# Patient Record
Sex: Male | Born: 1940
Health system: Southern US, Community
[De-identification: ages and names within clinical notes are randomized; demographics above are authoritative.]

## PROBLEM LIST (undated history)

## (undated) DIAGNOSIS — M1990 Unspecified osteoarthritis, unspecified site: Secondary | ICD-10-CM

## (undated) DIAGNOSIS — I951 Orthostatic hypotension: Secondary | ICD-10-CM

## (undated) DIAGNOSIS — E119 Type 2 diabetes mellitus without complications: Secondary | ICD-10-CM

## (undated) DIAGNOSIS — J449 Chronic obstructive pulmonary disease, unspecified: Secondary | ICD-10-CM

## (undated) DIAGNOSIS — N189 Chronic kidney disease, unspecified: Secondary | ICD-10-CM

## (undated) DIAGNOSIS — G4730 Sleep apnea, unspecified: Secondary | ICD-10-CM

## (undated) DIAGNOSIS — R1310 Dysphagia, unspecified: Secondary | ICD-10-CM

## (undated) DIAGNOSIS — K219 Gastro-esophageal reflux disease without esophagitis: Secondary | ICD-10-CM

## (undated) DIAGNOSIS — T7840XA Allergy, unspecified, initial encounter: Secondary | ICD-10-CM

## (undated) DIAGNOSIS — M549 Dorsalgia, unspecified: Secondary | ICD-10-CM

## (undated) DIAGNOSIS — K2270 Barrett's esophagus without dysplasia: Secondary | ICD-10-CM

## (undated) DIAGNOSIS — I639 Cerebral infarction, unspecified: Secondary | ICD-10-CM

## (undated) DIAGNOSIS — N200 Calculus of kidney: Secondary | ICD-10-CM

## (undated) DIAGNOSIS — E785 Hyperlipidemia, unspecified: Secondary | ICD-10-CM

## (undated) DIAGNOSIS — N400 Enlarged prostate without lower urinary tract symptoms: Secondary | ICD-10-CM

## (undated) DIAGNOSIS — R7989 Other specified abnormal findings of blood chemistry: Secondary | ICD-10-CM

## (undated) DIAGNOSIS — E538 Deficiency of other specified B group vitamins: Secondary | ICD-10-CM

## (undated) DIAGNOSIS — I10 Essential (primary) hypertension: Secondary | ICD-10-CM

## (undated) DIAGNOSIS — F32A Depression, unspecified: Secondary | ICD-10-CM

## (undated) DIAGNOSIS — Z972 Presence of dental prosthetic device (complete) (partial): Secondary | ICD-10-CM

## (undated) DIAGNOSIS — D649 Anemia, unspecified: Secondary | ICD-10-CM

## (undated) DIAGNOSIS — I00 Rheumatic fever without heart involvement: Secondary | ICD-10-CM

## (undated) DIAGNOSIS — E669 Obesity, unspecified: Secondary | ICD-10-CM

## (undated) DIAGNOSIS — I509 Heart failure, unspecified: Secondary | ICD-10-CM

## (undated) DIAGNOSIS — R011 Cardiac murmur, unspecified: Secondary | ICD-10-CM

## (undated) DIAGNOSIS — R42 Dizziness and giddiness: Secondary | ICD-10-CM

## (undated) DIAGNOSIS — N3281 Overactive bladder: Secondary | ICD-10-CM

## (undated) DIAGNOSIS — K5792 Diverticulitis of intestine, part unspecified, without perforation or abscess without bleeding: Secondary | ICD-10-CM

## (undated) DIAGNOSIS — F329 Major depressive disorder, single episode, unspecified: Secondary | ICD-10-CM

## (undated) DIAGNOSIS — G4710 Hypersomnia, unspecified: Secondary | ICD-10-CM

## (undated) DIAGNOSIS — I2510 Atherosclerotic heart disease of native coronary artery without angina pectoris: Secondary | ICD-10-CM

## (undated) HISTORY — DX: Diverticulitis of intestine, part unspecified, without perforation or abscess without bleeding: K57.92

## (undated) HISTORY — DX: Benign prostatic hyperplasia without lower urinary tract symptoms: N40.0

## (undated) HISTORY — DX: Dorsalgia, unspecified: M54.9

## (undated) HISTORY — PX: ESOPHAGOGASTRODUODENOSCOPY ENDOSCOPY: SHX5814

## (undated) HISTORY — PX: TONSILLECTOMY: SUR1361

## (undated) HISTORY — PX: BACK SURGERY: SHX140

## (undated) HISTORY — PX: CERVICAL SPINE SURGERY: SHX589

## (undated) HISTORY — PX: LUMBAR SPINE SURGERY: SHX701

## (undated) HISTORY — PX: EYE SURGERY: SHX253

## (undated) HISTORY — PX: CAROTID STENT: SHX1301

## (undated) HISTORY — DX: Overactive bladder: N32.81

## (undated) HISTORY — PX: CORONARY ANGIOPLASTY: SHX604

## (undated) HISTORY — PX: COLONOSCOPY: SHX174

## (undated) HISTORY — PX: THORACIC SPINE SURGERY: SHX802

## (undated) HISTORY — DX: Allergy, unspecified, initial encounter: T78.40XA

## (undated) HISTORY — DX: Type 2 diabetes mellitus without complications: E11.9

## (undated) SURGERY — Surgical Case
Anesthesia: *Unknown

---

## 2001-01-08 DIAGNOSIS — Z8719 Personal history of other diseases of the digestive system: Secondary | ICD-10-CM | POA: Insufficient documentation

## 2008-11-12 ENCOUNTER — Ambulatory Visit: Payer: Self-pay | Admitting: Family Medicine

## 2008-11-17 ENCOUNTER — Ambulatory Visit: Payer: Self-pay | Admitting: Family Medicine

## 2008-12-21 ENCOUNTER — Ambulatory Visit: Payer: Self-pay | Admitting: Family Medicine

## 2009-01-08 ENCOUNTER — Ambulatory Visit: Payer: Self-pay | Admitting: Family Medicine

## 2009-02-08 ENCOUNTER — Ambulatory Visit: Payer: Self-pay | Admitting: Family Medicine

## 2009-03-08 ENCOUNTER — Ambulatory Visit: Payer: Self-pay | Admitting: Family Medicine

## 2009-08-08 ENCOUNTER — Ambulatory Visit: Payer: Self-pay | Admitting: Unknown Physician Specialty

## 2009-08-10 LAB — PATHOLOGY REPORT

## 2010-02-06 ENCOUNTER — Encounter: Payer: Self-pay | Admitting: Vascular Surgery

## 2010-02-08 ENCOUNTER — Encounter: Payer: Self-pay | Admitting: Vascular Surgery

## 2010-03-09 ENCOUNTER — Encounter: Payer: Self-pay | Admitting: Vascular Surgery

## 2010-04-14 ENCOUNTER — Emergency Department: Payer: Self-pay | Admitting: Emergency Medicine

## 2010-06-13 ENCOUNTER — Ambulatory Visit: Payer: Self-pay | Admitting: Unknown Physician Specialty

## 2010-06-15 LAB — PATHOLOGY REPORT

## 2010-09-14 ENCOUNTER — Ambulatory Visit: Payer: Self-pay | Admitting: Specialist

## 2010-09-25 ENCOUNTER — Ambulatory Visit: Payer: Self-pay | Admitting: Specialist

## 2010-12-15 ENCOUNTER — Ambulatory Visit: Payer: Self-pay | Admitting: Urology

## 2011-09-24 ENCOUNTER — Ambulatory Visit: Payer: Self-pay | Admitting: Neurology

## 2012-11-28 ENCOUNTER — Ambulatory Visit: Payer: Self-pay | Admitting: Unknown Physician Specialty

## 2013-04-28 ENCOUNTER — Ambulatory Visit: Payer: Self-pay | Admitting: Urology

## 2013-06-18 DIAGNOSIS — G4730 Sleep apnea, unspecified: Secondary | ICD-10-CM | POA: Insufficient documentation

## 2013-07-03 DIAGNOSIS — E785 Hyperlipidemia, unspecified: Secondary | ICD-10-CM | POA: Insufficient documentation

## 2013-07-03 DIAGNOSIS — I10 Essential (primary) hypertension: Secondary | ICD-10-CM | POA: Insufficient documentation

## 2013-07-03 DIAGNOSIS — R0602 Shortness of breath: Secondary | ICD-10-CM | POA: Insufficient documentation

## 2013-07-03 DIAGNOSIS — M1710 Unilateral primary osteoarthritis, unspecified knee: Secondary | ICD-10-CM | POA: Insufficient documentation

## 2013-07-03 DIAGNOSIS — I2510 Atherosclerotic heart disease of native coronary artery without angina pectoris: Secondary | ICD-10-CM | POA: Insufficient documentation

## 2013-07-03 DIAGNOSIS — E1140 Type 2 diabetes mellitus with diabetic neuropathy, unspecified: Secondary | ICD-10-CM | POA: Insufficient documentation

## 2013-07-03 DIAGNOSIS — E119 Type 2 diabetes mellitus without complications: Secondary | ICD-10-CM | POA: Insufficient documentation

## 2013-09-08 ENCOUNTER — Encounter: Payer: Self-pay | Admitting: Neurology

## 2013-11-19 ENCOUNTER — Ambulatory Visit: Payer: Self-pay | Admitting: Neurology

## 2014-01-04 ENCOUNTER — Emergency Department: Payer: Self-pay | Admitting: Emergency Medicine

## 2014-01-04 LAB — COMPREHENSIVE METABOLIC PANEL
ALK PHOS: 52 U/L
ALT: 28 U/L
Albumin: 3.9 g/dL (ref 3.4–5.0)
Anion Gap: 7 (ref 7–16)
BILIRUBIN TOTAL: 0.6 mg/dL (ref 0.2–1.0)
BUN: 17 mg/dL (ref 7–18)
CHLORIDE: 101 mmol/L (ref 98–107)
CO2: 30 mmol/L (ref 21–32)
Calcium, Total: 9 mg/dL (ref 8.5–10.1)
Creatinine: 0.97 mg/dL (ref 0.60–1.30)
EGFR (African American): >60
Glucose: 160 mg/dL — ABNORMAL HIGH (ref 65–99)
OSMOLALITY: 281 (ref 275–301)
Potassium: 4.2 mmol/L (ref 3.5–5.1)
SGOT(AST): 25 U/L (ref 15–37)
Sodium: 138 mmol/L (ref 136–145)
Total Protein: 7.3 g/dL (ref 6.4–8.2)

## 2014-01-04 LAB — URINALYSIS, COMPLETE
BACTERIA: NONE SEEN
Bilirubin,UR: NEGATIVE
Blood: NEGATIVE
GLUCOSE, UR: NEGATIVE mg/dL (ref 0–75)
Ketone: NEGATIVE
LEUKOCYTE ESTERASE: NEGATIVE
Nitrite: NEGATIVE
PROTEIN: NEGATIVE
Ph: 6 (ref 4.5–8.0)
RBC,UR: 1 /HPF (ref 0–5)
Specific Gravity: 1.054 (ref 1.003–1.030)
Squamous Epithelial: NONE SEEN
WBC UR: <1 /HPF (ref 0–5)

## 2014-01-04 LAB — CBC
HCT: 43.9 % (ref 40.0–52.0)
HGB: 14.4 g/dL (ref 13.0–18.0)
MCH: 31.9 pg (ref 26.0–34.0)
MCHC: 32.8 g/dL (ref 32.0–36.0)
MCV: 97 fL (ref 80–100)
Platelet: 161 10*3/uL (ref 150–440)
RBC: 4.51 10*6/uL (ref 4.40–5.90)
RDW: 14.1 % (ref 11.5–14.5)
WBC: 8.9 10*3/uL (ref 3.8–10.6)

## 2014-01-04 LAB — TROPONIN I: Troponin-I: 0.03 ng/mL

## 2014-04-16 ENCOUNTER — Emergency Department
Admit: 2014-04-16 | Disposition: A | Discharge status: Home / Self Care | Payer: Self-pay | Admitting: Emergency Medicine

## 2014-04-21 ENCOUNTER — Emergency Department
Admit: 2014-04-21 | Disposition: A | Discharge status: Home / Self Care | Payer: Self-pay | Admitting: Emergency Medicine

## 2014-05-05 DIAGNOSIS — R2681 Unsteadiness on feet: Secondary | ICD-10-CM | POA: Insufficient documentation

## 2014-07-14 ENCOUNTER — Encounter: Payer: Self-pay | Admitting: Urology

## 2014-07-14 ENCOUNTER — Ambulatory Visit (INDEPENDENT_AMBULATORY_CARE_PROVIDER_SITE_OTHER): Payer: Medicare Other | Admitting: Urology

## 2014-07-14 VITALS — BP 123/76 | HR 72 | Resp 18 | Ht 66.5 in | Wt 286.8 lb

## 2014-07-14 DIAGNOSIS — F329 Major depressive disorder, single episode, unspecified: Secondary | ICD-10-CM | POA: Insufficient documentation

## 2014-07-14 DIAGNOSIS — F32A Depression, unspecified: Secondary | ICD-10-CM | POA: Insufficient documentation

## 2014-07-14 DIAGNOSIS — N189 Chronic kidney disease, unspecified: Secondary | ICD-10-CM | POA: Insufficient documentation

## 2014-07-14 DIAGNOSIS — N3281 Overactive bladder: Secondary | ICD-10-CM

## 2014-07-14 LAB — URINALYSIS, COMPLETE
BILIRUBIN UA: NEGATIVE
GLUCOSE, UA: NEGATIVE
Ketones, UA: NEGATIVE
Leukocytes, UA: NEGATIVE
NITRITE UA: NEGATIVE
PH UA: 7.5 (ref 5.0–7.5)
Protein, UA: NEGATIVE
RBC, UA: NEGATIVE
Specific Gravity, UA: 1.015 (ref 1.005–1.030)
UUROB: 1 mg/dL (ref 0.2–1.0)

## 2014-07-14 LAB — MICROSCOPIC EXAMINATION: BACTERIA UA: NONE SEEN

## 2014-07-14 NOTE — Progress Notes (Signed)
07/14/2014 10:24 AM   Taylor Austin Taylor Austin Aug 17, 1940 [id number]  Referring provider: No referring provider defined for this encounter.  Chief Complaint  Patient presents with  . Over Active Bladder  . Follow-up    1 month    HPI [name] is a patient who has variable episodes of incontinence and who does not really understand the fact that urgency of urination is because he has a very small capacity bladder and that he will have small amounts of urine when he goes to the bathroom every 30-40 minutes. However some days he does well. I had 2 thoughts about this situation. One is that he is drinking a lot of Ice-T and perhaps should cut down on his Ice-T and coffee intake. Secondly he may have diabetes insipidus. He has some tendencies toward type 2 diabetes as he is grossly overweight and some of his variable incontinence may be due to his polyuria diabetes or he has diabetes insipidus that has not been diagnosed yet patient had poor results with [facility] for control of his incontinence but had great results with Vesicare. His insurance wouldn't pay for the Vesicare so he switched to the drug they would pay for which was [place]. He didn't really do well on that drug in regards to his incontinence. So I placed him while samples of Toviaz 4 mg daily and see him in a month to 6 weeks to see how he did on to this. If he doesn't do well on drugs then I think it's time for him to consider alternative therapy such as posterior tibial nerve stimulation Botox or InterStim therapy. Secondary oh.   PMH: Past Medical History  Diagnosis Date  . OAB (overactive bladder)   . Back pain   . Allergy   . Diabetes mellitus without complication   . Diverticulitis     Surgical History: Past Surgical History  Procedure Laterality Date  . Lumbar spine surgery    . Cervical spine surgery    . Thoracic spine surgery    . Colonoscopy    . Esophagogastroduodenoscopy endoscopy      Home Medications:      Medication List       This list is accurate as of: 07/14/14 10:24 AM.  Always use your most recent med list.               diazepam 5 MG tablet  Commonly known as:  VALIUM     finasteride 5 MG tablet  Commonly known as:  PROSCAR     fluticasone 50 MCG/ACT nasal spray  Commonly known as:  FLONASE  Place into the nose.     furosemide 40 MG tablet  Commonly known as:  LASIX     glyBURIDE-metformin 5-500 MG per tablet  Commonly known as:  GLUCOVANCE  TAKE ONE TABLET BY MOUTH TWICE DAILY WITH MEALS     ketorolac 10 MG tablet  Commonly known as:  TORADOL     losartan-hydrochlorothiazide 100-25 MG per tablet  Commonly known as:  HYZAAR     methocarbamol 500 MG tablet  Commonly known as:  ROBAXIN     metoprolol succinate 25 MG 24 hr tablet  Commonly known as:  TOPROL-XL  TAKE ONE TABLET BY MOUTH ONCE DAILY     MULTI-VITAMINS Tabs  Take by mouth.     omeprazole 10 MG capsule  Commonly known as:  PRILOSEC  Take by mouth.     pioglitazone 45 MG tablet  Commonly known as:  ACTOS     simvastatin 40 MG tablet  Commonly known as:  ZOCOR        Allergies:  Allergies  Allergen Reactions  . Hydrocodone Shortness Of Breath  . Oxycodone-Acetaminophen Shortness Of Breath    Family History: Family History  Problem Relation Age of Onset  . Stroke Father   . Anesthesia problems Father   . Anesthesia problems Mother   . Prostate cancer Brother     Social History:  reports that he quit smoking about 41 years ago. His smoking use included Cigarettes. He does not have any smokeless tobacco history on file. He reports that he drinks about 1.2 oz of alcohol per week. His drug history is not on file.  ROS: UROLOGY Frequent Urination?: Yes Hard to postpone urination?: Yes Burning/pain with urination?: No Get up at night to urinate?: Yes Leakage of urine?: Yes Urine stream starts and stops?: Yes Trouble starting stream?: Yes Do you have to strain to urinate?:  No Blood in urine?: No Urinary tract infection?: No Sexually transmitted disease?: No Injury to kidneys or bladder?: No Painful intercourse?: No Weak stream?: Yes Erection problems?: Yes Penile pain?: No Gastrointestinal Nausea?: No Vomiting?: No Indigestion/heartburn?: No Diarrhea?: No Constipation?: No Constitutional Fever: No Night sweats?: No Weight loss?: No Fatigue?: Yes Skin Skin rash/lesions?: No Itching?: No Eyes Blurred vision?: No Double vision?: No Ears/Nose/Throat Sore throat?: No Sinus problems?: No Hematologic/Lymphatic Swollen glands?: No Easy bruising?: No Cardiovascular Leg swelling?: No Chest pain?: No Respiratory Cough?: No Shortness of breath?: Yes Endocrine Excessive thirst?: No Musculoskeletal Back pain?: No Joint pain?: No Neurological Headaches?: No Dizziness?: No Psychologic Depression?: No Anxiety?: No   Physical Exam: BP 123/76 mmHg  Pulse 72  Resp 18  Ht 5' 6.5" (1.689 m)  Wt 286 lb 12.8 oz (130.092 kg)  BMI 45.60 kg/m2  Constitutional:  Alert and oriented, No acute distress. HEENT: [place] AT, moist mucus membranes.  Trachea midline, no masses. Cardiovascular: No clubbing, cyanosis, or edema. Respiratory: Normal respiratory effort, no increased work of breathing. GI: Abdomen is soft, nontender, nondistended, no abdominal masses GU: No CVA tenderness. Penis normal testes normal prostate small firm nonnodular Skin: No rashes, bruises or suspicious lesions. Lymph: No cervical or inguinal adenopathy. Neurologic: Grossly intact, no focal deficits, moving all 4 extremities. Psychiatric: Normal mood and affect.  Laboratory Data: Lab Results  Component Value Date   WBC 8.9 01/04/2014   HGB 14.4 01/04/2014   HCT 43.9 01/04/2014   MCV 97 01/04/2014   PLT 161 01/04/2014    Lab Results  Component Value Date   CREATININE 0.97 01/04/2014    No results found for: PSA  No results found for: TESTOSTERONE  No results found  for: HGBA1C  Urinalysis No results found for: COLORURINE, APPEARANCEUR, LABSPEC, PHURINE, GLUCOSEU, HGBUR, BILIRUBINUR, KETONESUR, PROTEINUR, UROBILINOGEN, NITRITE, LEUKOCYTESUR  Pertinent Imaging: None  Assessment & Plan: Overactive bladder and need for change in medication and dietary discretion including decreased caffeinated beverages. Patient given samples of Toviaz   1. OAB (overactive bladder)  - Urinalysis, Complete   No Follow-up on file.  Collier Flowers, [facility] [address] [place], [place] [address] [phone]   Ernst Bowler

## 2014-08-11 ENCOUNTER — Encounter
Admission: RE | Disposition: A | Discharge status: Home / Self Care | Payer: Self-pay | Source: Ambulatory Visit | Attending: Cardiology

## 2014-08-11 ENCOUNTER — Encounter: Payer: Self-pay | Admitting: *Deleted

## 2014-08-11 ENCOUNTER — Ambulatory Visit
Admission: RE | Admit: 2014-08-11 | Discharge: 2014-08-11 | Disposition: A | Discharge status: Home / Self Care | Payer: Medicare Other | Source: Ambulatory Visit | Attending: Cardiology | Admitting: Cardiology

## 2014-08-11 DIAGNOSIS — Z87891 Personal history of nicotine dependence: Secondary | ICD-10-CM | POA: Insufficient documentation

## 2014-08-11 DIAGNOSIS — Z87442 Personal history of urinary calculi: Secondary | ICD-10-CM | POA: Insufficient documentation

## 2014-08-11 DIAGNOSIS — I2510 Atherosclerotic heart disease of native coronary artery without angina pectoris: Secondary | ICD-10-CM | POA: Diagnosis present

## 2014-08-11 DIAGNOSIS — R9439 Abnormal result of other cardiovascular function study: Secondary | ICD-10-CM | POA: Diagnosis not present

## 2014-08-11 DIAGNOSIS — Z79899 Other long term (current) drug therapy: Secondary | ICD-10-CM | POA: Insufficient documentation

## 2014-08-11 DIAGNOSIS — Z7951 Long term (current) use of inhaled steroids: Secondary | ICD-10-CM | POA: Diagnosis not present

## 2014-08-11 DIAGNOSIS — Z8249 Family history of ischemic heart disease and other diseases of the circulatory system: Secondary | ICD-10-CM | POA: Insufficient documentation

## 2014-08-11 DIAGNOSIS — Z833 Family history of diabetes mellitus: Secondary | ICD-10-CM | POA: Diagnosis not present

## 2014-08-11 DIAGNOSIS — R0602 Shortness of breath: Secondary | ICD-10-CM | POA: Insufficient documentation

## 2014-08-11 DIAGNOSIS — K219 Gastro-esophageal reflux disease without esophagitis: Secondary | ICD-10-CM | POA: Insufficient documentation

## 2014-08-11 DIAGNOSIS — N189 Chronic kidney disease, unspecified: Secondary | ICD-10-CM | POA: Insufficient documentation

## 2014-08-11 DIAGNOSIS — I129 Hypertensive chronic kidney disease with stage 1 through stage 4 chronic kidney disease, or unspecified chronic kidney disease: Secondary | ICD-10-CM | POA: Diagnosis not present

## 2014-08-11 DIAGNOSIS — Z8042 Family history of malignant neoplasm of prostate: Secondary | ICD-10-CM | POA: Insufficient documentation

## 2014-08-11 DIAGNOSIS — Z955 Presence of coronary angioplasty implant and graft: Secondary | ICD-10-CM | POA: Diagnosis not present

## 2014-08-11 DIAGNOSIS — E785 Hyperlipidemia, unspecified: Secondary | ICD-10-CM | POA: Diagnosis not present

## 2014-08-11 DIAGNOSIS — R0789 Other chest pain: Secondary | ICD-10-CM | POA: Insufficient documentation

## 2014-08-11 DIAGNOSIS — E1122 Type 2 diabetes mellitus with diabetic chronic kidney disease: Secondary | ICD-10-CM | POA: Insufficient documentation

## 2014-08-11 DIAGNOSIS — Z885 Allergy status to narcotic agent status: Secondary | ICD-10-CM | POA: Diagnosis not present

## 2014-08-11 DIAGNOSIS — G4730 Sleep apnea, unspecified: Secondary | ICD-10-CM | POA: Insufficient documentation

## 2014-08-11 DIAGNOSIS — F329 Major depressive disorder, single episode, unspecified: Secondary | ICD-10-CM | POA: Insufficient documentation

## 2014-08-11 DIAGNOSIS — Z823 Family history of stroke: Secondary | ICD-10-CM | POA: Diagnosis not present

## 2014-08-11 HISTORY — DX: Essential (primary) hypertension: I10

## 2014-08-11 HISTORY — DX: Unspecified osteoarthritis, unspecified site: M19.90

## 2014-08-11 HISTORY — DX: Hyperlipidemia, unspecified: E78.5

## 2014-08-11 HISTORY — DX: Rheumatic fever without heart involvement: I00

## 2014-08-11 HISTORY — DX: Gastro-esophageal reflux disease without esophagitis: K21.9

## 2014-08-11 HISTORY — DX: Hypersomnia, unspecified: G47.10

## 2014-08-11 HISTORY — DX: Other specified abnormal findings of blood chemistry: R79.89

## 2014-08-11 HISTORY — PX: CARDIAC CATHETERIZATION: SHX172

## 2014-08-11 HISTORY — DX: Atherosclerotic heart disease of native coronary artery without angina pectoris: I25.10

## 2014-08-11 HISTORY — DX: Depression, unspecified: F32.A

## 2014-08-11 HISTORY — DX: Deficiency of other specified B group vitamins: E53.8

## 2014-08-11 HISTORY — DX: Calculus of kidney: N20.0

## 2014-08-11 HISTORY — DX: Major depressive disorder, single episode, unspecified: F32.9

## 2014-08-11 HISTORY — DX: Chronic kidney disease, unspecified: N18.9

## 2014-08-11 HISTORY — DX: Barrett's esophagus without dysplasia: K22.70

## 2014-08-11 HISTORY — DX: Sleep apnea, unspecified: G47.30

## 2014-08-11 SURGERY — LEFT HEART CATH AND CORONARY ANGIOGRAPHY

## 2014-08-11 MED ORDER — SODIUM CHLORIDE 0.9 % IJ SOLN: 3.0000 mL | Freq: Two times a day (BID) | INTRAMUSCULAR | Status: DC

## 2014-08-11 MED ORDER — SODIUM CHLORIDE 0.9 % IJ SOLN: 3.0000 mL | INTRAMUSCULAR | Status: DC | PRN

## 2014-08-11 MED ORDER — MIDAZOLAM HCL 2 MG/2ML IJ SOLN
INTRAMUSCULAR | Status: AC
Start: 2014-08-11 — End: 2014-08-11
  Filled 2014-08-11: qty 2

## 2014-08-11 MED ORDER — SODIUM CHLORIDE 0.9 % IV SOLN: 250.0000 mL | INTRAVENOUS | Status: DC | PRN

## 2014-08-11 MED ORDER — HEPARIN (PORCINE) IN NACL 2-0.9 UNIT/ML-% IJ SOLN
INTRAMUSCULAR | Status: AC
  Filled 2014-08-11: qty 1000

## 2014-08-11 MED ORDER — METHYLPREDNISOLONE SODIUM SUCC 125 MG IJ SOLR
INTRAMUSCULAR | Status: AC
  Filled 2014-08-11: qty 2

## 2014-08-11 MED ORDER — FENTANYL CITRATE (PF) 100 MCG/2ML IJ SOLN
INTRAMUSCULAR | Status: AC
  Filled 2014-08-11: qty 2

## 2014-08-11 MED ORDER — MIDAZOLAM HCL 2 MG/2ML IJ SOLN
INTRAMUSCULAR | Status: DC | PRN
  Administered 2014-08-11 (×2): 1 mg via INTRAVENOUS

## 2014-08-11 MED ORDER — IOHEXOL 300 MG/ML  SOLN
INTRAMUSCULAR | Status: DC | PRN
  Administered 2014-08-11: 120 mL via INTRA_ARTERIAL

## 2014-08-11 MED ORDER — FENTANYL CITRATE (PF) 100 MCG/2ML IJ SOLN
INTRAMUSCULAR | Status: DC | PRN
  Administered 2014-08-11 (×3): 25 ug via INTRAVENOUS

## 2014-08-11 MED ORDER — SODIUM CHLORIDE 0.9 % WEIGHT BASED INFUSION: 3.0000 mL/kg/h | INTRAVENOUS | Status: DC

## 2014-08-11 MED ORDER — SODIUM CHLORIDE 0.9 % IV SOLN
INTRAVENOUS | Status: DC
  Administered 2014-08-11: 08:00:00 via INTRAVENOUS

## 2014-08-11 MED ORDER — FAMOTIDINE 20 MG PO TABS
ORAL_TABLET | ORAL | Status: AC
  Filled 2014-08-11: qty 1

## 2014-08-11 SURGICAL SUPPLY — 15 items
CATH INFINITI 5 FR 3DRC (CATHETERS) ×3 IMPLANT
CATH INFINITI 5FR ANG PIGTAIL (CATHETERS) ×3 IMPLANT
CATH INFINITI 5FR JL4 (CATHETERS) ×3 IMPLANT
CATH INFINITI 5FR JL5 (CATHETERS) ×3 IMPLANT
CATH INFINITI JR4 5F (CATHETERS) ×3 IMPLANT
CATH SWANZ 7F THERMO (CATHETERS) IMPLANT
DEVICE CLOSURE MYNXGRIP 5F (Vascular Products) ×3 IMPLANT
KIT MANI 3VAL PERCEP (MISCELLANEOUS) ×3 IMPLANT
KIT RIGHT HEART (MISCELLANEOUS) IMPLANT
NEEDLE PERC 18GX7CM (NEEDLE) ×3 IMPLANT
PACK CARDIAC CATH (CUSTOM PROCEDURE TRAY) ×3 IMPLANT
SHEATH AVANTI 5FR X 11CM (SHEATH) ×3 IMPLANT
SHEATH PINNACLE 7F 10CM (SHEATH) IMPLANT
WIRE EMERALD 3MM-J .035X150CM (WIRE) ×3 IMPLANT
WIRE HITORQ VERSACORE ST 145CM (WIRE) ×3 IMPLANT

## 2014-08-11 NOTE — H&P (Signed)
Chief Complaint  Patient presents with  . Follow-up  3 months  . Shortness of Breath  I get that alot  . Mobility/balance Issues  it is getting worse I see Dr Manuella Ghazi  Date of Service: 08/03/2014 Date of Birth: [birth year] PCP: Taylor Brandy Hale, Taylor Austin  History of Present Illness: Taylor Austin is a 74 y.o.male patient who returns for follow-up visit. Has a history of sleep apnea on BiPAP, history of diabetes mellitus, hyperlipidemia and coronary artery disease status post PCI. He has been having progressive shortness of breath weakness fatigue and chest discomfort. He had a functional study in January, 2016 which revealed probable anteroseptal ischemia. He was treated medically initially but his symptoms have progressed to the point where he has to limites activity. Risk and benefits of left cardiac catheterization were explained. Patient has a high risk functional study evidence of angina with [place] class 3 angina.  Past Medical and Surgical History  Past Medical History Past Medical History  Diagnosis Date  . Sleep apnea  on BiPAP  . Diabetes mellitus type 2, uncomplicated  . Hyperlipidemia  . Coronary artery disease  status post stenting in 2004  . History of Barrett's esophagus 2003  History of Barrett's esophagus. Last EGD August 2011  . History of rheumatic fever 1961  . Arthritis of knee  . Vitamin B12 deficiency  . Low testosterone  . Esophageal reflux  . Dysphagia, unspecified(787.20)  . Essential hypertension, benign  . Hypersomnia with sleep apnea, unspecified  . Chronic kidney disease  kidney stones  . Depression   Past Surgical History He has past surgical history that includes Back surgery (2006); Carotid stent; Colonoscopy (06/13/2010); egd (06/03/2003); Tonsillectomy; Coronary angioplasty; egd (08/08/2009); and egd (11/28/2012).   Medications and Allergies  Current Medications  Current Outpatient Prescriptions  Medication Sig Dispense Refill  . fesoterodine  (TOVIAZ) 4 mg ER tablet Take by mouth.  . finasteride (PROSCAR) 5 mg tablet Take 5 mg by mouth once daily.  . fluticasone (FLONASE) 50 mcg/actuation nasal spray Place 2 sprays into both nostrils once daily.  Marland Kitchen glyBURIDE-metFORMIN (GLUCOVANCE) 5-500 mg tablet TAKE ONE TABLET BY MOUTH TWICE DAILY WITH MEALS 180 tablet 1  . losartan-hydrochlorothiazide (HYZAAR) 100-25 mg tablet Take 1 tablet by mouth once daily. 30 tablet 11  . metoprolol succinate (TOPROL-XL) 25 MG XL tablet TAKE ONE TABLET BY MOUTH ONCE DAILY 90 tablet 1  . multivitamin tablet Take 1 tablet by mouth once daily.  Marland Kitchen omeprazole (PRILOSEC) 10 MG DR capsule Take 20 mg by mouth once daily.  Marland Kitchen oxymetazoline (AFRIN, OXYMETAZOLINE,) 0.05 % nasal spray Place into both nostrils 2 (two) times daily.  . pioglitazone (ACTOS) 45 MG tablet TAKE ONE-HALF TABLET BY MOUTH TWICE DAILY 90 tablet 1  . simvastatin (ZOCOR) 40 MG tablet TAKE ONE TABLET BY MOUTH ONCE DAILY FOR CHOLESTEROL 30 tablet 5  . blood glucose diagnostic (CONTOUR TEST STRIPS) test strip Use 1 strip via meter once a day [250.00]  . blood glucose diagnostic (CONTOUR TEST STRIPS) test strip Use once daily. 100 each 3  . FUROsemide (LASIX) 40 MG tablet Take 1 tablet (40 mg total) by mouth once daily. 30 tablet 11   No current facility-administered medications for this visit.   Allergies: Hydrocodone hcl and Percocet  Social and Family History  Social History reports that he quit smoking about 41 years ago. He has never used smokeless tobacco. He reports that he drinks alcohol. He reports that he does not use illicit drugs.  Family  History Family History  Problem Relation Age of Onset  . Hypertension Mother  . Diabetes type II Mother  . Diabetes mellitus Mother  . Mental illness Mother  . Stroke Mother  . Stroke Father  . Prostate cancer Brother   Review of Systems  Review of Systems  Constitutional: Negative for fever, chills, weight loss, malaise/fatigue and  diaphoresis.  HENT: Negative for congestion, ear discharge, hearing loss and tinnitus.  Eyes: Negative for blurred vision.  Respiratory: Positive for shortness of breath. Negative for cough, hemoptysis, sputum production and wheezing.  Cardiovascular: Positive for chest pain and leg swelling. Negative for palpitations, orthopnea, claudication and PND.  Gastrointestinal: Negative for heartburn, nausea, vomiting, abdominal pain, diarrhea, constipation, blood in stool and melena.  Genitourinary: Negative for dysuria, urgency, frequency and hematuria.  Musculoskeletal: Positive for back pain. Negative for myalgias, joint pain and falls.  Skin: Negative for itching and rash.  Neurological: Negative for dizziness, tingling, focal weakness, loss of consciousness, weakness and headaches.  Endo/Heme/Allergies: Negative for polydipsia. Does not bruise/bleed easily.  Psychiatric/Behavioral: Negative for depression, memory loss and substance abuse. The patient is not nervous/anxious.    Physical Examination   Vitals:BP 122/70 mmHg  Pulse 68  Resp 10  Ht 168.9 cm (5' 6.5")  Wt 131.543 kg (290 lb)  BMI 46.11 kg/m2 Ht:168.9 cm (5' 6.5") Wt:131.543 kg (290 lb) INO:MVEH surface area is 2.48 meters squared. Body mass index is 46.11 kg/(m^2).  Wt Readings from Last 3 Encounters:  08/03/14 131.543 kg (290 lb)  06/17/14 128.822 kg (284 lb)  05/05/14 130.182 kg (287 lb)   BP Readings from Last 3 Encounters:  08/03/14 122/70  06/17/14 130/71  05/05/14 130/78   General appearance appears in no acute distress  Head Mouth and Eye exam Normocephalic, without obvious abnormality, atraumatic Dentition is good Eyes appear anicteric   Neck exam Thyroid: normal  Nodes: no obvious adenopathy  LUNGS Breath Sounds: Normal Percussion: Normal  CARDIOVASCULAR JVP CV wave: no HJR: no Elevation at 90 degrees: None Carotid Pulse: normal pulsation bilaterally Bruit: None Apex: apical impulse  normal  Auscultation Rhythm: normal sinus rhythm S1: normal S2: normal Clicks: no Rub: no Murmurs: 1/6 medium pitched mid systolic blowing at lower left sternal border  Gallop: None ABDOMEN Liver enlargement: no Pulsatile aorta: no Ascites: no Bruits: no  EXTREMITIES Clubbing: no Edema: 3+ bilateral pedal edema Pulses: peripheral pulses symmetrical Femoral Bruits: no Amputation: no SKIN Rash: no Cyanosis: no Embolic phemonenon: no Bruising: no NEURO Alert and Oriented to person, place and time: yes Non focal: yes  PSYCH: Pt appears to have normal affect  LABS REVIEWED Last 3 CBC results: Lab Results  Component Value Date  WBC 5.2 08/03/2014  WBC 5.6 04/20/2014  WBC 6.1 01/15/2014   Lab Results  Component Value Date  HGB 13.1* 08/03/2014  HGB 13.0* 04/20/2014  HGB 14.2 01/15/2014   Lab Results  Component Value Date  HCT 38.8* 08/03/2014  HCT 37.4* 04/20/2014  HCT 41.8 01/15/2014   Lab Results  Component Value Date  PLT 163 08/03/2014  PLT 153 04/20/2014  PLT 173 01/15/2014   Lab Results  Component Value Date  CREATININE 0.9 08/03/2014  BUN 19 08/03/2014  NA 133* 08/03/2014  K 4.3 08/03/2014  CL 95* 08/03/2014  CO2 31.2 08/03/2014   Lab Results  Component Value Date  HGBA1C 6.7* 04/20/2014   Lab Results  Component Value Date  HDL 43.7 05/15/2013   Lab Results  Component Value Date  LDLCALC 66 05/15/2013  Lab Results  Component Value Date  TRIG 140 05/15/2013   Lab Results  Component Value Date  ALT 17 04/20/2014  AST 17 04/20/2014  ALKPHOS 44 04/20/2014   Assessment and Plan   74 y.o. male with  ICD-10-CM ICD-9-CM  1. Coronary artery disease involving native coronary artery of native heart without angina pectoris-t patient has progressive chest pain and fatigue with activity. Functional study showed probable antral septal ischemia and in January. Attempt at medical therapy resulted in progressive symptoms. Risk and benefits  a left heart catheterization her explain to the patient agrees to proceed. I25.10 414.01  2. Essential hypertension, benign blood pressure is being treated with losartan-hydrochlorothiazide and metoprolol. Will continue with this regimen recommend a dash diet I10 401.1  3. Hyperlipidemia, unspecified hyperlipidemia type-Will continue with simvastatin at 40 mg daily with an LDL goal of less than 100 E78.5 272.4  4. Diabetes mellitus type 2, uncomplicated H29.9 242.68  5. Shortness of breath - etiologies unclear. Likely multifactorial however given weight gain will treat with furosemide 40 mg daily . Will continue with this as is weight is improving. is recommended R06.02 786.05  6. Sleep apnea, unspecified type-compliance with BiPAP a weight loss is recommended G47.30 780.57  7. Acute edema the-as per above R60.9 782.3   Return in about 2 weeks (around 08/17/2014).  These notes generated with voice recognition software. I apologize for typographical errors.  Sydnee Levans, Taylor Austin

## 2014-08-11 NOTE — Discharge Instructions (Addendum)

## 2014-08-11 NOTE — Discharge Summary (Signed)
  Pt underwent left heart cath with no complications. Normal lv function and normal coronary arteries

## 2014-08-12 ENCOUNTER — Encounter: Payer: Self-pay | Admitting: Cardiology

## 2014-08-31 ENCOUNTER — Ambulatory Visit (INDEPENDENT_AMBULATORY_CARE_PROVIDER_SITE_OTHER): Payer: Medicare Other | Admitting: Urology

## 2014-08-31 ENCOUNTER — Encounter: Payer: Self-pay | Admitting: Urology

## 2014-08-31 VITALS — BP 128/55 | HR 72 | Ht 66.0 in | Wt 287.3 lb

## 2014-08-31 DIAGNOSIS — N3281 Overactive bladder: Secondary | ICD-10-CM

## 2014-08-31 DIAGNOSIS — N400 Enlarged prostate without lower urinary tract symptoms: Secondary | ICD-10-CM | POA: Diagnosis not present

## 2014-08-31 LAB — URINALYSIS, COMPLETE
Bilirubin, UA: NEGATIVE
Glucose, UA: NEGATIVE
Ketones, UA: NEGATIVE
LEUKOCYTES UA: NEGATIVE
Nitrite, UA: NEGATIVE
PH UA: 7.5 (ref 5.0–7.5)
PROTEIN UA: NEGATIVE
RBC, UA: NEGATIVE
Specific Gravity, UA: 1.015 (ref 1.005–1.030)
Urobilinogen, Ur: 1 mg/dL (ref 0.2–1.0)

## 2014-08-31 LAB — MICROSCOPIC EXAMINATION
BACTERIA UA: NONE SEEN
EPITHELIAL CELLS (NON RENAL): NONE SEEN /HPF (ref 0–10)
RBC, UA: NONE SEEN /hpf (ref 0–?)
WBC, UA: NONE SEEN /hpf (ref 0–?)

## 2014-08-31 NOTE — Progress Notes (Signed)
08/31/2014 10:12 AM   Taylor Austin [birth year] [id number]  Referring provider: No referring provider defined for this encounter.  Chief Complaint  Patient presents with  . Over Active Bladder    follow up    HPI: Patient presents with confusing symptoms of improvement in his urination in the frequency. But he has hesitancy and only goes small amounts. However this is probably related to his excessive intake of iced tea he has no bleeding dysuria and his frequency is less. He just is going in smaller amounts irritative in nature. He has the irritation from the Ice-T and then since he is not drinking as much water dehydrated and he doesn't have a urine. We have discussed this with him but I don't think he really understands what I talked about. HPI   PMH: Past Medical History  Diagnosis Date  . OAB (overactive bladder)   . Back pain   . Allergy   . Diabetes mellitus without complication   . Diverticulitis   . Sleep apnea   . Hyperlipidemia   . Coronary artery disease   . Barrett esophagus   . Rheumatic fever   . Arthritis   . Vitamin B12 deficiency   . Low testosterone   . GERD (gastroesophageal reflux disease)   . Hypertension   . Hypersomnia   . Chronic kidney disease   . Kidney stones   . Depression   . BPH (benign prostatic hyperplasia)     Surgical History: Past Surgical History  Procedure Laterality Date  . Lumbar spine surgery    . Cervical spine surgery    . Thoracic spine surgery    . Colonoscopy    . Esophagogastroduodenoscopy endoscopy    . Carotid stent    . Back surgery    . Tonsillectomy    . Coronary angioplasty    . Cardiac catheterization  08/11/2014    Procedure: Left Heart Cath and Coronary Angiography;  Surgeon: Teodoro Spray, MD;  Location: [facility];  Service: Cardiovascular;;    Home Medications:    Medication List       This list is accurate as of: 08/31/14 10:12 AM.  Always use your most recent med list.               diazepam 5 MG tablet  Commonly known as:  VALIUM     finasteride 5 MG tablet  Commonly known as:  PROSCAR  Take 5 mg by mouth daily.     fluticasone 50 MCG/ACT nasal spray  Commonly known as:  FLONASE  Place 2 sprays into both nostrils daily.     furosemide 40 MG tablet  Commonly known as:  LASIX  Take 40 mg by mouth daily.     glyBURIDE-metformin 5-500 MG per tablet  Commonly known as:  GLUCOVANCE  TAKE ONE TABLET BY MOUTH TWICE DAILY WITH MEALS     ketorolac 10 MG tablet  Commonly known as:  TORADOL     losartan-hydrochlorothiazide 100-25 MG per tablet  Commonly known as:  HYZAAR  Take 1 tablet by mouth daily.     methocarbamol 500 MG tablet  Commonly known as:  ROBAXIN     metoprolol succinate 25 MG 24 hr tablet  Commonly known as:  TOPROL-XL  TAKE ONE TABLET BY MOUTH ONCE DAILY     MULTI-VITAMINS Tabs  Take 1 tablet by mouth daily.     omeprazole 10 MG capsule  Commonly known as:  PRILOSEC  Take 20  mg by mouth daily.     oxymetazoline 0.05 % nasal spray  Commonly known as:  AFRIN  Place 1 spray into both nostrils 2 (two) times daily.     pioglitazone 45 MG tablet  Commonly known as:  ACTOS  Take 22.5 mg by mouth 2 (two) times daily.     simvastatin 40 MG tablet  Commonly known as:  ZOCOR  Take 40 mg by mouth daily at 6 PM.     TOVIAZ 4 MG Tb24 tablet  Generic drug:  fesoterodine  Take 4 mg by mouth daily.        Allergies:  Allergies  Allergen Reactions  . Hydrocodone Shortness Of Breath  . Oxycodone-Acetaminophen Shortness Of Breath    Family History: Family History  Problem Relation Age of Onset  . Stroke Father   . Anesthesia problems Father   . Anesthesia problems Mother   . Prostate cancer Brother   . Kidney disease Neg Hx     Social History:  reports that he quit smoking about 41 years ago. His smoking use included Cigarettes. He does not have any smokeless tobacco history on file. He reports that he drinks about 1.2 oz of  alcohol per week. He reports that he does not use illicit drugs.  ROS: UROLOGY Frequent Urination?: Yes Hard to postpone urination?: Yes Burning/pain with urination?: No Get up at night to urinate?: Yes Leakage of urine?: Yes Urine stream starts and stops?: Yes Trouble starting stream?: Yes Do you have to strain to urinate?: No Blood in urine?: No Urinary tract infection?: No Sexually transmitted disease?: No Injury to kidneys or bladder?: No Painful intercourse?: No Weak stream?: Yes Erection problems?: Yes Penile pain?: No  Gastrointestinal Nausea?: No Vomiting?: No Indigestion/heartburn?: No Diarrhea?: No Constipation?: No  Constitutional Fever: No Night sweats?: No Weight loss?: No Fatigue?: Yes  Skin Skin rash/lesions?: No Itching?: No  Eyes Blurred vision?: Yes Double vision?: No  Ears/Nose/Throat Sore throat?: No Sinus problems?: No  Hematologic/Lymphatic Swollen glands?: No Easy bruising?: Yes  Cardiovascular Leg swelling?: Yes Chest pain?: No  Respiratory Cough?: No Shortness of breath?: Yes  Endocrine Excessive thirst?: No  Musculoskeletal Back pain?: Yes Joint pain?: Yes  Neurological Headaches?: No Dizziness?: Yes  Psychologic Depression?: No Anxiety?: No  Physical Exam: BP 128/55 mmHg  Pulse 72  Ht 5\' 6"  (1.676 m)  Wt 287 lb 4.8 oz (130.318 kg)  BMI 46.39 kg/m2  Constitutional:  Alert and oriented, No acute distress. HEENT: [place] AT, moist mucus membranes.  Trachea midline, no masses. Cardiovascular: No clubbing, cyanosis, or edema. Respiratory: Normal respiratory effort, no increased work of breathing. GI: Abdomen is soft, nontender, nondistended, no abdominal masses GU: No CVA tenderness.  Skin: No rashes, bruises or suspicious lesions. Lymph: No cervical or inguinal adenopathy. Neurologic: Grossly intact, no focal deficits, moving all 4 extremities. Psychiatric: Normal mood and affect.  Laboratory Data: Lab  Results  Component Value Date   WBC 8.9 01/04/2014   HGB 14.4 01/04/2014   HCT 43.9 01/04/2014   MCV 97 01/04/2014   PLT 161 01/04/2014    Lab Results  Component Value Date   CREATININE 0.97 01/04/2014    No results found for: PSA  No results found for: TESTOSTERONE  No results found for: HGBA1C  Urinalysis    Component Value Date/Time   COLORURINE Yellow 01/04/2014 1645   APPEARANCEUR Clear 01/04/2014 1645   LABSPEC 1.054 01/04/2014 1645   PHURINE 6.0 01/04/2014 1645   GLUCOSEU Negative 07/14/2014 0932  GLUCOSEU Negative 01/04/2014 1645   HGBUR Negative 01/04/2014 1645   BILIRUBINUR Negative 07/14/2014 0932   BILIRUBINUR Negative 01/04/2014 1645   KETONESUR Negative 01/04/2014 1645   PROTEINUR Negative 01/04/2014 1645   NITRITE Negative 07/14/2014 0932   NITRITE Negative 01/04/2014 1645   LEUKOCYTESUR Negative 07/14/2014 0932   LEUKOCYTESUR Negative 01/04/2014 1645    Pertinent Imaging: None  Assessment and Plan: Patient now states that his overactive bladder is gone and he now has urgency with small amounts of urine. He continues to drink over  large amount of iced tea. Lisbeth Ply and Vesicare has not really helped him and I think the reason is that he has dietary indiscretion but refuses to stop he continues on finasteride for his BPH and slightly elevated PSA. His PSA has remained stable or gone down over the last several years. We've repeated his PSA today. He does have some memory loss problems. He doesn't remember very well his past medical history so it's very hard to get a cogent history from him that is contiguous his treatments and his medications so I think we'll just continue to watch him for PSA changes. Follow-up is in 6 mon      Problem List Items Addressed This Visit    None    Visit Diagnoses    OAB (overactive bladder)    -  Primary    Relevant Orders    Urinalysis, Complete    BPH (benign prostatic hyperplasia)        Relevant Orders    PSA        No Follow-up on file.  Collier Flowers, [facility] [address], [place], [place] [address] [phone]

## 2014-09-01 LAB — PSA: Prostate Specific Ag, Serum: 1.3 ng/mL (ref 0.0–4.0)

## 2014-11-17 ENCOUNTER — Ambulatory Visit
Admission: RE | Admit: 2014-11-17 | Discharge: 2014-11-17 | Disposition: A | Discharge status: Home / Self Care | Payer: Medicare Other | Source: Ambulatory Visit | Attending: Cardiology | Admitting: Cardiology

## 2014-11-17 ENCOUNTER — Encounter: Payer: Self-pay | Admitting: *Deleted

## 2014-11-17 ENCOUNTER — Ambulatory Visit: Admit: 2014-11-17 | Payer: Self-pay | Admitting: Cardiology

## 2014-11-17 ENCOUNTER — Encounter
Admission: RE | Disposition: A | Discharge status: Home / Self Care | Payer: Self-pay | Source: Ambulatory Visit | Attending: Cardiology

## 2014-11-17 DIAGNOSIS — I272 Other secondary pulmonary hypertension: Secondary | ICD-10-CM | POA: Diagnosis not present

## 2014-11-17 DIAGNOSIS — G4710 Hypersomnia, unspecified: Secondary | ICD-10-CM | POA: Insufficient documentation

## 2014-11-17 DIAGNOSIS — F329 Major depressive disorder, single episode, unspecified: Secondary | ICD-10-CM | POA: Diagnosis not present

## 2014-11-17 DIAGNOSIS — Z8249 Family history of ischemic heart disease and other diseases of the circulatory system: Secondary | ICD-10-CM | POA: Diagnosis not present

## 2014-11-17 DIAGNOSIS — I1310 Hypertensive heart and chronic kidney disease without heart failure, with stage 1 through stage 4 chronic kidney disease, or unspecified chronic kidney disease: Secondary | ICD-10-CM | POA: Diagnosis not present

## 2014-11-17 DIAGNOSIS — N189 Chronic kidney disease, unspecified: Secondary | ICD-10-CM | POA: Diagnosis not present

## 2014-11-17 DIAGNOSIS — M13869 Other specified arthritis, unspecified knee: Secondary | ICD-10-CM | POA: Insufficient documentation

## 2014-11-17 DIAGNOSIS — E1122 Type 2 diabetes mellitus with diabetic chronic kidney disease: Secondary | ICD-10-CM | POA: Diagnosis not present

## 2014-11-17 DIAGNOSIS — Z7951 Long term (current) use of inhaled steroids: Secondary | ICD-10-CM | POA: Insufficient documentation

## 2014-11-17 DIAGNOSIS — Z87891 Personal history of nicotine dependence: Secondary | ICD-10-CM | POA: Diagnosis not present

## 2014-11-17 DIAGNOSIS — Z833 Family history of diabetes mellitus: Secondary | ICD-10-CM | POA: Diagnosis not present

## 2014-11-17 DIAGNOSIS — G4730 Sleep apnea, unspecified: Secondary | ICD-10-CM | POA: Diagnosis not present

## 2014-11-17 DIAGNOSIS — Z885 Allergy status to narcotic agent status: Secondary | ICD-10-CM | POA: Diagnosis not present

## 2014-11-17 DIAGNOSIS — Z79899 Other long term (current) drug therapy: Secondary | ICD-10-CM | POA: Insufficient documentation

## 2014-11-17 DIAGNOSIS — I2510 Atherosclerotic heart disease of native coronary artery without angina pectoris: Secondary | ICD-10-CM | POA: Diagnosis not present

## 2014-11-17 DIAGNOSIS — I119 Hypertensive heart disease without heart failure: Secondary | ICD-10-CM | POA: Insufficient documentation

## 2014-11-17 DIAGNOSIS — Z87442 Personal history of urinary calculi: Secondary | ICD-10-CM | POA: Insufficient documentation

## 2014-11-17 DIAGNOSIS — Z818 Family history of other mental and behavioral disorders: Secondary | ICD-10-CM | POA: Insufficient documentation

## 2014-11-17 DIAGNOSIS — Z823 Family history of stroke: Secondary | ICD-10-CM | POA: Diagnosis not present

## 2014-11-17 DIAGNOSIS — K219 Gastro-esophageal reflux disease without esophagitis: Secondary | ICD-10-CM | POA: Insufficient documentation

## 2014-11-17 DIAGNOSIS — E785 Hyperlipidemia, unspecified: Secondary | ICD-10-CM | POA: Insufficient documentation

## 2014-11-17 DIAGNOSIS — Z8042 Family history of malignant neoplasm of prostate: Secondary | ICD-10-CM | POA: Insufficient documentation

## 2014-11-17 HISTORY — PX: CARDIAC CATHETERIZATION: SHX172

## 2014-11-17 LAB — GLUCOSE, CAPILLARY: Glucose-Capillary: 157 mg/dL — ABNORMAL HIGH (ref 65–99)

## 2014-11-17 SURGERY — RIGHT HEART CATH
Anesthesia: Moderate Sedation

## 2014-11-17 MED ORDER — SODIUM CHLORIDE 0.9 % IJ SOLN: 3.0000 mL | INTRAMUSCULAR | Status: DC | PRN

## 2014-11-17 MED ORDER — SODIUM CHLORIDE 0.9 % IJ SOLN: 3.0000 mL | Freq: Two times a day (BID) | INTRAMUSCULAR | Status: DC

## 2014-11-17 MED ORDER — FENTANYL CITRATE (PF) 100 MCG/2ML IJ SOLN
INTRAMUSCULAR | Status: DC | PRN
  Administered 2014-11-17 (×2): 25 ug via INTRAVENOUS

## 2014-11-17 MED ORDER — MIDAZOLAM HCL 2 MG/2ML IJ SOLN
INTRAMUSCULAR | Status: AC
  Filled 2014-11-17: qty 2

## 2014-11-17 MED ORDER — SODIUM CHLORIDE 0.9 % IV SOLN
INTRAVENOUS | Status: DC
Start: 2014-11-17 — End: 2014-11-17
  Administered 2014-11-17: 08:00:00 via INTRAVENOUS

## 2014-11-17 MED ORDER — NITROGLYCERIN 1 MG/10 ML FOR IR/CATH LAB
INTRA_ARTERIAL | Status: DC | PRN
  Administered 2014-11-17: 08:00:00

## 2014-11-17 MED ORDER — FENTANYL CITRATE (PF) 100 MCG/2ML IJ SOLN
INTRAMUSCULAR | Status: AC
  Filled 2014-11-17: qty 2

## 2014-11-17 MED ORDER — SODIUM CHLORIDE 0.9 % WEIGHT BASED INFUSION: 3.0000 mL/kg/h | INTRAVENOUS | Status: DC

## 2014-11-17 MED ORDER — SODIUM CHLORIDE 0.9 % IV SOLN: 250.0000 mL | INTRAVENOUS | Status: DC | PRN

## 2014-11-17 MED ORDER — MIDAZOLAM HCL 2 MG/2ML IJ SOLN
INTRAMUSCULAR | Status: DC | PRN
  Administered 2014-11-17: 1 mg via INTRAVENOUS

## 2014-11-17 SURGICAL SUPPLY — 6 items
CATH SWANZ 7F THERMO (CATHETERS) ×2 IMPLANT
GUIDEWIRE EMER 3M J .025X150CM (WIRE) ×2 IMPLANT
KIT MANI 3VAL PERCEP (MISCELLANEOUS) ×2 IMPLANT
NEEDLE PERC 18GX7CM (NEEDLE) ×2 IMPLANT
PACK CARDIAC CATH (CUSTOM PROCEDURE TRAY) ×2 IMPLANT
SHEATH PINNACLE 7F 10CM (SHEATH) ×2 IMPLANT

## 2014-11-17 NOTE — H&P (Signed)
Chief Complaint: Chief Complaint  Patient presents with  . Follow-up  saw fleming wants him to have right heart cath  . Shortness of Breath  I still have some  Date of Service: 11/12/2014 Date of Birth: [birth year] PCP: DAVID Brandy Hale, MD  History of Present Illness: Taylor Austin is a 74 y.o.male patient who returns for follow-up visit. Has a history of sleep apnea on BiPAP, history of diabetes mellitus, hyperlipidemia and coronary artery disease status post PCI with a stent in his LAD. He was evaluated with a functional study showing possible septal and anteroseptal ischemia. Underwent left cardiac catheterization revealing patent stent in his LAD with no significant disease elsewhere. EF was normal. Patient's symptoms are likely secondary to nonischemic etiology. Patient continues to have shortness of breath and peripheral edema. Echocardiogram showed evidence of mild to moderate pulmonary hypertension with estimated right ventricular systolic pressure of 40 mm Hg. Patient now is referred by pulmonology for consideration for right heart catheterization to better evaluate pulmonary hypertension to guide further intervention.  Past Medical and Surgical History  Past Medical History Past Medical History  Diagnosis Date  . Arthritis of knee  . Chronic kidney disease  kidney stones  . Coronary artery disease  status post stenting in 2004  . Depression  . Diabetes mellitus type 2, uncomplicated  . Dysphagia, unspecified(787.20)  . Esophageal reflux  . Essential hypertension, benign  . History of Barrett's esophagus 2003  History of Barrett's esophagus. Last EGD August 2011  . History of rheumatic fever 1961  . Hyperlipidemia  . Hypersomnia with sleep apnea, unspecified  . Low testosterone  . Sleep apnea  on BiPAP  . Vitamin B12 deficiency   Past Surgical History He has a past surgical history that includes Back surgery (2006); Carotid stent; Colonoscopy (06/13/2010); egd  (06/03/2003); Tonsillectomy; Coronary angioplasty; egd (08/08/2009); egd (11/28/2012); and cardiac cath (2016).   Medications and Allergies  Current Medications  Current Outpatient Prescriptions  Medication Sig Dispense Refill  . finasteride (PROSCAR) 5 mg tablet Take 5 mg by mouth once daily.  . fluticasone-salmeterol (ADVAIR HFA) 115-21 mcg/actuation inhaler Inhale 2 inhalations into the lungs every 12 (twelve) hours. 1 Inhaler 12  . glyBURIDE-metFORMIN (GLUCOVANCE) 5-500 mg tablet TAKE ONE TABLET BY MOUTH TWICE DAILY WITH MEALS 180 tablet 0  . losartan-hydrochlorothiazide (HYZAAR) 100-25 mg tablet TAKE ONE TABLET BY MOUTH ONCE DAILY 30 tablet 5  . magnesium oxide (MAG-OX) 400 mg tablet Take 1 tablet (400 mg total) by mouth 2 (two) times daily. 60 tablet 5  . metoprolol succinate (TOPROL-XL) 25 MG XL tablet TAKE ONE TABLET BY MOUTH ONCE DAILY 90 tablet 1  . multivitamin tablet Take 1 tablet by mouth once daily.  Marland Kitchen omeprazole (PRILOSEC) 10 MG DR capsule Take 20 mg by mouth once daily.  . pioglitazone (ACTOS) 45 MG tablet TAKE ONE-HALF TABLET BY MOUTH TWICE DAILY 90 tablet 1  . simvastatin (ZOCOR) 40 MG tablet TAKE ONE TABLET BY MOUTH ONCE DAILY FOR CHOLESTEROL 30 tablet 0  . blood glucose diagnostic (CONTOUR TEST STRIPS) test strip Use 1 strip via meter once a day [250.00]  . blood glucose diagnostic (CONTOUR TEST STRIPS) test strip Use once daily. 200 each 3  . oxymetazoline (AFRIN, OXYMETAZOLINE,) 0.05 % nasal spray Place into both nostrils 2 (two) times daily.   No current facility-administered medications for this visit.   Allergies: Hydrocodone hcl and Percocet [oxycodone-acetaminophen]  Social and Family History  Social History reports that he quit smoking about 41 years  ago. He has a 30.00 pack-year smoking history. He has never used smokeless tobacco. He reports that he drinks alcohol. He reports that he does not use illicit drugs.  Family History Family History  Problem  Relation Age of Onset  . Hypertension Mother  . Diabetes type II Mother  . Diabetes mellitus Mother  . Mental illness Mother  . Stroke Mother  . Stroke Father  . Prostate cancer Brother   Review of Systems  Review of Systems  Constitutional: Negative for chills, diaphoresis, fever, malaise/fatigue and weight loss.  HENT: Negative for congestion, ear discharge, hearing loss and tinnitus.  Eyes: Negative for blurred vision.  Respiratory: Positive for shortness of breath. Negative for cough, hemoptysis, sputum production and wheezing.  Cardiovascular: Positive for leg swelling. Negative for palpitations, orthopnea, claudication and PND.  Gastrointestinal: Negative for abdominal pain, blood in stool, constipation, diarrhea, heartburn, melena, nausea and vomiting.  Genitourinary: Negative for dysuria, frequency, hematuria and urgency.  Musculoskeletal: Positive for back pain. Negative for falls, joint pain and myalgias.  Skin: Negative for itching and rash.  Neurological: Negative for dizziness, tingling, focal weakness, loss of consciousness, weakness and headaches.  Endo/Heme/Allergies: Negative for polydipsia. Does not bruise/bleed easily.  Psychiatric/Behavioral: Negative for depression, memory loss and substance abuse. The patient is not nervous/anxious.    Physical Examination   Vitals: Visit Vitals  . BP 140/84 (BP Location: Left upper arm, Patient Position: Sitting, BP Cuff Size: Adult)  . Pulse 62  . Resp 12  . Ht 166.9 cm (5' 5.7")  . Wt (!) 130 kg (286 lb 9.6 oz)  . BMI 46.68 kg/m2   Ht:166.9 cm (5' 5.7") Wt:(!) 130 kg (286 lb 9.6 oz) URK:YHCW surface area is 2.45 meters squared. Body mass index is 46.68 kg/(m^2).  Wt Readings from Last 3 Encounters:  11/12/14 (!) 130 kg (286 lb 9.6 oz)  11/03/14 (!) 132 kg (291 lb)  10/11/14 (!) 130.6 kg (288 lb)   BP Readings from Last 3 Encounters:  11/12/14 140/84  11/03/14 152/82  10/11/14 142/70   General  appearance appears in no acute distress  Head Mouth and Eye exam Normocephalic, without obvious abnormality, atraumatic Dentition is good Eyes appear anicteric   Neck exam Thyroid: normal  Nodes: no obvious adenopathy  LUNGS Breath Sounds: Normal Percussion: Normal  CARDIOVASCULAR JVP CV wave: no HJR: no Elevation at 90 degrees: None Carotid Pulse: normal pulsation bilaterally Bruit: None Apex: apical impulse normal  Auscultation Rhythm: normal sinus rhythm S1: normal S2: normal Clicks: no Rub: no Murmurs: 1/6 medium pitched mid systolic blowing at lower left sternal border  Gallop: None ABDOMEN Liver enlargement: no Pulsatile aorta: no Ascites: no Bruits: no  EXTREMITIES Clubbing: no Edema: 3+ bilateral pedal edema Pulses: peripheral pulses symmetrical Femoral Bruits: no Amputation: no SKIN Rash: no Cyanosis: no Embolic phemonenon: no Bruising: no NEURO Alert and Oriented to person, place and time: yes Non focal: yes  PSYCH: Pt appears to have normal affect  LABS REVIEWED Last 3 CBC results: Lab Results  Component Value Date  WBC 4.2 09/07/2014  WBC 5.2 08/03/2014  WBC 5.6 04/20/2014   Lab Results  Component Value Date  HGB 14.0 (L) 09/07/2014  HGB 13.1 (L) 08/03/2014  HGB 13.0 (L) 04/20/2014   Lab Results  Component Value Date  HCT 41.2 09/07/2014  HCT 38.8 (L) 08/03/2014  HCT 37.4 (L) 04/20/2014   Lab Results  Component Value Date  PLT 151 09/07/2014  PLT 163 08/03/2014  PLT 153 04/20/2014  Lab Results  Component Value Date  CREATININE 1.0 09/07/2014  BUN 17 09/07/2014  NA 137 09/07/2014  K 5.0 09/07/2014  CL 98 09/07/2014  CO2 28.4 09/07/2014   Lab Results  Component Value Date  HGBA1C 6.5 (H) 09/07/2014   Lab Results  Component Value Date  HDL 41.8 09/07/2014  HDL 43.7 05/15/2013   Lab Results  Component Value Date  LDLCALC 85 09/07/2014  LDLCALC 66 05/15/2013   Lab Results  Component Value Date  TRIG  199 09/07/2014  TRIG 140 05/15/2013   Lab Results  Component Value Date  ALT 17 09/07/2014  AST 20 09/07/2014  ALKPHOS 43 09/07/2014   Assessment and Plan   74 y.o. male with  ICD-10-CM ICD-9-CM  1. Coronary artery disease involving native coronary artery of native heart without angina pectoris-t cardiac catheterization reveals no evidence of progression of disease. Symptoms did not appear to be secondary to ischemic etiology. I25.10 414.01  2. Essential hypertension, benign blood pressure is being treated with losartan-hydrochlorothiazide and metoprolol. Will continue with this regimen recommend a dash diet as well as weight loss I10 401.1  3. Hyperlipidemia, unspecified hyperlipidemia type-Will continue with simvastatin at 40 mg daily with an LDL goal of less than 100 E78.5 272.4  4. Diabetes mellitus type 2, uncomplicated D42.8 768.11  5. Shortness of breath - etiologies unclear. Does not appear to be ischemic. Discuss consideration for weight loss or the lap band or bariatric surgery. R06.02 786.05  6. Sleep apnea, unspecified type-compliance with BiPAP a weight loss is recommended. Will proceed with right heart catheterization to evaluate pulmonary pressures based on pulmonology recommendations to guide further medical therapy of pulmonary hypertension. G47.30 780.57  7. Acute edema the-continue with Lasix R60.9 782.3   Return in about 4 weeks (around 12/10/2014).  These notes generated with voice recognition software. I apologize for typographical errors.  Sydnee Levans, MD

## 2014-11-17 NOTE — Procedures (Signed)
Right heart cath.  Indication: pulmonary hypertension Sedation: versed/fentanyl After informed consent, time out protocol and adequate sedaiton, right inguinal region was anesthetized with lidocaine. Right femoral artery abg drawn for Fick. 7 french sheath inserted into right femoral vein with guide wire. Sheath flushed and sg catheter inserted in right atrium, right ventrical and pulmonary artery. Fick abg drawn. Hemodynamic measurements obtained. SG and sheath removed. No immediate complications.

## 2014-11-17 NOTE — Discharge Instructions (Signed)
Groin Insertion Instructions-If you lose feeling or develop tingling or pain in your leg or foot after the procedure, please walk around first.  If the discomfort does not improve , contact your physician and proceed to the nearest emergency room.  Loss of feeling in your leg might mean that a blockage has formed in the artery and this can be appropriately treated.  Limit your activity for the next two days after your procedure.  Avoid stooping, bending, heavy lifting or exertion as this may put pressure on the insertion site.  Resume normal activities in 48 hours.  You may shower after 24 hours but avoid excessive warm water and do not scrub the site.  Remove clear dressing in 48 hours.  If you have had a closure device inserted, do not soak in a tub bath or a hot tub for at least one week. ° °No driving for 48 hours after discharge.  After the procedure, check the insertion site occasionally.  If any oozing occurs or there is apparent swelling, firm pressure over the site will prevent a bruise from forming.  You can not hurt anything by pressing directly on the site.  The pressure stops the bleeding by allowing a small clot to form.  If the bleeding continues after the pressure has been applied for more than 15 minutes, call 911 or go to the nearest emergency room.   ° °The x-ray dye causes you to pass a considerate amount of urine.  For this reason, you will be asked to drink plenty of liquids after the procedure to prevent dehydration.  You may resume you regular diet.  Avoid caffeine products.   ° °For pain at the site of your procedure, take non-aspirin medicines such as Tylenol. ° °Medications: A. Hold Metformin for 48 hours if applicable.  B. Continue taking all your present medications at home unless your doctor prescribes any changes.Groin Insertion Instructions-If you lose feeling or develop tingling or pain in your leg or foot after the procedure, please walk around first.  If the discomfort does not  improve , contact your physician and proceed to the nearest emergency room.  Loss of feeling in your leg might mean that a blockage has formed in the artery and this can be appropriately treated.  Limit your activity for the next two days after your procedure.  Avoid stooping, bending, heavy lifting or exertion as this may put pressure on the insertion site.  Resume normal activities in 48 hours.  You may shower after 24 hours but avoid excessive warm water and do not scrub the site.  Remove clear dressing in 48 hours.  If you have had a closure device inserted, do not soak in a tub bath or a hot tub for at least one week. ° °No driving for 48 hours after discharge.  After the procedure, check the insertion site occasionally.  If any oozing occurs or there is apparent swelling, firm pressure over the site will prevent a bruise from forming.  You can not hurt anything by pressing directly on the site.  The pressure stops the bleeding by allowing a small clot to form.  If the bleeding continues after the pressure has been applied for more than 15 minutes, call 911 or go to the nearest emergency room.   ° °The x-ray dye causes you to pass a considerate amount of urine.  For this reason, you will be asked to drink plenty of liquids after the procedure to prevent dehydration.  You   may resume you regular diet.  Avoid caffeine products.   ° °For pain at the site of your procedure, take non-aspirin medicines such as Tylenol. ° °Medications: A. Hold Metformin for 48 hours if applicable.  B. Continue taking all your present medications at home unless your doctor prescribes any changes. °

## 2014-11-17 NOTE — OR Nursing (Signed)
etCO2 monitoring d/c upon arrive to [facility]...etco235 upon arrival. Pt awake alert

## 2014-11-17 NOTE — OR Nursing (Signed)
CBG this AM : 157  Did not take AM oral agent. Last dose 11-8@ 7PM

## 2014-11-30 ENCOUNTER — Other Ambulatory Visit: Payer: Self-pay | Admitting: Specialist

## 2014-12-26 ENCOUNTER — Other Ambulatory Visit: Payer: Self-pay | Admitting: Urology

## 2014-12-26 DIAGNOSIS — N400 Enlarged prostate without lower urinary tract symptoms: Secondary | ICD-10-CM

## 2015-01-24 ENCOUNTER — Other Ambulatory Visit: Payer: Self-pay | Admitting: Specialist

## 2015-01-25 ENCOUNTER — Ambulatory Visit
Admission: RE | Admit: 2015-01-25 | Discharge: 2015-01-25 | Disposition: A | Discharge status: Home / Self Care | Payer: Medicare Other | Source: Ambulatory Visit | Attending: Specialist | Admitting: Specialist

## 2015-01-25 DIAGNOSIS — K760 Fatty (change of) liver, not elsewhere classified: Secondary | ICD-10-CM | POA: Insufficient documentation

## 2015-01-27 ENCOUNTER — Other Ambulatory Visit: Payer: Self-pay | Admitting: Specialist

## 2015-02-18 ENCOUNTER — Encounter: Payer: Self-pay | Admitting: Physical Therapy

## 2015-02-18 ENCOUNTER — Ambulatory Visit: Payer: Medicare Other | Attending: Neurology | Admitting: Physical Therapy

## 2015-02-18 DIAGNOSIS — R29818 Other symptoms and signs involving the nervous system: Secondary | ICD-10-CM | POA: Insufficient documentation

## 2015-02-18 DIAGNOSIS — R2689 Other abnormalities of gait and mobility: Secondary | ICD-10-CM | POA: Insufficient documentation

## 2015-02-18 DIAGNOSIS — R42 Dizziness and giddiness: Secondary | ICD-10-CM | POA: Diagnosis present
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
## 2015-02-22 ENCOUNTER — Encounter: Payer: Self-pay | Admitting: Physical Therapy

## 2015-02-22 ENCOUNTER — Ambulatory Visit: Payer: Medicare Other

## 2015-02-22 VITALS — BP 118/54 | HR 39

## 2015-02-22 DIAGNOSIS — R2689 Other abnormalities of gait and mobility: Secondary | ICD-10-CM

## 2015-02-22 DIAGNOSIS — R42 Dizziness and giddiness: Secondary | ICD-10-CM

## 2015-02-22 DIAGNOSIS — R29818 Other symptoms and signs involving the nervous system: Secondary | ICD-10-CM | POA: Diagnosis not present
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
## 2015-02-22 NOTE — Patient Instructions (Signed)
Marching In-Place    Standing straight, alternate bringing knees toward trunk. Perform for 30 seconds rest and repeat 2 more times. Do 2 times per day. The slower you move the more difficult the exercise. Progress until you can balance on each leg for >10 seconds without losing balance  Tandem Stance    Right foot in front of left, heel touching toe both feet "straight ahead". Start with feet in wider position to make easier. Balance in this position _30__ seconds. Do with left foot in front of right. Perform 3 times with each foot forward. Perform 2 times/day.

## 2015-03-01 ENCOUNTER — Ambulatory Visit (INDEPENDENT_AMBULATORY_CARE_PROVIDER_SITE_OTHER): Payer: Medicare Other | Admitting: Urology

## 2015-03-01 ENCOUNTER — Ambulatory Visit: Payer: Medicare Other | Admitting: Urology

## 2015-03-01 ENCOUNTER — Encounter: Payer: Self-pay | Admitting: Urology

## 2015-03-01 VITALS — BP 130/81 | HR 43 | Resp 16 | Ht 66.5 in | Wt 265.3 lb

## 2015-03-01 DIAGNOSIS — N400 Enlarged prostate without lower urinary tract symptoms: Secondary | ICD-10-CM | POA: Diagnosis not present

## 2015-03-01 DIAGNOSIS — N3281 Overactive bladder: Secondary | ICD-10-CM | POA: Diagnosis not present

## 2015-03-01 DIAGNOSIS — Z87898 Personal history of other specified conditions: Secondary | ICD-10-CM

## 2015-03-01 LAB — BLADDER SCAN AMB NON-IMAGING: Scan Result: 34

## 2015-03-01 MED ORDER — MIRABEGRON ER 50 MG PO TB24: 50.0000 mg | ORAL_TABLET | Freq: Every day | ORAL | Status: DC

## 2015-03-01 NOTE — Progress Notes (Signed)
03/01/2015 5:54 PM   Taylor Austin Chauncey Cruel 09/23/1940 [id number]  Referring provider: Juluis Pitch, MD [address] [place], [place] [address]  Chief Complaint  Patient presents with  . Over Active Bladder  . Benign Prostatic Hypertrophy  . Follow-up    HPI: 75 yo M previously followed by Dr. Elnoria Howard for BPH, OAB.    BPH He had obstructive voiding symptoms for a long period of time including weak stream, intermittency, and postvoid dribbling.  He tried Flomax which did not help and he did not like retrograde ejaculation so he stopped this medication.  He is currently on finasteride has been for numerous years.     OAB He has tried Retail buyer (can't affortd and not effective), Toviaz, and possibly Sancturia without any benefit.   PVR today minimal.  He does have OSA, wears Bipap at night.  He continues to drink decaf ice tea and water throughout the day.  His nocturia is improving to 3 times nightly.  He's been very active lately and lost a good amount of weight avoiding carbohydrates. His diabetes is under good control.  History of elevated/ rising PSA  History of rising PSA.  Most recent PSAs have stabilized 1.08.   Most recent rectal exam 07/2014, small firm without nodules.    Today, he complains of episodes of dizziness.  No gross hematuria.  No UTIs.        IPSS      03/01/15 1400       International Prostate Symptom Score   How often have you had the sensation of not emptying your bladder? Less than half the time     How often have you had to urinate less than every two hours? More than half the time     How often have you found you stopped and started again several times when you urinated? More than half the time     How often have you found it difficult to postpone urination? More than half the time     How often have you had a weak urinary stream? More than half the time     How often have you had to strain to start urination? Less than 1 in 5 times     How many times  did you typically get up at night to urinate? 3 Times     Total IPSS Score 22     Quality of Life due to urinary symptoms   If you were to spend the rest of your life with your urinary condition just the way it is now how would you feel about that? Mostly Disatisfied          PMH: Past Medical History  Diagnosis Date  . OAB (overactive bladder)   . Back pain   . Allergy   . Diabetes mellitus without complication ([place])   . Diverticulitis   . Sleep apnea   . Hyperlipidemia   . Coronary artery disease   . Barrett esophagus   . Rheumatic fever   . Arthritis   . Vitamin B12 deficiency   . Low testosterone   . GERD (gastroesophageal reflux disease)   . Hypertension   . Hypersomnia   . Chronic kidney disease   . Kidney stones   . Depression   . BPH (benign prostatic hyperplasia)     Surgical History: Past Surgical History  Procedure Laterality Date  . Lumbar spine surgery    . Cervical spine surgery    . Thoracic spine  surgery    . Colonoscopy    . Esophagogastroduodenoscopy endoscopy    . Carotid stent    . Back surgery    . Tonsillectomy    . Coronary angioplasty    . Cardiac catheterization  08/11/2014    Procedure: Left Heart Cath and Coronary Angiography;  Surgeon: Teodoro Spray, MD;  Location: [place] [facility];  Service: Cardiovascular;;  . Cardiac catheterization N/A 11/17/2014    Procedure: Right Heart Cath;  Surgeon: Teodoro Spray, MD;  Location: [place] [facility];  Service: Cardiovascular;  Laterality: N/A;    Home Medications:    Medication List       This list is accurate as of: 03/01/15 11:59 PM.  Always use your most recent med list.               BAYER CONTOUR TEST test strip  Generic drug:  glucose blood     finasteride 5 MG tablet  Commonly known as:  PROSCAR  TAKE ONE TABLET BY MOUTH ONCE DAILY     fluticasone-salmeterol 115-21 MCG/ACT inhaler  Commonly known as:  ADVAIR HFA  Inhale 2 puffs into the lungs 2 (two) times daily.  Reported on 03/01/2015     glyBURIDE-metformin 5-500 MG tablet  Commonly known as:  GLUCOVANCE  TAKE ONE TABLET BY MOUTH TWICE DAILY WITH MEALS     losartan-hydrochlorothiazide 100-25 MG tablet  Commonly known as:  HYZAAR  Take 1 tablet by mouth daily.     magnesium oxide 400 MG tablet  Commonly known as:  MAG-OX  Take 400 mg by mouth daily.     metoprolol succinate 25 MG 24 hr tablet  Commonly known as:  TOPROL-XL  TAKE ONE TABLET BY MOUTH ONCE DAILY     mirabegron ER 50 MG Tb24 tablet  Commonly known as:  MYRBETRIQ  Take 1 tablet (50 mg total) by mouth daily.     MULTI-VITAMINS Tabs  Take 1 tablet by mouth daily.     oxymetazoline 0.05 % nasal spray  Commonly known as:  AFRIN  Place 1 spray into both nostrils 2 (two) times daily.     pioglitazone 45 MG tablet  Commonly known as:  ACTOS  Take 22.5 mg by mouth 2 (two) times daily.     simvastatin 40 MG tablet  Commonly known as:  ZOCOR  Take 40 mg by mouth daily at 6 PM.        Allergies:  Allergies  Allergen Reactions  . Hydrocodone Shortness Of Breath  . Oxycodone-Acetaminophen Shortness Of Breath    Family History: Family History  Problem Relation Age of Onset  . Stroke Father   . Anesthesia problems Father   . Anesthesia problems Mother   . Prostate cancer Brother   . Kidney disease Neg Hx     Social History:  reports that he quit smoking about 41 years ago. His smoking use included Cigarettes. He does not have any smokeless tobacco history on file. He reports that he drinks about 0.6 oz of alcohol per week. He reports that he does not use illicit drugs.  ROS: UROLOGY Frequent Urination?: Yes Hard to postpone urination?: Yes Burning/pain with urination?: No Get up at night to urinate?: Yes Leakage of urine?: Yes Urine stream starts and stops?: Yes Trouble starting stream?: No Do you have to strain to urinate?: No Blood in urine?: No Urinary tract infection?: No Sexually transmitted disease?:  No Injury to kidneys or bladder?: No Painful intercourse?: No Weak  stream?: Yes Erection problems?: Yes Penile pain?: No  Gastrointestinal Nausea?: No Vomiting?: No Indigestion/heartburn?: No Diarrhea?: Yes Constipation?: Yes  Constitutional Fever: No Night sweats?: No Weight loss?: No Fatigue?: Yes  Skin Skin rash/lesions?: No Itching?: No  Eyes Blurred vision?: Yes Double vision?: No  Ears/Nose/Throat Sore throat?: No Sinus problems?: Yes  Hematologic/Lymphatic Swollen glands?: No Easy bruising?: No  Cardiovascular Leg swelling?: Yes Chest pain?: No  Respiratory Cough?: No Shortness of breath?: Yes  Endocrine Excessive thirst?: No  Musculoskeletal Back pain?: Yes Joint pain?: Yes  Neurological Headaches?: No Dizziness?: Yes  Psychologic Depression?: No Anxiety?: No  Physical Exam: BP 130/81 mmHg  Pulse 43  Resp 16  Ht 5' 6.5" (1.689 m)  Wt 265 lb 4.8 oz (120.339 kg)  BMI 42.18 kg/m2  Constitutional:  Alert and oriented, No acute distress. HEENT: [place] AT, moist mucus membranes.  Trachea midline, no masses. Cardiovascular: No clubbing, cyanosis, or edema. Respiratory: Normal respiratory effort, no increased work of breathing. GI: Abdomen is soft, nontender, nondistended, no abdominal masses. Obese.   Skin: No rashes, bruises or suspicious lesions. Neurologic: Grossly intact, no focal deficits, moving all 4 extremities. Psychiatric: Normal mood and affect.  Laboratory Data: Lab Results  Component Value Date   WBC 8.9 01/04/2014   HGB 14.4 01/04/2014   HCT 43.9 01/04/2014   MCV 97 01/04/2014   PLT 161 01/04/2014    Lab Results  Component Value Date   CREATININE 0.97 01/04/2014   Most recent PSA 08/2014 1.06     Urinalysis Results for orders placed or performed in visit on 03/01/15  Microscopic Examination  Result Value Ref Range   WBC, UA 0-5 0 -  5 /hpf   RBC, UA 0-2 0 -  2 /hpf   Epithelial Cells (non renal) None seen 0 -  10 /hpf   Casts Present (A) None seen /lpf   Cast Type Hyaline casts N/A   Mucus, UA Present (A) Not Estab.   Bacteria, UA None seen None seen/Few  Urinalysis, Complete  Result Value Ref Range   Specific Gravity, UA 1.015 1.005 - 1.030   pH, UA 7.5 5.0 - 7.5   Color, UA Yellow Yellow   Appearance Ur Clear Clear   Leukocytes, UA Negative Negative   Protein, UA Trace (A) Negative/Trace   Glucose, UA Negative Negative   Ketones, UA Negative Negative   RBC, UA Negative Negative   Bilirubin, UA Negative Negative   Urobilinogen, Ur 0.2 0.2 - 1.0 mg/dL   Nitrite, UA Negative Negative   Microscopic Examination See below:   PSA  Result Value Ref Range   Prostate Specific Ag, Serum 1.4 0.0 - 4.0 ng/mL  BLADDER SCAN AMB NON-IMAGING  Result Value Ref Range   Scan Result 34 mL      Assessment & Plan:    1. OAB (overactive bladder)  post void residual today minimal. No evidence of infection on UA. Continued to stress the importance of behavior modification. Mybetriq samples given today, 50 mg x 2 weeks.  He will call and let us know if he finds these effective and would like to fill this prescription. - Urinalysis, Complete - BLADDER SCAN AMB NON-IMAGING  2. BPH (benign prostatic hyperplasia) [place] finasteride for objective voiding symptoms. - PSA  3. History of elevated PSA Rectal exam deferred today, repeat PSA today 1.4 which is relatively stable. Recommend continued annual PSA while on finasteride.   Return in about 6 months (around 08/29/2015) for recheck OAB/ BPH.  Hollice Espy, MD  [facility], [facility] [address] [place], [place] [address] [phone]

## 2015-03-02 ENCOUNTER — Ambulatory Visit: Payer: Medicare Other | Admitting: Physical Therapy

## 2015-03-02 ENCOUNTER — Encounter: Payer: Self-pay | Admitting: Physical Therapy

## 2015-03-02 DIAGNOSIS — R29818 Other symptoms and signs involving the nervous system: Secondary | ICD-10-CM | POA: Diagnosis not present

## 2015-03-02 DIAGNOSIS — R2689 Other abnormalities of gait and mobility: Secondary | ICD-10-CM

## 2015-03-02 DIAGNOSIS — R42 Dizziness and giddiness: Secondary | ICD-10-CM

## 2015-03-02 LAB — URINALYSIS, COMPLETE
Bilirubin, UA: NEGATIVE
GLUCOSE, UA: NEGATIVE
KETONES UA: NEGATIVE
Leukocytes, UA: NEGATIVE
NITRITE UA: NEGATIVE
RBC, UA: NEGATIVE
Specific Gravity, UA: 1.015 (ref 1.005–1.030)
UUROB: 0.2 mg/dL (ref 0.2–1.0)
pH, UA: 7.5 (ref 5.0–7.5)

## 2015-03-02 LAB — MICROSCOPIC EXAMINATION
Bacteria, UA: NONE SEEN
Epithelial Cells (non renal): NONE SEEN /hpf (ref 0–10)

## 2015-03-02 LAB — PSA: PROSTATE SPECIFIC AG, SERUM: 1.4 ng/mL (ref 0.0–4.0)
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
## 2015-03-03 ENCOUNTER — Other Ambulatory Visit: Payer: Self-pay | Admitting: Nurse Practitioner

## 2015-03-03 ENCOUNTER — Ambulatory Visit
Admission: RE | Admit: 2015-03-03 | Discharge: 2015-03-03 | Disposition: A | Discharge status: Home / Self Care | Payer: Medicare Other | Source: Ambulatory Visit | Attending: Nurse Practitioner | Admitting: Nurse Practitioner

## 2015-03-03 ENCOUNTER — Encounter: Payer: Self-pay | Admitting: Urology

## 2015-03-03 DIAGNOSIS — I700 Atherosclerosis of aorta: Secondary | ICD-10-CM | POA: Insufficient documentation

## 2015-03-03 DIAGNOSIS — R1032 Left lower quadrant pain: Secondary | ICD-10-CM

## 2015-03-03 DIAGNOSIS — K5732 Diverticulitis of large intestine without perforation or abscess without bleeding: Secondary | ICD-10-CM | POA: Diagnosis not present

## 2015-03-03 DIAGNOSIS — N200 Calculus of kidney: Secondary | ICD-10-CM | POA: Insufficient documentation

## 2015-03-03 DIAGNOSIS — K5730 Diverticulosis of large intestine without perforation or abscess without bleeding: Secondary | ICD-10-CM | POA: Diagnosis not present

## 2015-03-03 MED ORDER — IOHEXOL 300 MG/ML  SOLN
100.0000 mL | Freq: Once | INTRAMUSCULAR | Status: AC | PRN
  Administered 2015-03-03: 100 mL via INTRAVENOUS

## 2015-03-04 ENCOUNTER — Ambulatory Visit: Payer: Medicare Other

## 2015-03-07 ENCOUNTER — Ambulatory Visit: Payer: Medicare Other | Admitting: Physical Therapy

## 2015-03-08 ENCOUNTER — Ambulatory Visit: Payer: Medicare Other

## 2015-03-09 ENCOUNTER — Ambulatory Visit: Payer: Medicare Other | Admitting: Physical Therapy

## 2015-03-11 ENCOUNTER — Telehealth: Payer: Self-pay

## 2015-03-11 NOTE — Telephone Encounter (Signed)
Myrbetriq PA has been DENIED.

## 2015-03-14 ENCOUNTER — Ambulatory Visit: Payer: Medicare Other | Admitting: Physical Therapy

## 2015-03-16 ENCOUNTER — Ambulatory Visit: Payer: Medicare Other | Admitting: Physical Therapy

## 2015-03-21 ENCOUNTER — Ambulatory Visit: Payer: Medicare Other | Admitting: Physical Therapy

## 2015-03-23 ENCOUNTER — Ambulatory Visit: Payer: Medicare Other | Admitting: Physical Therapy

## 2015-03-28 ENCOUNTER — Ambulatory Visit: Payer: Medicare Other | Admitting: Physical Therapy

## 2015-03-30 ENCOUNTER — Ambulatory Visit: Payer: Medicare Other | Admitting: Physical Therapy

## 2015-04-04 ENCOUNTER — Ambulatory Visit: Payer: Medicare Other | Admitting: Physical Therapy

## 2015-04-06 ENCOUNTER — Ambulatory Visit: Payer: Medicare Other | Admitting: Physical Therapy

## 2015-06-09 ENCOUNTER — Encounter: Payer: Self-pay | Admitting: *Deleted

## 2015-06-10 ENCOUNTER — Ambulatory Visit
Admission: RE | Admit: 2015-06-10 | Discharge: 2015-06-10 | Disposition: A | Discharge status: Home / Self Care | Payer: Medicare Other | Source: Ambulatory Visit | Attending: Unknown Physician Specialty | Admitting: Unknown Physician Specialty

## 2015-06-10 ENCOUNTER — Encounter: Payer: Self-pay | Admitting: *Deleted

## 2015-06-10 ENCOUNTER — Ambulatory Visit: Payer: Medicare Other | Admitting: Certified Registered"

## 2015-06-10 ENCOUNTER — Encounter
Admission: RE | Disposition: A | Discharge status: Home / Self Care | Payer: Self-pay | Source: Ambulatory Visit | Attending: Unknown Physician Specialty

## 2015-06-10 DIAGNOSIS — Z7951 Long term (current) use of inhaled steroids: Secondary | ICD-10-CM | POA: Insufficient documentation

## 2015-06-10 DIAGNOSIS — Z885 Allergy status to narcotic agent status: Secondary | ICD-10-CM | POA: Insufficient documentation

## 2015-06-10 DIAGNOSIS — Z9109 Other allergy status, other than to drugs and biological substances: Secondary | ICD-10-CM | POA: Diagnosis not present

## 2015-06-10 DIAGNOSIS — F329 Major depressive disorder, single episode, unspecified: Secondary | ICD-10-CM | POA: Insufficient documentation

## 2015-06-10 DIAGNOSIS — Z955 Presence of coronary angioplasty implant and graft: Secondary | ICD-10-CM | POA: Diagnosis not present

## 2015-06-10 DIAGNOSIS — Z87442 Personal history of urinary calculi: Secondary | ICD-10-CM | POA: Insufficient documentation

## 2015-06-10 DIAGNOSIS — K5730 Diverticulosis of large intestine without perforation or abscess without bleeding: Secondary | ICD-10-CM | POA: Diagnosis not present

## 2015-06-10 DIAGNOSIS — Z9889 Other specified postprocedural states: Secondary | ICD-10-CM | POA: Diagnosis not present

## 2015-06-10 DIAGNOSIS — Z8042 Family history of malignant neoplasm of prostate: Secondary | ICD-10-CM | POA: Insufficient documentation

## 2015-06-10 DIAGNOSIS — K635 Polyp of colon: Secondary | ICD-10-CM | POA: Diagnosis not present

## 2015-06-10 DIAGNOSIS — M1990 Unspecified osteoarthritis, unspecified site: Secondary | ICD-10-CM | POA: Insufficient documentation

## 2015-06-10 DIAGNOSIS — Z87891 Personal history of nicotine dependence: Secondary | ICD-10-CM | POA: Diagnosis not present

## 2015-06-10 DIAGNOSIS — I10 Essential (primary) hypertension: Secondary | ICD-10-CM | POA: Insufficient documentation

## 2015-06-10 DIAGNOSIS — N400 Enlarged prostate without lower urinary tract symptoms: Secondary | ICD-10-CM | POA: Diagnosis not present

## 2015-06-10 DIAGNOSIS — Z79891 Long term (current) use of opiate analgesic: Secondary | ICD-10-CM | POA: Insufficient documentation

## 2015-06-10 DIAGNOSIS — K640 First degree hemorrhoids: Secondary | ICD-10-CM | POA: Diagnosis not present

## 2015-06-10 DIAGNOSIS — Z79899 Other long term (current) drug therapy: Secondary | ICD-10-CM | POA: Diagnosis not present

## 2015-06-10 DIAGNOSIS — N3281 Overactive bladder: Secondary | ICD-10-CM | POA: Diagnosis not present

## 2015-06-10 DIAGNOSIS — Z823 Family history of stroke: Secondary | ICD-10-CM | POA: Insufficient documentation

## 2015-06-10 DIAGNOSIS — R1032 Left lower quadrant pain: Secondary | ICD-10-CM | POA: Diagnosis present

## 2015-06-10 DIAGNOSIS — G4730 Sleep apnea, unspecified: Secondary | ICD-10-CM | POA: Insufficient documentation

## 2015-06-10 DIAGNOSIS — R008 Other abnormalities of heart beat: Secondary | ICD-10-CM | POA: Diagnosis not present

## 2015-06-10 DIAGNOSIS — I2510 Atherosclerotic heart disease of native coronary artery without angina pectoris: Secondary | ICD-10-CM | POA: Diagnosis not present

## 2015-06-10 DIAGNOSIS — E119 Type 2 diabetes mellitus without complications: Secondary | ICD-10-CM | POA: Insufficient documentation

## 2015-06-10 HISTORY — DX: Dysphagia, unspecified: R13.10

## 2015-06-10 HISTORY — PX: COLONOSCOPY WITH PROPOFOL: SHX5780

## 2015-06-10 HISTORY — DX: Gastro-esophageal reflux disease without esophagitis: K21.9

## 2015-06-10 LAB — GLUCOSE, CAPILLARY: GLUCOSE-CAPILLARY: 132 mg/dL — AB (ref 65–99)

## 2015-06-10 SURGERY — COLONOSCOPY WITH PROPOFOL
Anesthesia: General

## 2015-06-10 MED ORDER — GLYCOPYRROLATE 0.2 MG/ML IJ SOLN
INTRAMUSCULAR | Status: DC | PRN
  Administered 2015-06-10: 0.2 mg via INTRAVENOUS

## 2015-06-10 MED ORDER — PROPOFOL 500 MG/50ML IV EMUL
INTRAVENOUS | Status: DC | PRN
  Administered 2015-06-10: 100 ug/kg/min via INTRAVENOUS

## 2015-06-10 MED ORDER — LIDOCAINE 2% (20 MG/ML) 5 ML SYRINGE
INTRAMUSCULAR | Status: DC | PRN
  Administered 2015-06-10: 50 mg via INTRAVENOUS

## 2015-06-10 MED ORDER — SODIUM CHLORIDE 0.9 % IV SOLN
INTRAVENOUS | Status: DC
  Administered 2015-06-10: 08:00:00 via INTRAVENOUS

## 2015-06-10 MED ORDER — PROPOFOL 10 MG/ML IV BOLUS
INTRAVENOUS | Status: DC | PRN
  Administered 2015-06-10: 20 mg via INTRAVENOUS
  Administered 2015-06-10: 50 mg via INTRAVENOUS

## 2015-06-10 MED ORDER — MIDAZOLAM HCL 5 MG/5ML IJ SOLN
INTRAMUSCULAR | Status: DC | PRN
  Administered 2015-06-10: 1 mg via INTRAVENOUS

## 2015-06-10 MED ORDER — SODIUM CHLORIDE 0.9 % IV SOLN: INTRAVENOUS | Status: DC

## 2015-06-10 MED ORDER — EPHEDRINE SULFATE 50 MG/ML IJ SOLN
INTRAMUSCULAR | Status: DC | PRN
  Administered 2015-06-10: 10 mg via INTRAVENOUS

## 2015-06-10 NOTE — Op Note (Signed)
[facility] Gastroenterology Patient Name: Taylor Austin Procedure Date: 06/10/2015 8:25 AM MRN: [id number] Account #: [id number] Date of Birth: 1940/04/12 Admit Type: Outpatient Age: 75 Room: [facility] ENDO ROOM 4 Gender: Male Note Status: Finalized Procedure:            Colonoscopy Indications:          Abdominal pain in the left lower quadrant Providers:            Manya Silvas, MD Referring MD:         Taylor Roys. Lovie Macadamia, MD (Referring MD) Medicines:            Propofol per Anesthesia Procedure:            Pre-Anesthesia Assessment:                       - After reviewing the risks and benefits, the patient                        was deemed in satisfactory condition to undergo the                        procedure.                       After obtaining informed consent, the colonoscope was                        passed under direct vision. Throughout the procedure,                        the patient's blood pressure, pulse, and oxygen                        saturations were monitored continuously. The                        Colonoscope was introduced through the anus and                        advanced to the the cecum, identified by appendiceal                        orifice and ileocecal valve. The colonoscopy was                        performed without difficulty. The patient tolerated the                        procedure well. The quality of the bowel preparation                        was adequate to identify polyps. Findings:      Multiple small-mouthed diverticula were found in the sigmoid colon and       descending colon.      Internal hemorrhoids were found during endoscopy. The hemorrhoids were       small and Grade I (internal hemorrhoids that do not prolapse).      A few diverticuli had small amount of exudate in them and likely       represent divericulitis.      Multiple small-medium mouthed diverticula  were found in the transverse       colon  and ascending colon.      A diminutive polyp was found in the sigmoid colon. The polyp was       sessile. The polyp was removed with a jumbo cold forceps. Resection and       retrieval were complete. Impression:           - Diverticulosis in the sigmoid colon and in the                        descending colon.                       - Internal hemorrhoids.                       - No specimens collected. Recommendation:       - Await pathology results. Antibiotic for 7-10 days. Manya Silvas, MD 06/10/2015 8:53:33 AM This report has been signed electronically. Number of Addenda: 0 Note Initiated On: 06/10/2015 8:25 AM Scope Withdrawal Time: 0 hours 10 minutes 48 seconds  Total Procedure Duration: 0 hours 18 minutes 37 seconds       [facility]

## 2015-06-10 NOTE — Transfer of Care (Signed)
Immediate Anesthesia Transfer of Care Note  Patient: Taylor Austin  Procedure(s) Performed: Procedure(s): COLONOSCOPY WITH PROPOFOL (N/A)  Patient Location: Endoscopy Unit  Anesthesia Type:General  Level of Consciousness: awake  Airway & Oxygen Therapy: Patient Spontanous Breathing and Patient connected to nasal cannula oxygen  Post-op Assessment: Report given to RN and Post -op Vital signs reviewed and stable  Post vital signs: Reviewed  Last Vitals:  Filed Vitals:   06/10/15 0706 06/10/15 0851  BP: 141/54 115/99  Pulse: 36 77  Temp:  35.7 C  Resp:  18    Last Pain: There were no vitals filed for this visit.       Complications: No apparent anesthesia complications

## 2015-06-10 NOTE — Anesthesia Preprocedure Evaluation (Addendum)
Anesthesia Evaluation  Patient identified by MRN, date of birth, ID band Patient awake    Reviewed: Allergy & Precautions, NPO status , Patient's Chart, lab work & pertinent test results, reviewed documented beta blocker date and time   Airway Mallampati: III  TM Distance: >3 FB     Dental  (+) Chipped   Pulmonary shortness of breath, sleep apnea and Continuous Positive Airway Pressure Ventilation , former smoker,           Cardiovascular hypertension, Pt. on medications + CAD       Neuro/Psych PSYCHIATRIC DISORDERS Depression  Neuromuscular disease    GI/Hepatic GERD  ,  Endo/Other  diabetes  Renal/GU Renal disease     Musculoskeletal  (+) Arthritis ,   Abdominal   Peds  Hematology   Anesthesia Other Findings Obese. Neck movement OK. SOB. Cardiology consult obtained, Dr Nehemiah Massed. Pt sis obese. Uses CPAP. Hx of multiple PVCs. Cardiac stent 2004 patent. Cardiac cath done 6 months ago. EKG obtained with no change from previous. Being followed by [facility].  Reproductive/Obstetrics                            Anesthesia Physical Anesthesia Plan  ASA: III  Anesthesia Plan: General   Post-op Pain Management:    Induction: Intravenous  Airway Management Planned: Nasal Cannula  Additional Equipment:   Intra-op Plan:   Post-operative Plan:   Informed Consent: I have reviewed the patients History and Physical, chart, labs and discussed the procedure including the risks, benefits and alternatives for the proposed anesthesia with the patient or authorized representative who has indicated his/her understanding and acceptance.     Plan Discussed with: CRNA  Anesthesia Plan Comments:         Anesthesia Quick Evaluation

## 2015-06-10 NOTE — Anesthesia Postprocedure Evaluation (Signed)
Anesthesia Post Note  Patient: Taylor Austin  Procedure(s) Performed: Procedure(s) (LRB): COLONOSCOPY WITH PROPOFOL (N/A)  Patient location during evaluation: Endoscopy Anesthesia Type: General Level of consciousness: awake and alert Pain management: pain level controlled Vital Signs Assessment: post-procedure vital signs reviewed and stable Respiratory status: spontaneous breathing, nonlabored ventilation, respiratory function stable and patient connected to nasal cannula oxygen Cardiovascular status: blood pressure returned to baseline and stable Postop Assessment: no signs of nausea or vomiting Anesthetic complications: no    Last Vitals:  Filed Vitals:   06/10/15 0911 06/10/15 0921  BP: 120/57 103/59  Pulse: 72 68  Temp:    Resp: 28 17    Last Pain: There were no vitals filed for this visit.               Beatrice Sehgal S

## 2015-06-10 NOTE — H&P (Signed)
Primary Care Physician:  Juluis Pitch, MD Primary Gastroenterologist:  Dr. Vira Agar  Pre-Procedure History & Physical: HPI:  Taylor Austin is a 75 y.o. male is here for an colonoscopy.   Past Medical History  Diagnosis Date  . OAB (overactive bladder)   . Back pain   . Allergy   . Diabetes mellitus without complication ([place])   . Diverticulitis   . Sleep apnea   . Hyperlipidemia   . Coronary artery disease   . Barrett esophagus   . Rheumatic fever   . Arthritis   . Vitamin B12 deficiency   . Low testosterone   . GERD (gastroesophageal reflux disease)   . Hypertension   . Hypersomnia   . Chronic kidney disease   . Kidney stones   . Depression   . BPH (benign prostatic hyperplasia)   . Dysphagia   . Esophageal reflux     Past Surgical History  Procedure Laterality Date  . Lumbar spine surgery    . Cervical spine surgery    . Thoracic spine surgery    . Colonoscopy    . Esophagogastroduodenoscopy endoscopy    . Carotid stent    . Back surgery    . Tonsillectomy    . Coronary angioplasty    . Cardiac catheterization  08/11/2014    Procedure: Left Heart Cath and Coronary Angiography;  Surgeon: Teodoro Spray, MD;  Location: [place] CV LAB;  Service: Cardiovascular;;  . Cardiac catheterization N/A 11/17/2014    Procedure: Right Heart Cath;  Surgeon: Teodoro Spray, MD;  Location: [place] CV LAB;  Service: Cardiovascular;  Laterality: N/A;    Prior to Admission medications   Medication Sig Start Date End Date Taking? Authorizing Provider  finasteride (PROSCAR) 5 MG tablet TAKE ONE TABLET BY MOUTH ONCE DAILY 01/05/15  Yes Shannon A McGowan, PA-C  glyBURIDE-metformin (GLUCOVANCE) 5-500 MG per tablet TAKE ONE TABLET BY MOUTH TWICE DAILY WITH MEALS 04/01/14  Yes Historical Provider, MD  magnesium oxide (MAG-OX) 400 MG tablet Take 400 mg by mouth daily.   Yes Historical Provider, MD  metoprolol succinate (TOPROL-XL) 25 MG 24 hr tablet TAKE ONE TABLET BY MOUTH  ONCE DAILY 04/23/14  Yes Historical Provider, MD  Multiple Vitamin (MULTI-VITAMINS) TABS Take 1 tablet by mouth daily.    Yes Historical Provider, MD  omeprazole (PRILOSEC) 40 MG capsule Take 40 mg by mouth daily.   Yes Historical Provider, MD  pioglitazone (ACTOS) 45 MG tablet Take 22.5 mg by mouth 2 (two) times daily.  04/16/14  Yes Historical Provider, MD  simvastatin (ZOCOR) 40 MG tablet Take 40 mg by mouth daily at 6 PM.  07/02/14  Yes Historical Provider, MD  fluticasone-salmeterol (ADVAIR HFA) 115-21 MCG/ACT inhaler Inhale 2 puffs into the lungs 2 (two) times daily. Reported on 03/01/2015    Historical Provider, MD  glucose blood (BAYER CONTOUR TEST) test strip  11/03/14 11/03/15  Historical Provider, MD  losartan-hydrochlorothiazide (HYZAAR) 100-25 MG per tablet Take 1 tablet by mouth daily.  06/24/14   Historical Provider, MD  mirabegron ER (MYRBETRIQ) 50 MG TB24 tablet Take 1 tablet (50 mg total) by mouth daily. 03/01/15   Hollice Espy, MD  oxymetazoline (AFRIN) 0.05 % nasal spray Place 1 spray into both nostrils 2 (two) times daily.    Historical Provider, MD    Allergies as of 05/30/2015 - Review Complete 03/03/2015  Allergen Reaction Noted  . Hydrocodone Shortness Of Breath 07/14/2014  . Oxycodone-acetaminophen Shortness Of Breath 07/14/2014  Family History  Problem Relation Age of Onset  . Stroke Father   . Anesthesia problems Father   . Anesthesia problems Mother   . Prostate cancer Brother   . Kidney disease Neg Hx     Social History   Social History  . Marital Status: Married    Spouse Name: N/A  . Number of Children: N/A  . Years of Education: N/A   Occupational History  . Not on file.   Social History Main Topics  . Smoking status: Former Smoker    Types: Cigarettes    Quit date: 07/13/1973  . Smokeless tobacco: Not on file     Comment: reports he smoked 4 packs a day for 10 years and quit in 1975  . Alcohol Use: 0.6 oz/week    1 Standard drinks or  equivalent per week     Comment: rarely drinks, for holidays  . Drug Use: No  . Sexual Activity: Not on file   Other Topics Concern  . Not on file   Social History Narrative    Review of Systems: See HPI, otherwise negative ROS  Physical Exam: BP 141/54 mmHg  Pulse 36  Temp(Src) 98 F (36.7 C) (Tympanic)  Resp 16  Ht 5' 6.5" (1.689 m)  Wt 114.306 kg (252 lb)  BMI 40.07 kg/m2  SpO2 96% General:   Alert,  pleasant and cooperative in NAD Head:  Normocephalic and atraumatic. Neck:  Supple; no masses or thyromegaly. Lungs:  Clear throughout to auscultation.   Decreased breath sounds global Heart:  Regular rate and rhythm. Bradycardia at times Abdomen:  Soft, nontender and nondistended. Normal bowel sounds, without guarding, and without rebound.   Neurologic:  Alert and  oriented x4;  grossly normal neurologically.  Impression/Plan: Taylor Austin is here for an colonoscopy to be performed for LLQ abdominal pain   Risks, benefits, limitations, and alternatives regarding  colonoscopy have been reviewed with the patient.  Questions have been answered.  All parties agreeable.   Gaylyn Cheers, MD  06/10/2015, 8:20 AM

## 2015-06-10 NOTE — Consult Note (Signed)
[facility] Consultation Note  Patient ID: KAMRIN STANBERY, MRN: [id number], DOB/AGE: 1940/08/14 75 y.o. Admit date: 06/10/2015   Date of Consult: 06/10/2015 Primary Physician: Juluis Pitch, MD Primary Cardiologist: Fath  Chief Complaint: No chief complaint on file.  Reason for Consult: bradycardia with severe shortness of breath  HPI: 75 y.o. male with known coronary artery disease status post previous PCI and stent placement of left anterior descending artery with recent cardiac catheterization showing patent stent and no other significant coronary disease. Patient also had a right heart catheter showing mild elevation of pulmonary pressures and most consistent with COPD and sleep apnea. The patient has had appropriate medication management including beta blocker and other angiotensin receptor blocker for coronary artery disease and has worked out fairly well. The patient does have some variable heart rate today with bradycardia into the 40 beat per minute range and sometimes into the 60 beat per minute range. This is without any significant symptoms and likely secondary to beta blocker given yesterday. The patient has had severe shortness of breath with physical activity walking to the mailbox multifactorial in nature including sleep apnea COPD as well as significant weight for which the patient is trying to lose weight. He has not had any true angina at this time or hemodynamic instability. Previous cardiac catheterization suggested patient would still be at low risk for cardiovascular complication with colonoscopy and could use multiple medications for treatment. Currently EKG has shown normal sinus rhythm cannot rule out inferior and anterior infarct age undetermined which is unchanged from EKG 3 months prior.  Past Medical History  Diagnosis Date  . OAB (overactive bladder)   . Back pain   . Allergy   . Diabetes mellitus without complication ([place])   . Diverticulitis   .  Sleep apnea   . Hyperlipidemia   . Coronary artery disease   . Barrett esophagus   . Rheumatic fever   . Arthritis   . Vitamin B12 deficiency   . Low testosterone   . GERD (gastroesophageal reflux disease)   . Hypertension   . Hypersomnia   . Chronic kidney disease   . Kidney stones   . Depression   . BPH (benign prostatic hyperplasia)   . Dysphagia   . Esophageal reflux       Surgical History:  Past Surgical History  Procedure Laterality Date  . Lumbar spine surgery    . Cervical spine surgery    . Thoracic spine surgery    . Colonoscopy    . Esophagogastroduodenoscopy endoscopy    . Carotid stent    . Back surgery    . Tonsillectomy    . Coronary angioplasty    . Cardiac catheterization  08/11/2014    Procedure: Left Heart Cath and Coronary Angiography;  Surgeon: Teodoro Spray, MD;  Location: [place] [facility];  Service: Cardiovascular;;  . Cardiac catheterization N/A 11/17/2014    Procedure: Right Heart Cath;  Surgeon: Teodoro Spray, MD;  Location: [place] [facility];  Service: Cardiovascular;  Laterality: N/A;     Home Meds: Prior to Admission medications   Medication Sig Start Date End Date Taking? Authorizing Provider  finasteride (PROSCAR) 5 MG tablet TAKE ONE TABLET BY MOUTH ONCE DAILY 01/05/15  Yes Shannon A McGowan, PA-C  glyBURIDE-metformin (GLUCOVANCE) 5-500 MG per tablet TAKE ONE TABLET BY MOUTH TWICE DAILY WITH MEALS 04/01/14  Yes Historical Provider, MD  magnesium oxide (MAG-OX) 400 MG tablet Take 400 mg by mouth daily.  Yes Historical Provider, MD  metoprolol succinate (TOPROL-XL) 25 MG 24 hr tablet TAKE ONE TABLET BY MOUTH ONCE DAILY 04/23/14  Yes Historical Provider, MD  Multiple Vitamin (MULTI-VITAMINS) TABS Take 1 tablet by mouth daily.    Yes Historical Provider, MD  omeprazole (PRILOSEC) 40 MG capsule Take 40 mg by mouth daily.   Yes Historical Provider, MD  pioglitazone (ACTOS) 45 MG tablet Take 22.5 mg by mouth 2 (two) times daily.  04/16/14  Yes  Historical Provider, MD  simvastatin (ZOCOR) 40 MG tablet Take 40 mg by mouth daily at 6 PM.  07/02/14  Yes Historical Provider, MD  fluticasone-salmeterol (ADVAIR HFA) 115-21 MCG/ACT inhaler Inhale 2 puffs into the lungs 2 (two) times daily. Reported on 03/01/2015    Historical Provider, MD  glucose blood (BAYER CONTOUR TEST) test strip  11/03/14 11/03/15  Historical Provider, MD  losartan-hydrochlorothiazide (HYZAAR) 100-25 MG per tablet Take 1 tablet by mouth daily.  06/24/14   Historical Provider, MD  mirabegron ER (MYRBETRIQ) 50 MG TB24 tablet Take 1 tablet (50 mg total) by mouth daily. 03/01/15   Hollice Espy, MD  oxymetazoline (AFRIN) 0.05 % nasal spray Place 1 spray into both nostrils 2 (two) times daily.    Historical Provider, MD    Inpatient Medications:    . sodium chloride 20 mL/hr at 06/10/15 0810    Allergies:  Allergies  Allergen Reactions  . Hydrocodone Shortness Of Breath  . Oxycodone-Acetaminophen Shortness Of Breath    Social History   Social History  . Marital Status: Married    Spouse Name: N/A  . Number of Children: N/A  . Years of Education: N/A   Occupational History  . Not on file.   Social History Main Topics  . Smoking status: Former Smoker    Types: Cigarettes    Quit date: 07/13/1973  . Smokeless tobacco: Not on file     Comment: reports he smoked 4 packs a day for 10 years and quit in 1975  . Alcohol Use: 0.6 oz/week    1 Standard drinks or equivalent per week     Comment: rarely drinks, for holidays  . Drug Use: No  . Sexual Activity: Not on file   Other Topics Concern  . Not on file   Social History Narrative     Family History  Problem Relation Age of Onset  . Stroke Father   . Anesthesia problems Father   . Anesthesia problems Mother   . Prostate cancer Brother   . Kidney disease Neg Hx      Review of Systems Positive for Shortness of breath Negative for: General:  chills, fever, night sweats or weight changes.   Cardiovascular: PND orthopnea syncope dizziness  Dermatological skin lesions rashes Respiratory: Cough congestion Urologic: Frequent urination urination at night and hematuria Abdominal: negative for nausea, vomiting, diarrhea, bright red blood per rectum, melena, or hematemesis Neurologic: negative for visual changes, and/or hearing changes  All other systems reviewed and are otherwise negative except as noted above.  Labs: No results for input(s): CKTOTAL, CKMB, TROPONINI in the last 72 hours. Lab Results  Component Value Date   WBC 8.9 01/04/2014   HGB 14.4 01/04/2014   HCT 43.9 01/04/2014   MCV 97 01/04/2014   PLT 161 01/04/2014   No results for input(s): NA, K, CL, CO2, BUN, CREATININE, CALCIUM, PROT, BILITOT, ALKPHOS, ALT, AST, GLUCOSE in the last 168 hours.  Invalid input(s): LABALBU No results found for: CHOL, HDL, LDLCALC, TRIG No results found  for: DDIMER  Radiology/Studies:  No results found.  EKG: Normal sinus rhythm with poor R-wave progression cannot rule out anterior or inferior infarct age undetermined  Weights: Filed Weights   06/10/15 0703  Weight: 252 lb (114.306 kg)     Physical Exam: Blood pressure 141/54, pulse 36, temperature 98 F (36.7 C), temperature source Tympanic, resp. rate 16, height 5' 6.5" (1.689 m), weight 252 lb (114.306 kg), SpO2 96 %. Body mass index is 40.07 kg/(m^2). General: Well developed, well nourished, in no acute distress. Head eyes ears nose throat: Normocephalic, atraumatic, sclera non-icteric, no xanthomas, nares are without discharge. No apparent thyromegaly and/or mass  Lungs: Normal respiratory effort.  no wheezes, no rales, no rhonchi.  Heart: RRR with normal S1 S2. no murmur gallop, no rub, PMI is normal size and placement, carotid upstroke normal without bruit, jugular venous pressure is normal Abdomen: Soft, non-tender, non-distended with normoactive bowel sounds. No hepatomegaly. No rebound/guarding. No obvious  abdominal masses. Abdominal aorta is normal size without bruit Extremities: No edema. no cyanosis, no clubbing, no ulcers  Peripheral : 2+ bilateral upper extremity pulses, 2+ bilateral femoral pulses, 2+ bilateral dorsal pedal pulse Neuro: Alert and oriented. No facial asymmetry. No focal deficit. Moves all extremities spontaneously. Musculoskeletal: Normal muscle tone without kyphosis Psych:  Responds to questions appropriately with a normal affect.    Assessment: 75 year old male with known coronary artery disease with patent stent by cardiac catheterization and pulmonary hypertension most consistent with COPD and sleep apnea with shortness of breath stable at this time and no current evidence of true angina and/or congestive heart failure at lowest risk possible for cardiovascular complication with colonoscopy  Plan: 1. Abstain from beta blocker due to concerns of bradycardia and use other angiotensin receptor blocker for treatment of the hypertension 2. Proceed to colonoscopy without restriction due to low risk 3. Use of the medications for pressure and/or heart rate support as necessary 4. Further treatment and evaluation of shortness of breath most consistent with the COPD and sleep apnea as outpatient 5. No further cardiac diagnostics necessary at this time  Signed, Corey Skains M.D. [facility] 06/10/2015, 8:10 AM

## 2015-06-13 ENCOUNTER — Encounter: Payer: Self-pay | Admitting: Unknown Physician Specialty

## 2015-08-02 LAB — SURGICAL PATHOLOGY

## 2015-08-29 ENCOUNTER — Ambulatory Visit: Payer: Medicare Other | Admitting: Urology

## 2015-08-29 ENCOUNTER — Encounter: Payer: Self-pay | Admitting: Urology

## 2015-11-21 ENCOUNTER — Ambulatory Visit (INDEPENDENT_AMBULATORY_CARE_PROVIDER_SITE_OTHER): Payer: Medicare Other | Admitting: Urology

## 2015-11-21 ENCOUNTER — Encounter: Payer: Self-pay | Admitting: Urology

## 2015-11-21 VITALS — BP 121/74 | HR 98 | Ht 66.5 in | Wt 268.4 lb

## 2015-11-21 DIAGNOSIS — N3281 Overactive bladder: Secondary | ICD-10-CM

## 2015-11-21 DIAGNOSIS — N400 Enlarged prostate without lower urinary tract symptoms: Secondary | ICD-10-CM | POA: Diagnosis not present

## 2015-11-21 DIAGNOSIS — N138 Other obstructive and reflux uropathy: Secondary | ICD-10-CM

## 2015-11-21 DIAGNOSIS — Z87898 Personal history of other specified conditions: Secondary | ICD-10-CM

## 2015-11-21 DIAGNOSIS — N401 Enlarged prostate with lower urinary tract symptoms: Secondary | ICD-10-CM | POA: Diagnosis not present

## 2015-11-21 LAB — BLADDER SCAN AMB NON-IMAGING: SCAN RESULT: 0

## 2015-11-21 MED ORDER — FINASTERIDE 5 MG PO TABS: 5.0000 mg | ORAL_TABLET | Freq: Every day | ORAL | 4 refills | Status: DC

## 2015-11-21 NOTE — Progress Notes (Signed)
11/21/2015 2:24 PM   Taylor Austin 12-03-1940 [id number]  Referring provider: Juluis Pitch, MD [phone] [place], [place] [address]  Chief Complaint  Patient presents with  . Benign Prostatic Hypertrophy    6 month follow up  . Over Active Bladder    HPI: Patient is 75 year old Caucasian male with OAB, a history of elevated PSA and BPH with LUTS who presents today for 6 month follow-up.  OAB When patient was last seen 6 months ago, he was given a trial of Myrbetriq 50 mg daily. He took the samples, but he did not contact the office regarding whether or not he found the medications effective.  He shouldn't does not recollect whether the Myrbetriq was effective in controlling his overactive bladder symptoms.  Today, he complains of frequency, urgency, nocturia, incontinence, intermittency and a weak urinary stream. He also suffers with constipation. His PVR today is 0 mL.  He is sleeping with a CPAP machine.    History of elevated PSA History of rising PSA.  Most recent PSA's have stabilized 1.4.  Brother has a history of prostate cancer.  BPH WITH LUTS His IPSS score today is 28, which is severe lower urinary tract symptomatology. He is mostly dissatisfied with his quality life due to his urinary symptoms. His PVR is 0 mL.  His major complaints today are listed under OAB.  He has had these symptoms for the last several years.  He denies any dysuria, hematuria or suprapubic pain.  He currently taking finasteride 5 mg daily.  He also denies any recent fevers, chills, nausea or vomiting.       IPSS    Row Name 11/21/15 1400         International Prostate Symptom Score   How often have you had the sensation of not emptying your bladder? About half the time     How often have you had to urinate less than every two hours? Almost always     How often have you found you stopped and started again several times when you urinated? Almost always     How often have you found it  difficult to postpone urination? Almost always     How often have you had a weak urinary stream? Almost always     How often have you had to strain to start urination? Not at All     How many times did you typically get up at night to urinate? 5 Times     Total IPSS Score 28       Quality of Life due to urinary symptoms   If you were to spend the rest of your life with your urinary condition just the way it is now how would you feel about that? Mostly Disatisfied        Score:  1-7 Mild 8-19 Moderate 20-35 Severe   PMH: Past Medical History:  Diagnosis Date  . Allergy   . Arthritis   . Back pain   . Barrett esophagus   . BPH (benign prostatic hyperplasia)   . Chronic kidney disease   . Coronary artery disease   . Depression   . Diabetes mellitus without complication ([place])   . Diverticulitis   . Dysphagia   . Esophageal reflux   . GERD (gastroesophageal reflux disease)   . Hyperlipidemia   . Hypersomnia   . Hypertension   . Kidney stones   . Low testosterone   . OAB (overactive bladder)   .  Rheumatic fever   . Sleep apnea   . Vitamin B12 deficiency     Surgical History: Past Surgical History:  Procedure Laterality Date  . BACK SURGERY    . CARDIAC CATHETERIZATION  08/11/2014   Procedure: Left Heart Cath and Coronary Angiography;  Surgeon: Teodoro Spray, MD;  Location: [place] [facility];  Service: Cardiovascular;;  . CARDIAC CATHETERIZATION N/A 11/17/2014   Procedure: Right Heart Cath;  Surgeon: Teodoro Spray, MD;  Location: [place];  Service: Cardiovascular;  Laterality: N/A;  . CAROTID STENT    . CERVICAL SPINE SURGERY    . COLONOSCOPY    . COLONOSCOPY WITH PROPOFOL N/A 06/10/2015   Procedure: COLONOSCOPY WITH PROPOFOL;  Surgeon: Manya Silvas, MD;  Location: [facility];  Service: Endoscopy;  Laterality: N/A;  . CORONARY ANGIOPLASTY    . ESOPHAGOGASTRODUODENOSCOPY ENDOSCOPY    . LUMBAR SPINE SURGERY    . THORACIC SPINE SURGERY    .  TONSILLECTOMY      Home Medications:    Medication List       Accurate as of 11/21/15  2:24 PM. Always use your most recent med list.          ANORO ELLIPTA 62.5-25 MCG/INH Aepb Generic drug:  umeclidinium-vilanterol Inhale 1 puff into the lungs daily.   finasteride 5 MG tablet Commonly known as:  PROSCAR Take 1 tablet (5 mg total) by mouth daily.   fluticasone-salmeterol 115-21 MCG/ACT inhaler Commonly known as:  ADVAIR HFA Inhale 2 puffs into the lungs 2 (two) times daily. Reported on 03/01/2015   glyBURIDE-metformin 5-500 MG tablet Commonly known as:  GLUCOVANCE TAKE ONE TABLET BY MOUTH TWICE DAILY WITH MEALS   losartan-hydrochlorothiazide 100-25 MG tablet Commonly known as:  HYZAAR Take 1 tablet by mouth daily.   magnesium oxide 400 MG tablet Commonly known as:  MAG-OX Take 400 mg by mouth daily.   metoprolol succinate 25 MG 24 hr tablet Commonly known as:  TOPROL-XL TAKE ONE TABLET BY MOUTH ONCE DAILY   mirabegron ER 50 MG Tb24 tablet Commonly known as:  MYRBETRIQ Take 1 tablet (50 mg total) by mouth daily.   MULTI-VITAMINS Tabs Take 1 tablet by mouth daily.   omeprazole 40 MG capsule Commonly known as:  PRILOSEC Take 40 mg by mouth daily.   oxymetazoline 0.05 % nasal spray Commonly known as:  AFRIN Place 1 spray into both nostrils 2 (two) times daily.   pioglitazone 45 MG tablet Commonly known as:  ACTOS Take 22.5 mg by mouth 2 (two) times daily.   PROBIOTIC PO Take by mouth.   simvastatin 40 MG tablet Commonly known as:  ZOCOR Take 40 mg by mouth daily at 6 PM.   VITAMIN B-12 PO Take by mouth.       Allergies:  Allergies  Allergen Reactions  . Hydrocodone Shortness Of Breath  . Oxycodone-Acetaminophen Shortness Of Breath    Family History: Family History  Problem Relation Age of Onset  . Stroke Father   . Anesthesia problems Father   . Anesthesia problems Mother   . Prostate cancer Brother   . Kidney disease Neg Hx      Social History:  reports that he quit smoking about 42 years ago. His smoking use included Cigarettes. He has never used smokeless tobacco. He reports that he drinks about 0.6 oz of alcohol per week . He reports that he does not use drugs.  ROS: UROLOGY Frequent Urination?: Yes Hard to postpone urination?: Yes Burning/pain  with urination?: No Get up at night to urinate?: Yes Leakage of urine?: Yes Urine stream starts and stops?: Yes Trouble starting stream?: No Do you have to strain to urinate?: No Blood in urine?: No Urinary tract infection?: No Sexually transmitted disease?: No Injury to kidneys or bladder?: No Painful intercourse?: No Weak stream?: Yes Erection problems?: Yes Penile pain?: No  Gastrointestinal Nausea?: No Vomiting?: No Indigestion/heartburn?: No Diarrhea?: No Constipation?: Yes  Constitutional Fever: No Night sweats?: No Weight loss?: No Fatigue?: No  Skin Skin rash/lesions?: No Itching?: No  Eyes Blurred vision?: Yes Double vision?: No  Ears/Nose/Throat Sore throat?: No Sinus problems?: No  Hematologic/Lymphatic Swollen glands?: No Easy bruising?: No  Cardiovascular Leg swelling?: No Chest pain?: No  Respiratory Cough?: No Shortness of breath?: Yes  Endocrine Excessive thirst?: No  Musculoskeletal Back pain?: Yes Joint pain?: Yes  Neurological Headaches?: No Dizziness?: Yes  Psychologic Depression?: No Anxiety?: No  Physical Exam: BP 121/74   Pulse 98   Ht 5' 6.5" (1.689 m)   Wt 268 lb 6.4 oz (121.7 kg)   BMI 42.67 kg/m   Constitutional: Well nourished. Alert and oriented, No acute distress. HEENT: [place] AT, moist mucus membranes. Trachea midline, no masses. Cardiovascular: No clubbing, cyanosis, or edema. Respiratory: Normal respiratory effort, no increased work of breathing. GI: Abdomen is soft, non tender, non distended, no abdominal masses. Liver and spleen not palpable.  No hernias appreciated.  Stool  sample for occult testing is not indicated.   GU: No CVA tenderness.  No bladder fullness or masses.  Patient with circumcised phallus.  Urethral meatus is patent.  No penile discharge. No penile lesions or rashes. Scrotum without lesions, cysts, rashes and/or edema.  Testicles are located scrotally bilaterally. No masses are appreciated in the testicles. Left and right epididymis are normal. Rectal: Patient with  normal sphincter tone. Anus and perineum without scarring or rashes. No rectal masses are appreciated. Prostate is approximately 50 grams, no nodules are appreciated. Seminal vesicles are normal. Skin: No rashes, bruises or suspicious lesions. Lymph: No cervical or inguinal adenopathy. Neurologic: Grossly intact, no focal deficits, moving all 4 extremities. Psychiatric: Normal mood and affect.  Laboratory Data: PSA History  1.3 ng/mL on 02/2014  1.4 ng/mL on 03/01/2015   Lab Results  Component Value Date   WBC 8.9 01/04/2014   HGB 14.4 01/04/2014   HCT 43.9 01/04/2014   MCV 97 01/04/2014   PLT 161 01/04/2014    Lab Results  Component Value Date   CREATININE 0.97 01/04/2014    Lab Results  Component Value Date   AST 25 01/04/2014   Lab Results  Component Value Date   ALT 28 01/04/2014    Pertinent Imaging: Results for SHAWNE, BRESLER (MRN [id number]) as of 11/30/2015 14:41  Ref. Range 11/21/2015 13:58  Scan Result Unknown 0    Assessment & Plan:    1. OAB  - restart Myrbetriq 50 mg daily, reviewed side effects, # 28 samples given  - BLADDER SCAN AMB NON-IMAGING  - RTC in 3 weeks for IPSS and PVR  2. History of elevated PSA  - PSA drawn today   - continue yearly screenings due to finasteride medication  3. BPH with LUTS  - IPSS score is 28/4  - Continue conservative management, avoiding bladder irritants and timed voiding's  - Continue finasteride 5 mg daily; refills given   - RTC in 3 weeks for IPSS and PVR   - PSA    Return in about  3 weeks  (around 12/12/2015) for IPSS and PVR.  These notes generated with voice recognition software. I apologize for typographical errors.  Zara Council, [facility] [address] [place], [place] [address] [phone]

## 2015-11-22 ENCOUNTER — Telehealth: Payer: Self-pay

## 2015-11-22 LAB — PSA: PROSTATE SPECIFIC AG, SERUM: 1.7 ng/mL (ref 0.0–4.0)

## 2015-11-22 NOTE — Telephone Encounter (Signed)
Spoke with pt wife in reference to PSA results. Wife voiced understanding.  

## 2015-11-22 NOTE — Telephone Encounter (Signed)
-----   Message from Nori Riis, PA-C sent at 11/22/2015  8:24 AM EST ----- Please notify the patient that his PSA is stable.

## 2015-12-12 ENCOUNTER — Encounter: Payer: Self-pay | Admitting: Urology

## 2015-12-12 ENCOUNTER — Ambulatory Visit (INDEPENDENT_AMBULATORY_CARE_PROVIDER_SITE_OTHER): Payer: Medicare Other | Admitting: Urology

## 2015-12-12 VITALS — BP 183/96 | HR 70 | Ht 66.5 in | Wt 275.4 lb

## 2015-12-12 DIAGNOSIS — N400 Enlarged prostate without lower urinary tract symptoms: Secondary | ICD-10-CM

## 2015-12-12 DIAGNOSIS — Z87898 Personal history of other specified conditions: Secondary | ICD-10-CM | POA: Diagnosis not present

## 2015-12-12 DIAGNOSIS — N138 Other obstructive and reflux uropathy: Secondary | ICD-10-CM

## 2015-12-12 DIAGNOSIS — N3281 Overactive bladder: Secondary | ICD-10-CM

## 2015-12-12 DIAGNOSIS — N401 Enlarged prostate with lower urinary tract symptoms: Secondary | ICD-10-CM | POA: Diagnosis not present

## 2015-12-12 LAB — BLADDER SCAN AMB NON-IMAGING: SCAN RESULT: 91

## 2015-12-12 NOTE — Progress Notes (Signed)
12/12/2015 11:07 AM   Taylor Austin Chauncey Cruel [birth year] [id number]  Referring provider: Juluis Pitch, MD [id number] [place], [place] [address]  Chief Complaint  Patient presents with  . Over Active Bladder    3 week follow up  . Benign Prostatic Hypertrophy    HPI: Patient is 75 year old Caucasian male with OAB, a history of elevated PSA and BPH with LUTS who presents today for 3 weeks follow up after restarting his Myrbetriq.    OAB He states that the Myrbetriq helped, but his BP was 183/96.  Today, he complains of frequency, urgency, nocturia, incontinence and a weak urinary stream.  He also suffers with constipation.   He is sleeping with a CPAP machine.    History of elevated PSA History of rising PSA.  Most recent PSA's have stabilized 1.4.  Brother has a history of prostate cancer.  BPH WITH LUTS His IPSS score today is 22, which is severe lower urinary tract symptomatology. He is mixed with his quality life due to his urinary symptoms. His PVR is 91 mL.  His previous IPSS score was 28/4.  His previous PVR was 0 mL.  His major complaints today are listed under OAB.  He has had these symptoms for the last several years.  He denies any dysuria, hematuria or suprapubic pain.  He currently taking finasteride 5 mg daily.  He also denies any recent fevers, chills, nausea or vomiting.       IPSS    Row Name 11/21/15 1400 12/12/15 1000       International Prostate Symptom Score   How often have you had the sensation of not emptying your bladder? About half the time Less than half the time    How often have you had to urinate less than every two hours? Almost always Almost always    How often have you found you stopped and started again several times when you urinated? Almost always Less than half the time    How often have you found it difficult to postpone urination? Almost always Almost always    How often have you had a weak urinary stream? Almost always More than half the  time    How often have you had to strain to start urination? Not at All Not at All    How many times did you typically get up at night to urinate? 5 Times 4 Times    Total IPSS Score 28 22      Quality of Life due to urinary symptoms   If you were to spend the rest of your life with your urinary condition just the way it is now how would you feel about that? Mostly Disatisfied Mixed       Score:  1-7 Mild 8-19 Moderate 20-35 Severe   PMH: Past Medical History:  Diagnosis Date  . Allergy   . Arthritis   . Back pain   . Barrett esophagus   . BPH (benign prostatic hyperplasia)   . Chronic kidney disease   . Coronary artery disease   . Depression   . Diabetes mellitus without complication ([place])   . Diverticulitis   . Dysphagia   . Esophageal reflux   . GERD (gastroesophageal reflux disease)   . Hyperlipidemia   . Hypersomnia   . Hypertension   . Kidney stones   . Low testosterone   . OAB (overactive bladder)   . Rheumatic fever   . Sleep apnea   . Vitamin B12  deficiency     Surgical History: Past Surgical History:  Procedure Laterality Date  . BACK SURGERY    . CARDIAC CATHETERIZATION  08/11/2014   Procedure: Left Heart Cath and Coronary Angiography;  Surgeon: Teodoro Spray, MD;  Location: [place] [facility];  Service: Cardiovascular;;  . CARDIAC CATHETERIZATION N/A 11/17/2014   Procedure: Right Heart Cath;  Surgeon: Teodoro Spray, MD;  Location: [place] [facility];  Service: Cardiovascular;  Laterality: N/A;  . CAROTID STENT    . CERVICAL SPINE SURGERY    . COLONOSCOPY    . COLONOSCOPY WITH PROPOFOL N/A 06/10/2015   Procedure: COLONOSCOPY WITH PROPOFOL;  Surgeon: Manya Silvas, MD;  Location[facility];  Service: Endoscopy;  Laterality: N/A;  . CORONARY ANGIOPLASTY    . ESOPHAGOGASTRODUODENOSCOPY ENDOSCOPY    . LUMBAR SPINE SURGERY    . THORACIC SPINE SURGERY    . TONSILLECTOMY      Home Medications:    Medication List       Accurate as of  12/12/15 11:07 AM. Always use your most recent med list.          ANORO ELLIPTA 62.5-25 MCG/INH Aepb Generic drug:  umeclidinium-vilanterol Inhale 1 puff into the lungs daily.   finasteride 5 MG tablet Commonly known as:  PROSCAR Take 1 tablet (5 mg total) by mouth daily.   fluticasone-salmeterol 115-21 MCG/ACT inhaler Commonly known as:  ADVAIR HFA Inhale 2 puffs into the lungs 2 (two) times daily. Reported on 03/01/2015   glyBURIDE-metformin 5-500 MG tablet Commonly known as:  GLUCOVANCE TAKE ONE TABLET BY MOUTH TWICE DAILY WITH MEALS   losartan-hydrochlorothiazide 100-25 MG tablet Commonly known as:  HYZAAR Take 1 tablet by mouth daily.   magnesium oxide 400 MG tablet Commonly known as:  MAG-OX Take 400 mg by mouth daily.   metoprolol succinate 25 MG 24 hr tablet Commonly known as:  TOPROL-XL TAKE ONE TABLET BY MOUTH ONCE DAILY   mirabegron ER 50 MG Tb24 tablet Commonly known as:  MYRBETRIQ Take 1 tablet (50 mg total) by mouth daily.   MULTI-VITAMINS Tabs Take 1 tablet by mouth daily.   omeprazole 40 MG capsule Commonly known as:  PRILOSEC Take 40 mg by mouth daily.   oxymetazoline 0.05 % nasal spray Commonly known as:  AFRIN Place 1 spray into both nostrils 2 (two) times daily.   pioglitazone 45 MG tablet Commonly known as:  ACTOS Take 22.5 mg by mouth 2 (two) times daily.   PROBIOTIC PO Take by mouth.   simvastatin 40 MG tablet Commonly known as:  ZOCOR Take 40 mg by mouth daily at 6 PM.   VITAMIN B-12 PO Take by mouth.       Allergies:  Allergies  Allergen Reactions  . Hydrocodone Shortness Of Breath  . Oxycodone-Acetaminophen Shortness Of Breath    Family History: Family History  Problem Relation Age of Onset  . Stroke Father   . Anesthesia problems Father   . Anesthesia problems Mother   . Prostate cancer Brother   . Kidney disease Neg Hx   . Bladder Cancer Neg Hx     Social History:  reports that he quit smoking about 42 years  ago. His smoking use included Cigarettes. He has never used smokeless tobacco. He reports that he drinks about 0.6 oz of alcohol per week . He reports that he does not use drugs.  ROS: UROLOGY Frequent Urination?: Yes Hard to postpone urination?: Yes Burning/pain with urination?: No Get up at  night to urinate?: Yes Leakage of urine?: Yes Urine stream starts and stops?: No Trouble starting stream?: No Do you have to strain to urinate?: No Blood in urine?: No Urinary tract infection?: No Sexually transmitted disease?: No Injury to kidneys or bladder?: No Painful intercourse?: No Weak stream?: Yes Erection problems?: Yes Penile pain?: No  Gastrointestinal Nausea?: No Vomiting?: No Indigestion/heartburn?: No Diarrhea?: No Constipation?: No  Constitutional Fever: No Night sweats?: No Weight loss?: No Fatigue?: Yes  Skin Skin rash/lesions?: No Itching?: No  Eyes Blurred vision?: Yes Double vision?: No  Ears/Nose/Throat Sore throat?: No Sinus problems?: No  Hematologic/Lymphatic Swollen glands?: No Easy bruising?: Yes  Cardiovascular Leg swelling?: Yes Chest pain?: No  Respiratory Cough?: No Shortness of breath?: Yes  Endocrine Excessive thirst?: No  Musculoskeletal Back pain?: Yes Joint pain?: Yes  Neurological Headaches?: No Dizziness?: Yes  Psychologic Depression?: No Anxiety?: No  Physical Exam: BP (!) 183/96   Pulse 70   Ht 5' 6.5" (1.689 m)   Wt 275 lb 6.4 oz (124.9 kg)   BMI 43.78 kg/m   Constitutional: Well nourished. Alert and oriented, No acute distress. HEENT: [place] AT, moist mucus membranes. Trachea midline, no masses. Cardiovascular: No clubbing, cyanosis, or edema. Respiratory: Normal respiratory effort, no increased work of breathing. Skin: No rashes, bruises or suspicious lesions. Lymph: No cervical or inguinal adenopathy. Neurologic: Grossly intact, no focal deficits, moving all 4 extremities. Psychiatric: Normal mood and  affect.  Laboratory Data: PSA History  1.3 ng/mL on 02/2014  1.4 ng/mL on 03/01/2015  1.7 ng/mL on 11/21/2015 Lab Results  Component Value Date   WBC 8.9 01/04/2014   HGB 14.4 01/04/2014   HCT 43.9 01/04/2014   MCV 97 01/04/2014   PLT 161 01/04/2014    Lab Results  Component Value Date   CREATININE 0.97 01/04/2014    Lab Results  Component Value Date   AST 25 01/04/2014   Lab Results  Component Value Date   ALT 28 01/04/2014    Pertinent Imaging: Results for REUEL, PITTER (MRN [id number]) as of 12/12/2015 10:59  Ref. Range 12/12/2015 10:51  Scan Result Unknown 91     Assessment & Plan:    1. OAB  - restart Myrbetriq 50 mg daily- stated it helped, but BP was elevated at 183/96  - BLADDER SCAN AMB NON-IMAGING  - offered PT, but is not interested  - discussed PTNS, patient is interested and will schedule  2. History of elevated PSA  - current PSA was 1.7 ng/mL on 11/21/2015  - continue yearly screenings due to finasteride medication  3. BPH with LUTS  - IPSS score is 22/3  - Continue conservative management, avoiding bladder irritants and timed voiding's  - Continue finasteride 5 mg daily; refills given   - RTC in 6 for IPSS, PSA and PVR    Return for PTNS.  These notes generated with voice recognition software. I apologize for typographical errors.  Zara Council, [facility] [address] [place], [place] [address] [phone]

## 2016-01-09 DIAGNOSIS — I639 Cerebral infarction, unspecified: Secondary | ICD-10-CM

## 2016-01-09 HISTORY — DX: Cerebral infarction, unspecified: I63.9

## 2016-01-16 ENCOUNTER — Other Ambulatory Visit: Payer: Self-pay

## 2016-01-18 ENCOUNTER — Telehealth: Payer: Self-pay | Admitting: Urology

## 2016-01-18 DIAGNOSIS — R972 Elevated prostate specific antigen [PSA]: Secondary | ICD-10-CM

## 2016-01-18 NOTE — Telephone Encounter (Signed)
Patient had his PSA checked by his PCP in late December and it indicated a 23% probability of having prostate cancer.  He should have his PSA rechecked in 5 months.

## 2016-01-19 NOTE — Telephone Encounter (Signed)
LMOM

## 2016-01-20 NOTE — Telephone Encounter (Signed)
He cannot have Myrbetriq due to his elevated BP.  He can try Veiscare 5 mg daily or Toviaz 4 mg daily.

## 2016-01-20 NOTE — Telephone Encounter (Signed)
Spoke with pt in reference to PSA and needing it rechecked. Lab appt made and orders placed. Pt stated that at his last appt there was mention of PTNS and trying oral medications again for OAB. Pt stated that he would prefer to have oral medications again. Please advise.

## 2016-01-23 ENCOUNTER — Other Ambulatory Visit (INDEPENDENT_AMBULATORY_CARE_PROVIDER_SITE_OTHER): Payer: Self-pay | Admitting: Vascular Surgery

## 2016-01-23 DIAGNOSIS — I6529 Occlusion and stenosis of unspecified carotid artery: Secondary | ICD-10-CM

## 2016-01-24 ENCOUNTER — Ambulatory Visit (INDEPENDENT_AMBULATORY_CARE_PROVIDER_SITE_OTHER): Payer: Medicare Other

## 2016-01-24 DIAGNOSIS — I6529 Occlusion and stenosis of unspecified carotid artery: Secondary | ICD-10-CM | POA: Diagnosis not present

## 2016-01-30 ENCOUNTER — Emergency Department: Payer: Medicare Other

## 2016-01-30 ENCOUNTER — Inpatient Hospital Stay
Admission: EM | Admit: 2016-01-30 | Discharge: 2016-02-02 | DRG: 066 | Disposition: A | Discharge status: Home / Self Care | Payer: Medicare Other | Attending: Internal Medicine | Admitting: Internal Medicine

## 2016-01-30 DIAGNOSIS — E785 Hyperlipidemia, unspecified: Secondary | ICD-10-CM | POA: Diagnosis present

## 2016-01-30 DIAGNOSIS — R531 Weakness: Secondary | ICD-10-CM | POA: Diagnosis present

## 2016-01-30 DIAGNOSIS — Z7984 Long term (current) use of oral hypoglycemic drugs: Secondary | ICD-10-CM | POA: Diagnosis not present

## 2016-01-30 DIAGNOSIS — Z79899 Other long term (current) drug therapy: Secondary | ICD-10-CM | POA: Diagnosis not present

## 2016-01-30 DIAGNOSIS — N3281 Overactive bladder: Secondary | ICD-10-CM | POA: Diagnosis present

## 2016-01-30 DIAGNOSIS — R29810 Facial weakness: Secondary | ICD-10-CM | POA: Diagnosis present

## 2016-01-30 DIAGNOSIS — I10 Essential (primary) hypertension: Secondary | ICD-10-CM | POA: Diagnosis present

## 2016-01-30 DIAGNOSIS — Z823 Family history of stroke: Secondary | ICD-10-CM

## 2016-01-30 DIAGNOSIS — R6889 Other general symptoms and signs: Secondary | ICD-10-CM

## 2016-01-30 DIAGNOSIS — K219 Gastro-esophageal reflux disease without esophagitis: Secondary | ICD-10-CM | POA: Diagnosis present

## 2016-01-30 DIAGNOSIS — Z8042 Family history of malignant neoplasm of prostate: Secondary | ICD-10-CM

## 2016-01-30 DIAGNOSIS — Z885 Allergy status to narcotic agent status: Secondary | ICD-10-CM | POA: Diagnosis not present

## 2016-01-30 DIAGNOSIS — E119 Type 2 diabetes mellitus without complications: Secondary | ICD-10-CM | POA: Diagnosis present

## 2016-01-30 DIAGNOSIS — Z9861 Coronary angioplasty status: Secondary | ICD-10-CM

## 2016-01-30 DIAGNOSIS — R29701 NIHSS score 1: Secondary | ICD-10-CM | POA: Diagnosis present

## 2016-01-30 DIAGNOSIS — Z7982 Long term (current) use of aspirin: Secondary | ICD-10-CM

## 2016-01-30 DIAGNOSIS — I639 Cerebral infarction, unspecified: Secondary | ICD-10-CM | POA: Diagnosis present

## 2016-01-30 DIAGNOSIS — R4701 Aphasia: Secondary | ICD-10-CM | POA: Diagnosis present

## 2016-01-30 DIAGNOSIS — I129 Hypertensive chronic kidney disease with stage 1 through stage 4 chronic kidney disease, or unspecified chronic kidney disease: Secondary | ICD-10-CM | POA: Diagnosis present

## 2016-01-30 DIAGNOSIS — Z87891 Personal history of nicotine dependence: Secondary | ICD-10-CM

## 2016-01-30 DIAGNOSIS — R262 Difficulty in walking, not elsewhere classified: Secondary | ICD-10-CM

## 2016-01-30 DIAGNOSIS — N189 Chronic kidney disease, unspecified: Secondary | ICD-10-CM | POA: Diagnosis present

## 2016-01-30 LAB — DIFFERENTIAL
BASOS ABS: 0 10*3/uL (ref 0–0.1)
BASOS PCT: 1 %
EOS PCT: 2 %
Eosinophils Absolute: 0.1 10*3/uL (ref 0–0.7)
LYMPHS ABS: 1.3 10*3/uL (ref 1.0–3.6)
Lymphocytes Relative: 19 %
MONOS PCT: 13 %
Monocytes Absolute: 0.9 10*3/uL (ref 0.2–1.0)
Neutro Abs: 4.4 10*3/uL (ref 1.4–6.5)
Neutrophils Relative %: 65 %

## 2016-01-30 LAB — CBC
HEMATOCRIT: 42.6 % (ref 40.0–52.0)
HEMOGLOBIN: 14.6 g/dL (ref 13.0–18.0)
MCH: 31.7 pg (ref 26.0–34.0)
MCHC: 34.3 g/dL (ref 32.0–36.0)
MCV: 92.5 fL (ref 80.0–100.0)
Platelets: 167 10*3/uL (ref 150–440)
RBC: 4.6 MIL/uL (ref 4.40–5.90)
RDW: 14.1 % (ref 11.5–14.5)
WBC: 6.8 10*3/uL (ref 3.8–10.6)

## 2016-01-30 LAB — PROTIME-INR
INR: 0.9
Prothrombin Time: 12.1 seconds (ref 11.4–15.2)

## 2016-01-30 LAB — COMPREHENSIVE METABOLIC PANEL
ALT: 21 U/L (ref 17–63)
ANION GAP: 8 (ref 5–15)
AST: 25 U/L (ref 15–41)
Albumin: 4.2 g/dL (ref 3.5–5.0)
Alkaline Phosphatase: 50 U/L (ref 38–126)
BILIRUBIN TOTAL: 0.5 mg/dL (ref 0.3–1.2)
BUN: 20 mg/dL (ref 6–20)
CHLORIDE: 101 mmol/L (ref 101–111)
CO2: 30 mmol/L (ref 22–32)
Calcium: 9.7 mg/dL (ref 8.9–10.3)
Creatinine, Ser: 0.94 mg/dL (ref 0.61–1.24)
GFR calc non Af Amer: >60 mL/min (ref >60)
Glucose, Bld: 124 mg/dL — ABNORMAL HIGH (ref 65–99)
POTASSIUM: 4.5 mmol/L (ref 3.5–5.1)
Sodium: 139 mmol/L (ref 135–145)
TOTAL PROTEIN: 7.1 g/dL (ref 6.5–8.1)

## 2016-01-30 LAB — GLUCOSE, CAPILLARY
GLUCOSE-CAPILLARY: 112 mg/dL — AB (ref 65–99)
Glucose-Capillary: 102 mg/dL — ABNORMAL HIGH (ref 65–99)

## 2016-01-30 LAB — APTT: APTT: 30 s (ref 24–36)

## 2016-01-30 LAB — TROPONIN I

## 2016-01-30 MED ORDER — GLYBURIDE 5 MG PO TABS
5.0000 mg | ORAL_TABLET | Freq: Two times a day (BID) | ORAL | Status: DC
  Administered 2016-01-31 – 2016-02-02 (×4): 5 mg via ORAL
  Filled 2016-01-30 (×6): qty 1

## 2016-01-30 MED ORDER — ENOXAPARIN SODIUM 40 MG/0.4ML ~~LOC~~ SOLN
40.0000 mg | Freq: Two times a day (BID) | SUBCUTANEOUS | Status: DC
  Administered 2016-01-30 – 2016-02-02 (×6): 40 mg via SUBCUTANEOUS
  Filled 2016-01-30 (×6): qty 0.4

## 2016-01-30 MED ORDER — ASPIRIN EC 81 MG PO TBEC
81.0000 mg | DELAYED_RELEASE_TABLET | Freq: Every day | ORAL | Status: DC
  Administered 2016-01-31 – 2016-02-02 (×3): 81 mg via ORAL
  Filled 2016-01-30 (×3): qty 1

## 2016-01-30 MED ORDER — INSULIN ASPART 100 UNIT/ML ~~LOC~~ SOLN
0.0000 [IU] | Freq: Three times a day (TID) | SUBCUTANEOUS | Status: DC
  Administered 2016-01-31 – 2016-02-01 (×2): 2 [IU] via SUBCUTANEOUS
  Administered 2016-02-01: 17:00:00 3 [IU] via SUBCUTANEOUS
  Administered 2016-02-01 – 2016-02-02 (×2): 2 [IU] via SUBCUTANEOUS
  Filled 2016-01-30: qty 3
  Filled 2016-01-30 (×4): qty 2
  Filled 2016-01-30: qty 1

## 2016-01-30 MED ORDER — ASPIRIN 81 MG PO CHEW
324.0000 mg | CHEWABLE_TABLET | Freq: Once | ORAL | Status: AC
  Administered 2016-01-30: 324 mg via ORAL
  Filled 2016-01-30: qty 4

## 2016-01-30 MED ORDER — METFORMIN HCL 500 MG PO TABS
500.0000 mg | ORAL_TABLET | Freq: Two times a day (BID) | ORAL | Status: DC
  Administered 2016-01-31 – 2016-02-02 (×4): 500 mg via ORAL
  Filled 2016-01-30 (×4): qty 1

## 2016-01-30 MED ORDER — ACETAMINOPHEN 325 MG PO TABS
650.0000 mg | ORAL_TABLET | Freq: Four times a day (QID) | ORAL | Status: DC | PRN
  Administered 2016-01-30 – 2016-01-31 (×2): 650 mg via ORAL
  Filled 2016-01-30 (×2): qty 2

## 2016-01-30 MED ORDER — SIMVASTATIN 40 MG PO TABS
40.0000 mg | ORAL_TABLET | Freq: Every day | ORAL | Status: DC
  Administered 2016-01-31 – 2016-02-01 (×2): 40 mg via ORAL
  Filled 2016-01-30 (×3): qty 1

## 2016-01-30 MED ORDER — UMECLIDINIUM-VILANTEROL 62.5-25 MCG/INH IN AEPB
1.0000 | INHALATION_SPRAY | Freq: Every day | RESPIRATORY_TRACT | Status: DC
  Filled 2016-01-30: qty 14

## 2016-01-30 MED ORDER — FINASTERIDE 5 MG PO TABS
5.0000 mg | ORAL_TABLET | Freq: Every day | ORAL | Status: DC
  Administered 2016-01-31 – 2016-02-02 (×3): 5 mg via ORAL
  Filled 2016-01-30 (×4): qty 1

## 2016-01-30 MED ORDER — STROKE: EARLY STAGES OF RECOVERY BOOK
Freq: Once | Status: AC
  Administered 2016-01-30: 22:00:00

## 2016-01-30 MED ORDER — MIRABEGRON ER 50 MG PO TB24: 50.0000 mg | ORAL_TABLET | Freq: Every day | ORAL | Status: DC

## 2016-01-30 MED ORDER — UMECLIDINIUM-VILANTEROL 62.5-25 MCG/INH IN AEPB
1.0000 | INHALATION_SPRAY | Freq: Every day | RESPIRATORY_TRACT | Status: DC
  Administered 2016-01-30 – 2016-02-01 (×3): 1 via RESPIRATORY_TRACT
  Filled 2016-01-30: qty 14

## 2016-01-30 MED ORDER — PIOGLITAZONE HCL 45 MG PO TABS
45.0000 mg | ORAL_TABLET | Freq: Every day | ORAL | Status: DC
  Administered 2016-01-31 – 2016-02-02 (×3): 45 mg via ORAL
  Filled 2016-01-30 (×3): qty 1

## 2016-01-30 MED ORDER — GLYBURIDE-METFORMIN 5-500 MG PO TABS: 1.0000 | ORAL_TABLET | Freq: Two times a day (BID) | ORAL | Status: DC

## 2016-01-30 NOTE — ED Provider Notes (Signed)
[facility] Emergency Department Provider Note  Time seen: 4:21 PM  I have reviewed the triage vital signs and the nursing notes.   HISTORY  Chief Complaint Code Stroke    HPI Taylor Austin is a 75 y.o. male with a past medical history of CK D, gastric reflux, hypertension, hyperlipidemia, presents to the emergency department for difficulty speaking. According to the patient at 3 PM today they were sitting down talking when the patient acutely began having difficulty speaking. Patient cannot get his words out correctly. Wife states over the past 1.5 months he has had 2 or 3 similar episodes but has not sought medical attention until last week when he went to his PCP. Patient had a carotid ultrasound performed last week with unknown results. Patient started a baby aspirin last week as well. Currently the patient has a right facial droop and difficulty speaking during patient states at 3 PM when the symptoms started he felt weak in both of his arms but denies any focal deficit or unilateral deficit. Does state mild headache. Denies any chest pain or abdominal pain. Denies any vomiting or diarrhea.  Past Medical History:  Diagnosis Date  . Allergy   . Arthritis   . Back pain   . Barrett esophagus   . BPH (benign prostatic hyperplasia)   . Chronic kidney disease   . Coronary artery disease   . Depression   . Diabetes mellitus without complication ([place])   . Diverticulitis   . Dysphagia   . Esophageal reflux   . GERD (gastroesophageal reflux disease)   . Hyperlipidemia   . Hypersomnia   . Hypertension   . Kidney stones   . Low testosterone   . OAB (overactive bladder)   . Rheumatic fever   . Sleep apnea   . Vitamin B12 deficiency     Patient Active Problem List   Diagnosis Date Noted  . Chronic kidney disease 07/14/2014  . Clinical depression 07/14/2014  . Gait instability 05/05/2014  . Arthritis of knee 07/03/2013  . Arteriosclerosis of coronary  artery 07/03/2013  . Diabetes mellitus, type 2 ([place]) 07/03/2013  . Benign essential HTN 07/03/2013  . HLD (hyperlipidemia) 07/03/2013  . Breath shortness 07/03/2013  . Apnea, sleep 06/18/2013    Past Surgical History:  Procedure Laterality Date  . BACK SURGERY    . CARDIAC CATHETERIZATION  08/11/2014   Procedure: Left Heart Cath and Coronary Angiography;  Surgeon: Teodoro Spray, MD;  Location: [place] [facility];  Service: Cardiovascular;;  . CARDIAC CATHETERIZATION N/A 11/17/2014   Procedure: Right Heart Cath;  Surgeon: Teodoro Spray, MD;  Location: [place] [facility];  Service: Cardiovascular;  Laterality: N/A;  . CAROTID STENT    . CERVICAL SPINE SURGERY    . COLONOSCOPY    . COLONOSCOPY WITH PROPOFOL N/A 06/10/2015   Procedure: COLONOSCOPY WITH PROPOFOL;  Surgeon: Manya Silvas, MD;  Location: [facility] ENDOSCOPY;  Service: Endoscopy;  Laterality: N/A;  . CORONARY ANGIOPLASTY    . ESOPHAGOGASTRODUODENOSCOPY ENDOSCOPY    . LUMBAR SPINE SURGERY    . THORACIC SPINE SURGERY    . TONSILLECTOMY      Prior to Admission medications   Medication Sig Start Date End Date Taking? Authorizing Provider  Cyanocobalamin (VITAMIN B-12 PO) Take by mouth.    Historical Provider, MD  finasteride (PROSCAR) 5 MG tablet Take 1 tablet (5 mg total) by mouth daily. 11/21/15   Nori Riis, PA-C  fluticasone-salmeterol (ADVAIR HFA) [id number]  MCG/ACT inhaler Inhale 2 puffs into the lungs 2 (two) times daily. Reported on 03/01/2015    Historical Provider, MD  glyBURIDE-metformin (GLUCOVANCE) 5-500 MG per tablet TAKE ONE TABLET BY MOUTH TWICE DAILY WITH MEALS 04/01/14   Historical Provider, MD  losartan-hydrochlorothiazide (HYZAAR) 100-25 MG per tablet Take 1 tablet by mouth daily.  06/24/14   Historical Provider, MD  magnesium oxide (MAG-OX) 400 MG tablet Take 400 mg by mouth daily.    Historical Provider, MD  metoprolol succinate (TOPROL-XL) 25 MG 24 hr tablet TAKE ONE TABLET BY MOUTH ONCE DAILY 04/23/14    Historical Provider, MD  mirabegron ER (MYRBETRIQ) 50 MG TB24 tablet Take 1 tablet (50 mg total) by mouth daily. 03/01/15   Hollice Espy, MD  Multiple Vitamin (MULTI-VITAMINS) TABS Take 1 tablet by mouth daily.     Historical Provider, MD  omeprazole (PRILOSEC) 40 MG capsule Take 40 mg by mouth daily.    Historical Provider, MD  oxymetazoline (AFRIN) 0.05 % nasal spray Place 1 spray into both nostrils 2 (two) times daily.    Historical Provider, MD  pioglitazone (ACTOS) 45 MG tablet Take 22.5 mg by mouth 2 (two) times daily.  04/16/14   Historical Provider, MD  Probiotic Product (PROBIOTIC PO) Take by mouth.    Historical Provider, MD  simvastatin (ZOCOR) 40 MG tablet Take 40 mg by mouth daily at 6 PM.  07/02/14   Historical Provider, MD  umeclidinium-vilanterol (ANORO ELLIPTA) 62.5-25 MCG/INH AEPB Inhale 1 puff into the lungs daily.    Historical Provider, MD    Allergies  Allergen Reactions  . Hydrocodone Shortness Of Breath  . Oxycodone-Acetaminophen Shortness Of Breath    Family History  Problem Relation Age of Onset  . Stroke Father   . Anesthesia problems Father   . Anesthesia problems Mother   . Prostate cancer Brother   . Kidney disease Neg Hx   . Bladder Cancer Neg Hx     Social History Social History  Substance Use Topics  . Smoking status: Former Smoker    Types: Cigarettes    Quit date: 07/13/1973  . Smokeless tobacco: Never Used     Comment: reports he smoked 4 packs a day for 10 years and quit in 1975  . Alcohol use 0.6 oz/week    1 Standard drinks or equivalent per week     Comment: rarely drinks, for holidays    Review of Systems Constitutional: Negative for fever Cardiovascular: Negative for chest pain. Respiratory: Negative for shortness of breath. Gastrointestinal: Negative for abdominal pain, vomiting Neurological: Mild headache. Heaviness in bilateral arms. Denies any numbness. 10-point ROS otherwise  negative.  ____________________________________________   PHYSICAL EXAM:  Constitutional: Alert and oriented. Well appearing and in no distress. Some difficulty speaking, mild deficit at this time. Eyes: Normal exam ENT   Head: Normocephalic and atraumatic   Mouth/Throat: Mucous membranes are moist. Cardiovascular: Normal rate, regular rhythm. No murmur Respiratory: Normal respiratory effort without tachypnea nor retractions. Breath sounds are clear Gastrointestinal: Soft and nontender. No distention.  T Musculoskeletal: Nontender with normal range of motion in all extremities.  Neurologic:  Patient has a moderate right-sided facial droop. Mild aphasia. Equal grip strengths. No pronator drift. No lower extremity drift. No sensory deficits identified. Skin:  Skin is warm, dry and intact.  Psychiatric: Mood and affect are normal.   ____________________________________________    EKG  EKG reviewed and interpreted by myself shows sinus rhythm at 93 bpm. Narrow QRS, left axis deviation, largely normal intervals  besides a slightly prolonged PR segment consistent with first-degree AV block, nonspecific but no concerning ST changes.  ____________________________________________    RADIOLOGY  CT head shows no acute abnormality, lacunar infarct which appears to be chronic but new since 2015.  ____________________________________________   INITIAL IMPRESSION / ASSESSMENT AND PLAN / ED COURSE  Pertinent labs & imaging results that were available during my care of the patient were reviewed by me and considered in my medical decision making (see chart for details).  Patient presents to the emergency department with acute onset of difficulty speaking and right facial droop at 3 PM today. Wife states prior to this he was speaking with no difficulty. Patient has had 2 or 3 similar events over the past 6 weeks but has not sought medical attention until last week when he saw his primary  care physician. Code stroke protocols initiated. We will have stat neurological consultation to discuss tPA. Patient denies any history of blood thinners besides a baby aspirin. No history of intracranial or GI bleed in the past.  NIH Stroke Scale   Interval: Baseline Time: 5:07 PM Person Administering Scale: Jak Haggar  Administer stroke scale items in the order listed. Record performance in each category after each subscale exam. Do not go back and change scores. Follow directions provided for each exam technique. Scores should reflect what the patient does, not what the clinician thinks the patient can do. The clinician should record answers while administering the exam and work quickly. Except where indicated, the patient should not be coached (i.e., repeated requests to patient to make a special effort).   1a  Level of consciousness: 0=alert; keenly responsive  1b. LOC questions:  0=Performs both tasks correctly  1c. LOC commands: 0=Performs both tasks correctly  2.  Best Gaze: 0=normal  3.  Visual: 0=No visual loss  4. Facial Palsy: 2=Partial paralysis (total or near total paralysis of the lower face)  5a.  Motor left arm: 0=No drift, limb holds 90 (or 45) degrees for full 10 seconds  5b.  Motor right arm: 0=No drift, limb holds 90 (or 45) degrees for full 10 seconds  6a. motor left leg: 0=No drift, limb holds 90 (or 45) degrees for full 10 seconds  6b  Motor right leg:  0=No drift, limb holds 90 (or 45) degrees for full 10 seconds  7. Limb Ataxia: 0=Absent  8.  Sensory: 0=Normal; no sensory loss  9. Best Language:  1=Mild to moderate aphasia; some obvious loss of fluency or facility of comprehension without significant limitation on ideas expressed or form of expression.  10. Dysarthria: 0=Normal  11. Extinction and Inattention: 0=No abnormality  12. Distal motor function: 0=Normal   Total:   3   Neurology has seen the patient. They agree with possible CVA but due to  resolving symptoms, patient no longer has a aphasia, and as his symptoms have been recurrent over the past one month they do not recommend TPA at this time. We will give a full dose aspirin and admitted to the hospital for further workup and MRI. Patient agreeable to plan. ____________________________________________   FINAL CLINICAL IMPRESSION(S) / ED DIAGNOSES  CVA    Harvest Dark, MD 01/30/16 [phone]

## 2016-01-30 NOTE — ED Notes (Signed)
Report given to Jackie RN

## 2016-01-30 NOTE — Progress Notes (Signed)
Pt has been ordered lovenox 40mg  q 24hr. Pt w/ a BMI >40 and a crcl >30, therefore will change to lovenox 40 BID  Jaeline Whobrey D Ellena Kamen, Pharm.D, BCPS Clinical Pharmacist

## 2016-01-30 NOTE — ED Notes (Signed)
SOC in progress.  

## 2016-01-30 NOTE — ED Notes (Signed)
When [facility] neurologist asked when last normal, pt and pts wife estimated 1400.

## 2016-01-30 NOTE — Progress Notes (Signed)
[facility] was paged for a stroke Pt in ED Rm26. [place] met Pt with his wife bedside. Pt was alert and talked to [facility]. [facility] told Pt and wife that [facility] is available if needed.     01/30/16 1635  Clinical Encounter Type  Visited With Patient;Patient and family together  Visit Type Initial;Spiritual support;Code;ED  Referral From Nurse  Consult/Referral To Chaplain  Spiritual Encounters  Spiritual Needs Prayer;Other (Comment)  Advance Directives (For Healthcare)  Does Patient Have a Medical Advance Directive? No  Would patient like information on creating a medical advance directive? No - Patient declined  [place]  Does Patient Have a Mental Health Advance Directive? No  Would patient like information on creating a mental health advance directive? No - Patient declined

## 2016-01-30 NOTE — ED Notes (Signed)
SOC complete.  

## 2016-01-30 NOTE — ED Notes (Signed)
Pts wife requesting to give pts home PO diabetes medication.  MD Paduchowski made aware, stated ot was alright.  Pts CBG checked and pt recently ate.  Informed pts wife

## 2016-01-30 NOTE — H&P (Signed)
[place] [facility] [place] NAME: Taylor Austin    MR#:  [id number]  DATE OF BIRTH:  February 23, 1940  DATE OF ADMISSION:  01/30/2016  PRIMARY CARE PHYSICIAN: Taylor Pitch, MD   REQUESTING/REFERRING PHYSICIAN: Dr. Harvest Austin  CHIEF COMPLAINT:Difficulty speaking    Chief Complaint  Patient presents with  . Code Stroke    HISTORY OF PRESENT ILLNESS:  Taylor Austin  is a 76 y.o. male with a known history of hypertension, diabetes mellitus type 2, hyperlipidemia, overactive bladder comes in because of slurred speech, difficulty getting the words out since yesterday. Patient says that he had the at least 3 episodes of slurred speech the in this month. Went to primary doctor Dr. Lovie Austin, patient had ultrasound of carotids done last week but the results are not available. No weakness of hands or legs, no numbness or tingling in extremities or face. His main complaint is only slurred speech and expressive aphasia. Speech difficulties started around 3 PM today, by the time he came to emergency room he did have a speech difficulty but symptoms resolved with time. Lasted about 15-20 minutes only.  PAST MEDICAL HISTORY:   Past Medical History:  Diagnosis Date  . Allergy   . Arthritis   . Back pain   . Barrett esophagus   . BPH (benign prostatic hyperplasia)   . Chronic kidney disease   . Coronary artery disease   . Depression   . Diabetes mellitus without complication ([place])   . Diverticulitis   . Dysphagia   . Esophageal reflux   . GERD (gastroesophageal reflux disease)   . Hyperlipidemia   . Hypersomnia   . Hypertension   . Kidney stones   . Low testosterone   . OAB (overactive bladder)   . Rheumatic fever   . Sleep apnea   . Vitamin B12 deficiency     PAST SURGICAL HISTOIRY:   Past Surgical History:  Procedure Laterality Date  . BACK SURGERY    . CARDIAC CATHETERIZATION  08/11/2014   Procedure: Left Heart Cath and  Coronary Angiography;  Surgeon: Teodoro Spray, MD;  Location: [facility];  Service: Cardiovascular;;  . CARDIAC CATHETERIZATION N/A 11/17/2014   Procedure: Right Heart Cath;  Surgeon: Teodoro Spray, MD;  Location: [facility] [facility];  Service: Cardiovascular;  Laterality: N/A;  . CAROTID STENT    . CERVICAL SPINE SURGERY    . COLONOSCOPY    . COLONOSCOPY WITH PROPOFOL N/A 06/10/2015   Procedure: COLONOSCOPY WITH PROPOFOL;  Surgeon: Manya Silvas, MD;  Location: [facility] ENDOSCOPY;  Service: Endoscopy;  Laterality: N/A;  . CORONARY ANGIOPLASTY    . ESOPHAGOGASTRODUODENOSCOPY ENDOSCOPY    . LUMBAR SPINE SURGERY    . THORACIC SPINE SURGERY    . TONSILLECTOMY      SOCIAL HISTORY:   Social History  Substance Use Topics  . Smoking status: Former Smoker    Types: Cigarettes    Quit date: 07/13/1973  . Smokeless tobacco: Never Used     Comment: reports he smoked 4 packs a day for 10 years and quit in 1975  . Alcohol use 0.6 oz/week    1 Standard drinks or equivalent per week     Comment: rarely drinks, for holidays    FAMILY HISTORY:   Family History  Problem Relation Age of Onset  . Stroke Father   . Anesthesia problems Father   . Anesthesia problems Mother   . Prostate cancer  Brother   . Kidney disease Neg Hx   . Bladder Cancer Neg Hx     DRUG ALLERGIES:   Allergies  Allergen Reactions  . Hydrocodone Shortness Of Breath  . Oxycodone-Acetaminophen Shortness Of Breath    REVIEW OF SYSTEMS:  CONSTITUTIONAL: No fever, fatigue or weakness.  EYES: No blurred or double vision.  EARS, NOSE, AND THROAT: No tinnitus or ear pain.  RESPIRATORY: No cough, shortness of breath, wheezing or hemoptysis.  CARDIOVASCULAR: No chest pain, orthopnea, edema.  GASTROINTESTINAL: No nausea, vomiting, diarrhea or abdominal pain.  GENITOURINARY: No dysuria, hematuria.  ENDOCRINE: No polyuria, nocturia,  HEMATOLOGY: No anemia, easy bruising or bleeding SKIN: No rash or  lesion. MUSCULOSKELETAL: No joint pain or arthritis.   NEUROLOGIC: No tingling, numbness, weakness. Expressive aphasia. PSYCHIATRY: No anxiety or depression.   MEDICATIONS AT HOME:   Prior to Admission medications   Medication Sig Start Date End Date Taking? Authorizing Provider  aspirin EC 81 MG tablet Take 81 mg by mouth daily.   Yes Historical Provider, MD  Cyanocobalamin (VITAMIN B-12 PO) Take by mouth.   Yes Historical Provider, MD  finasteride (PROSCAR) 5 MG tablet Take 1 tablet (5 mg total) by mouth daily. 11/21/15  Yes Shannon A McGowan, PA-C  glyBURIDE-metformin (GLUCOVANCE) 5-500 MG per tablet TAKE ONE TABLET BY MOUTH TWICE DAILY WITH MEALS 04/01/14  Yes Historical Provider, MD  magnesium oxide (MAG-OX) 400 MG tablet Take 400 mg by mouth daily.   Yes Historical Provider, MD  Multiple Vitamin (MULTI-VITAMINS) TABS Take 1 tablet by mouth daily.    Yes Historical Provider, MD  oxymetazoline (AFRIN) 0.05 % nasal spray Place 1 spray into both nostrils 2 (two) times daily.   Yes Historical Provider, MD  pioglitazone (ACTOS) 45 MG tablet Take 45 mg by mouth daily.  04/16/14  Yes Historical Provider, MD  Probiotic Product (PROBIOTIC PO) Take by mouth.   Yes Historical Provider, MD  simvastatin (ZOCOR) 40 MG tablet Take 40 mg by mouth daily at 6 PM.  07/02/14  Yes Historical Provider, MD  umeclidinium-vilanterol (ANORO ELLIPTA) 62.5-25 MCG/INH AEPB Inhale 1 puff into the lungs daily.   Yes Historical Provider, MD  mirabegron ER (MYRBETRIQ) 50 MG TB24 tablet Take 1 tablet (50 mg total) by mouth daily. Patient not taking: Reported on 01/30/2016 03/01/15   Hollice Espy, MD      VITAL SIGNS:  Blood pressure (!) 135/109, pulse 86, resp. rate 18, height 5\' 6"  (1.676 m), weight 122.5 kg (270 lb), SpO2 98 %.  PHYSICAL EXAMINATION:  GENERAL:  76 y.o.-year-old patient lying in the bed with no acute distress.  EYES: Pupils equal, round, reactive to light and accommodation. No scleral icterus.  Extraocular muscles intact.  HEENT: Head atraumatic, normocephalic. Oropharynx and nasopharynx clear.  NECK:  Supple, no jugular venous distention. No thyroid enlargement, no tenderness.  LUNGS: Normal breath sounds bilaterally, no wheezing, rales,rhonchi or crepitation. No use of accessory muscles of respiration.  CARDIOVASCULAR: S1, S2 normal. No murmurs, rubs, or gallops.  ABDOMEN: Soft, nontender, nondistended. Bowel sounds present. No organomegaly or mass.  EXTREMITIES: No pedal edema, cyanosis, or clubbing.  NEUROLOGIC: Cranial nerves II through XII are intact. Muscle strength 5/5 in all extremities. Sensation intact. Gait not checked. Note  To have sllured speech and expressive aphasia.but no facial droop PSYCHIATRIC: The patient is alert and oriented x 3.  SKIN: No obvious rash, lesion, or ulcer.   LABORATORY PANEL:   CBC  Recent Labs Lab 01/30/16 1608  WBC 6.8  HGB 14.6  HCT 42.6  PLT 167   ------------------------------------------------------------------------------------------------------------------  Chemistries   Recent Labs Lab 01/30/16 1608  NA 139  K 4.5  CL 101  CO2 30  GLUCOSE 124*  BUN 20  CREATININE 0.94  CALCIUM 9.7  AST 25  ALT 21  ALKPHOS 50  BILITOT 0.5   ------------------------------------------------------------------------------------------------------------------  Cardiac Enzymes  Recent Labs Lab 01/30/16 1608  TROPONINI <0.03   ------------------------------------------------------------------------------------------------------------------  RADIOLOGY:  Ct Head Code Stroke W/o Cm  Result Date: 01/30/2016 CLINICAL DATA:  Code stroke. New onset slurred speech beginning 1.5 hours ago. Difficulty speaking. Headache. EXAM: CT HEAD WITHOUT CONTRAST TECHNIQUE: Contiguous axial images were obtained from the base of the skull through the vertex without intravenous contrast. COMPARISON:  MRI brain 11/19/2013.  CTA of the neck 01/04/2014.  FINDINGS: Brain: No acute cortical infarct is present. Scattered white matter changes are again seen. A lacunar infarct in the right internal capsule was not present on the prior study, but appears remote. The brainstem and cerebellum are within normal limits. The ventricles are proportionate to the degree of atrophy. No significant extra-axial fluid collection is present. Vascular: No hyperdense vessel or unexpected calcification. Skull: Normal. Negative for fracture or focal lesion. Sinuses/Orbits: The paranasal sinuses and the mastoid air cells are clear. ASPECTS [facility] Stroke Program Early CT Score) - Ganglionic level infarction (caudate, lentiform nuclei, internal capsule, insula, M1-M3 cortex): 7/7 - Supraganglionic infarction (M4-M6 cortex): 3/3 Total score (0-10 with 10 being normal): 10/10 IMPRESSION: 1. No acute intracranial abnormality. 2. Mild atrophy and white matter disease is again seen. This likely reflects the sequela of chronic microvascular ischemia. 3. Lacunar infarct within the right internal capsule was not present on the prior study, but appears remote. 4. ASPECTS is 10/10 These results were called by telephone at the time of interpretation on 01/30/2016 at 4:17 pm to Dr. Harvest Austin , who verbally acknowledged these results. Electronically Signed   By: San Morelle M.D.   On: 01/30/2016 16:18    EKG:   Orders placed or performed during the hospital encounter of 01/30/16  . EKG 12-Lead  . EKG 12-Lead   Sinus rhythm with 93 bpm no ST T changes.  IMPRESSION AND PLAN:   #51 .76 year old male patient with expressive aphagia lasting for 15-20 minutes with prior episode similarly in the last 3 weeks: Concerning for possible acute stroke CT head showed  Acute lacunar  the right side, admit to stroke unit, check MRI of the brain and MRA of the brain, echocardiogram, did have ultrasound ofcarotids  with primary doctor recently so please get the report. Get physical therapy,  speech therapy, neurology consult recurrent symptoms concerning for possible focal seizure, not a candidate for TPA because of symptoms markedly improved without residual deficits. Specialist  on-call was consulted in the emergency room. monitor on telemetry for any arrhythmias.    #2 diabetes mellitus type 2: Continue home medication check hemoglobin A1c. #3 hypertension, ;allow permissive ypertension next 24 hours, hold off on antihypertensives until cystoscopy but there is more than [id number] and diastolic more than [id number]. And that he can use IV labetalol at that time.   All the records are reviewed and case discussed with ED provider. Management plans discussed with the patient, family and they are in agreement.  CODE STATUS: full  TOTAL TIME TAKING CARE OF THIS PATIENT: 56minutes.    Epifanio Lesches M.D on 01/30/2016 at 6:23 PM  Between 7am to 6pm - Pager - [phone]  After 6pm go  to [email or web address] - password [email or web address] [facility]  Office  [phone]  CC: Primary care physician; Taylor Pitch, MD  Note: This dictation was prepared with Dragon dictation along with smaller phrase technology. Any transcriptional errors that result from this process are unintentional.

## 2016-01-30 NOTE — ED Triage Notes (Signed)
Pt wife reports new onset slurred speech one and one-half hours ago. Pt with noted difficulty speaking on arrival. Pt reports headache.

## 2016-01-30 NOTE — ED Notes (Signed)
The EKG was completed and signed by Dr. Paduchowski. The EKG was also exported into the system. 

## 2016-01-31 ENCOUNTER — Inpatient Hospital Stay: Payer: Medicare Other

## 2016-01-31 ENCOUNTER — Encounter: Payer: Self-pay | Admitting: Internal Medicine

## 2016-01-31 ENCOUNTER — Inpatient Hospital Stay
Admit: 2016-01-31 | Discharge: 2016-01-31 | Disposition: A | Discharge status: Home / Self Care | Payer: Medicare Other | Attending: Internal Medicine | Admitting: Internal Medicine

## 2016-01-31 DIAGNOSIS — I639 Cerebral infarction, unspecified: Principal | ICD-10-CM

## 2016-01-31 LAB — LIPID PANEL
CHOL/HDL RATIO: 4.6 ratio
Cholesterol: 170 mg/dL (ref 0–200)
HDL: 37 mg/dL — AB (ref >40)
LDL CALC: 59 mg/dL (ref 0–99)
TRIGLYCERIDES: 372 mg/dL — AB (ref <150)
VLDL: 74 mg/dL — AB (ref 0–40)

## 2016-01-31 LAB — GLUCOSE, CAPILLARY
GLUCOSE-CAPILLARY: 144 mg/dL — AB (ref 65–99)
GLUCOSE-CAPILLARY: 97 mg/dL (ref 65–99)
Glucose-Capillary: 150 mg/dL — ABNORMAL HIGH (ref 65–99)
Glucose-Capillary: 118 mg/dL — ABNORMAL HIGH (ref 65–99)

## 2016-01-31 LAB — ECHOCARDIOGRAM COMPLETE
Height: 66 in
Weight: 4320 oz

## 2016-01-31 MED ORDER — KETOROLAC TROMETHAMINE 30 MG/ML IJ SOLN
30.0000 mg | Freq: Once | INTRAMUSCULAR | Status: AC
  Administered 2016-01-31: 02:00:00 30 mg via INTRAVENOUS
  Filled 2016-01-31: qty 1

## 2016-01-31 MED ORDER — CLOPIDOGREL BISULFATE 75 MG PO TABS
75.0000 mg | ORAL_TABLET | Freq: Every day | ORAL | Status: DC
  Administered 2016-01-31 – 2016-02-02 (×3): 75 mg via ORAL
  Filled 2016-01-31 (×3): qty 1

## 2016-01-31 MED ORDER — CLOPIDOGREL BISULFATE 75 MG PO TABS: 75.0000 mg | ORAL_TABLET | Freq: Every day | ORAL | 0 refills | Status: DC

## 2016-01-31 MED ORDER — ASPIRIN-ACETAMINOPHEN-CAFFEINE 250-250-65 MG PO TABS
2.0000 | ORAL_TABLET | Freq: Three times a day (TID) | ORAL | Status: DC | PRN
  Administered 2016-01-31 – 2016-02-01 (×3): 2 via ORAL
  Filled 2016-01-31 (×6): qty 2

## 2016-01-31 NOTE — Progress Notes (Signed)
[facility] NAME: Taylor Austin    MR#:  [id number]  DATE OF BIRTH:  [birth year]  SUBJECTIVE:  CHIEF COMPLAINT:   Chief Complaint  Patient presents with  . Code Stroke   Complains of some word finding difficulty REVIEW OF SYSTEMS:    Review of Systems  Constitutional: Negative for chills and fever.  HENT: Negative for sore throat.   Eyes: Negative for blurred vision, double vision and pain.  Respiratory: Negative for cough, hemoptysis, shortness of breath and wheezing.   Cardiovascular: Negative for chest pain, palpitations, orthopnea and leg swelling.  Gastrointestinal: Negative for abdominal pain, constipation, diarrhea, heartburn, nausea and vomiting.  Genitourinary: Negative for dysuria and hematuria.  Musculoskeletal: Negative for back pain and joint pain.  Skin: Negative for rash.  Neurological: Positive for speech change. Negative for sensory change, focal weakness and headaches.  Endo/Heme/Allergies: Does not bruise/bleed easily.  Psychiatric/Behavioral: Negative for depression. The patient is not nervous/anxious.     DRUG ALLERGIES:   Allergies  Allergen Reactions  . Hydrocodone Shortness Of Breath  . Oxycodone-Acetaminophen Shortness Of Breath    VITALS:  Blood pressure (!) 155/81, pulse (!) 102, temperature 98.1 F (36.7 C), temperature source Oral, resp. rate 16, height 5\' 6"  (1.676 m), weight 122.5 kg (270 lb), SpO2 94 %.  PHYSICAL EXAMINATION:   Physical Exam  GENERAL:  76 y.o.-year-old patient lying in the bed with no acute distress.  EYES: Pupils equal, round, reactive to light and accommodation. No scleral icterus. Extraocular muscles intact.  HEENT: Head atraumatic, normocephalic. Oropharynx and nasopharynx clear.  NECK:  Supple, no jugular venous distention. No thyroid enlargement, no tenderness.  LUNGS: Normal breath sounds bilaterally, no wheezing, rales, rhonchi. No use of accessory muscles of  respiration.  CARDIOVASCULAR: S1, S2 normal. No murmurs, rubs, or gallops.  ABDOMEN: Soft, nontender, nondistended. Bowel sounds present. No organomegaly or mass.  EXTREMITIES: No cyanosis, clubbing or edema b/l.    NEUROLOGIC: Cranial nerves II through XII are intact. No focal Motor or sensory deficits b/l.   PSYCHIATRIC: The patient is alert and oriented x 3.  SKIN: No obvious rash, lesion, or ulcer.   LABORATORY PANEL:   CBC  Recent Labs Lab 01/30/16 1608  WBC 6.8  HGB 14.6  HCT 42.6  PLT 167   ------------------------------------------------------------------------------------------------------------------ Chemistries   Recent Labs Lab 01/30/16 1608  NA 139  K 4.5  CL 101  CO2 30  GLUCOSE 124*  BUN 20  CREATININE 0.94  CALCIUM 9.7  AST 25  ALT 21  ALKPHOS 50  BILITOT 0.5   ------------------------------------------------------------------------------------------------------------------  Cardiac Enzymes  Recent Labs Lab 01/30/16 1608  TROPONINI <0.03   ------------------------------------------------------------------------------------------------------------------  RADIOLOGY:  Mr Brain Wo Contrast  Result Date: 01/31/2016 CLINICAL DATA:  76 year old diabetic hypertensive male with 3 episodes of slurred speech this past month. Subsequent encounter. EXAM: MRI HEAD WITHOUT CONTRAST MRA HEAD WITHOUT CONTRAST TECHNIQUE: Multiplanar, multiecho pulse sequences of the brain and surrounding structures were obtained without intravenous contrast. Angiographic images of the head were obtained using MRA technique without contrast. COMPARISON:  01/30/2016 head CT.  11/19/2013 brain MR. FINDINGS: MRI HEAD FINDINGS Brain: Left parietal subcortical white matter hyperintensity appears larger than on the prior exam and may represent combination of result of chronic microvascular changes and small subacute infarct contributing to T2 shine through on diffusion sequence. No acute  infarct is seen separate from this region. Remote small infarct right globus pallidus/ med corona radiata. Remote small  infarct anterior and posterior aspect of the left corona radiata. Chronic microvascular changes. No intracranial hemorrhage. No intracranial mass lesion noted on this unenhanced exam. Mild global atrophy without hydrocephalus. Focal calcification right aspect of the anterior falx unchanged. Vascular: Major intracranial vascular structures are patent. Please see below. Skull and upper cervical spine: Mild degenerative changes upper cervical spine. Cervicomedullary junction within normal limits. Sinuses/Orbits: Exophthalmos.  Normal symmetric extraocular muscles. Polypoid opacification superior aspect left maxillary sinus. Other: Negative. MRA HEAD FINDINGS Anterior circulation without medium or large size vessel significant stenosis or occlusion. Fetal contribution to the right posterior cerebral artery. Mild branch vessel irregularity middle cerebral artery bilaterally and A2 segment left anterior cerebral artery. Mild irregularity without significant stenosis of the distal vertebral arteries or basilar artery. Small caliber right posterior inferior cerebellar artery. Nonvisualized anterior inferior cerebellar artery bilaterally. Only the proximal aspect of the right superior cerebellar artery is visualized. Mild narrowing and irregularity of the distal branches of the posterior cerebral artery bilaterally. No aneurysm or vascular malformation noted. IMPRESSION: MRI HEAD Left parietal subcortical white matter hyperintensity appears larger than on the prior exam and may represent combination of result of chronic microvascular changes and small subacute infarct contributing to T2 shine through on diffusion sequence. No acute infarct is seen separate from this region. Remote small infarct right globus pallidus/ med corona radiata. Remote small infarct anterior and posterior aspect of the left corona  radiata. Chronic microvascular changes. MRA HEAD No large vessel occlusion. Mild branch vessel atherosclerotic changes as noted above. Electronically Signed   By: Genia Del M.D.   On: 01/31/2016 09:45   US Carotid Bilateral  Result Date: 01/31/2016 CLINICAL DATA:  CVA. EXAM: BILATERAL CAROTID DUPLEX ULTRASOUND TECHNIQUE: Pearline Cables scale imaging, color Doppler and duplex ultrasound were performed of bilateral carotid and vertebral arteries in the neck. COMPARISON:  01/31/2016. FINDINGS: Criteria: Quantification of carotid stenosis is based on velocity parameters that correlate the residual internal carotid diameter with NASCET-based stenosis levels, using the diameter of the distal internal carotid lumen as the denominator for stenosis measurement. The following velocity measurements were obtained: RIGHT ICA:  72/23 cm/sec CCA:  0000000 cm/sec SYSTOLIC ICA/CCA RATIO:  0.9 DIASTOLIC ICA/CCA RATIO:  1.2 ECA:  123 cm/sec LEFT ICA:  66/14 cm/sec CCA:  99991111 cm/sec SYSTOLIC ICA/CCA RATIO:  0.9 DIASTOLIC ICA/CCA RATIO:  0.8 ECA:  90 cm/sec RIGHT CAROTID ARTERY: Mild atherosclerotic plaque right carotid bifurcation. No flow limiting stenosis. RIGHT VERTEBRAL ARTERY:  Patent with antegrade flow. LEFT CAROTID ARTERY: Mild atherosclerotic plaque left carotid bifurcation. LEFT VERTEBRAL ARTERY:  Patent with antegrade flow. IMPRESSION: 1. Mild bilateral carotid bifurcation atherosclerotic vascular plaque. No flow limiting stenosis. Degree of stenosis less than 50% bilaterally. 2.  Vertebrals are patent with antegrade flow. Electronically Signed   By: Marcello Moores  Register   On: 01/31/2016 10:46   Mr Jodene Nam Head/brain Wo Cm  Result Date: 01/31/2016 CLINICAL DATA:  76 year old diabetic hypertensive male with 3 episodes of slurred speech this past month. Subsequent encounter. EXAM: MRI HEAD WITHOUT CONTRAST MRA HEAD WITHOUT CONTRAST TECHNIQUE: Multiplanar, multiecho pulse sequences of the brain and surrounding structures were obtained  without intravenous contrast. Angiographic images of the head were obtained using MRA technique without contrast. COMPARISON:  01/30/2016 head CT.  11/19/2013 brain MR. FINDINGS: MRI HEAD FINDINGS Brain: Left parietal subcortical white matter hyperintensity appears larger than on the prior exam and may represent combination of result of chronic microvascular changes and small subacute infarct contributing to T2 shine through on  diffusion sequence. No acute infarct is seen separate from this region. Remote small infarct right globus pallidus/ med corona radiata. Remote small infarct anterior and posterior aspect of the left corona radiata. Chronic microvascular changes. No intracranial hemorrhage. No intracranial mass lesion noted on this unenhanced exam. Mild global atrophy without hydrocephalus. Focal calcification right aspect of the anterior falx unchanged. Vascular: Major intracranial vascular structures are patent. Please see below. Skull and upper cervical spine: Mild degenerative changes upper cervical spine. Cervicomedullary junction within normal limits. Sinuses/Orbits: Exophthalmos.  Normal symmetric extraocular muscles. Polypoid opacification superior aspect left maxillary sinus. Other: Negative. MRA HEAD FINDINGS Anterior circulation without medium or large size vessel significant stenosis or occlusion. Fetal contribution to the right posterior cerebral artery. Mild branch vessel irregularity middle cerebral artery bilaterally and A2 segment left anterior cerebral artery. Mild irregularity without significant stenosis of the distal vertebral arteries or basilar artery. Small caliber right posterior inferior cerebellar artery. Nonvisualized anterior inferior cerebellar artery bilaterally. Only the proximal aspect of the right superior cerebellar artery is visualized. Mild narrowing and irregularity of the distal branches of the posterior cerebral artery bilaterally. No aneurysm or vascular malformation  noted. IMPRESSION: MRI HEAD Left parietal subcortical white matter hyperintensity appears larger than on the prior exam and may represent combination of result of chronic microvascular changes and small subacute infarct contributing to T2 shine through on diffusion sequence. No acute infarct is seen separate from this region. Remote small infarct right globus pallidus/ med corona radiata. Remote small infarct anterior and posterior aspect of the left corona radiata. Chronic microvascular changes. MRA HEAD No large vessel occlusion. Mild branch vessel atherosclerotic changes as noted above. Electronically Signed   By: Genia Del M.D.   On: 01/31/2016 09:45   Ct Head Code Stroke W/o Cm  Result Date: 01/30/2016 CLINICAL DATA:  Code stroke. New onset slurred speech beginning 1.5 hours ago. Difficulty speaking. Headache. EXAM: CT HEAD WITHOUT CONTRAST TECHNIQUE: Contiguous axial images were obtained from the base of the skull through the vertex without intravenous contrast. COMPARISON:  MRI brain 11/19/2013.  CTA of the neck 01/04/2014. FINDINGS: Brain: No acute cortical infarct is present. Scattered white matter changes are again seen. A lacunar infarct in the right internal capsule was not present on the prior study, but appears remote. The brainstem and cerebellum are within normal limits. The ventricles are proportionate to the degree of atrophy. No significant extra-axial fluid collection is present. Vascular: No hyperdense vessel or unexpected calcification. Skull: Normal. Negative for fracture or focal lesion. Sinuses/Orbits: The paranasal sinuses and the mastoid air cells are clear. ASPECTS [facility] Stroke Program Early CT Score) - Ganglionic level infarction (caudate, lentiform nuclei, internal capsule, insula, M1-M3 cortex): 7/7 - Supraganglionic infarction (M4-M6 cortex): 3/3 Total score (0-10 with 10 being normal): 10/10 IMPRESSION: 1. No acute intracranial abnormality. 2. Mild atrophy and white matter  disease is again seen. This likely reflects the sequela of chronic microvascular ischemia. 3. Lacunar infarct within the right internal capsule was not present on the prior study, but appears remote. 4. ASPECTS is 10/10 These results were called by telephone at the time of interpretation on 01/30/2016 at 4:17 pm to Dr. Harvest Dark , who verbally acknowledged these results. Electronically Signed   By: San Morelle M.D.   On: 01/30/2016 16:18     ASSESSMENT AND PLAN:   * Subacute left parietal infarct -Check MRI of the brain, Carotid dopplers, Echo - Started aspirin and statin. Add plavix - Lovenox for DVT prophylaxis. -  PT/OT/Speech consult as needed per symptoms - Neuro checks every 4 hours for 24 hours. - Consult neurology. Discussed with Dr. Doy Mince  All the records are reviewed and case discussed with Care Management/Social Workerr. Management plans discussed with the patient, family and they are in agreement.  CODE STATUS: FULL CODE  DVT Prophylaxis: SCDs  TOTAL TIME TAKING CARE OF THIS PATIENT: 30 minutes.   POSSIBLE D/C IN 1-2 DAYS, DEPENDING ON CLINICAL CONDITION.  Hillary Bow R M.D on 01/31/2016 at 3:22 PM  Between 7am to 6pm - Pager - [phone]  After 6pm go to [email or web address] - password [email or web address] [facility]  Office  [phone]  CC: Primary care physician; Juluis Pitch, MD  Note: This dictation was prepared with Dragon dictation along with smaller phrase technology. Any transcriptional errors that result from this process are unintentional.

## 2016-01-31 NOTE — Progress Notes (Signed)
Taylor Austin made a follow-up visit with the Pt. Pt in the Rm with wife bedside. Pt talked with Ecru about his health struggles, [employer] he attended, and family concerns in general. Pt delightful and calm. Stout encouraged Pt and offered the ministry of compassion and presence.    01/31/16 1500  Clinical Encounter Type  Visited With Patient;Family  Visit Type Follow-up;Spiritual support;Other (Comment)  Referral From Chaplain  Consult/Referral To [place];Other (Comment)

## 2016-01-31 NOTE — Discharge Instructions (Signed)
Resume diet and activity as before ° ° °

## 2016-01-31 NOTE — Progress Notes (Signed)
*  PRELIMINARY RESULTS* Echocardiogram 2D Echocardiogram has been performed.  Sherrie Sport 01/31/2016, 8:41 AM

## 2016-01-31 NOTE — Evaluation (Signed)
Occupational Therapy Evaluation Patient Details Name: Taylor Austin MRN: [id number] DOB: June 09, 1940 Today's Date: 01/31/2016    History of Present Illness 76 y.o. male with a known history of hypertension, diabetes mellitus type 2, hyperlipidemia, overactive bladder comes in because of slurred speech, difficulty getting words out. Patient says that he had the at least 3 episodes of slurred speech the in this month.    Clinical Impression   Pt up in recliner for OT evaluation, wife present for duration. Pt tolerated session well despite 6/10 pain from headache. Pt continues to experience some minor word finding and slurring of speech which is frustrating for him but no physical/functional deficits noted. Pt did report some difficulty with LB dressing tasks at times and has wife assist him with socks occasionally. Pt educated in AE options for LB dressing tasks and pt agreeable and would benefit from skilled OT services focused on A/E training to improve functional independence and minimize need to bend forward as much thus restricting breathing and increasing risk for falls due to noted impairments in pain, activity tolerance, and decreased knowledge of AE/DME. Pt and spouse also educated in home modifications and DME to support safety in the home environment, especially the bathroom. No OT follow up after hospital stay recommended.     Follow Up Recommendations  No OT follow up    Equipment Recommendations  Other (comment) (possibly AE for LB dressing: reacher, sock aid, long handled shoe horn after trailing)    Recommendations for Other Services       Precautions / Restrictions Precautions Precautions: Fall Restrictions Weight Bearing Restrictions: No      Mobility Bed Mobility Overal bed mobility: Independent             General bed mobility comments: not tested, pt up in recliner durnig session  Transfers Overall transfer level: Independent Equipment used: None             General transfer comment: good safety and confidence during sit<>stand transfers    Balance Overall balance assessment: Independent                                          ADL Overall ADL's : Needs assistance/impaired                     Lower Body Dressing: Supervision/safety;Cueing for compensatory techniques;Sitting/lateral leans Lower Body Dressing Details (indicate cue type and reason): pt doffed/donned socks with some difficulty and additional time but no physical assist required; spouse reports assisting at times  Toilet Transfer: Modified Independent           Functional mobility during ADLs: Modified independent General ADL Comments: Pt would benefit from training/education in AE for LB dressing to improve independence prior to returning home; pt/spouse educated in home mods/falls prevention strategies in the home to minimize falls risk     Vision Vision Assessment?: No apparent visual deficits   Perception     Praxis      Pertinent Vitals/Pain Pain Assessment: 0-10 Pain Score: 6  Pain Location: neck/headache "front resting my head on this pillow", chronic back pain present as well Pain Intervention(s): Limited activity within patient's tolerance;Monitored during session;Patient requesting pain meds-RN notified     Hand Dominance     Extremity/Trunk Assessment Upper Extremity Assessment Upper Extremity Assessment: Overall WFL for tasks assessed   Lower Extremity Assessment  Lower Extremity Assessment: Overall WFL for tasks assessed   Cervical / Trunk Assessment Cervical / Trunk Assessment: Normal   Communication Communication Communication: Expressive difficulties (very limited word finding and slurring difficulties during session)   Cognition Arousal/Alertness: Awake/alert Behavior During Therapy: WFL for tasks assessed/performed Overall Cognitive Status: Within Functional Limits for tasks assessed                      General Comments       Exercises       Shoulder Instructions      Home Living Family/patient expects to be discharged to:: Private residence Living Arrangements: Spouse/significant other Available Help at Discharge: Family;Available 24 hours/day Type of Home: Other(Comment) (townhouse) Home Access: Stairs to enter [employer] of Steps: 2 Entrance Stairs-Rails: None Home Layout: Two level Alternate Level Stairs-Number of Steps: flight Alternate Level Stairs-Rails:  (yes) Bathroom Shower/Tub: Tub/shower unit Shower/tub characteristics: Curtain Biochemist, clinical: Standard Bathroom Accessibility: No   Home Equipment: Environmental consultant - 2 wheels;Cane - single point   Additional Comments: no grab bars      Prior Functioning/Environment Level of Independence: Independent        Comments: Pt is able to go shopping, drive, be out and regularly active        OT Problem List: Pain;Decreased knowledge of use of DME or AE;Decreased activity tolerance   OT Treatment/Interventions: Self-care/ADL training;DME and/or AE instruction;Patient/family education    OT Goals(Current goals can be found in the care plan section) Acute Rehab OT Goals Patient Stated Goal: go home OT Goal Formulation: With patient/family Time For Goal Achievement: 02/07/16 Potential to Achieve Goals: Good  OT Frequency: Min 1X/week   Barriers to D/C:            Co-evaluation              End of Session    Activity Tolerance: Patient tolerated treatment well Patient left: in chair;with family/visitor present (chaplain entered room at end of session)   Time: 1330-1410 OT Time Calculation (min): 40 min Charges:  OT General Charges $OT Visit: 1 Procedure OT Evaluation $OT Eval Low Complexity: 1 Procedure OT Treatments $Self Care/Home Management : 23-37 mins G-Codes:    Corky Sox, OTR/L 01/31/2016, 2:57 PM

## 2016-01-31 NOTE — Evaluation (Signed)
Physical Therapy Evaluation Patient Details Name: Taylor Austin MRN: [id number] DOB: Apr 21, 1940 Today's Date: 01/31/2016   History of Present Illness  76 y.o. male with a known history of hypertension, diabetes mellitus type 2, hyperlipidemia, overactive bladder comes in because of slurred speech, difficulty getting words out. Patient says that he had the at least 3 episodes of slurred speech the in this month.   Clinical Impression  Pt is able to ambulate well with no safety issues and generally reports being at his baseline physically.  He does have continued issues with word finding and slurring but regarding PT issues he is independent and at his baseline.  Pt with some fatigue with prolonged ambulation (~250 ft) but ultimately did well and negotiated steps w/o a problem.  Pt does not require further PT intervention at this time.     Follow Up Recommendations No PT follow up    Equipment Recommendations       Recommendations for Other Services       Precautions / Restrictions Precautions Precautions: Fall Restrictions Weight Bearing Restrictions: No      Mobility  Bed Mobility Overal bed mobility: Independent             General bed mobility comments: Pt able to rise to sitting EOB w/o issue  Transfers Overall transfer level: Independent Equipment used: None             General transfer comment: Pt is able to rise to standing with good safety and confidence  Ambulation/Gait Ambulation/Gait assistance: Modified independent (Device/Increase time) Ambulation Distance (Feet): 250 Feet Assistive device: None       General Gait Details: Pt walked with good confidence and consistent speed.  He did have some fatigue, but O2 remained in the 90s - pt with no overt safety issues.   Stairs Stairs: Yes Stairs assistance: Modified independent (Device/Increase time) Stair Management: One rail Right Number of Stairs: 15 General stair comments: Pt able to ascend  steps reciprocally, needed step-to for descent  Wheelchair Mobility    Modified Rankin (Stroke Patients Only)       Balance Overall balance assessment: Independent                                           Pertinent Vitals/Pain Pain Assessment:  (just chronic back pain and neck pain from pillow)    Home Living Family/patient expects to be discharged to:: Private residence Living Arrangements: Spouse/significant other Available Help at Discharge: Family   Home Access: Stairs to enter   Technical brewer of Steps: 2 Home Layout: Two level        Prior Function Level of Independence: Independent         Comments: Pt is able to go shopping, drive, be out and regularly acitve     Hand Dominance        Extremity/Trunk Assessment   Upper Extremity Assessment Upper Extremity Assessment: Overall WFL for tasks assessed    Lower Extremity Assessment Lower Extremity Assessment: Overall WFL for tasks assessed       Communication   Communication: Expressive difficulties (some limited word finding issues and slurring)  Cognition Arousal/Alertness: Awake/alert Behavior During Therapy: WFL for tasks assessed/performed Overall Cognitive Status: Within Functional Limits for tasks assessed                      General  Comments      Exercises     Assessment/Plan    PT Assessment Patent does not need any further PT services  PT Problem List            PT Treatment Interventions      PT Goals (Current goals can be found in the Care Plan section)  Acute Rehab PT Goals Patient Stated Goal: go home PT Goal Formulation: With patient    Frequency     Barriers to discharge        Co-evaluation               End of Session Equipment Utilized During Treatment: Gait belt Activity Tolerance: Patient tolerated treatment well Patient left: with call bell/phone within reach;in chair;with family/visitor present            Time: 1139-1202 PT Time Calculation (min) (ACUTE ONLY): 23 min   Charges:   PT Evaluation $PT Eval Low Complexity: 1 Procedure     PT G CodesKreg Shropshire, DPT 01/31/2016, 12:33 PM

## 2016-01-31 NOTE — Consult Note (Signed)
Referring Physician: Sudini    Chief Complaint: Difficulty with speech  HPI: Taylor Austin is an 76 y.o. male who reported with an episode of difficulty with speech.  The patient reports that his first event was on December 30.  Reports an episode when he couldn't get his words out and had a right facial droop/generalized weakness.  This episode resolved after about 15 minutes.  Patient had a recurrent episode that lasted all day about two weeks later.  On the day of presentation patient had another event.  Wife convinced him to present for evaluation at that time.  Patient reports this episode lasting about 4-5 hours before spontaneously resolving.  Patient still feels he has some minor dificulties with his speech.   Initial NIHSS of 1  Date last known well: Date: 01/30/2016 Time last known well: Time: 14:30 tPA Given: No: Resolution of symptoms  Past Medical History:  Diagnosis Date  . Allergy   . Arthritis   . Back pain   . Barrett esophagus   . BPH (benign prostatic hyperplasia)   . Chronic kidney disease   . Coronary artery disease   . Depression   . Diabetes mellitus without complication ([place])   . Diverticulitis   . Dysphagia   . Esophageal reflux   . GERD (gastroesophageal reflux disease)   . Hyperlipidemia   . Hypersomnia   . Hypertension   . Kidney stones   . Low testosterone   . OAB (overactive bladder)   . Rheumatic fever   . Sleep apnea   . Vitamin B12 deficiency     Past Surgical History:  Procedure Laterality Date  . BACK SURGERY    . CARDIAC CATHETERIZATION  08/11/2014   Procedure: Left Heart Cath and Coronary Angiography;  Surgeon: Teodoro Spray, MD;  Location: [place] [facility];  Service: Cardiovascular;;  . CARDIAC CATHETERIZATION N/A 11/17/2014   Procedure: Right Heart Cath;  Surgeon: Teodoro Spray, MD;  Location: [place] [facility];  Service: Cardiovascular;  Laterality: N/A;  . CAROTID STENT    . CERVICAL SPINE SURGERY    . COLONOSCOPY    .  COLONOSCOPY WITH PROPOFOL N/A 06/10/2015   Procedure: COLONOSCOPY WITH PROPOFOL;  Surgeon: Manya Silvas, MD;  Location: [facility];  Service: Endoscopy;  Laterality: N/A;  . CORONARY ANGIOPLASTY    . ESOPHAGOGASTRODUODENOSCOPY ENDOSCOPY    . LUMBAR SPINE SURGERY    . THORACIC SPINE SURGERY    . TONSILLECTOMY      Family History  Problem Relation Age of Onset  . Stroke Father   . Anesthesia problems Father   . Anesthesia problems Mother   . Prostate cancer Brother   . Kidney disease Neg Hx   . Bladder Cancer Neg Hx    Social History:  reports that he quit smoking about 42 years ago. His smoking use included Cigarettes. He has never used smokeless tobacco. He reports that he drinks about 0.6 oz of alcohol per week . He reports that he does not use drugs.  Allergies:  Allergies  Allergen Reactions  . Hydrocodone Shortness Of Breath  . Oxycodone-Acetaminophen Shortness Of Breath    Medications:  I have reviewed the patient's current medications. Prior to Admission:  Prescriptions Prior to Admission  Medication Sig Dispense Refill Last Dose  . aspirin EC 81 MG tablet Take 81 mg by mouth daily.   01/30/2016 at 1000  . Cyanocobalamin (VITAMIN B-12 PO) Take by mouth.   01/30/2016 at 1000  .  finasteride (PROSCAR) 5 MG tablet Take 1 tablet (5 mg total) by mouth daily. 90 tablet 4 01/30/2016 at 1000  . glyBURIDE-metformin (GLUCOVANCE) 5-500 MG per tablet TAKE ONE TABLET BY MOUTH TWICE DAILY WITH MEALS   01/30/2016 at 1000  . magnesium oxide (MAG-OX) 400 MG tablet Take 400 mg by mouth daily.   01/30/2016 at 1000  . Multiple Vitamin (MULTI-VITAMINS) TABS Take 1 tablet by mouth daily.    01/30/2016 at 1000  . oxymetazoline (AFRIN) 0.05 % nasal spray Place 1 spray into both nostrils 2 (two) times daily.   prn at prn  . pioglitazone (ACTOS) 45 MG tablet Take 45 mg by mouth daily.   1 01/30/2016 at 1000  . Probiotic Product (PROBIOTIC PO) Take by mouth.   01/30/2016 at 1000  . simvastatin  (ZOCOR) 40 MG tablet Take 40 mg by mouth daily at 6 PM.   5 01/29/2016 at pm  . umeclidinium-vilanterol (ANORO ELLIPTA) 62.5-25 MCG/INH AEPB Inhale 1 puff into the lungs daily.   01/30/2016 at Unknown time  . mirabegron ER (MYRBETRIQ) 50 MG TB24 tablet Take 1 tablet (50 mg total) by mouth daily. (Patient not taking: Reported on 01/30/2016) 30 tablet 11 Not Taking at Unknown time   Scheduled: . aspirin EC  81 mg Oral Daily  . enoxaparin (LOVENOX) injection  40 mg Subcutaneous Q12H  . finasteride  5 mg Oral Daily  . glyBURIDE  5 mg Oral BID WC   And  . metFORMIN  500 mg Oral BID WC  . insulin aspart  0-15 Units Subcutaneous TID WC  . pioglitazone  45 mg Oral Daily  . simvastatin  40 mg Oral q1800  . umeclidinium-vilanterol  1 puff Inhalation QHS    ROS: History obtained from the patient  General ROS: negative for - chills, fatigue, fever, night sweats, weight gain or weight loss Psychological ROS: memory difficulties for the past 6 months Ophthalmic ROS: negative for - blurry vision, double vision, eye pain or loss of vision ENT ROS: negative for - epistaxis, nasal discharge, oral lesions, sore throat, tinnitus or vertigo Allergy and Immunology ROS: negative for - hives or itchy/watery eyes Hematological and Lymphatic ROS: negative for - bleeding problems, bruising or swollen lymph nodes Endocrine ROS: negative for - galactorrhea, hair pattern changes, polydipsia/polyuria or temperature intolerance Respiratory ROS: negative for - cough, hemoptysis, shortness of breath or wheezing Cardiovascular ROS: negative for - chest pain, dyspnea on exertion, edema or irregular heartbeat Gastrointestinal ROS: negative for - abdominal pain, diarrhea, hematemesis, nausea/vomiting or stool incontinence Genito-Urinary ROS: negative for - dysuria, hematuria, incontinence or urinary frequency/urgency Musculoskeletal ROS: negative for - joint swelling or muscular weakness Neurological ROS: as noted in HPI,  gait off balance Dermatological ROS: negative for rash and skin lesion changes  Physical Examination: Blood pressure (!) 146/65, pulse 69, temperature 97.9 F (36.6 C), temperature source Oral, resp. rate 20, height 5\' 6"  (1.676 m), weight 122.5 kg (270 lb), SpO2 91 %.  HEENT-  Normocephalic, no lesions, without obvious abnormality.  Normal external eye and conjunctiva.  Normal TM's bilaterally.  Normal auditory canals and external ears. Normal external nose, mucus membranes and septum.  Normal pharynx. Cardiovascular- S1, S2 normal, pulses palpable throughout   Lungs- chest clear, no wheezing, rales, normal symmetric air entry Abdomen- soft, non-tender; bowel sounds normal; no masses,  no organomegaly Extremities- no edema Lymph-no adenopathy palpable Musculoskeletal-no joint tenderness, deformity or swelling Skin-scratches on BLE's  Neurological Examination Mental Status: Alert, oriented, thought content appropriate.  Speech fluent without evidence of aphasia.  Able to follow 3 step commands without difficulty. Cranial Nerves: II: Discs flat bilaterally; Visual fields grossly normal, pupils equal, round, reactive to light and accommodation III,IV, VI: ptosis not present, extra-ocular motions intact bilaterally V,VII: mild right facial droop, facial light touch sensation normal bilaterally VIII: hearing normal bilaterally IX,X: gag reflex present XI: bilateral shoulder shrug XII: midline tongue extension Motor: Right : Upper extremity   5/5    Left:     Upper extremity   5/5  Lower extremity   5/5     Lower extremity   5/5 Tone and bulk:normal tone throughout; no atrophy noted Sensory: Pinprick and light touch intact throughout, bilaterally Deep Tendon Reflexes: 1+ and symmetric with absent AJ's bilaterally Plantars: Right: upgoing   Left: downgoing Cerebellar: Normal finger-to-nose and normal heel-to-shin testing bilaterally Gait: not tested due to safety concerns     Laboratory Studies:  Basic Metabolic Panel:  Recent Labs Lab 01/30/16 1608  NA 139  K 4.5  CL 101  CO2 30  GLUCOSE 124*  BUN 20  CREATININE 0.94  CALCIUM 9.7    Liver Function Tests:  Recent Labs Lab 01/30/16 1608  AST 25  ALT 21  ALKPHOS 50  BILITOT 0.5  PROT 7.1  ALBUMIN 4.2   No results for input(s): LIPASE, AMYLASE in the last 168 hours. No results for input(s): AMMONIA in the last 168 hours.  CBC:  Recent Labs Lab 01/30/16 1608  WBC 6.8  NEUTROABS 4.4  HGB 14.6  HCT 42.6  MCV 92.5  PLT 167    Cardiac Enzymes:  Recent Labs Lab 01/30/16 1608  TROPONINI <0.03    BNP: Invalid input(s): POCBNP  CBG:  Recent Labs Lab 01/30/16 1609 01/30/16 1941 01/31/16 0722 01/31/16 1138  GLUCAP 112* 102* 150* 144*    Microbiology: Results for orders placed or performed in visit on 03/01/15  Microscopic Examination     Status: Abnormal   Collection Time: 03/01/15  2:20 PM  Result Value Ref Range Status   WBC, UA 0-5 0 - 5 /hpf Final   RBC, UA 0-2 0 - 2 /hpf Final   Epithelial Cells (non renal) None seen 0 - 10 /hpf Final   Casts Present (A) None seen /lpf Final   Cast Type Hyaline casts N/A Final   Mucus, UA Present (A) Not Estab. Final   Bacteria, UA None seen None seen/Few Final    Coagulation Studies:  Recent Labs  01/30/16 1608  LABPROT 12.1  INR 0.90    Urinalysis: No results for input(s): COLORURINE, LABSPEC, PHURINE, GLUCOSEU, HGBUR, BILIRUBINUR, KETONESUR, PROTEINUR, UROBILINOGEN, NITRITE, LEUKOCYTESUR in the last 168 hours.  Invalid input(s): APPERANCEUR  Lipid Panel:    Component Value Date/Time   CHOL 170 01/31/2016 0448   TRIG 372 (H) 01/31/2016 0448   HDL 37 (L) 01/31/2016 0448   CHOLHDL 4.6 01/31/2016 0448   VLDL 74 (H) 01/31/2016 0448   LDLCALC 59 01/31/2016 0448    HgbA1C: No results found for: HGBA1C  Urine Drug Screen:  No results found for: LABOPIA, COCAINSCRNUR, LABBENZ, AMPHETMU, THCU, LABBARB  Alcohol  Level: No results for input(s): ETH in the last 168 hours.  Other results: EKG: sinus rhythm at 93 bpm.  Imaging: Mr Brain Wo Contrast  Result Date: 01/31/2016 CLINICAL DATA:  76 year old diabetic hypertensive male with 3 episodes of slurred speech this past month. Subsequent encounter. EXAM: MRI HEAD WITHOUT CONTRAST MRA HEAD WITHOUT CONTRAST TECHNIQUE: Multiplanar, multiecho pulse sequences  of the brain and surrounding structures were obtained without intravenous contrast. Angiographic images of the head were obtained using MRA technique without contrast. COMPARISON:  01/30/2016 head CT.  11/19/2013 brain MR. FINDINGS: MRI HEAD FINDINGS Brain: Left parietal subcortical white matter hyperintensity appears larger than on the prior exam and may represent combination of result of chronic microvascular changes and small subacute infarct contributing to T2 shine through on diffusion sequence. No acute infarct is seen separate from this region. Remote small infarct right globus pallidus/ med corona radiata. Remote small infarct anterior and posterior aspect of the left corona radiata. Chronic microvascular changes. No intracranial hemorrhage. No intracranial mass lesion noted on this unenhanced exam. Mild global atrophy without hydrocephalus. Focal calcification right aspect of the anterior falx unchanged. Vascular: Major intracranial vascular structures are patent. Please see below. Skull and upper cervical spine: Mild degenerative changes upper cervical spine. Cervicomedullary junction within normal limits. Sinuses/Orbits: Exophthalmos.  Normal symmetric extraocular muscles. Polypoid opacification superior aspect left maxillary sinus. Other: Negative. MRA HEAD FINDINGS Anterior circulation without medium or large size vessel significant stenosis or occlusion. Fetal contribution to the right posterior cerebral artery. Mild branch vessel irregularity middle cerebral artery bilaterally and A2 segment left anterior  cerebral artery. Mild irregularity without significant stenosis of the distal vertebral arteries or basilar artery. Small caliber right posterior inferior cerebellar artery. Nonvisualized anterior inferior cerebellar artery bilaterally. Only the proximal aspect of the right superior cerebellar artery is visualized. Mild narrowing and irregularity of the distal branches of the posterior cerebral artery bilaterally. No aneurysm or vascular malformation noted. IMPRESSION: MRI HEAD Left parietal subcortical white matter hyperintensity appears larger than on the prior exam and may represent combination of result of chronic microvascular changes and small subacute infarct contributing to T2 shine through on diffusion sequence. No acute infarct is seen separate from this region. Remote small infarct right globus pallidus/ med corona radiata. Remote small infarct anterior and posterior aspect of the left corona radiata. Chronic microvascular changes. MRA HEAD No large vessel occlusion. Mild branch vessel atherosclerotic changes as noted above. Electronically Signed   By: Genia Del M.D.   On: 01/31/2016 09:45   US Carotid Bilateral  Result Date: 01/31/2016 CLINICAL DATA:  CVA. EXAM: BILATERAL CAROTID DUPLEX ULTRASOUND TECHNIQUE: Pearline Cables scale imaging, color Doppler and duplex ultrasound were performed of bilateral carotid and vertebral arteries in the neck. COMPARISON:  01/31/2016. FINDINGS: Criteria: Quantification of carotid stenosis is based on velocity parameters that correlate the residual internal carotid diameter with NASCET-based stenosis levels, using the diameter of the distal internal carotid lumen as the denominator for stenosis measurement. The following velocity measurements were obtained: RIGHT ICA:  72/23 cm/sec CCA:  0000000 cm/sec SYSTOLIC ICA/CCA RATIO:  0.9 DIASTOLIC ICA/CCA RATIO:  1.2 ECA:  123 cm/sec LEFT ICA:  66/14 cm/sec CCA:  99991111 cm/sec SYSTOLIC ICA/CCA RATIO:  0.9 DIASTOLIC ICA/CCA RATIO:   0.8 ECA:  90 cm/sec RIGHT CAROTID ARTERY: Mild atherosclerotic plaque right carotid bifurcation. No flow limiting stenosis. RIGHT VERTEBRAL ARTERY:  Patent with antegrade flow. LEFT CAROTID ARTERY: Mild atherosclerotic plaque left carotid bifurcation. LEFT VERTEBRAL ARTERY:  Patent with antegrade flow. IMPRESSION: 1. Mild bilateral carotid bifurcation atherosclerotic vascular plaque. No flow limiting stenosis. Degree of stenosis less than 50% bilaterally. 2.  Vertebrals are patent with antegrade flow. Electronically Signed   By: Marcello Moores  Register   On: 01/31/2016 10:46   Mr Jodene Nam Head/brain Wo Cm  Result Date: 01/31/2016 CLINICAL DATA:  76 year old diabetic hypertensive male with 3 episodes  of slurred speech this past month. Subsequent encounter. EXAM: MRI HEAD WITHOUT CONTRAST MRA HEAD WITHOUT CONTRAST TECHNIQUE: Multiplanar, multiecho pulse sequences of the brain and surrounding structures were obtained without intravenous contrast. Angiographic images of the head were obtained using MRA technique without contrast. COMPARISON:  01/30/2016 head CT.  11/19/2013 brain MR. FINDINGS: MRI HEAD FINDINGS Brain: Left parietal subcortical white matter hyperintensity appears larger than on the prior exam and may represent combination of result of chronic microvascular changes and small subacute infarct contributing to T2 shine through on diffusion sequence. No acute infarct is seen separate from this region. Remote small infarct right globus pallidus/ med corona radiata. Remote small infarct anterior and posterior aspect of the left corona radiata. Chronic microvascular changes. No intracranial hemorrhage. No intracranial mass lesion noted on this unenhanced exam. Mild global atrophy without hydrocephalus. Focal calcification right aspect of the anterior falx unchanged. Vascular: Major intracranial vascular structures are patent. Please see below. Skull and upper cervical spine: Mild degenerative changes upper cervical  spine. Cervicomedullary junction within normal limits. Sinuses/Orbits: Exophthalmos.  Normal symmetric extraocular muscles. Polypoid opacification superior aspect left maxillary sinus. Other: Negative. MRA HEAD FINDINGS Anterior circulation without medium or large size vessel significant stenosis or occlusion. Fetal contribution to the right posterior cerebral artery. Mild branch vessel irregularity middle cerebral artery bilaterally and A2 segment left anterior cerebral artery. Mild irregularity without significant stenosis of the distal vertebral arteries or basilar artery. Small caliber right posterior inferior cerebellar artery. Nonvisualized anterior inferior cerebellar artery bilaterally. Only the proximal aspect of the right superior cerebellar artery is visualized. Mild narrowing and irregularity of the distal branches of the posterior cerebral artery bilaterally. No aneurysm or vascular malformation noted. IMPRESSION: MRI HEAD Left parietal subcortical white matter hyperintensity appears larger than on the prior exam and may represent combination of result of chronic microvascular changes and small subacute infarct contributing to T2 shine through on diffusion sequence. No acute infarct is seen separate from this region. Remote small infarct right globus pallidus/ med corona radiata. Remote small infarct anterior and posterior aspect of the left corona radiata. Chronic microvascular changes. MRA HEAD No large vessel occlusion. Mild branch vessel atherosclerotic changes as noted above. Electronically Signed   By: Genia Del M.D.   On: 01/31/2016 09:45   Ct Head Code Stroke W/o Cm  Result Date: 01/30/2016 CLINICAL DATA:  Code stroke. New onset slurred speech beginning 1.5 hours ago. Difficulty speaking. Headache. EXAM: CT HEAD WITHOUT CONTRAST TECHNIQUE: Contiguous axial images were obtained from the base of the skull through the vertex without intravenous contrast. COMPARISON:  MRI brain 11/19/2013.   CTA of the neck 01/04/2014. FINDINGS: Brain: No acute cortical infarct is present. Scattered white matter changes are again seen. A lacunar infarct in the right internal capsule was not present on the prior study, but appears remote. The brainstem and cerebellum are within normal limits. The ventricles are proportionate to the degree of atrophy. No significant extra-axial fluid collection is present. Vascular: No hyperdense vessel or unexpected calcification. Skull: Normal. Negative for fracture or focal lesion. Sinuses/Orbits: The paranasal sinuses and the mastoid air cells are clear. ASPECTS [facility] Stroke Program Early CT Score) - Ganglionic level infarction (caudate, lentiform nuclei, internal capsule, insula, M1-M3 cortex): 7/7 - Supraganglionic infarction (M4-M6 cortex): 3/3 Total score (0-10 with 10 being normal): 10/10 IMPRESSION: 1. No acute intracranial abnormality. 2. Mild atrophy and white matter disease is again seen. This likely reflects the sequela of chronic microvascular ischemia. 3. Lacunar infarct within  the right internal capsule was not present on the prior study, but appears remote. 4. ASPECTS is 10/10 These results were called by telephone at the time of interpretation on 01/30/2016 at 4:17 pm to Dr. Harvest Dark , who verbally acknowledged these results. Electronically Signed   By: San Morelle M.D.   On: 01/30/2016 16:18    Assessment: 76 y.o. male presenting after multiple episodes of right facial droop and difficulty with speech for the past month.  Patient started on ASA about 2 weeks ago with his second episode.  MRI of the brain reviewed and shows a subacute left parietal infarct.  Carotid dopplers show no evidence of hemodynamically significant stenosis.  Echocardiogram pending.  A1c pending, LDL 59.  Stroke Risk Factors - diabetes mellitus, hyperlipidemia and hypertension  Plan: 1. EEG 2. PT consult, OT consult, Speech consult 3. Prophylactic therapy-Continue  ASA 81 mg and add Plavix 75mg  daily 4. Telemetry monitoring 5. Frequent neuro checks 6. Continue statin   Alexis Goodell, MD Neurology [phone] 01/31/2016, 12:06 PM

## 2016-02-01 LAB — GLUCOSE, CAPILLARY
GLUCOSE-CAPILLARY: 174 mg/dL — AB (ref 65–99)
Glucose-Capillary: 153 mg/dL — ABNORMAL HIGH (ref 65–99)
Glucose-Capillary: 141 mg/dL — ABNORMAL HIGH (ref 65–99)
Glucose-Capillary: 169 mg/dL — ABNORMAL HIGH (ref 65–99)

## 2016-02-01 LAB — HEMOGLOBIN A1C
HEMOGLOBIN A1C: 6.6 % — AB (ref 4.8–5.6)
MEAN PLASMA GLUCOSE: 143 mg/dL

## 2016-02-01 LAB — TSH: TSH: 1.949 u[IU]/mL (ref 0.350–4.500)

## 2016-02-01 LAB — SEDIMENTATION RATE: SED RATE: 6 mm/h (ref 0–20)

## 2016-02-01 LAB — VITAMIN B12: Vitamin B-12: 842 pg/mL (ref 180–914)

## 2016-02-01 LAB — FOLATE: Folate: 33 ng/mL (ref >5.9)

## 2016-02-01 MED ORDER — KETOROLAC TROMETHAMINE 30 MG/ML IJ SOLN
30.0000 mg | Freq: Once | INTRAMUSCULAR | Status: AC
  Administered 2016-02-01: 30 mg via INTRAVENOUS
  Filled 2016-02-01: qty 1

## 2016-02-01 NOTE — Progress Notes (Signed)
OT Cancellation Note  Patient Details Name: HANH SMELSER MRN: [id number] DOB: 1940-10-07   Cancelled Treatment:    Reason Eval/Treat Not Completed: Pain limiting ability to participate. Upon entry into room, lights off, blinds drawn, pt in bed with spouse at bedside both reporting that pt had another episode of increased difficulty with word finding and slurred speech as well as R sided weakness. BUE strength/ROM assessed to be [facility] at 2:08pm. Spouse reports having notified nsg who was going to bring medications momentarily. Pt declined to participated in OT tx session due to pain (unable to verbalize, but did say it wasn't as bad as it was earlier) and waiting for meds. Will re-attempt OT tx session at later date/time as appropriate.  Corky Sox, OTR/L 02/01/2016, 2:14 PM

## 2016-02-01 NOTE — Care Management Important Message (Signed)
Important Message  Patient Details  Name: Taylor Austin MRN: [id number] Date of Birth: October 22, 1940   Medicare Important Message Given:  Yes    Shelbie Ammons, RN 02/01/2016, 7:29 AM

## 2016-02-01 NOTE — Progress Notes (Signed)
[facility] NAME: Taylor Austin    MR#:  [id number]  DATE OF BIRTH:  November 22, 1940  SUBJECTIVE:  CHIEF COMPLAINT:   Chief Complaint  Patient presents with  . Code Stroke   Paged by nurse. Symptoms had improved but now has word finding difficulty again. Headache has resolved REVIEW OF SYSTEMS:    Review of Systems  Constitutional: Negative for chills and fever.  HENT: Negative for sore throat.   Eyes: Negative for blurred vision, double vision and pain.  Respiratory: Negative for cough, hemoptysis, shortness of breath and wheezing.   Cardiovascular: Negative for chest pain, palpitations, orthopnea and leg swelling.  Gastrointestinal: Negative for abdominal pain, constipation, diarrhea, heartburn, nausea and vomiting.  Genitourinary: Negative for dysuria and hematuria.  Musculoskeletal: Negative for back pain and joint pain.  Skin: Negative for rash.  Neurological: Positive for speech change. Negative for sensory change, focal weakness and headaches.  Endo/Heme/Allergies: Does not bruise/bleed easily.  Psychiatric/Behavioral: Negative for depression. The patient is not nervous/anxious.     DRUG ALLERGIES:   Allergies  Allergen Reactions  . Hydrocodone Shortness Of Breath  . Oxycodone-Acetaminophen Shortness Of Breath    VITALS:  Blood pressure (!) 156/86, pulse 78, temperature 98 F (36.7 C), temperature source Oral, resp. rate 18, height 5\' 6"  (1.676 m), weight 122.5 kg (270 lb), SpO2 97 %.  PHYSICAL EXAMINATION:   Physical Exam  GENERAL:  76 y.o.-year-old patient lying in the bed with no acute distress.  EYES: Pupils equal, round, reactive to light and accommodation. No scleral icterus. Extraocular muscles intact.  HEENT: Head atraumatic, normocephalic. Oropharynx and nasopharynx clear.  NECK:  Supple, no jugular venous distention. No thyroid enlargement, no tenderness.  LUNGS: Normal breath sounds bilaterally, no  wheezing, rales, rhonchi. No use of accessory muscles of respiration.  CARDIOVASCULAR: S1, S2 normal. No murmurs, rubs, or gallops.  ABDOMEN: Soft, nontender, nondistended. Bowel sounds present. No organomegaly or mass.  EXTREMITIES: No cyanosis, clubbing or edema b/l.    NEUROLOGIC: Cranial nerves II through XII are intact. No focal Motor or sensory deficits b/l.   Expressive aphasia PSYCHIATRIC: The patient is alert and oriented x 3.  SKIN: No obvious rash, lesion, or ulcer.   LABORATORY PANEL:   CBC  Recent Labs Lab 01/30/16 1608  WBC 6.8  HGB 14.6  HCT 42.6  PLT 167   ------------------------------------------------------------------------------------------------------------------ Chemistries   Recent Labs Lab 01/30/16 1608  NA 139  K 4.5  CL 101  CO2 30  GLUCOSE 124*  BUN 20  CREATININE 0.94  CALCIUM 9.7  AST 25  ALT 21  ALKPHOS 50  BILITOT 0.5   ------------------------------------------------------------------------------------------------------------------  Cardiac Enzymes  Recent Labs Lab 01/30/16 1608  TROPONINI <0.03   ------------------------------------------------------------------------------------------------------------------  RADIOLOGY:  Mr Brain Wo Contrast  Result Date: 01/31/2016 CLINICAL DATA:  76 year old diabetic hypertensive male with 3 episodes of slurred speech this past month. Subsequent encounter. EXAM: MRI HEAD WITHOUT CONTRAST MRA HEAD WITHOUT CONTRAST TECHNIQUE: Multiplanar, multiecho pulse sequences of the brain and surrounding structures were obtained without intravenous contrast. Angiographic images of the head were obtained using MRA technique without contrast. COMPARISON:  01/30/2016 head CT.  11/19/2013 brain MR. FINDINGS: MRI HEAD FINDINGS Brain: Left parietal subcortical white matter hyperintensity appears larger than on the prior exam and may represent combination of result of chronic microvascular changes and small  subacute infarct contributing to T2 shine through on diffusion sequence. No acute infarct is seen separate from this region.  Remote small infarct right globus pallidus/ med corona radiata. Remote small infarct anterior and posterior aspect of the left corona radiata. Chronic microvascular changes. No intracranial hemorrhage. No intracranial mass lesion noted on this unenhanced exam. Mild global atrophy without hydrocephalus. Focal calcification right aspect of the anterior falx unchanged. Vascular: Major intracranial vascular structures are patent. Please see below. Skull and upper cervical spine: Mild degenerative changes upper cervical spine. Cervicomedullary junction within normal limits. Sinuses/Orbits: Exophthalmos.  Normal symmetric extraocular muscles. Polypoid opacification superior aspect left maxillary sinus. Other: Negative. MRA HEAD FINDINGS Anterior circulation without medium or large size vessel significant stenosis or occlusion. Fetal contribution to the right posterior cerebral artery. Mild branch vessel irregularity middle cerebral artery bilaterally and A2 segment left anterior cerebral artery. Mild irregularity without significant stenosis of the distal vertebral arteries or basilar artery. Small caliber right posterior inferior cerebellar artery. Nonvisualized anterior inferior cerebellar artery bilaterally. Only the proximal aspect of the right superior cerebellar artery is visualized. Mild narrowing and irregularity of the distal branches of the posterior cerebral artery bilaterally. No aneurysm or vascular malformation noted. IMPRESSION: MRI HEAD Left parietal subcortical white matter hyperintensity appears larger than on the prior exam and may represent combination of result of chronic microvascular changes and small subacute infarct contributing to T2 shine through on diffusion sequence. No acute infarct is seen separate from this region. Remote small infarct right globus pallidus/ med  corona radiata. Remote small infarct anterior and posterior aspect of the left corona radiata. Chronic microvascular changes. MRA HEAD No large vessel occlusion. Mild branch vessel atherosclerotic changes as noted above. Electronically Signed   By: Genia Del M.D.   On: 01/31/2016 09:45   US Carotid Bilateral  Result Date: 01/31/2016 CLINICAL DATA:  CVA. EXAM: BILATERAL CAROTID DUPLEX ULTRASOUND TECHNIQUE: Pearline Cables scale imaging, color Doppler and duplex ultrasound were performed of bilateral carotid and vertebral arteries in the neck. COMPARISON:  01/31/2016. FINDINGS: Criteria: Quantification of carotid stenosis is based on velocity parameters that correlate the residual internal carotid diameter with NASCET-based stenosis levels, using the diameter of the distal internal carotid lumen as the denominator for stenosis measurement. The following velocity measurements were obtained: RIGHT ICA:  72/23 cm/sec CCA:  0000000 cm/sec SYSTOLIC ICA/CCA RATIO:  0.9 DIASTOLIC ICA/CCA RATIO:  1.2 ECA:  123 cm/sec LEFT ICA:  66/14 cm/sec CCA:  99991111 cm/sec SYSTOLIC ICA/CCA RATIO:  0.9 DIASTOLIC ICA/CCA RATIO:  0.8 ECA:  90 cm/sec RIGHT CAROTID ARTERY: Mild atherosclerotic plaque right carotid bifurcation. No flow limiting stenosis. RIGHT VERTEBRAL ARTERY:  Patent with antegrade flow. LEFT CAROTID ARTERY: Mild atherosclerotic plaque left carotid bifurcation. LEFT VERTEBRAL ARTERY:  Patent with antegrade flow. IMPRESSION: 1. Mild bilateral carotid bifurcation atherosclerotic vascular plaque. No flow limiting stenosis. Degree of stenosis less than 50% bilaterally. 2.  Vertebrals are patent with antegrade flow. Electronically Signed   By: Marcello Moores  Register   On: 01/31/2016 10:46   Mr Jodene Nam Head/brain Wo Cm  Result Date: 01/31/2016 CLINICAL DATA:  76 year old diabetic hypertensive male with 3 episodes of slurred speech this past month. Subsequent encounter. EXAM: MRI HEAD WITHOUT CONTRAST MRA HEAD WITHOUT CONTRAST TECHNIQUE:  Multiplanar, multiecho pulse sequences of the brain and surrounding structures were obtained without intravenous contrast. Angiographic images of the head were obtained using MRA technique without contrast. COMPARISON:  01/30/2016 head CT.  11/19/2013 brain MR. FINDINGS: MRI HEAD FINDINGS Brain: Left parietal subcortical white matter hyperintensity appears larger than on the prior exam and may represent combination of result of chronic microvascular  changes and small subacute infarct contributing to T2 shine through on diffusion sequence. No acute infarct is seen separate from this region. Remote small infarct right globus pallidus/ med corona radiata. Remote small infarct anterior and posterior aspect of the left corona radiata. Chronic microvascular changes. No intracranial hemorrhage. No intracranial mass lesion noted on this unenhanced exam. Mild global atrophy without hydrocephalus. Focal calcification right aspect of the anterior falx unchanged. Vascular: Major intracranial vascular structures are patent. Please see below. Skull and upper cervical spine: Mild degenerative changes upper cervical spine. Cervicomedullary junction within normal limits. Sinuses/Orbits: Exophthalmos.  Normal symmetric extraocular muscles. Polypoid opacification superior aspect left maxillary sinus. Other: Negative. MRA HEAD FINDINGS Anterior circulation without medium or large size vessel significant stenosis or occlusion. Fetal contribution to the right posterior cerebral artery. Mild branch vessel irregularity middle cerebral artery bilaterally and A2 segment left anterior cerebral artery. Mild irregularity without significant stenosis of the distal vertebral arteries or basilar artery. Small caliber right posterior inferior cerebellar artery. Nonvisualized anterior inferior cerebellar artery bilaterally. Only the proximal aspect of the right superior cerebellar artery is visualized. Mild narrowing and irregularity of the distal  branches of the posterior cerebral artery bilaterally. No aneurysm or vascular malformation noted. IMPRESSION: MRI HEAD Left parietal subcortical white matter hyperintensity appears larger than on the prior exam and may represent combination of result of chronic microvascular changes and small subacute infarct contributing to T2 shine through on diffusion sequence. No acute infarct is seen separate from this region. Remote small infarct right globus pallidus/ med corona radiata. Remote small infarct anterior and posterior aspect of the left corona radiata. Chronic microvascular changes. MRA HEAD No large vessel occlusion. Mild branch vessel atherosclerotic changes as noted above. Electronically Signed   By: Genia Del M.D.   On: 01/31/2016 09:45   Ct Head Code Stroke W/o Cm  Result Date: 01/30/2016 CLINICAL DATA:  Code stroke. New onset slurred speech beginning 1.5 hours ago. Difficulty speaking. Headache. EXAM: CT HEAD WITHOUT CONTRAST TECHNIQUE: Contiguous axial images were obtained from the base of the skull through the vertex without intravenous contrast. COMPARISON:  MRI brain 11/19/2013.  CTA of the neck 01/04/2014. FINDINGS: Brain: No acute cortical infarct is present. Scattered white matter changes are again seen. A lacunar infarct in the right internal capsule was not present on the prior study, but appears remote. The brainstem and cerebellum are within normal limits. The ventricles are proportionate to the degree of atrophy. No significant extra-axial fluid collection is present. Vascular: No hyperdense vessel or unexpected calcification. Skull: Normal. Negative for fracture or focal lesion. Sinuses/Orbits: The paranasal sinuses and the mastoid air cells are clear. ASPECTS [facility] Stroke Program Early CT Score) - Ganglionic level infarction (caudate, lentiform nuclei, internal capsule, insula, M1-M3 cortex): 7/7 - Supraganglionic infarction (M4-M6 cortex): 3/3 Total score (0-10 with 10 being  normal): 10/10 IMPRESSION: 1. No acute intracranial abnormality. 2. Mild atrophy and white matter disease is again seen. This likely reflects the sequela of chronic microvascular ischemia. 3. Lacunar infarct within the right internal capsule was not present on the prior study, but appears remote. 4. ASPECTS is 10/10 These results were called by telephone at the time of interpretation on 01/30/2016 at 4:17 pm to Dr. Harvest Dark , who verbally acknowledged these results. Electronically Signed   By: San Morelle M.D.   On: 01/30/2016 16:18     ASSESSMENT AND PLAN:   * Subacute left parietal infarct with expressive aphasia Recurrent symptoms again Discussed with Dr.  Reynolds. - MRI/Echo and carotids done - aspirin and statin. Added plavix - Lovenox for DVT prophylaxis. - Neuro checks every 4 hours for 24 hours.  All the records are reviewed and case discussed with Care Management/Social Workerr. Management plans discussed with the patient, family and they are in agreement.  CODE STATUS: FULL CODE  DVT Prophylaxis: SCDs  TOTAL TIME TAKING CARE OF THIS PATIENT: 30 minutes.   POSSIBLE D/C IN 1-2 DAYS, DEPENDING ON CLINICAL CONDITION.  Hillary Bow R M.D on 02/01/2016 at 3:29 PM  Between 7am to 6pm - Pager - [phone]  After 6pm go to [email or web address] - password [email or web address] [facility]  Office  [phone]  CC: Primary care physician; Juluis Pitch, MD  Note: This dictation was prepared with Dragon dictation along with smaller phrase technology. Any transcriptional errors that result from this process are unintentional.

## 2016-02-01 NOTE — Progress Notes (Signed)
SLP Cancellation Note  Patient Details Name: Taylor Austin MRN: [id number] DOB: Mar 17, 1940   Cancelled treatment:       Reason Eval/Treat Not Completed: SLP screened, no needs identified, will sign off (chart reviewed; consulted both NSG and pt/wife). Pt and wife indicated no further speech deficits; pt conversed at conversational level w/ no apparent cognitive-linguistic deficits. Pt did indicate infrequent coughing w/ meals. Briefly discussed possible reasons for dysphagia including behaviors (talking, eating quickly, etc), reflux, and declined pulmonary status/chronic issues including COPD. Wife and pt indicated he does "talk a lot" and that "that could probably be it". Pt is followed by a Pulmonologist at home as well. As pt stated he did not need further assessment of this "right now", encouraged pt to more carefully monitor any episodes of dysphagia and f/u w/ his primary MD for further f/u that could include an objective swallow study. Pt and wife agreed. Gave few handouts on aspiration and reflux precautions. NSG updated on above.    Orinda Kenner, MS, CCC-SLP Nassir Neidert 02/01/2016, 10:09 AM

## 2016-02-01 NOTE — Progress Notes (Addendum)
Subjective: Patient stable this morning and doing well.  This afternoon had an episode of difficulty with speech, facial droop that cleared after a few minutes.    Objective: Current vital signs: BP (!) 154/82 (BP Location: Right Arm)   Pulse 73   Temp 97.9 F (36.6 C) (Oral)   Resp 18   Ht _0  (1.676 m)   Wt 122.5 kg (270 lb)   SpO2 97%   BMI 43.58 kg/m  Vital signs in last 24 hours: Temp:  [97.9 F (36.6 C)-98.2 F (36.8 C)] 97.9 F (36.6 C) (01/23 2047) Pulse Rate:  [73-102] 73 (01/24 0434) Resp:  [18-22] 18 (01/24 0434) BP: (152-156)/(74-87) 154/82 (01/24 0434) SpO2:  [94 %-97 %] 97 % (01/24 0434)  Intake/Output from previous day: 01/23 0701 - 01/24 0700 In: 475 [P.O.:475] Out: 625 [Urine:625] Intake/Output this shift: Total I/O In: 240 [P.O.:240] Out: -  Nutritional status: Diet Carb Modified Fluid consistency: Thin; Room service appropriate? Yes  Neurologic Exam: Mental Status: Alert, oriented, thought content appropriate.  Speech fluent without evidence of aphasia.  Able to follow 3 step commands without difficulty. Cranial Nerves: II: Discs flat bilaterally; Visual fields grossly normal, pupils equal, round, reactive to light and accommodation III,IV, VI: ptosis not present, extra-ocular motions intact bilaterally V,VII: smile symmetric, facial light touch sensation normal bilaterally VIII: hearing normal bilaterally IX,X: gag reflex present XI: bilateral shoulder shrug XII: midline tongue extension Motor: Patient able to lift all extremities against gravity.     Lab Results: Basic Metabolic Panel:  Recent Labs Lab 01/30/16 1608  NA 139  K 4.5  CL 101  CO2 30  GLUCOSE 124*  BUN 20  CREATININE 0.94  CALCIUM 9.7    Liver Function Tests:  Recent Labs Lab 01/30/16 1608  AST 25  ALT 21  ALKPHOS 50  BILITOT 0.5  PROT 7.1  ALBUMIN 4.2   No results for input(s): LIPASE, AMYLASE in the last 168 hours. No results for input(s): AMMONIA in  the last 168 hours.  CBC:  Recent Labs Lab 01/30/16 1608  WBC 6.8  NEUTROABS 4.4  HGB 14.6  HCT 42.6  MCV 92.5  PLT 167    Cardiac Enzymes:  Recent Labs Lab 01/30/16 1608  TROPONINI <0.03    Lipid Panel:  Recent Labs Lab 01/31/16 0448  CHOL 170  TRIG 372*  HDL 37*  CHOLHDL 4.6  VLDL 74*  LDLCALC 59    CBG:  Recent Labs Lab 01/31/16 1138 01/31/16 1718 01/31/16 2114 02/01/16 0716 02/01/16 1134  GLUCAP 144* 97 118* 153* 141*    Microbiology: Results for orders placed or performed in visit on 03/01/15  Microscopic Examination     Status: Abnormal   Collection Time: 03/01/15  2:20 PM  Result Value Ref Range Status   WBC, UA 0-5 0 - 5 /hpf Final   RBC, UA 0-2 0 - 2 /hpf Final   Epithelial Cells (non renal) None seen 0 - 10 /hpf Final   Casts Present (A) None seen /lpf Final   Cast Type Hyaline casts N/A Final   Mucus, UA Present (A) Not Estab. Final   Bacteria, UA None seen None seen/Few Final    Coagulation Studies:  Recent Labs  01/30/16 1608  LABPROT 12.1  INR 0.90    Imaging: Mr Brain Wo Contrast  Result Date: 01/31/2016 CLINICAL DATA:  76 year old diabetic hypertensive male with 3 episodes of slurred speech this past month. Subsequent encounter. EXAM: MRI HEAD WITHOUT CONTRAST MRA HEAD  WITHOUT CONTRAST TECHNIQUE: Multiplanar, multiecho pulse sequences of the brain and surrounding structures were obtained without intravenous contrast. Angiographic images of the head were obtained using MRA technique without contrast. COMPARISON:  01/30/2016 head CT.  11/19/2013 brain MR. FINDINGS: MRI HEAD FINDINGS Brain: Left parietal subcortical white matter hyperintensity appears larger than on the prior exam and may represent combination of result of chronic microvascular changes and small subacute infarct contributing to T2 shine through on diffusion sequence. No acute infarct is seen separate from this region. Remote small infarct right globus pallidus/  med corona radiata. Remote small infarct anterior and posterior aspect of the left corona radiata. Chronic microvascular changes. No intracranial hemorrhage. No intracranial mass lesion noted on this unenhanced exam. Mild global atrophy without hydrocephalus. Focal calcification right aspect of the anterior falx unchanged. Vascular: Major intracranial vascular structures are patent. Please see below. Skull and upper cervical spine: Mild degenerative changes upper cervical spine. Cervicomedullary junction within normal limits. Sinuses/Orbits: Exophthalmos.  Normal symmetric extraocular muscles. Polypoid opacification superior aspect left maxillary sinus. Other: Negative. MRA HEAD FINDINGS Anterior circulation without medium or large size vessel significant stenosis or occlusion. Fetal contribution to the right posterior cerebral artery. Mild branch vessel irregularity middle cerebral artery bilaterally and A2 segment left anterior cerebral artery. Mild irregularity without significant stenosis of the distal vertebral arteries or basilar artery. Small caliber right posterior inferior cerebellar artery. Nonvisualized anterior inferior cerebellar artery bilaterally. Only the proximal aspect of the right superior cerebellar artery is visualized. Mild narrowing and irregularity of the distal branches of the posterior cerebral artery bilaterally. No aneurysm or vascular malformation noted. IMPRESSION: MRI HEAD Left parietal subcortical white matter hyperintensity appears larger than on the prior exam and may represent combination of result of chronic microvascular changes and small subacute infarct contributing to T2 shine through on diffusion sequence. No acute infarct is seen separate from this region. Remote small infarct right globus pallidus/ med corona radiata. Remote small infarct anterior and posterior aspect of the left corona radiata. Chronic microvascular changes. MRA HEAD No large vessel occlusion. Mild branch  vessel atherosclerotic changes as noted above. Electronically Signed   By: Genia Del M.D.   On: 01/31/2016 09:45   US Carotid Bilateral  Result Date: 01/31/2016 CLINICAL DATA:  CVA. EXAM: BILATERAL CAROTID DUPLEX ULTRASOUND TECHNIQUE: Pearline Cables scale imaging, color Doppler and duplex ultrasound were performed of bilateral carotid and vertebral arteries in the neck. COMPARISON:  01/31/2016. FINDINGS: Criteria: Quantification of carotid stenosis is based on velocity parameters that correlate the residual internal carotid diameter with NASCET-based stenosis levels, using the diameter of the distal internal carotid lumen as the denominator for stenosis measurement. The following velocity measurements were obtained: RIGHT ICA:  72/23 cm/sec CCA:  98/33 cm/sec SYSTOLIC ICA/CCA RATIO:  0.9 DIASTOLIC ICA/CCA RATIO:  1.2 ECA:  123 cm/sec LEFT ICA:  66/14 cm/sec CCA:  82/50 cm/sec SYSTOLIC ICA/CCA RATIO:  0.9 DIASTOLIC ICA/CCA RATIO:  0.8 ECA:  90 cm/sec RIGHT CAROTID ARTERY: Mild atherosclerotic plaque right carotid bifurcation. No flow limiting stenosis. RIGHT VERTEBRAL ARTERY:  Patent with antegrade flow. LEFT CAROTID ARTERY: Mild atherosclerotic plaque left carotid bifurcation. LEFT VERTEBRAL ARTERY:  Patent with antegrade flow. IMPRESSION: 1. Mild bilateral carotid bifurcation atherosclerotic vascular plaque. No flow limiting stenosis. Degree of stenosis less than 50% bilaterally. 2.  Vertebrals are patent with antegrade flow. Electronically Signed   By: Marcello Moores  Register   On: 01/31/2016 10:46   Mr Jodene Nam Head/brain Wo Cm  Result Date: 01/31/2016 CLINICAL DATA:  76 year old diabetic hypertensive male with 3 episodes of slurred speech this past month. Subsequent encounter. EXAM: MRI HEAD WITHOUT CONTRAST MRA HEAD WITHOUT CONTRAST TECHNIQUE: Multiplanar, multiecho pulse sequences of the brain and surrounding structures were obtained without intravenous contrast. Angiographic images of the head were obtained using MRA  technique without contrast. COMPARISON:  01/30/2016 head CT.  11/19/2013 brain MR. FINDINGS: MRI HEAD FINDINGS Brain: Left parietal subcortical white matter hyperintensity appears larger than on the prior exam and may represent combination of result of chronic microvascular changes and small subacute infarct contributing to T2 shine through on diffusion sequence. No acute infarct is seen separate from this region. Remote small infarct right globus pallidus/ med corona radiata. Remote small infarct anterior and posterior aspect of the left corona radiata. Chronic microvascular changes. No intracranial hemorrhage. No intracranial mass lesion noted on this unenhanced exam. Mild global atrophy without hydrocephalus. Focal calcification right aspect of the anterior falx unchanged. Vascular: Major intracranial vascular structures are patent. Please see below. Skull and upper cervical spine: Mild degenerative changes upper cervical spine. Cervicomedullary junction within normal limits. Sinuses/Orbits: Exophthalmos.  Normal symmetric extraocular muscles. Polypoid opacification superior aspect left maxillary sinus. Other: Negative. MRA HEAD FINDINGS Anterior circulation without medium or large size vessel significant stenosis or occlusion. Fetal contribution to the right posterior cerebral artery. Mild branch vessel irregularity middle cerebral artery bilaterally and A2 segment left anterior cerebral artery. Mild irregularity without significant stenosis of the distal vertebral arteries or basilar artery. Small caliber right posterior inferior cerebellar artery. Nonvisualized anterior inferior cerebellar artery bilaterally. Only the proximal aspect of the right superior cerebellar artery is visualized. Mild narrowing and irregularity of the distal branches of the posterior cerebral artery bilaterally. No aneurysm or vascular malformation noted. IMPRESSION: MRI HEAD Left parietal subcortical white matter hyperintensity  appears larger than on the prior exam and may represent combination of result of chronic microvascular changes and small subacute infarct contributing to T2 shine through on diffusion sequence. No acute infarct is seen separate from this region. Remote small infarct right globus pallidus/ med corona radiata. Remote small infarct anterior and posterior aspect of the left corona radiata. Chronic microvascular changes. MRA HEAD No large vessel occlusion. Mild branch vessel atherosclerotic changes as noted above. Electronically Signed   By: Genia Del M.D.   On: 01/31/2016 09:45   Ct Head Code Stroke W/o Cm  Result Date: 01/30/2016 CLINICAL DATA:  Code stroke. New onset slurred speech beginning 1.5 hours ago. Difficulty speaking. Headache. EXAM: CT HEAD WITHOUT CONTRAST TECHNIQUE: Contiguous axial images were obtained from the base of the skull through the vertex without intravenous contrast. COMPARISON:  MRI brain 11/19/2013.  CTA of the neck 01/04/2014. FINDINGS: Brain: No acute cortical infarct is present. Scattered white matter changes are again seen. A lacunar infarct in the right internal capsule was not present on the prior study, but appears remote. The brainstem and cerebellum are within normal limits. The ventricles are proportionate to the degree of atrophy. No significant extra-axial fluid collection is present. Vascular: No hyperdense vessel or unexpected calcification. Skull: Normal. Negative for fracture or focal lesion. Sinuses/Orbits: The paranasal sinuses and the mastoid air cells are clear. ASPECTS [facility] Stroke Program Early CT Score) - Ganglionic level infarction (caudate, lentiform nuclei, internal capsule, insula, M1-M3 cortex): 7/7 - Supraganglionic infarction (M4-M6 cortex): 3/3 Total score (0-10 with 10 being normal): 10/10 IMPRESSION: 1. No acute intracranial abnormality. 2. Mild atrophy and white matter disease is again seen. This likely reflects the sequela of  chronic microvascular  ischemia. 3. Lacunar infarct within the right internal capsule was not present on the prior study, but appears remote. 4. ASPECTS is 10/10 These results were called by telephone at the time of interpretation on 01/30/2016 at 4:17 pm to Dr. Harvest Dark , who verbally acknowledged these results. Electronically Signed   By: San Morelle M.D.   On: 01/30/2016 16:18    Medications:  I have reviewed the patient's current medications. Scheduled: . aspirin EC  81 mg Oral Daily  . clopidogrel  75 mg Oral Daily  . enoxaparin (LOVENOX) injection  40 mg Subcutaneous Q12H  . finasteride  5 mg Oral Daily  . glyBURIDE  5 mg Oral BID WC   And  . metFORMIN  500 mg Oral BID WC  . insulin aspart  0-15 Units Subcutaneous TID WC  . pioglitazone  45 mg Oral Daily  . simvastatin  40 mg Oral q1800  . umeclidinium-vilanterol  1 puff Inhalation QHS    Assessment/Plan: Patient with another episode this afternoon.  Now back to baseline.  EEG unremarkable.  Has only received two doses of Plavix.    Recommendations: 1.  Continue ASA and Plavix 2.  Frequent neuro checks 3.  Telemetry to be reviewed 4.  Liberal BP control  5.  ESR, B12, folate, RPR, TSH   LOS: 2 days   Alexis Goodell, MD Neurology [phone] 02/01/2016  2:10 PM

## 2016-02-01 NOTE — Plan of Care (Signed)
Problem: Education: Goal: Knowledge of disease or condition will improve Outcome: Progressing VSS, free of falls during shift.  Denies pain.  Neuro checks negative, NIH 0.  Call bell within reach, [place].

## 2016-02-01 NOTE — Plan of Care (Signed)
Wife came out and said patient was having slurred speech and difficulty finding words again, also had drooping on R side of mouth.  Did grip strengths and he had good strength in lower extremities.  Right hand was slightly weaker.  Main thing noted was he had difficulty following instructions.  Texted Dr. Darvin Neighbours these results and he said he'd call Dr. Doy Mince.  When I went in the room to let them know Dr. Doy Mince would follow up, pt said he felt like his speech was improved.  Dr. Doy Mince came in the room to evaluate when I was in there.

## 2016-02-02 LAB — RPR: RPR Ser Ql: NONREACTIVE

## 2016-02-02 LAB — CREATININE, SERUM
Creatinine, Ser: 1.05 mg/dL (ref 0.61–1.24)
GFR calc Af Amer: >60 mL/min (ref >60)
GFR calc non Af Amer: >60 mL/min (ref >60)

## 2016-02-02 LAB — CBC
HEMATOCRIT: 40.1 % (ref 40.0–52.0)
Hemoglobin: 14.1 g/dL (ref 13.0–18.0)
MCH: 32.2 pg (ref 26.0–34.0)
MCHC: 35.2 g/dL (ref 32.0–36.0)
MCV: 91.4 fL (ref 80.0–100.0)
Platelets: 158 10*3/uL (ref 150–440)
RBC: 4.38 MIL/uL — AB (ref 4.40–5.90)
RDW: 13.9 % (ref 11.5–14.5)
WBC: 6.4 10*3/uL (ref 3.8–10.6)

## 2016-02-02 LAB — GLUCOSE, CAPILLARY
GLUCOSE-CAPILLARY: 107 mg/dL — AB (ref 65–99)
Glucose-Capillary: 127 mg/dL — ABNORMAL HIGH (ref 65–99)

## 2016-02-02 NOTE — Progress Notes (Signed)
Subjective: Patient did well overnight with no further events.    Objective: Current vital signs: BP 127/62   Pulse 94   Temp 98 F (36.7 C) (Oral)   Resp 20   Ht 5\' 6"  (1.676 m)   Wt 122.5 kg (270 lb)   SpO2 96%   BMI 43.58 kg/m  Vital signs in last 24 hours: Temp:  [97.6 F (36.4 C)-98.3 F (36.8 C)] 98 F (36.7 C) (01/25 0855) Pulse Rate:  [67-94] 94 (01/25 0855) Resp:  [17-20] 20 (01/25 0857) BP: (127-157)/(62-86) 127/62 (01/25 0855) SpO2:  [94 %-99 %] 96 % (01/25 0855)  Intake/Output from previous day: 01/24 0701 - 01/25 0700 In: 720 [P.O.:720] Out: -  Intake/Output this shift: Total I/O In: 240 [P.O.:240] Out: -  Nutritional status: Diet Carb Modified Fluid consistency: Thin; Room service appropriate? Yes  Neurologic Exam: Mental Status: Alert, oriented, thought content appropriate.  Speech fluent without evidence of aphasia.  Able to follow 3 step commands without difficulty. Cranial Nerves: II: Discs flat bilaterally; Visual fields grossly normal, pupils equal, round, reactive to light and accommodation III,IV, VI: ptosis not present, extra-ocular motions intact bilaterally V,VII: smile symmetric, facial light touch sensation normal bilaterally VIII: hearing normal bilaterally IX,X: gag reflex present XI: bilateral shoulder shrug XII: midline tongue extension Motor: Patient able to lift all extremities against gravity.     Lab Results: Basic Metabolic Panel:  Recent Labs Lab 01/30/16 1608 02/02/16 0512  NA 139  --   K 4.5  --   CL 101  --   CO2 30  --   GLUCOSE 124*  --   BUN 20  --   CREATININE 0.94 1.05  CALCIUM 9.7  --     Liver Function Tests:  Recent Labs Lab 01/30/16 1608  AST 25  ALT 21  ALKPHOS 50  BILITOT 0.5  PROT 7.1  ALBUMIN 4.2   No results for input(s): LIPASE, AMYLASE in the last 168 hours. No results for input(s): AMMONIA in the last 168 hours.  CBC:  Recent Labs Lab 01/30/16 1608 02/02/16 0512  WBC 6.8  6.4  NEUTROABS 4.4  --   HGB 14.6 14.1  HCT 42.6 40.1  MCV 92.5 91.4  PLT 167 158    Cardiac Enzymes:  Recent Labs Lab 01/30/16 1608  TROPONINI <0.03    Lipid Panel:  Recent Labs Lab 01/31/16 0448  CHOL 170  TRIG 372*  HDL 37*  CHOLHDL 4.6  VLDL 74*  LDLCALC 59    CBG:  Recent Labs Lab 01/31/16 2114 02/01/16 0716 02/01/16 1134 02/01/16 1652 02/01/16 2032  GLUCAP 118* 153* 141* 174* 169*    Microbiology: Results for orders placed or performed in visit on 03/01/15  Microscopic Examination     Status: Abnormal   Collection Time: 03/01/15  2:20 PM  Result Value Ref Range Status   WBC, UA 0-5 0 - 5 /hpf Final   RBC, UA 0-2 0 - 2 /hpf Final   Epithelial Cells (non renal) None seen 0 - 10 /hpf Final   Casts Present (A) None seen /lpf Final   Cast Type Hyaline casts N/A Final   Mucus, UA Present (A) Not Estab. Final   Bacteria, UA None seen None seen/Few Final    Coagulation Studies:  Recent Labs  01/30/16 1608  LABPROT 12.1  INR 0.90    Imaging: No results found.  Medications:  I have reviewed the patient's current medications. Scheduled: . aspirin EC  81 mg Oral  Daily  . clopidogrel  75 mg Oral Daily  . enoxaparin (LOVENOX) injection  40 mg Subcutaneous Q12H  . finasteride  5 mg Oral Daily  . glyBURIDE  5 mg Oral BID WC   And  . metFORMIN  500 mg Oral BID WC  . insulin aspart  0-15 Units Subcutaneous TID WC  . pioglitazone  45 mg Oral Daily  . simvastatin  40 mg Oral q1800  . umeclidinium-vilanterol  1 puff Inhalation QHS    Assessment/Plan: Patient did well overnight.  On Plavix and ASA.    Recommendations: 1.  OOB today 2.  Continue Plavix and ASA.  If further events consider AED therapy     LOS: 3 days   Alexis Goodell, MD Neurology [phone] 02/02/2016  10:43 AM

## 2016-02-02 NOTE — Plan of Care (Signed)
Problem: Education: Goal: Knowledge of disease or condition will improve Outcome: Progressing VSS, free of falls during shift.  Reported continued HA pain 6-7/10.  Improved w/ one-time dose IV Toradol, PRN Excedrin Migraine x1.  No other complaints overnight.  Pt removed PIV, stated he hit hand against bed rail.  Dr. Marcille Blanco paged, no need for new PIV.  Tele d/c'ed, NSR.  NIH 0, no aphasia noted.  Neuro checks stable, benign.  Wife at bedside, call bell within reach.  WCTM.

## 2016-02-02 NOTE — Progress Notes (Signed)
Pt for discharge home. Alert. No resp distress. No deficits noted with speech. Discharge instructions discussed with pt and spouse.  meds discussed/ diet / activity  And  F/u discussed. Verbalized understanding of  Discharge plans. Home with wife via w/c at this time w/o c/o

## 2016-02-02 NOTE — Progress Notes (Addendum)
Occupational Therapy Treatment Patient Details Name: YOHANN SCHOENIG MRN: [id number] DOB: Nov 08, 1940 Today's Date: 02/02/2016    History of present illness Pt. is a 76 y.o. male who was admitted to [facility] with slurred speech. Pt. PMHx includes: HTN, DM Type 2, Hyperlipidemia, and overactive Bladder.   OT comments  Pt. and wife report pt. had a TIA yesterday, and was kept in the hospital overnight. Pt. was able to demonstrate proper A/E use for LE ADLs with verbal cues, visual cues, and visual demonstration. Pt. Continues to benefit from skilled OT services to review A/E use, and home set-up, DME needs. Pt. is planning to return home with wife. No follow up services are indicated upon discharge.    Follow Up Recommendations  No OT follow up    Equipment Recommendations   (Reacher, sockaid)    Recommendations for Other Services      Precautions / Restrictions Precautions Precautions: Fall Restrictions Weight Bearing Restrictions: No       Mobility Bed Mobility Overal bed mobility: Independent                                                                         ADL                       Lower Body Dressing: Supervision/safety                 General ADL Comments: Pt. and wife education about A?E use for LE ADLs. Home set-up was reviewed with pt., and wife.      Vision                     Perception     Praxis      Cognition   Behavior During Therapy: WFL for tasks assessed/performed Overall Cognitive Status: Within Functional Limits for tasks assessed                       Extremity/Trunk Assessment               Exercises     Shoulder Instructions       General Comments      Pertinent Vitals/ Pain       Pain Assessment: No/denies pain Pain Score: 0-No pain  Home Living                                          Prior Functioning/Environment               Frequency  Min 1X/week        Progress Toward Goals  OT Goals(current goals can now be found in the care plan section)  Progress towards OT goals: Progressing toward goals  Acute Rehab OT Goals Patient Stated Goal: To return home OT Goal Formulation: With patient/family Potential to Achieve Goals: Good  Plan      Co-evaluation                 End of Session     Activity Tolerance Patient tolerated treatment well   Patient  Left in bed;with call bell/phone within reach;with bed alarm set   Nurse Communication          Time: 1010-1033 OT Time Calculation (min): 23 min  Charges: OT General Charges $OT Visit: 1 Procedure OT Evaluation $OT Eval Low Complexity: 1 Procedure OT Treatments $Self Care/Home Management : 23-37 mins  Harrel Carina, MS, OTR/L 02/02/2016, 12:15 PM

## 2016-02-06 NOTE — Discharge Summary (Signed)
[facility] [place] NAME: Taylor Austin    MR#:  [id number]  DATE OF BIRTH:  12/28/1940  DATE OF ADMISSION:  01/30/2016 ADMITTING PHYSICIAN: Epifanio Lesches, MD  DATE OF DISCHARGE: 02/02/2016  4:27 PM  PRIMARY CARE PHYSICIAN: Juluis Pitch, MD   ADMISSION DIAGNOSIS:  Acute cerebrovascular accident (CVA) ([place]) [I63.9] Cerebrovascular accident (CVA), unspecified mechanism ([place]) [I63.9] Cerebral infarction, unspecified mechanism ([place]) [I63.9]  DISCHARGE DIAGNOSIS:  Active Problems:   Acute cerebrovascular accident (CVA) ([place])   SECONDARY DIAGNOSIS:   Past Medical History:  Diagnosis Date  . Allergy   . Arthritis   . Back pain   . Barrett esophagus   . BPH (benign prostatic hyperplasia)   . Chronic kidney disease   . Coronary artery disease   . Depression   . Diabetes mellitus without complication ([place])   . Diverticulitis   . Dysphagia   . Esophageal reflux   . GERD (gastroesophageal reflux disease)   . Hyperlipidemia   . Hypersomnia   . Hypertension   . Kidney stones   . Low testosterone   . OAB (overactive bladder)   . Rheumatic fever   . Sleep apnea   . Vitamin B12 deficiency      ADMITTING HISTORY  HISTORY OF PRESENT ILLNESS:  Taylor Austin  is a 76 y.o. male with a known history of hypertension, diabetes mellitus type 2, hyperlipidemia, overactive bladder comes in because of slurred speech, difficulty getting the words out since yesterday. Patient says that he had the at least 3 episodes of slurred speech the in this month. Went to primary doctor Dr. Lovie Macadamia, patient had ultrasound of carotids done last week but the results are not available. No weakness of hands or legs, no numbness or tingling in extremities or face. His main complaint is only slurred speech and expressive aphasia. Speech difficulties started around 3 PM today, by the time he came to emergency room he did have a speech difficulty but symptoms  resolved with time. Lasted about 15-20 minutes only.    HOSPITAL COURSE:   *  Subacute left parietal infarct with expressive aphasia MRI confirmed subacute stroke. MRA showed no acute vascular abnormalities.  Patient was taking aspirin at home and started on Plavix. Statin.  He did have recurrent symptoms in the hospital which was thought to be likely evolving stroke. Discussed Dr. Doy Mince on day of discharge. Patient is back to baseline. If he continues to have recurrent symptoms he will need anticonvulsant therapy after discussing with neurology in spite of having a normal EEG.  Patient worked with physical therapy and home health has been set up.  Follow up with PCP and neurology as outpatient.  Stable for discharge home.  CONSULTS OBTAINED:  Treatment Team:  Alexis Goodell, MD Catarina Hartshorn, MD  DRUG ALLERGIES:   Allergies  Allergen Reactions  . Hydrocodone Shortness Of Breath  . Oxycodone-Acetaminophen Shortness Of Breath    DISCHARGE MEDICATIONS:   Discharge Medication List as of 02/02/2016  4:00 PM    START taking these medications   Details  clopidogrel (PLAVIX) 75 MG tablet Take 1 tablet (75 mg total) by mouth daily., Starting Wed 02/01/2016, Normal      CONTINUE these medications which have NOT CHANGED   Details  aspirin EC 81 MG tablet Take 81 mg by mouth daily., Historical Med    Cyanocobalamin (VITAMIN B-12 PO) Take by mouth., Historical Med    finasteride (PROSCAR) 5 MG tablet Take 1  tablet (5 mg total) by mouth daily., Starting Mon 11/21/2015, Normal    glyBURIDE-metformin (GLUCOVANCE) 5-500 MG per tablet TAKE ONE TABLET BY MOUTH TWICE DAILY WITH MEALS, Historical Med    magnesium oxide (MAG-OX) 400 MG tablet Take 400 mg by mouth daily., Until Discontinued, Historical Med    Multiple Vitamin (MULTI-VITAMINS) TABS Take 1 tablet by mouth daily. , Historical Med    oxymetazoline (AFRIN) 0.05 % nasal spray Place 1 spray into both nostrils 2 (two)  times daily., Historical Med    pioglitazone (ACTOS) 45 MG tablet Take 45 mg by mouth daily. , Starting Fri 04/16/2014, Historical Med    Probiotic Product (PROBIOTIC PO) Take by mouth., Historical Med    simvastatin (ZOCOR) 40 MG tablet Take 40 mg by mouth daily at 6 PM. , Starting Fri 07/02/2014, Historical Med    umeclidinium-vilanterol (ANORO ELLIPTA) 62.5-25 MCG/INH AEPB Inhale 1 puff into the lungs daily., Historical Med    mirabegron ER (MYRBETRIQ) 50 MG TB24 tablet Take 1 tablet (50 mg total) by mouth daily., Starting Tue 03/01/2015, Normal        Today   VITAL SIGNS:  Blood pressure (!) 151/80, pulse 87, temperature 98.8 F (37.1 C), temperature source Oral, resp. rate 18, height 5\' 6"  (1.676 m), weight 122.5 kg (270 lb), SpO2 99 %.  I/O:  No intake or output data in the 24 hours ending 02/06/16 1644  PHYSICAL EXAMINATION:  Physical Exam  GENERAL:  76 y.o.-year-old patient lying in the bed with no acute distress.  LUNGS: Normal breath sounds bilaterally, no wheezing, rales,rhonchi or crepitation. No use of accessory muscles of respiration.  CARDIOVASCULAR: S1, S2 normal. No murmurs, rubs, or gallops.  ABDOMEN: Soft, non-tender, non-distended. Bowel sounds present. No organomegaly or mass.  NEUROLOGIC: Moves all 4 extremities. PSYCHIATRIC: The patient is alert and oriented x 3.  SKIN: No obvious rash, lesion, or ulcer.   DATA REVIEW:   CBC  Recent Labs Lab 02/02/16 0512  WBC 6.4  HGB 14.1  HCT 40.1  PLT 158    Chemistries   Recent Labs Lab 02/02/16 0512  CREATININE 1.05    Cardiac Enzymes No results for input(s): TROPONINI in the last 168 hours.  Microbiology Results  Results for orders placed or performed in visit on 03/01/15  Microscopic Examination     Status: Abnormal   Collection Time: 03/01/15  2:20 PM  Result Value Ref Range Status   WBC, UA 0-5 0 - 5 /hpf Final   RBC, UA 0-2 0 - 2 /hpf Final   Epithelial Cells (non renal) None seen 0 - 10  /hpf Final   Casts Present (A) None seen /lpf Final   Cast Type Hyaline casts N/A Final   Mucus, UA Present (A) Not Estab. Final   Bacteria, UA None seen None seen/Few Final    RADIOLOGY:  No results found.  Follow up with PCP in 1 week.  Management plans discussed with the patient, family and they are in agreement.  CODE STATUS:  Code Status History    Date Active Date Inactive Code Status Order ID Comments User Context   01/30/2016  6:23 PM 02/02/2016  7:32 PM Full Code [id number]  Epifanio Lesches, MD ED   11/17/2014  8:33 AM 11/17/2014 12:54 PM Full Code [id number]  Teodoro Spray, MD Inpatient   08/11/2014 10:00 AM 08/11/2014  4:04 PM Full Code [id number]  Teodoro Spray, MD Inpatient      TOTAL TIME TAKING CARE OF THIS  PATIENT ON DAY OF DISCHARGE: more than 30 minutes.   Hillary Bow R M.D on 02/06/2016 at 4:44 PM  Between 7am to 6pm - Pager - [phone]  After 6pm go to [email or web address] - password [email or web address] [facility]  Office  [phone]  CC: Primary care physician; Juluis Pitch, MD  Note: This dictation was prepared with Dragon dictation along with smaller phrase technology. Any transcriptional errors that result from this process are unintentional.

## 2016-03-28 ENCOUNTER — Encounter: Payer: Self-pay | Admitting: *Deleted

## 2016-03-29 NOTE — Discharge Instructions (Signed)
Cataract Surgery, Care After °Refer to this sheet in the next few weeks. These instructions provide you with information about caring for yourself after your procedure. Your health care provider may also give you more specific instructions. Your treatment has been planned according to current medical practices, but problems sometimes occur. Call your health care provider if you have any problems or questions after your procedure. °What can I expect after the procedure? °After the procedure, it is common to have: °· Itching. °· Discomfort. °· Fluid discharge. °· Sensitivity to light and to touch. °· Bruising. °Follow these instructions at home: °Eye Care  °· Check your eye every day for signs of infection. Watch for: °¨ Redness, swelling, or pain. °¨ Fluid, blood, or pus. °¨ Warmth. °¨ Bad smell. °Activity  °· Avoid strenuous activities, such as playing contact sports, for as long as told by your health care provider. °· Do not drive or operate heavy machinery until your health care provider approves. °· Do not bend or lift heavy objects . Bending increases pressure in the eye. You can walk, climb stairs, and do light household chores. °· Ask your health care provider when you can return to work. If you work in a dusty environment, you may be advised to wear protective eyewear for a period of time. °General instructions  °· Take or apply over-the-counter and prescription medicines only as told by your health care provider. This includes eye drops. °· Do not touch or rub your eyes. °· If you were given a protective shield, wear it as told by your health care provider. If you were not given a protective shield, wear sunglasses as told by your health care provider to protect your eyes. °· Keep the area around your eye clean and dry. Avoid swimming or allowing water to hit you directly in the face while showering until told by your health care provider. Keep soap and shampoo out of your eyes. °· Do not put a contact lens  into the affected eye or eyes until your health care provider approves. °· Keep all follow-up visits as told by your health care provider. This is important. °Contact a health care provider if: ° °· You have increased bruising around your eye. °· You have pain that is not helped with medicine. °· You have a fever. °· You have redness, swelling, or pain in your eye. °· You have fluid, blood, or pus coming from your incision. °· Your vision gets worse. °Get help right away if: °· You have sudden vision loss. °This information is not intended to replace advice given to you by your health care provider. Make sure you discuss any questions you have with your health care provider. °Document Released: 07/14/2004 Document Revised: 05/05/2015 Document Reviewed: 11/04/2014 °Elsevier Interactive Patient Education © 2017 [employer] ° ° ° ° °General Anesthesia, Adult, Care After °These instructions provide you with information about caring for yourself after your procedure. Your health care provider may also give you more specific instructions. Your treatment has been planned according to current medical practices, but problems sometimes occur. Call your health care provider if you have any problems or questions after your procedure. °What can I expect after the procedure? °After the procedure, it is common to have: °· Vomiting. °· A sore throat. °· Mental slowness. °It is common to feel: °· Nauseous. °· Cold or shivery. °· Sleepy. °· Tired. °· Sore or achy, even in parts of your body where you did not have surgery. °Follow these instructions at   home: °For at least 24 hours after the procedure:  °· Do not: °¨ Participate in activities where you could fall or become injured. °¨ Drive. °¨ Use heavy machinery. °¨ Drink alcohol. °¨ Take sleeping pills or medicines that cause drowsiness. °¨ Make important decisions or sign legal documents. °¨ Take care of children on your own. °· Rest. °Eating and drinking  °· If you vomit, drink  water, juice, or soup when you can drink without vomiting. °· Drink enough fluid to keep your urine clear or pale yellow. °· Make sure you have little or no nausea before eating solid foods. °· Follow the diet recommended by your health care provider. °General instructions  °· Have a responsible adult stay with you until you are awake and alert. °· Return to your normal activities as told by your health care provider. Ask your health care provider what activities are safe for you. °· Take over-the-counter and prescription medicines only as told by your health care provider. °· If you smoke, do not smoke without supervision. °· Keep all follow-up visits as told by your health care provider. This is important. °Contact a health care provider if: °· You continue to have nausea or vomiting at home, and medicines are not helpful. °· You cannot drink fluids or start eating again. °· You cannot urinate after 8-12 hours. °· You develop a skin rash. °· You have fever. °· You have increasing redness at the site of your procedure. °Get help right away if: °· You have difficulty breathing. °· You have chest pain. °· You have unexpected bleeding. °· You feel that you are having a life-threatening or urgent problem. °This information is not intended to replace advice given to you by your health care provider. Make sure you discuss any questions you have with your health care provider. °Document Released: 04/02/2000 Document Revised: 05/30/2015 Document Reviewed: 12/09/2014 °Elsevier Interactive Patient Education © 2017 [employer] ° °

## 2016-04-03 ENCOUNTER — Ambulatory Visit
Admission: RE | Admit: 2016-04-03 | Discharge: 2016-04-03 | Disposition: A | Discharge status: Home / Self Care | Payer: Medicare Other | Source: Ambulatory Visit | Attending: Ophthalmology | Admitting: Ophthalmology

## 2016-04-03 ENCOUNTER — Ambulatory Visit: Payer: Medicare Other | Admitting: Anesthesiology

## 2016-04-03 ENCOUNTER — Encounter
Admission: RE | Disposition: A | Discharge status: Home / Self Care | Payer: Self-pay | Source: Ambulatory Visit | Attending: Ophthalmology

## 2016-04-03 DIAGNOSIS — I10 Essential (primary) hypertension: Secondary | ICD-10-CM | POA: Insufficient documentation

## 2016-04-03 DIAGNOSIS — Z8673 Personal history of transient ischemic attack (TIA), and cerebral infarction without residual deficits: Secondary | ICD-10-CM | POA: Diagnosis not present

## 2016-04-03 DIAGNOSIS — Z7984 Long term (current) use of oral hypoglycemic drugs: Secondary | ICD-10-CM | POA: Diagnosis not present

## 2016-04-03 DIAGNOSIS — I2510 Atherosclerotic heart disease of native coronary artery without angina pectoris: Secondary | ICD-10-CM | POA: Diagnosis not present

## 2016-04-03 DIAGNOSIS — F419 Anxiety disorder, unspecified: Secondary | ICD-10-CM | POA: Insufficient documentation

## 2016-04-03 DIAGNOSIS — G709 Myoneural disorder, unspecified: Secondary | ICD-10-CM | POA: Diagnosis not present

## 2016-04-03 DIAGNOSIS — K219 Gastro-esophageal reflux disease without esophagitis: Secondary | ICD-10-CM | POA: Insufficient documentation

## 2016-04-03 DIAGNOSIS — Z87891 Personal history of nicotine dependence: Secondary | ICD-10-CM | POA: Insufficient documentation

## 2016-04-03 DIAGNOSIS — E1136 Type 2 diabetes mellitus with diabetic cataract: Secondary | ICD-10-CM | POA: Diagnosis present

## 2016-04-03 DIAGNOSIS — Z79899 Other long term (current) drug therapy: Secondary | ICD-10-CM | POA: Insufficient documentation

## 2016-04-03 DIAGNOSIS — G4730 Sleep apnea, unspecified: Secondary | ICD-10-CM | POA: Diagnosis not present

## 2016-04-03 HISTORY — DX: Cerebral infarction, unspecified: I63.9

## 2016-04-03 HISTORY — PX: CATARACT EXTRACTION W/PHACO: SHX586

## 2016-04-03 HISTORY — DX: Dizziness and giddiness: R42

## 2016-04-03 HISTORY — DX: Presence of dental prosthetic device (complete) (partial): Z97.2

## 2016-04-03 LAB — GLUCOSE, CAPILLARY
GLUCOSE-CAPILLARY: 142 mg/dL — AB (ref 65–99)
Glucose-Capillary: 139 mg/dL — ABNORMAL HIGH (ref 65–99)

## 2016-04-03 SURGERY — PHACOEMULSIFICATION, CATARACT, WITH IOL INSERTION
Anesthesia: Monitor Anesthesia Care | Site: Eye | Laterality: Left | Wound class: Clean

## 2016-04-03 MED ORDER — FENTANYL CITRATE (PF) 100 MCG/2ML IJ SOLN
INTRAMUSCULAR | Status: DC | PRN
  Administered 2016-04-03: 100 ug via INTRAVENOUS

## 2016-04-03 MED ORDER — LIDOCAINE HCL (PF) 2 % IJ SOLN
INTRAOCULAR | Status: DC | PRN
  Administered 2016-04-03: 1 mL via INTRAOCULAR

## 2016-04-03 MED ORDER — MOXIFLOXACIN HCL 0.5 % OP SOLN
OPHTHALMIC | Status: DC | PRN
  Administered 2016-04-03: .1 mL via OPHTHALMIC

## 2016-04-03 MED ORDER — EPINEPHRINE PF 1 MG/ML IJ SOLN
INTRAOCULAR | Status: DC | PRN
  Administered 2016-04-03: 146 mL via OPHTHALMIC

## 2016-04-03 MED ORDER — ARMC OPHTHALMIC DILATING DROPS
1.0000 "application " | OPHTHALMIC | Status: DC | PRN
  Administered 2016-04-03 (×3): 1 via OPHTHALMIC

## 2016-04-03 MED ORDER — SODIUM HYALURONATE 23 MG/ML IO SOLN
INTRAOCULAR | Status: DC | PRN
  Administered 2016-04-03: 0.6 mL via INTRAOCULAR

## 2016-04-03 MED ORDER — SODIUM HYALURONATE 10 MG/ML IO SOLN
INTRAOCULAR | Status: DC | PRN
  Administered 2016-04-03: 0.55 mL via INTRAOCULAR

## 2016-04-03 MED ORDER — MIDAZOLAM HCL 2 MG/2ML IJ SOLN
INTRAMUSCULAR | Status: DC | PRN
  Administered 2016-04-03: 2 mg via INTRAVENOUS

## 2016-04-03 SURGICAL SUPPLY — 17 items
CANNULA ANT/CHMB 27GA (MISCELLANEOUS) ×2 IMPLANT
CUP MEDICINE 2OZ PLAST GRAD ST (MISCELLANEOUS) ×2 IMPLANT
DISSECTOR HYDRO NUCLEUS 50X22 (MISCELLANEOUS) ×2 IMPLANT
GLOVE BIO SURGEON STRL SZ8 (GLOVE) ×2 IMPLANT
GLOVE SURG LX 7.5 STRW (GLOVE) ×1
GLOVE SURG LX STRL 7.5 STRW (GLOVE) ×1 IMPLANT
GOWN STRL REUS W/ TWL LRG LVL3 (GOWN DISPOSABLE) ×2 IMPLANT
GOWN STRL REUS W/TWL LRG LVL3 (GOWN DISPOSABLE) ×2
LENS IOL TECNIS ITEC 22.0 (Intraocular Lens) ×2 IMPLANT
MARKER SKIN DUAL TIP RULER LAB (MISCELLANEOUS) ×2 IMPLANT
PACK CATARACT (MISCELLANEOUS) ×2 IMPLANT
PACK CATARACT BRASINGTON (MISCELLANEOUS) ×2 IMPLANT
PACK EYE AFTER SURG (MISCELLANEOUS) ×2 IMPLANT
SYR 3ML LL SCALE MARK (SYRINGE) ×2 IMPLANT
SYR TB 1ML LUER SLIP (SYRINGE) ×2 IMPLANT
WATER STERILE IRR 250ML POUR (IV SOLUTION) ×2 IMPLANT
WIPE NON LINTING 3.25X3.25 (MISCELLANEOUS) ×2 IMPLANT

## 2016-04-03 NOTE — Transfer of Care (Signed)
Immediate Anesthesia Transfer of Care Note  Patient: Taylor Austin  Procedure(s) Performed: Procedure(s) with comments: CATARACT EXTRACTION PHACO AND INTRAOCULAR LENS PLACEMENT ([facility]) left diabetic (Left) - diabetic - oral meds sleep apnea  Patient Location: PACU  Anesthesia Type: MAC  Level of Consciousness: awake, alert  and patient cooperative  Airway and Oxygen Therapy: Patient Spontanous Breathing and Patient connected to supplemental oxygen  Post-op Assessment: Post-op Vital signs reviewed, Patient's Cardiovascular Status Stable, Respiratory Function Stable, Patent Airway and No signs of Nausea or vomiting  Post-op Vital Signs: Reviewed and stable  Complications: No apparent anesthesia complications

## 2016-04-03 NOTE — Anesthesia Postprocedure Evaluation (Signed)
Anesthesia Post Note  Patient: Taylor Austin  Procedure(s) Performed: Procedure(s) (LRB): CATARACT EXTRACTION PHACO AND INTRAOCULAR LENS PLACEMENT ([place]) left diabetic (Left)  Patient location during evaluation: PACU Anesthesia Type: MAC Level of consciousness: awake and alert and oriented Pain management: pain level controlled Vital Signs Assessment: post-procedure vital signs reviewed and stable Respiratory status: spontaneous breathing and nonlabored ventilation Cardiovascular status: stable Postop Assessment: no signs of nausea or vomiting and adequate PO intake Anesthetic complications: no    Estill Batten

## 2016-04-03 NOTE — Anesthesia Preprocedure Evaluation (Signed)
Anesthesia Evaluation  Patient identified by MRN, date of birth, ID band Patient awake    Reviewed: Allergy & Precautions, NPO status , Patient's Chart, lab work & pertinent test results  Airway Mallampati: II  TM Distance: >3 FB Neck ROM: Full    Dental no notable dental hx.    Pulmonary sleep apnea , former smoker,    Pulmonary exam normal        Cardiovascular hypertension, Pt. on medications + CAD  Normal cardiovascular exam     Neuro/Psych PSYCHIATRIC DISORDERS Depression  Neuromuscular disease CVA    GI/Hepatic GERD  Controlled,  Endo/Other  diabetes, Type 2, Oral Hypoglycemic Agents  Renal/GU CRFRenal disease     Musculoskeletal   Abdominal   Peds  Hematology   Anesthesia Other Findings   Reproductive/Obstetrics                             Anesthesia Physical Anesthesia Plan  ASA: III  Anesthesia Plan: MAC   Post-op Pain Management:    Induction: Intravenous  Airway Management Planned:   Additional Equipment:   Intra-op Plan:   Post-operative Plan:   Informed Consent: I have reviewed the patients History and Physical, chart, labs and discussed the procedure including the risks, benefits and alternatives for the proposed anesthesia with the patient or authorized representative who has indicated his/her understanding and acceptance.     Plan Discussed with: CRNA  Anesthesia Plan Comments:         Anesthesia Quick Evaluation

## 2016-04-03 NOTE — Anesthesia Procedure Notes (Signed)
Procedure Name: MAC Performed by: Mayme Genta Pre-anesthesia Checklist: Patient identified, Emergency Drugs available, Suction available, Timeout performed and Patient being monitored Patient Re-evaluated:Patient Re-evaluated prior to inductionOxygen Delivery Method: Nasal cannula Placement Confirmation: positive ETCO2

## 2016-04-03 NOTE — Op Note (Signed)
OPERATIVE NOTE  Taylor Austin [id number] 04/03/2016   PREOPERATIVE DIAGNOSIS:  Nuclear sclerotic cataract left eye.  H25.12   POSTOPERATIVE DIAGNOSIS:    Nuclear sclerotic cataract left eye.     PROCEDURE:  Phacoemusification with posterior chamber intraocular lens placement of the left eye   LENS:   Implant Name Type Inv. Item Serial No. Manufacturer Lot No. LRB No. Used  LENS IOL DIOP 22.0 - [id number] Intraocular Lens LENS IOL DIOP 22.0 [id number] AMO   Left 1       PCB00 +22.0   ULTRASOUND TIME: 1 minutes 18 seconds.  CDE 11.47   SURGEON:  Benay Pillow, MD, MPH   ANESTHESIA:  Topical with tetracaine drops augmented with 1% preservative-free intracameral lidocaine.  ESTIMATED BLOOD LOSS: <1 mL   COMPLICATIONS:  None.   DESCRIPTION OF PROCEDURE:  The patient was identified in the holding room and transported to the operating room and placed in the supine position under the operating microscope.  The left eye was identified as the operative eye and it was prepped and draped in the usual sterile ophthalmic fashion.   A 1.0 millimeter clear-corneal paracentesis was made at the 5:00 position. 0.5 ml of preservative-free 1% lidocaine with epinephrine was injected into the anterior chamber.  The anterior chamber was filled with Healon 5 viscoelastic.  A 2.4 millimeter keratome was used to make a near-clear corneal incision at the 2:00 position.  A curvilinear capsulorrhexis was made with a cystotome and capsulorrhexis forceps.  Balanced salt solution was used to hydrodissect and hydrodelineate the nucleus.   Phacoemulsification was then used in stop and chop fashion to remove the lens nucleus and epinucleus.  The remaining cortex was then removed using the irrigation and aspiration handpiece. Healon was then placed into the capsular bag to distend it for lens placement.  A lens was then injected into the capsular bag.  The remaining viscoelastic was aspirated.   Wounds were  hydrated with balanced salt solution.  The anterior chamber was inflated to a physiologic pressure with balanced salt solution.  Intracameral vigamox 0.1 mL undiltued was injected into the eye and a drop placed onto the ocular surface.  No wound leaks were noted.  The patient was taken to the recovery room in stable condition without complications of anesthesia or surgery  Benay Pillow 04/03/2016, 8:08 AM

## 2016-04-03 NOTE — H&P (Signed)
The History and Physical notes are on paper, have been signed, and are to be scanned.   I have examined the patient and there are no changes to the H&P.   Benay Pillow 04/03/2016 7:07 AM

## 2016-04-04 ENCOUNTER — Encounter: Payer: Self-pay | Admitting: Ophthalmology

## 2016-04-24 ENCOUNTER — Ambulatory Visit: Payer: Medicare Other | Attending: Neurology

## 2016-04-24 DIAGNOSIS — Z9181 History of falling: Secondary | ICD-10-CM | POA: Insufficient documentation

## 2016-04-24 DIAGNOSIS — R262 Difficulty in walking, not elsewhere classified: Secondary | ICD-10-CM | POA: Insufficient documentation
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
## 2016-04-26 ENCOUNTER — Encounter: Payer: Self-pay | Admitting: *Deleted

## 2016-04-26 ENCOUNTER — Ambulatory Visit: Payer: Medicare Other

## 2016-04-26 DIAGNOSIS — R262 Difficulty in walking, not elsewhere classified: Secondary | ICD-10-CM

## 2016-04-26 DIAGNOSIS — Z9181 History of falling: Secondary | ICD-10-CM
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
## 2016-04-27 NOTE — Discharge Instructions (Signed)
Cataract Surgery, Care After °Refer to this sheet in the next few weeks. These instructions provide you with information about caring for yourself after your procedure. Your health care provider may also give you more specific instructions. Your treatment has been planned according to current medical practices, but problems sometimes occur. Call your health care provider if you have any problems or questions after your procedure. °What can I expect after the procedure? °After the procedure, it is common to have: °· Itching. °· Discomfort. °· Fluid discharge. °· Sensitivity to light and to touch. °· Bruising. °Follow these instructions at home: °Eye Care  °· Check your eye every day for signs of infection. Watch for: °¨ Redness, swelling, or pain. °¨ Fluid, blood, or pus. °¨ Warmth. °¨ Bad smell. °Activity  °· Avoid strenuous activities, such as playing contact sports, for as long as told by your health care provider. °· Do not drive or operate heavy machinery until your health care provider approves. °· Do not bend or lift heavy objects . Bending increases pressure in the eye. You can walk, climb stairs, and do light household chores. °· Ask your health care provider when you can return to work. If you work in a dusty environment, you may be advised to wear protective eyewear for a period of time. °General instructions  °· Take or apply over-the-counter and prescription medicines only as told by your health care provider. This includes eye drops. °· Do not touch or rub your eyes. °· If you were given a protective shield, wear it as told by your health care provider. If you were not given a protective shield, wear sunglasses as told by your health care provider to protect your eyes. °· Keep the area around your eye clean and dry. Avoid swimming or allowing water to hit you directly in the face while showering until told by your health care provider. Keep soap and shampoo out of your eyes. °· Do not put a contact lens  into the affected eye or eyes until your health care provider approves. °· Keep all follow-up visits as told by your health care provider. This is important. °Contact a health care provider if: ° °· You have increased bruising around your eye. °· You have pain that is not helped with medicine. °· You have a fever. °· You have redness, swelling, or pain in your eye. °· You have fluid, blood, or pus coming from your incision. °· Your vision gets worse. °Get help right away if: °· You have sudden vision loss. °This information is not intended to replace advice given to you by your health care provider. Make sure you discuss any questions you have with your health care provider. °Document Released: 07/14/2004 Document Revised: 05/05/2015 Document Reviewed: 11/04/2014 °Elsevier Interactive Patient Education © 2017 [employer] ° ° ° ° °General Anesthesia, Adult, Care After °These instructions provide you with information about caring for yourself after your procedure. Your health care provider may also give you more specific instructions. Your treatment has been planned according to current medical practices, but problems sometimes occur. Call your health care provider if you have any problems or questions after your procedure. °What can I expect after the procedure? °After the procedure, it is common to have: °· Vomiting. °· A sore throat. °· Mental slowness. °It is common to feel: °· Nauseous. °· Cold or shivery. °· Sleepy. °· Tired. °· Sore or achy, even in parts of your body where you did not have surgery. °Follow these instructions at   home: °For at least 24 hours after the procedure:  °· Do not: °¨ Participate in activities where you could fall or become injured. °¨ Drive. °¨ Use heavy machinery. °¨ Drink alcohol. °¨ Take sleeping pills or medicines that cause drowsiness. °¨ Make important decisions or sign legal documents. °¨ Take care of children on your own. °· Rest. °Eating and drinking  °· If you vomit, drink  water, juice, or soup when you can drink without vomiting. °· Drink enough fluid to keep your urine clear or pale yellow. °· Make sure you have little or no nausea before eating solid foods. °· Follow the diet recommended by your health care provider. °General instructions  °· Have a responsible adult stay with you until you are awake and alert. °· Return to your normal activities as told by your health care provider. Ask your health care provider what activities are safe for you. °· Take over-the-counter and prescription medicines only as told by your health care provider. °· If you smoke, do not smoke without supervision. °· Keep all follow-up visits as told by your health care provider. This is important. °Contact a health care provider if: °· You continue to have nausea or vomiting at home, and medicines are not helpful. °· You cannot drink fluids or start eating again. °· You cannot urinate after 8-12 hours. °· You develop a skin rash. °· You have fever. °· You have increasing redness at the site of your procedure. °Get help right away if: °· You have difficulty breathing. °· You have chest pain. °· You have unexpected bleeding. °· You feel that you are having a life-threatening or urgent problem. °This information is not intended to replace advice given to you by your health care provider. Make sure you discuss any questions you have with your health care provider. °Document Released: 04/02/2000 Document Revised: 05/30/2015 Document Reviewed: 12/09/2014 °Elsevier Interactive Patient Education © 2017 [employer] ° °

## 2016-05-01 ENCOUNTER — Ambulatory Visit
Admission: RE | Admit: 2016-05-01 | Discharge: 2016-05-01 | Disposition: A | Discharge status: Home / Self Care | Payer: Medicare Other | Source: Ambulatory Visit | Attending: Ophthalmology | Admitting: Ophthalmology

## 2016-05-01 ENCOUNTER — Ambulatory Visit: Payer: Medicare Other | Admitting: Anesthesiology

## 2016-05-01 ENCOUNTER — Encounter
Admission: RE | Disposition: A | Discharge status: Home / Self Care | Payer: Self-pay | Source: Ambulatory Visit | Attending: Ophthalmology

## 2016-05-01 ENCOUNTER — Ambulatory Visit: Payer: Medicare Other

## 2016-05-01 DIAGNOSIS — F329 Major depressive disorder, single episode, unspecified: Secondary | ICD-10-CM | POA: Insufficient documentation

## 2016-05-01 DIAGNOSIS — Z8673 Personal history of transient ischemic attack (TIA), and cerebral infarction without residual deficits: Secondary | ICD-10-CM | POA: Diagnosis not present

## 2016-05-01 DIAGNOSIS — G4730 Sleep apnea, unspecified: Secondary | ICD-10-CM | POA: Diagnosis not present

## 2016-05-01 DIAGNOSIS — Z87891 Personal history of nicotine dependence: Secondary | ICD-10-CM | POA: Insufficient documentation

## 2016-05-01 DIAGNOSIS — I2510 Atherosclerotic heart disease of native coronary artery without angina pectoris: Secondary | ICD-10-CM | POA: Insufficient documentation

## 2016-05-01 DIAGNOSIS — I10 Essential (primary) hypertension: Secondary | ICD-10-CM | POA: Insufficient documentation

## 2016-05-01 DIAGNOSIS — G709 Myoneural disorder, unspecified: Secondary | ICD-10-CM | POA: Diagnosis not present

## 2016-05-01 DIAGNOSIS — Z79899 Other long term (current) drug therapy: Secondary | ICD-10-CM | POA: Insufficient documentation

## 2016-05-01 DIAGNOSIS — E1136 Type 2 diabetes mellitus with diabetic cataract: Secondary | ICD-10-CM | POA: Diagnosis present

## 2016-05-01 DIAGNOSIS — Z7984 Long term (current) use of oral hypoglycemic drugs: Secondary | ICD-10-CM | POA: Insufficient documentation

## 2016-05-01 DIAGNOSIS — K219 Gastro-esophageal reflux disease without esophagitis: Secondary | ICD-10-CM | POA: Insufficient documentation

## 2016-05-01 HISTORY — PX: CATARACT EXTRACTION W/PHACO: SHX586

## 2016-05-01 LAB — GLUCOSE, CAPILLARY
Glucose-Capillary: 116 mg/dL — ABNORMAL HIGH (ref 65–99)
Glucose-Capillary: 146 mg/dL — ABNORMAL HIGH (ref 65–99)

## 2016-05-01 SURGERY — PHACOEMULSIFICATION, CATARACT, WITH IOL INSERTION
Anesthesia: Monitor Anesthesia Care | Site: Eye | Laterality: Right | Wound class: Clean

## 2016-05-01 MED ORDER — EPINEPHRINE PF 1 MG/ML IJ SOLN
INTRAOCULAR | Status: DC | PRN
  Administered 2016-05-01: 91 mL via OPHTHALMIC

## 2016-05-01 MED ORDER — MOXIFLOXACIN HCL 0.5 % OP SOLN
OPHTHALMIC | Status: DC | PRN
  Administered 2016-05-01: 0.2 mL via OPHTHALMIC

## 2016-05-01 MED ORDER — FENTANYL CITRATE (PF) 100 MCG/2ML IJ SOLN
INTRAMUSCULAR | Status: DC | PRN
  Administered 2016-05-01 (×2): 50 ug via INTRAVENOUS

## 2016-05-01 MED ORDER — MIDAZOLAM HCL 2 MG/2ML IJ SOLN
INTRAMUSCULAR | Status: DC | PRN
  Administered 2016-05-01: 2 mg via INTRAVENOUS

## 2016-05-01 MED ORDER — ARMC OPHTHALMIC DILATING DROPS
1.0000 "application " | OPHTHALMIC | Status: DC | PRN
  Administered 2016-05-01 (×3): 1 via OPHTHALMIC

## 2016-05-01 MED ORDER — LACTATED RINGERS IV SOLN: INTRAVENOUS | Status: DC

## 2016-05-01 MED ORDER — SODIUM HYALURONATE 10 MG/ML IO SOLN
INTRAOCULAR | Status: DC | PRN
  Administered 2016-05-01: 0.55 mL via INTRAOCULAR

## 2016-05-01 MED ORDER — SODIUM HYALURONATE 23 MG/ML IO SOLN
INTRAOCULAR | Status: DC | PRN
  Administered 2016-05-01: 0.6 mL via INTRAOCULAR

## 2016-05-01 MED ORDER — LIDOCAINE HCL (PF) 2 % IJ SOLN
INTRAOCULAR | Status: DC | PRN
  Administered 2016-05-01: 2 mL via INTRAOCULAR

## 2016-05-01 SURGICAL SUPPLY — 16 items

## 2016-05-01 NOTE — Anesthesia Preprocedure Evaluation (Signed)
Anesthesia Evaluation  Patient identified by MRN, date of birth, ID band Patient awake    Reviewed: Allergy & Precautions, NPO status , Patient's Chart, lab work & pertinent test results  Airway Mallampati: II  TM Distance: >3 FB Neck ROM: Full    Dental no notable dental hx.    Pulmonary sleep apnea , former smoker,    Pulmonary exam normal        Cardiovascular hypertension, Pt. on medications + CAD  Normal cardiovascular exam     Neuro/Psych PSYCHIATRIC DISORDERS Depression  Neuromuscular disease CVA    GI/Hepatic GERD  Controlled,  Endo/Other  diabetes, Type 2, Oral Hypoglycemic Agents  Renal/GU CRFRenal disease     Musculoskeletal   Abdominal   Peds  Hematology   Anesthesia Other Findings   Reproductive/Obstetrics                             Anesthesia Physical  Anesthesia Plan  ASA: III  Anesthesia Plan: MAC   Post-op Pain Management:    Induction: Intravenous  Airway Management Planned:   Additional Equipment:   Intra-op Plan:   Post-operative Plan:   Informed Consent: I have reviewed the patients History and Physical, chart, labs and discussed the procedure including the risks, benefits and alternatives for the proposed anesthesia with the patient or authorized representative who has indicated his/her understanding and acceptance.     Plan Discussed with: CRNA  Anesthesia Plan Comments:         Anesthesia Quick Evaluation

## 2016-05-01 NOTE — H&P (Signed)
The History and Physical notes are on paper, have been signed, and are to be scanned.   I have examined the patient and there are no changes to the H&P.   Taylor Austin 05/01/2016 7:28 AM

## 2016-05-01 NOTE — Anesthesia Procedure Notes (Signed)
Performed by: Emonii Wienke Pre-anesthesia Checklist: Patient identified, Emergency Drugs available, Suction available, Timeout performed and Patient being monitored Patient Re-evaluated:Patient Re-evaluated prior to inductionOxygen Delivery Method: Circle system utilized Preoxygenation: Pre-oxygenation with 100% oxygen Intubation Type: Inhalational induction Ventilation: Mask ventilation without difficulty and Mask ventilation throughout procedure Dental Injury: Teeth and Oropharynx as per pre-operative assessment        

## 2016-05-01 NOTE — Anesthesia Postprocedure Evaluation (Signed)
Anesthesia Post Note  Patient: Taylor Austin  Procedure(s) Performed: Procedure(s) (LRB): CATARACT EXTRACTION PHACO AND INTRAOCULAR LENS PLACEMENT (IOC) Right diabetic (Right)  Patient location during evaluation: PACU Anesthesia Type: MAC Level of consciousness: awake and alert Pain management: pain level controlled Vital Signs Assessment: post-procedure vital signs reviewed and stable Respiratory status: spontaneous breathing, nonlabored ventilation, respiratory function stable and patient connected to nasal cannula oxygen Cardiovascular status: stable and blood pressure returned to baseline Anesthetic complications: no    Marshell Levan

## 2016-05-01 NOTE — Op Note (Signed)
OPERATIVE NOTE  CORON ROSSANO [id number] 05/01/2016   PREOPERATIVE DIAGNOSIS:  Nuclear sclerotic cataract right eye.  H25.11   POSTOPERATIVE DIAGNOSIS:    Nuclear sclerotic cataract right eye.     PROCEDURE:  Phacoemusification with posterior chamber intraocular lens placement of the right eye   LENS:   Implant Name Type Inv. Item Serial No. Manufacturer Lot No. LRB No. Used  LENS IOL DIOP 21.5 - [id number] Intraocular Lens LENS IOL DIOP 21.5 [id number] AMO   Right 1       PCB00 +21.5   ULTRASOUND TIME: 0 minutes 28 seconds.  CDE 4.19   SURGEON:  Benay Pillow, MD, MPH  ANESTHESIOLOGIST: Anesthesiologist: Idelia Salm, MD CRNA: Londell Moh, CRNA   ANESTHESIA:  Topical with tetracaine drops augmented with 1% preservative-free intracameral lidocaine.  ESTIMATED BLOOD LOSS: less than 1 mL.   COMPLICATIONS:  None.   DESCRIPTION OF PROCEDURE:  The patient was identified in the holding room and transported to the operating room and placed in the supine position under the operating microscope.  The right eye was identified as the operative eye and it was prepped and draped in the usual sterile ophthalmic fashion.   A 1.0 millimeter clear-corneal paracentesis was made at the 10:30 position. 0.5 ml of preservative-free 1% lidocaine with epinephrine was injected into the anterior chamber.  The anterior chamber was filled with Healon 5 viscoelastic.  A 2.4 millimeter keratome was used to make a near-clear corneal incision at the 8:00 position.  A curvilinear capsulorrhexis was made with a cystotome and capsulorrhexis forceps.  Balanced salt solution was used to hydrodissect and hydrodelineate the nucleus.   Phacoemulsification was then used in stop and chop fashion to remove the lens nucleus and epinucleus.  The remaining cortex was then removed using the irrigation and aspiration handpiece. Healon was then placed into the capsular bag to distend it for lens placement.  A lens was then  injected into the capsular bag.  The remaining viscoelastic was aspirated.  There was some minimal emulsate noted in the posterior segment, within normal limits.   Wounds were hydrated with balanced salt solution.  The anterior chamber was inflated to a physiologic pressure with balanced salt solution.   Intracameral vigamox 0.1 mL undiluted was injected into the eye and a drop placed onto the ocular surface.  No wound leaks were noted.  The patient was taken to the recovery room in stable condition without complications of anesthesia or surgery  Benay Pillow 05/01/2016, 8:04 AM

## 2016-05-01 NOTE — Transfer of Care (Signed)
Immediate Anesthesia Transfer of Care Note  Patient: Taylor Austin  Procedure(s) Performed: Procedure(s) with comments: CATARACT EXTRACTION PHACO AND INTRAOCULAR LENS PLACEMENT (IOC) Right diabetic (Right) - Diabetic oral meds sleep apnea  Patient Location: PACU  Anesthesia Type: MAC  Level of Consciousness: awake, alert  and patient cooperative  Airway and Oxygen Therapy: Patient Spontanous Breathing and Patient connected to supplemental oxygen  Post-op Assessment: Post-op Vital signs reviewed, Patient's Cardiovascular Status Stable, Respiratory Function Stable, Patent Airway and No signs of Nausea or vomiting  Post-op Vital Signs: Reviewed and stable  Complications: No apparent anesthesia complications

## 2016-05-02 ENCOUNTER — Encounter: Payer: Self-pay | Admitting: Ophthalmology

## 2016-05-03 ENCOUNTER — Ambulatory Visit: Payer: Medicare Other

## 2016-05-08 ENCOUNTER — Ambulatory Visit: Payer: Medicare Other

## 2016-05-10 ENCOUNTER — Ambulatory Visit: Payer: Medicare Other | Attending: Neurology

## 2016-05-10 VITALS — BP 131/86 | HR 100

## 2016-05-10 DIAGNOSIS — R42 Dizziness and giddiness: Secondary | ICD-10-CM | POA: Insufficient documentation

## 2016-05-10 DIAGNOSIS — Z9181 History of falling: Secondary | ICD-10-CM | POA: Diagnosis present

## 2016-05-10 DIAGNOSIS — R262 Difficulty in walking, not elsewhere classified: Secondary | ICD-10-CM | POA: Insufficient documentation

## 2016-05-10 DIAGNOSIS — R2689 Other abnormalities of gait and mobility: Secondary | ICD-10-CM | POA: Insufficient documentation
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
## 2016-05-14 ENCOUNTER — Other Ambulatory Visit: Payer: Medicare Other

## 2016-05-14 DIAGNOSIS — R972 Elevated prostate specific antigen [PSA]: Secondary | ICD-10-CM

## 2016-05-15 ENCOUNTER — Telehealth: Payer: Self-pay

## 2016-05-15 ENCOUNTER — Ambulatory Visit: Payer: Medicare Other

## 2016-05-15 VITALS — BP 128/85 | HR 62

## 2016-05-15 DIAGNOSIS — N401 Enlarged prostate with lower urinary tract symptoms: Secondary | ICD-10-CM

## 2016-05-15 DIAGNOSIS — Z9181 History of falling: Secondary | ICD-10-CM

## 2016-05-15 DIAGNOSIS — R262 Difficulty in walking, not elsewhere classified: Secondary | ICD-10-CM | POA: Diagnosis not present

## 2016-05-15 DIAGNOSIS — R2689 Other abnormalities of gait and mobility: Secondary | ICD-10-CM

## 2016-05-15 LAB — PSA: PROSTATE SPECIFIC AG, SERUM: 2.6 ng/mL (ref 0.0–4.0)

## 2016-05-15 NOTE — Telephone Encounter (Signed)
Spoke with pt who stated he is taking his finasteride.

## 2016-05-15 NOTE — Telephone Encounter (Signed)
Spoke with pt wife in reference to PSA results and needing labs in 69mo. Wife voiced understanding. Lab appt made and orders placed.

## 2016-05-15 NOTE — Telephone Encounter (Signed)
His PSA is creeping up.  I would like it repeated in 3 months.
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
## 2016-05-15 NOTE — Telephone Encounter (Signed)
-----   Message from Nori Riis, PA-C sent at 05/15/2016  7:50 AM EDT ----- Is patient taking his finasteride?

## 2016-05-17 ENCOUNTER — Ambulatory Visit: Payer: Medicare Other | Admitting: Physical Therapy

## 2016-05-17 ENCOUNTER — Encounter: Payer: Self-pay | Admitting: Physical Therapy

## 2016-05-17 VITALS — BP 143/82

## 2016-05-17 DIAGNOSIS — R2689 Other abnormalities of gait and mobility: Secondary | ICD-10-CM

## 2016-05-17 DIAGNOSIS — R262 Difficulty in walking, not elsewhere classified: Secondary | ICD-10-CM

## 2016-05-17 DIAGNOSIS — Z9181 History of falling: Secondary | ICD-10-CM
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
## 2016-05-22 ENCOUNTER — Ambulatory Visit: Payer: Medicare Other | Admitting: Physical Therapy

## 2016-05-22 VITALS — BP 159/70

## 2016-05-22 DIAGNOSIS — R2689 Other abnormalities of gait and mobility: Secondary | ICD-10-CM

## 2016-05-22 DIAGNOSIS — R262 Difficulty in walking, not elsewhere classified: Secondary | ICD-10-CM

## 2016-05-22 DIAGNOSIS — Z9181 History of falling: Secondary | ICD-10-CM
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
## 2016-05-24 ENCOUNTER — Ambulatory Visit: Payer: Medicare Other

## 2016-05-24 ENCOUNTER — Ambulatory Visit: Payer: Medicare Other | Admitting: Physical Therapy

## 2016-05-24 VITALS — BP 91/54

## 2016-05-24 DIAGNOSIS — R2689 Other abnormalities of gait and mobility: Secondary | ICD-10-CM

## 2016-05-24 DIAGNOSIS — R262 Difficulty in walking, not elsewhere classified: Secondary | ICD-10-CM | POA: Diagnosis not present

## 2016-05-24 DIAGNOSIS — Z9181 History of falling: Secondary | ICD-10-CM
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
## 2016-05-28 ENCOUNTER — Emergency Department
Admission: EM | Admit: 2016-05-28 | Discharge: 2016-05-28 | Disposition: A | Discharge status: Home / Self Care | Payer: Medicare Other | Attending: Emergency Medicine | Admitting: Emergency Medicine

## 2016-05-28 ENCOUNTER — Encounter: Payer: Self-pay | Admitting: Emergency Medicine

## 2016-05-28 DIAGNOSIS — Z79899 Other long term (current) drug therapy: Secondary | ICD-10-CM | POA: Insufficient documentation

## 2016-05-28 DIAGNOSIS — M5432 Sciatica, left side: Secondary | ICD-10-CM | POA: Insufficient documentation

## 2016-05-28 DIAGNOSIS — Z87891 Personal history of nicotine dependence: Secondary | ICD-10-CM | POA: Diagnosis not present

## 2016-05-28 DIAGNOSIS — M5431 Sciatica, right side: Secondary | ICD-10-CM | POA: Diagnosis not present

## 2016-05-28 DIAGNOSIS — I129 Hypertensive chronic kidney disease with stage 1 through stage 4 chronic kidney disease, or unspecified chronic kidney disease: Secondary | ICD-10-CM | POA: Insufficient documentation

## 2016-05-28 DIAGNOSIS — N189 Chronic kidney disease, unspecified: Secondary | ICD-10-CM | POA: Diagnosis not present

## 2016-05-28 DIAGNOSIS — E1122 Type 2 diabetes mellitus with diabetic chronic kidney disease: Secondary | ICD-10-CM | POA: Insufficient documentation

## 2016-05-28 DIAGNOSIS — I2510 Atherosclerotic heart disease of native coronary artery without angina pectoris: Secondary | ICD-10-CM | POA: Diagnosis not present

## 2016-05-28 DIAGNOSIS — Z7984 Long term (current) use of oral hypoglycemic drugs: Secondary | ICD-10-CM | POA: Insufficient documentation

## 2016-05-28 DIAGNOSIS — R52 Pain, unspecified: Secondary | ICD-10-CM | POA: Diagnosis present

## 2016-05-28 MED ORDER — CYCLOBENZAPRINE HCL 5 MG PO TABS: 5.0000 mg | ORAL_TABLET | Freq: Three times a day (TID) | ORAL | 0 refills | Status: DC | PRN

## 2016-05-28 MED ORDER — PREDNISONE 10 MG PO TABS: ORAL_TABLET | ORAL | 0 refills | Status: DC

## 2016-05-28 NOTE — ED Triage Notes (Addendum)
Pt presents to ED c/o spasms , "jolts" form buttocks radiating to legs x6-8 weeks. Hx of spine surgery in 2006, spoke with Dr. Brigitte Pulse, neurologist , and advised to come in for possible imaging

## 2016-05-28 NOTE — ED Provider Notes (Signed)
[facility] Emergency Department Provider Note  ____________________________________________   I have reviewed the triage vital signs and the nursing notes.   HISTORY  Chief Complaint Muscle Pain and Sciatica    HPI Taylor Austin is a 76 y.o. male  who presents today complaining of shooting pains down his buttocks into his legs for 2 months. Patient has no numbness or weakness. He states that happens when he walks but not when he lies flat. No injury. No incontinence of bowel or bladder or change in any of that. He has had no back pain no fever. No recent surgery. Does have a remote history of surgery to his back. He was sent over here by evaluation for Dr. Manuella Ghazi, his neurologist. His neurologist however has not yet seen him for this problem.   The pain is "sharp lightening bolt-like" worse with ambulation, better with lying still, has not had anything that really helps it except for walking differently.   Past Medical History:  Diagnosis Date  . Allergy   . Arthritis   . Back pain   . Barrett esophagus   . BPH (benign prostatic hyperplasia)   . Chronic kidney disease   . Coronary artery disease   . Depression   . Diabetes mellitus without complication ([place])   . Diverticulitis   . Dysphagia   . Esophageal reflux   . GERD (gastroesophageal reflux disease)   . Hyperlipidemia   . Hypersomnia   . Hypertension   . Kidney stones   . Low testosterone   . OAB (overactive bladder)   . Presence of dental bridge    2 - top  . Rheumatic fever   . Sleep apnea    BiPAP  . Stroke ([place]) 01/2016  . Vertigo   . Vitamin B12 deficiency     Patient Active Problem List   Diagnosis Date Noted  . Acute cerebrovascular accident (CVA) ([place]) 01/30/2016  . Chronic kidney disease 07/14/2014  . Clinical depression 07/14/2014  . Gait instability 05/05/2014  . Arthritis of knee 07/03/2013  . Arteriosclerosis of coronary artery 07/03/2013  . Diabetes mellitus, type  2 ([place]) 07/03/2013  . Benign essential HTN 07/03/2013  . HLD (hyperlipidemia) 07/03/2013  . Breath shortness 07/03/2013  . Apnea, sleep 06/18/2013    Past Surgical History:  Procedure Laterality Date  . BACK SURGERY    . CARDIAC CATHETERIZATION  08/11/2014   Procedure: Left Heart Cath and Coronary Angiography;  Surgeon: Teodoro Spray, MD;  Location: [place] [facility];  Service: Cardiovascular;;  . CARDIAC CATHETERIZATION N/A 11/17/2014   Procedure: Right Heart Cath;  Surgeon: Teodoro Spray, MD;  Location: [place] [facility];  Service: Cardiovascular;  Laterality: N/A;  . CAROTID STENT    . CATARACT EXTRACTION W/PHACO Left 04/03/2016   Procedure: CATARACT EXTRACTION PHACO AND INTRAOCULAR LENS PLACEMENT ([place]) left diabetic;  Surgeon: Eulogio Bear, MD;  Location: [place];  Service: Ophthalmology;  Laterality: Left;  diabetic - oral meds sleep apnea  . CATARACT EXTRACTION W/PHACO Right 05/01/2016   Procedure: CATARACT EXTRACTION PHACO AND INTRAOCULAR LENS PLACEMENT ([place]) Right diabetic;  Surgeon: Eulogio Bear, MD;  Location: [place];  Service: Ophthalmology;  Laterality: Right;  Diabetic oral meds sleep apnea  . CERVICAL SPINE SURGERY    . COLONOSCOPY    . COLONOSCOPY WITH PROPOFOL N/A 06/10/2015   Procedure: COLONOSCOPY WITH PROPOFOL;  Surgeon: Manya Silvas, MD;  Location: [facility] ENDOSCOPY;  Service: Endoscopy;  Laterality: N/A;  .  CORONARY ANGIOPLASTY    . ESOPHAGOGASTRODUODENOSCOPY ENDOSCOPY    . LUMBAR SPINE SURGERY    . THORACIC SPINE SURGERY    . TONSILLECTOMY      Prior to Admission medications   Medication Sig Start Date End Date Taking? Authorizing Provider  albuterol (PROVENTIL HFA;VENTOLIN HFA) 108 (90 Base) MCG/ACT inhaler Inhale into the lungs every 6 (six) hours as needed for wheezing or shortness of breath.    [provider]  aspirin EC 81 MG tablet Take 81 mg by mouth daily.    [provider]  clopidogrel (PLAVIX)  75 MG tablet Take 1 tablet (75 mg total) by mouth daily. 02/01/16   Hillary Bow, MD  Cyanocobalamin (VITAMIN B-12 PO) Take by mouth.    [provider]  donepezil (ARICEPT) 5 MG tablet Take 5 mg by mouth 2 (two) times daily.    [provider]  finasteride (PROSCAR) 5 MG tablet Take 1 tablet (5 mg total) by mouth daily. 11/21/15   McGowan, Larene Beach A, PA-C  glyBURIDE-metformin (GLUCOVANCE) 5-500 MG per tablet TAKE ONE TABLET BY MOUTH TWICE DAILY WITH MEALS 04/01/14   [provider]  losartan (COZAAR) 100 MG tablet Take 100 mg by mouth daily.    [provider]  magnesium oxide (MAG-OX) 400 MG tablet Take 400 mg by mouth 2 (two) times daily.     [provider]  mirabegron ER (MYRBETRIQ) 50 MG TB24 tablet Take 1 tablet (50 mg total) by mouth daily. Patient not taking: Reported on 01/30/2016 03/01/15   Hollice Espy, MD  Multiple Vitamin (MULTI-VITAMINS) TABS Take 1 tablet by mouth daily.     [provider]  oxymetazoline (AFRIN) 0.05 % nasal spray Place 1 spray into both nostrils 2 (two) times daily.    [provider]  pioglitazone (ACTOS) 45 MG tablet Take 45 mg by mouth daily.  04/16/14   [provider]  Probiotic Product (PROBIOTIC PO) Take by mouth.    [provider]  simvastatin (ZOCOR) 40 MG tablet Take 40 mg by mouth daily at 6 PM.  07/02/14   [provider]  umeclidinium-vilanterol (ANORO ELLIPTA) 62.5-25 MCG/INH AEPB Inhale 1 puff into the lungs daily.    [provider]    Allergies Hydrocodone and Oxycodone-acetaminophen  Family History  Problem Relation Age of Onset  . Stroke Father   . Anesthesia problems Father   . Anesthesia problems Mother   . Prostate cancer Brother   . Kidney disease Neg Hx   . Bladder Cancer Neg Hx     Social History Social History  Substance Use Topics  . Smoking status: Former Smoker    Types: Cigarettes    Quit date: 07/13/1973  . Smokeless  tobacco: Never Used     Comment: reports he smoked 4 packs a day for 10 years and quit in 1975  . Alcohol use 0.6 oz/week    1 Standard drinks or equivalent per week     Comment: rarely drinks, for holidays    Review of Systems Constitutional: No fever/chills Eyes: No visual changes. ENT: No sore throat. No stiff neck no neck pain Cardiovascular: Denies chest pain. Respiratory: Denies shortness of breath. Gastrointestinal:   no vomiting.  No diarrhea.  No constipation. Genitourinary: Negative for dysuria. Musculoskeletal: Negative lower extremity swelling Skin: Negative for rash. Neurological: Negative for severe headaches, focal weakness or numbness. 10-point ROS otherwise negative.  ____________________________________________   PHYSICAL EXAM:  VITAL SIGNS: ED Triage Vitals  Enc Vitals  Group     BP 05/28/16 1334 117/69     Pulse Rate 05/28/16 1334 88     Resp 05/28/16 1334 20     Temp 05/28/16 1334 98.2 F (36.8 C)     Temp Source 05/28/16 1334 Oral     SpO2 05/28/16 1334 97 %     Weight 05/28/16 1337 265 lb (120.2 kg)     Height 05/28/16 1337 5' 6.5" (1.689 m)     Head Circumference --      Peak Flow --      Pain Score --      Pain Loc --      Pain Edu? --      Excl. in [place]? --     Constitutional: Alert and oriented. Well appearing and in no acute distress. Eyes: Conjunctivae are normal. PERRL. EOMI. Head: Atraumatic. Nose: No congestion/rhinnorhea. Mouth/Throat: Mucous membranes are moist.  Oropharynx non-erythematous. Neck: No stridor.   Nontender with no meningismus Cardiovascular: Normal rate, regular rhythm. Grossly normal heart sounds.  Good peripheral circulation. Respiratory: Normal respiratory effort.  No retractions. Lungs CTAB. Abdominal: Soft and nontender. No distention. No guarding no rebound Back:  There is no focal tenderness or step off.  there is no midline tenderness there are no lesions noted. there is no CVA tenderness Musculoskeletal: No  lower extremity tenderness, no upper extremity tenderness. No joint effusions, no DVT signs strong distal pulses no edema Neurologic:  Normal speech and language. No gross focal neurologic deficits are appreciated. Patient has excellent strength bilateral lower showed, symmetric but diminished bilateral reflexes in the patellar region, no saddle anesthesia declines rectal exam negative straight leg raise Skin:  Skin is warm, dry and intact. No rash noted. Psychiatric: Mood and affect are normal. Speech and behavior are normal.  ____________________________________________   LABS (all labs ordered are listed, but only abnormal results are displayed)  Labs Reviewed - No data to display ____________________________________________  EKG  I personally interpreted any EKGs ordered by me or triage  ____________________________________________  RADIOLOGY  I reviewed any imaging ordered by me or triage that were performed during my shift and, if possible, patient and/or family made aware of any abnormal findings. ____________________________________________   PROCEDURES  Procedure(s) performed: None  Procedures  Critical Care performed: None  ____________________________________________   INITIAL IMPRESSION / ASSESSMENT AND PLAN / ED COURSE  Pertinent labs & imaging results that were available during my care of the patient were reviewed by me and considered in my medical decision making (see chart for details).  A 2 months of classic sciatic symptoms more on the right than on the left. I discussed with his neurologist. He does not feel acute imaging is required. The patient is neurologically intact with no evidence of cauda equina syndrome no evidence of significant neurologic decompensation. His neurologist, Dr. Manuella Ghazi,  would like to have the patient receive presented taper and muscle relaxers and he will see him in the office. I have given the patient since her return precautions for  any new or worrisome symptoms including numbness weakness or incontinence of bowel or bladder. He states he has not had any of that yet it is my hope that he will not. No evidence of epidural abscess no evidence of AAA or intra-abdominal cyst. Patient has classic sciatic symptoms with neurologically intact exam.    ____________________________________________   FINAL CLINICAL IMPRESSION(S) / ED DIAGNOSES  Final diagnoses:  None      This chart was dictated using voice  recognition software.  Despite best efforts to proofread,  errors can occur which can change meaning.      Schuyler Amor, MD 05/28/16 [id number]

## 2016-05-28 NOTE — ED Notes (Signed)
Pt reports feeling as though "I am being tased when I try to walk." Pt has significant  Hx of pine and back injury. Pt able to ambulate independently but is in visible pain when each step. Pt denies loss of bowels but reports intermittent numbness in arms and legs. Pt reports neurologist is aware.

## 2016-05-29 ENCOUNTER — Ambulatory Visit: Payer: Medicare Other

## 2016-05-31 ENCOUNTER — Ambulatory Visit: Payer: Medicare Other

## 2016-06-05 ENCOUNTER — Ambulatory Visit: Payer: Medicare Other

## 2016-06-05 VITALS — BP 121/94 | HR 110

## 2016-06-05 DIAGNOSIS — R262 Difficulty in walking, not elsewhere classified: Secondary | ICD-10-CM | POA: Diagnosis not present

## 2016-06-05 DIAGNOSIS — Z9181 History of falling: Secondary | ICD-10-CM

## 2016-06-05 DIAGNOSIS — R2689 Other abnormalities of gait and mobility: Secondary | ICD-10-CM

## 2016-06-05 DIAGNOSIS — R42 Dizziness and giddiness: Secondary | ICD-10-CM
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
## 2016-06-07 ENCOUNTER — Ambulatory Visit: Payer: Medicare Other

## 2016-06-07 DIAGNOSIS — R262 Difficulty in walking, not elsewhere classified: Secondary | ICD-10-CM | POA: Diagnosis not present

## 2016-06-07 DIAGNOSIS — R2689 Other abnormalities of gait and mobility: Secondary | ICD-10-CM

## 2016-06-07 DIAGNOSIS — Z9181 History of falling: Secondary | ICD-10-CM

## 2016-06-07 DIAGNOSIS — R42 Dizziness and giddiness: Secondary | ICD-10-CM
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
## 2016-06-12 ENCOUNTER — Ambulatory Visit: Payer: Medicare Other | Attending: Neurology

## 2016-06-12 DIAGNOSIS — R2689 Other abnormalities of gait and mobility: Secondary | ICD-10-CM | POA: Diagnosis present

## 2016-06-12 DIAGNOSIS — R262 Difficulty in walking, not elsewhere classified: Secondary | ICD-10-CM

## 2016-06-12 DIAGNOSIS — Z9181 History of falling: Secondary | ICD-10-CM | POA: Diagnosis present

## 2016-06-12 DIAGNOSIS — R42 Dizziness and giddiness: Secondary | ICD-10-CM | POA: Insufficient documentation
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
## 2016-06-14 ENCOUNTER — Ambulatory Visit: Payer: Medicare Other

## 2016-06-18 ENCOUNTER — Ambulatory Visit: Payer: Medicare Other

## 2016-06-18 DIAGNOSIS — R42 Dizziness and giddiness: Secondary | ICD-10-CM

## 2016-06-18 DIAGNOSIS — R262 Difficulty in walking, not elsewhere classified: Secondary | ICD-10-CM | POA: Diagnosis not present

## 2016-06-18 DIAGNOSIS — R2689 Other abnormalities of gait and mobility: Secondary | ICD-10-CM

## 2016-06-18 DIAGNOSIS — Z9181 History of falling: Secondary | ICD-10-CM
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
## 2016-06-21 ENCOUNTER — Ambulatory Visit: Payer: Medicare Other

## 2016-06-25 ENCOUNTER — Ambulatory Visit: Payer: Medicare Other

## 2016-06-25 DIAGNOSIS — R42 Dizziness and giddiness: Secondary | ICD-10-CM

## 2016-06-25 DIAGNOSIS — R262 Difficulty in walking, not elsewhere classified: Secondary | ICD-10-CM

## 2016-06-25 DIAGNOSIS — R2689 Other abnormalities of gait and mobility: Secondary | ICD-10-CM

## 2016-06-25 DIAGNOSIS — Z9181 History of falling: Secondary | ICD-10-CM
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
## 2016-06-27 ENCOUNTER — Ambulatory Visit: Payer: Medicare Other

## 2016-07-02 ENCOUNTER — Ambulatory Visit: Payer: Medicare Other

## 2016-07-04 ENCOUNTER — Ambulatory Visit: Payer: Medicare Other

## 2016-07-09 ENCOUNTER — Ambulatory Visit: Payer: Medicare Other

## 2016-07-12 ENCOUNTER — Ambulatory Visit: Payer: Medicare Other

## 2016-07-16 ENCOUNTER — Ambulatory Visit: Payer: Medicare Other

## 2016-07-18 ENCOUNTER — Ambulatory Visit: Payer: Medicare Other

## 2016-08-15 ENCOUNTER — Other Ambulatory Visit: Payer: Medicare Other

## 2016-08-15 ENCOUNTER — Other Ambulatory Visit: Payer: Self-pay

## 2016-08-15 DIAGNOSIS — N401 Enlarged prostate with lower urinary tract symptoms: Secondary | ICD-10-CM

## 2016-08-16 LAB — PSA: Prostate Specific Ag, Serum: 1.9 ng/mL (ref 0.0–4.0)

## 2016-08-20 ENCOUNTER — Telehealth: Payer: Self-pay

## 2016-08-20 NOTE — Telephone Encounter (Signed)
-----   Message from Nori Riis, PA-C sent at 08/20/2016  8:35 AM EDT ----- Please let Mr. Garraway know that his PSA has come down.  We will see him in December.

## 2016-08-20 NOTE — Telephone Encounter (Signed)
Left pt mess to call 

## 2016-08-20 NOTE — Telephone Encounter (Signed)
Patient returned the call.  PSA results were provided and follow up lab and office visits were scheduled for December.  Patient voiced understanding.

## 2016-12-10 ENCOUNTER — Other Ambulatory Visit: Payer: Medicare Other

## 2016-12-10 ENCOUNTER — Other Ambulatory Visit: Payer: Self-pay

## 2016-12-10 DIAGNOSIS — N401 Enlarged prostate with lower urinary tract symptoms: Secondary | ICD-10-CM

## 2016-12-11 LAB — PSA: PROSTATE SPECIFIC AG, SERUM: 2.3 ng/mL (ref 0.0–4.0)

## 2016-12-14 NOTE — Progress Notes (Deleted)
12/17/2016 8:51 AM   [name] [birth year] [id number]  Referring provider: Juluis Pitch, MD [phone] [place], [place] [address]  No chief complaint on file.   HPI: Patient is 76 year old Caucasian male with OAB, a history of elevated PSA and BPH with LUTS who presents today for 3 weeks follow up after restarting his Myrbetriq.    OAB He states that the Myrbetriq helped, but his BP was 183/96.  Today, he complains of frequency, urgency, nocturia, incontinence and a weak urinary stream.  He also suffers with constipation.   He is sleeping with a CPAP machine.    History of elevated PSA History of rising PSA.  Most recent PSA is 2.3 in 12/2016.   Brother has a history of prostate cancer.  BPH WITH LUTS His IPSS score today is ***, which is *** lower urinary tract symptomatology. He is *** with his quality life due to his urinary symptoms.  His PVR is *** mL.  His previous IPSS score was 22/3.   His previous PVR was 91 mL.  His major complaints today are listed under OAB.  He has had these symptoms for the last several years.  He denies any dysuria, hematuria or suprapubic pain.  He currently taking finasteride 5 mg daily.  He also denies any recent fevers, chills, nausea or vomiting.     Score:  1-7 Mild 8-19 Moderate 20-35 Severe   PMH: Past Medical History:  Diagnosis Date  . Allergy   . Arthritis   . Back pain   . Barrett esophagus   . BPH (benign prostatic hyperplasia)   . Chronic kidney disease   . Coronary artery disease   . Depression   . Diabetes mellitus without complication ([place])   . Diverticulitis   . Dysphagia   . Esophageal reflux   . GERD (gastroesophageal reflux disease)   . Hyperlipidemia   . Hypersomnia   . Hypertension   . Kidney stones   . Low testosterone   . OAB (overactive bladder)   . Presence of dental bridge    2 - top  . Rheumatic fever   . Sleep apnea    BiPAP  . Stroke ([place]) 01/2016  . Vertigo   . Vitamin B12  deficiency     Surgical History: Past Surgical History:  Procedure Laterality Date  . BACK SURGERY    . CARDIAC CATHETERIZATION  08/11/2014   Procedure: Left Heart Cath and Coronary Angiography;  Surgeon: Teodoro Spray, MD;  Location: [place] [facility];  Service: Cardiovascular;;  . CARDIAC CATHETERIZATION N/A 11/17/2014   Procedure: Right Heart Cath;  Surgeon: Teodoro Spray, MD;  Location: [place] [facility];  Service: Cardiovascular;  Laterality: N/A;  . CAROTID STENT    . CATARACT EXTRACTION W/PHACO Left 04/03/2016   Procedure: CATARACT EXTRACTION PHACO AND INTRAOCULAR LENS PLACEMENT ([place]) left diabetic;  Surgeon: Eulogio Bear, MD;  Location: [place];  Service: Ophthalmology;  Laterality: Left;  diabetic - oral meds sleep apnea  . CATARACT EXTRACTION W/PHACO Right 05/01/2016   Procedure: CATARACT EXTRACTION PHACO AND INTRAOCULAR LENS PLACEMENT ([place]) Right diabetic;  Surgeon: Eulogio Bear, MD;  Location: [place];  Service: Ophthalmology;  Laterality: Right;  Diabetic oral meds sleep apnea  . CERVICAL SPINE SURGERY    . COLONOSCOPY    . COLONOSCOPY WITH PROPOFOL N/A 06/10/2015   Procedure: COLONOSCOPY WITH PROPOFOL;  Surgeon: Manya Silvas, MD;  Location: [facility] ENDOSCOPY;  Service: Endoscopy;  Laterality: N/A;  . CORONARY ANGIOPLASTY    . ESOPHAGOGASTRODUODENOSCOPY ENDOSCOPY    . LUMBAR SPINE SURGERY    . THORACIC SPINE SURGERY    . TONSILLECTOMY      Home Medications:  Allergies as of 12/17/2016      Reactions   Hydrocodone Shortness Of Breath   Oxycodone-acetaminophen Shortness Of Breath      Medication List        Accurate as of 12/14/16  8:51 AM. Always use your most recent med list.          albuterol 108 (90 Base) MCG/ACT inhaler Commonly known as:  PROVENTIL HFA;VENTOLIN HFA Inhale into the lungs every 6 (six) hours as needed for wheezing or shortness of breath.   ANORO ELLIPTA 62.5-25 MCG/INH Aepb Generic drug:   umeclidinium-vilanterol Inhale 1 puff into the lungs daily.   aspirin EC 81 MG tablet Take 81 mg by mouth daily.   clopidogrel 75 MG tablet Commonly known as:  PLAVIX Take 1 tablet (75 mg total) by mouth daily.   cyclobenzaprine 5 MG tablet Commonly known as:  FLEXERIL Take 1 tablet (5 mg total) by mouth 3 (three) times daily as needed for muscle spasms.   donepezil 5 MG tablet Commonly known as:  ARICEPT Take 5 mg by mouth 2 (two) times daily.   finasteride 5 MG tablet Commonly known as:  PROSCAR Take 1 tablet (5 mg total) by mouth daily.   glyBURIDE-metformin 5-500 MG tablet Commonly known as:  GLUCOVANCE TAKE ONE TABLET BY MOUTH TWICE DAILY WITH MEALS   losartan 100 MG tablet Commonly known as:  COZAAR Take 100 mg by mouth daily.   magnesium oxide 400 MG tablet Commonly known as:  MAG-OX Take 400 mg by mouth 2 (two) times daily.   mirabegron ER 50 MG Tb24 tablet Commonly known as:  MYRBETRIQ Take 1 tablet (50 mg total) by mouth daily.   MULTI-VITAMINS Tabs Take 1 tablet by mouth daily.   oxymetazoline 0.05 % nasal spray Commonly known as:  AFRIN Place 1 spray into both nostrils 2 (two) times daily.   pioglitazone 45 MG tablet Commonly known as:  ACTOS Take 45 mg by mouth daily.   predniSONE 10 MG tablet Commonly known as:  DELTASONE 60 mg day 1, 50 mg day 2, 40 mg day 3, 30mg  day 4, 20 mg day 5, 10 mg day 6.   PROBIOTIC PO Take by mouth.   simvastatin 40 MG tablet Commonly known as:  ZOCOR Take 40 mg by mouth daily at 6 PM.   VITAMIN B-12 PO Take by mouth.       Allergies:  Allergies  Allergen Reactions  . Hydrocodone Shortness Of Breath  . Oxycodone-Acetaminophen Shortness Of Breath    Family History: Family History  Problem Relation Age of Onset  . Stroke Father   . Anesthesia problems Father   . Anesthesia problems Mother   . Prostate cancer Brother   . Kidney disease Neg Hx   . Bladder Cancer Neg Hx     Social History:   reports that he quit smoking about 43 years ago. His smoking use included cigarettes. he has never used smokeless tobacco. He reports that he drinks about 0.6 oz of alcohol per week. He reports that he does not use drugs.  ROS:  Physical Exam: There were no vitals taken for this visit.  Constitutional: Well nourished. Alert and oriented, No acute distress. HEENT: [place] AT, moist mucus membranes. Trachea midline, no masses. Cardiovascular: No clubbing, cyanosis, or edema. Respiratory: Normal respiratory effort, no increased work of breathing. Skin: No rashes, bruises or suspicious lesions. Lymph: No cervical or inguinal adenopathy. Neurologic: Grossly intact, no focal deficits, moving all 4 extremities. Psychiatric: Normal mood and affect.  Laboratory Data: PSA History  1.3 ng/mL on 02/2014  1.4 ng/mL on 03/01/2015  1.7 ng/mL on 11/21/2015  2.6 ng/mL on 05/2016  1.9 ng/mL on 08/2016  2.3 ng/mL on 12/2016 Lab Results  Component Value Date   WBC 6.4 02/02/2016   HGB 14.1 02/02/2016   HCT 40.1 02/02/2016   MCV 91.4 02/02/2016   PLT 158 02/02/2016    Lab Results  Component Value Date   CREATININE 1.05 02/02/2016    Lab Results  Component Value Date   AST 25 01/30/2016   Lab Results  Component Value Date   ALT 21 01/30/2016    Pertinent Imaging: ***  Assessment & Plan:    1. OAB  - restart Myrbetriq 50 mg daily- stated it helped, but BP was elevated at 183/96  - BLADDER SCAN AMB NON-IMAGING  - offered PT, but is not interested  - discussed PTNS, patient is interested and will schedule  2. History of elevated PSA  - current PSA was 2.3ng/mL   - continue yearly screenings due to finasteride medication  3. BPH with LUTS  - IPSS score is 22/3  - Continue conservative management, avoiding bladder irritants and timed voiding's  - Continue finasteride 5 mg daily; refills given   - RTC in 6 for IPSS, PSA  and PVR    No Follow-up on file.  These notes generated with voice recognition software. I apologize for typographical errors.  Zara Council, [facility] [address] [place], [place] [address] [phone]

## 2016-12-17 ENCOUNTER — Ambulatory Visit: Payer: Medicare Other | Admitting: Urology

## 2016-12-18 NOTE — Progress Notes (Signed)
12/19/2016 4:00 PM   Taylor 11-Apr-1940 [id number]  Referring provider: Juluis Pitch, MD [phone] [place], [place] [address]  Chief Complaint  Patient presents with  . Benign Prostatic Hypertrophy    HPI: Patient is 76 year old Caucasian male with OAB, a history of elevated PSA and BPH with LUTS who presents today for one year follow up.    OAB He states that the Myrbetriq helped, but his BP was 183/96.  Not a candidate for anticholinergics due to age and memory issues.  Today, he complains of frequency, urgency, nocturia, incontinence and a weak urinary stream.  He also suffers with constipation.   He is sleeping with a CPAP machine.  He was to start PTNS, but he states he never got a call from our office to reschedule.    History of elevated PSA History of rising PSA.  Most recent PSA is 2.3 in 12/2016.   Brother has a history of prostate cancer.  BPH WITH LUTS His IPSS score today is 20, which is severe lower urinary tract symptomatology.  He is unhappy with his quality life due to his urinary symptoms.  His PVR is 0 mL.  His previous IPSS score was 22/3.   His previous PVR was 91 mL.  His major complaints today are listed under OAB.  He has had these symptoms for the last several years.  He is concerned that he may have a stricture as he voids very small amounts, strains to urinate and urinates frequently.  He states his urologist in [place], [place] told him he messed up some stuff down there when he removed a stone 30 to 40 years ago.  He denies any dysuria, hematuria or suprapubic pain.  He currently taking finasteride 5 mg daily.  He also denies any recent fevers, chills, nausea or vomiting.   IPSS    Row Name 12/19/16 1100         International Prostate Symptom Score   How often have you had the sensation of not emptying your bladder?  Not at All     How often have you had to urinate less than every two hours?  Almost always     How often have you found you  stopped and started again several times when you urinated?  Not at All     How often have you found it difficult to postpone urination?  Almost always     How often have you had a weak urinary stream?  Almost always     How often have you had to strain to start urination?  Not at All     How many times did you typically get up at night to urinate?  5 Times     Total IPSS Score  20       Quality of Life due to urinary symptoms   If you were to spend the rest of your life with your urinary condition just the way it is now how would you feel about that?  Unhappy        Score:  1-7 Mild 8-19 Moderate 20-35 Severe   PMH: Past Medical History:  Diagnosis Date  . Allergy   . Arthritis   . Back pain   . Barrett esophagus   . BPH (benign prostatic hyperplasia)   . Chronic kidney disease   . Coronary artery disease   . Depression   . Diabetes mellitus without complication ([facility])   . Diverticulitis   .  Dysphagia   . Esophageal reflux   . GERD (gastroesophageal reflux disease)   . Hyperlipidemia   . Hypersomnia   . Hypertension   . Kidney stones   . Low testosterone   . OAB (overactive bladder)   . Presence of dental bridge    2 - top  . Rheumatic fever   . Sleep apnea    BiPAP  . Stroke ([facility]) 01/2016  . Vertigo   . Vitamin B12 deficiency     Surgical History: Past Surgical History:  Procedure Laterality Date  . BACK SURGERY    . CARDIAC CATHETERIZATION  08/11/2014   Procedure: Left Heart Cath and Coronary Angiography;  Surgeon: Teodoro Spray, MD;  Location: [facility];  Service: Cardiovascular;;  . CARDIAC CATHETERIZATION N/A 11/17/2014   Procedure: Right Heart Cath;  Surgeon: Teodoro Spray, MD;  Location: [facility];  Service: Cardiovascular;  Laterality: N/A;  . CAROTID STENT    . CATARACT EXTRACTION W/PHACO Left 04/03/2016   Procedure: CATARACT EXTRACTION PHACO AND INTRAOCULAR LENS PLACEMENT ([facility]) left diabetic;  Surgeon: Eulogio Bear, MD;   Location: [facility];  Service: Ophthalmology;  Laterality: Left;  diabetic - oral meds sleep apnea  . CATARACT EXTRACTION W/PHACO Right 05/01/2016   Procedure: CATARACT EXTRACTION PHACO AND INTRAOCULAR LENS PLACEMENT ([facility]) Right diabetic;  Surgeon: Eulogio Bear, MD;  Location: [facility];  Service: Ophthalmology;  Laterality: Right;  Diabetic oral meds sleep apnea  . CERVICAL SPINE SURGERY    . COLONOSCOPY    . COLONOSCOPY WITH PROPOFOL N/A 06/10/2015   Procedure: COLONOSCOPY WITH PROPOFOL;  Surgeon: Manya Silvas, MD;  Location: [facility];  Service: Endoscopy;  Laterality: N/A;  . CORONARY ANGIOPLASTY    . ESOPHAGOGASTRODUODENOSCOPY ENDOSCOPY    . LUMBAR SPINE SURGERY    . THORACIC SPINE SURGERY    . TONSILLECTOMY      Home Medications:  Allergies as of 12/19/2016      Reactions   Hydrocodone Shortness Of Breath   Oxycodone-acetaminophen Shortness Of Breath      Medication List        Accurate as of 12/19/16  4:00 PM. Always use your most recent med list.          albuterol 108 (90 Base) MCG/ACT inhaler Commonly known as:  PROVENTIL HFA;VENTOLIN HFA Inhale into the lungs every 6 (six) hours as needed for wheezing or shortness of breath.   ANORO ELLIPTA 62.5-25 MCG/INH Aepb Generic drug:  umeclidinium-vilanterol Inhale 1 puff into the lungs daily.   aspirin EC 81 MG tablet Take 81 mg by mouth daily.   clopidogrel 75 MG tablet Commonly known as:  PLAVIX Take 1 tablet (75 mg total) by mouth daily.   cyclobenzaprine 5 MG tablet Commonly known as:  FLEXERIL Take 1 tablet (5 mg total) by mouth 3 (three) times daily as needed for muscle spasms.   donepezil 5 MG tablet Commonly known as:  ARICEPT Take 5 mg by mouth 2 (two) times daily.   finasteride 5 MG tablet Commonly known as:  PROSCAR Take 1 tablet (5 mg total) by mouth daily.   glyBURIDE-metformin 5-500 MG tablet Commonly known as:  GLUCOVANCE TAKE ONE TABLET BY MOUTH TWICE DAILY  WITH MEALS   losartan 100 MG tablet Commonly known as:  COZAAR Take 100 mg by mouth daily.   magnesium oxide 400 MG tablet Commonly known as:  MAG-OX Take 400 mg by mouth 2 (two) times daily.  meclizine 12.5 MG tablet Commonly known as:  ANTIVERT Take 12.5 mg by mouth 3 (three) times daily as needed for dizziness.   MULTI-VITAMINS Tabs Take 1 tablet by mouth daily.   oxymetazoline 0.05 % nasal spray Commonly known as:  AFRIN Place 1 spray into both nostrils 2 (two) times daily.   pioglitazone 45 MG tablet Commonly known as:  ACTOS Take 45 mg by mouth daily.   PROBIOTIC PO Take by mouth.   simvastatin 40 MG tablet Commonly known as:  ZOCOR Take 40 mg by mouth daily at 6 PM.   VITAMIN B-12 PO Take by mouth.       Allergies:  Allergies  Allergen Reactions  . Hydrocodone Shortness Of Breath  . Oxycodone-Acetaminophen Shortness Of Breath    Family History: Family History  Problem Relation Age of Onset  . Stroke Father   . Anesthesia problems Father   . Anesthesia problems Mother   . Prostate cancer Brother   . Kidney disease Neg Hx   . Bladder Cancer Neg Hx     Social History:  reports that he quit smoking about 43 years ago. His smoking use included cigarettes. he has never used smokeless tobacco. He reports that he drinks about 0.6 oz of alcohol per week. He reports that he does not use drugs.  ROS: UROLOGY Frequent Urination?: Yes Hard to postpone urination?: Yes Burning/pain with urination?: No Get up at night to urinate?: Yes Leakage of urine?: Yes Urine stream starts and stops?: No Trouble starting stream?: No Do you have to strain to urinate?: No Blood in urine?: No Urinary tract infection?: No Sexually transmitted disease?: No Injury to kidneys or bladder?: No Painful intercourse?: No Weak stream?: No Erection problems?: Yes Penile pain?: No  Gastrointestinal Nausea?: No Vomiting?: No Indigestion/heartburn?: No Diarrhea?:  Yes Constipation?: No  Constitutional Fever: No Night sweats?: No Weight loss?: No Fatigue?: No  Skin Skin rash/lesions?: No Itching?: No  Eyes Blurred vision?: No Double vision?: No  Ears/Nose/Throat Sore throat?: No Sinus problems?: No  Hematologic/Lymphatic Swollen glands?: No Easy bruising?: Yes  Cardiovascular Leg swelling?: Yes Chest pain?: No  Respiratory Cough?: No Shortness of breath?: Yes  Endocrine Excessive thirst?: No  Musculoskeletal Back pain?: Yes Joint pain?: Yes  Neurological Headaches?: No Dizziness?: Yes  Psychologic Depression?: No Anxiety?: No  Physical Exam: BP (!) 147/78 (BP Location: Right Arm, Patient Position: Sitting, Cuff Size: Large)   Pulse (!) 112   Ht 5' 6.5" (1.689 m)   Wt 287 lb 14.4 oz (130.6 kg)   BMI 45.77 kg/m   Constitutional: Well nourished. Alert and oriented, No acute distress. HEENT:  AT, moist mucus membranes. Trachea midline, no masses. Cardiovascular: No clubbing, cyanosis, or edema. Respiratory: Normal respiratory effort, no increased work of breathing. Skin: No rashes, bruises or suspicious lesions. Lymph: No cervical or inguinal adenopathy. Neurologic: Grossly intact, no focal deficits, moving all 4 extremities. Psychiatric: Normal mood and affect.  Laboratory Data: PSA History  1.3 ng/mL on 02/2014  1.4 ng/mL on 03/01/2015  1.7 ng/mL on 11/21/2015  2.6 ng/mL on 05/2016  1.9 ng/mL on 08/2016  2.3 ng/mL on 12/2016 Lab Results  Component Value Date   WBC 6.4 02/02/2016   HGB 14.1 02/02/2016   HCT 40.1 02/02/2016   MCV 91.4 02/02/2016   PLT 158 02/02/2016    Lab Results  Component Value Date   CREATININE 1.05 02/02/2016    Lab Results  Component Value Date   AST 25 01/30/2016   Lab  Results  Component Value Date   ALT 21 01/30/2016    Pertinent Imaging: Results for DAYSHAUN, [place] (MRN [id number]) as of 12/19/2016 15:57  Ref. Range 12/19/2016 11:42  Scan Result Unknown  0    Assessment & Plan:    1. OAB  - BLADDER SCAN AMB NON-IMAGING  - offered PT, but is not interested  - discussed PTNS, patient is interested and will schedule if cystoscopy is normal  2. History of elevated PSA  - current PSA was 2.3ng/mL   - continue yearly screenings due to finasteride medication  3. BPH with LUTS  - IPSS score is 20/5, it is worse  - Continue conservative management, avoiding bladder irritants and timed voiding's  - Continue finasteride 5 mg daily; refills given   - will schedule cystoscopy as patient is concerned he may have a stricture and he does have symptoms that are suspicious for one      Return for cystoscopy to rule out stricture.  These notes generated with voice recognition software. I apologize for typographical errors.  Zara Council, [facility] [address] [place], [place] [address] [phone]

## 2016-12-19 ENCOUNTER — Encounter: Payer: Self-pay | Admitting: Urology

## 2016-12-19 ENCOUNTER — Telehealth: Payer: Self-pay | Admitting: Urology

## 2016-12-19 ENCOUNTER — Ambulatory Visit (INDEPENDENT_AMBULATORY_CARE_PROVIDER_SITE_OTHER): Payer: Medicare Other | Admitting: Urology

## 2016-12-19 VITALS — BP 147/78 | HR 112 | Ht 66.5 in | Wt 287.9 lb

## 2016-12-19 DIAGNOSIS — N138 Other obstructive and reflux uropathy: Secondary | ICD-10-CM | POA: Diagnosis not present

## 2016-12-19 DIAGNOSIS — N3281 Overactive bladder: Secondary | ICD-10-CM | POA: Diagnosis not present

## 2016-12-19 DIAGNOSIS — Z87898 Personal history of other specified conditions: Secondary | ICD-10-CM | POA: Diagnosis not present

## 2016-12-19 DIAGNOSIS — N401 Enlarged prostate with lower urinary tract symptoms: Secondary | ICD-10-CM | POA: Diagnosis not present

## 2016-12-19 DIAGNOSIS — I639 Cerebral infarction, unspecified: Secondary | ICD-10-CM

## 2016-12-19 LAB — BLADDER SCAN AMB NON-IMAGING: Scan Result: 0

## 2016-12-19 NOTE — Telephone Encounter (Signed)
Would you see if his insurance will cover PTNS?

## 2016-12-19 NOTE — Telephone Encounter (Signed)
He has regular medicare and [employer] supplement so there in no PA required and they will cover it.  Sharyn Lull

## 2016-12-30 ENCOUNTER — Other Ambulatory Visit: Payer: Self-pay | Admitting: Urology

## 2017-01-10 ENCOUNTER — Encounter: Payer: Self-pay | Admitting: Urology

## 2017-01-10 ENCOUNTER — Ambulatory Visit (INDEPENDENT_AMBULATORY_CARE_PROVIDER_SITE_OTHER): Payer: Medicare Other | Admitting: Urology

## 2017-01-10 VITALS — BP 164/82 | HR 66 | Ht 66.5 in | Wt 290.3 lb

## 2017-01-10 DIAGNOSIS — N3281 Overactive bladder: Secondary | ICD-10-CM

## 2017-01-10 DIAGNOSIS — N401 Enlarged prostate with lower urinary tract symptoms: Secondary | ICD-10-CM

## 2017-01-10 LAB — MICROSCOPIC EXAMINATION
BACTERIA UA: NONE SEEN
EPITHELIAL CELLS (NON RENAL): NONE SEEN /HPF (ref 0–10)
WBC, UA: NONE SEEN /hpf (ref 0–?)

## 2017-01-10 LAB — URINALYSIS, COMPLETE
Bilirubin, UA: NEGATIVE
Glucose, UA: NEGATIVE
Ketones, UA: NEGATIVE
LEUKOCYTES UA: NEGATIVE
Nitrite, UA: NEGATIVE
PH UA: 8.5 — AB (ref 5.0–7.5)
Protein, UA: NEGATIVE
RBC UA: NEGATIVE
Specific Gravity, UA: 1.02 (ref 1.005–1.030)
Urobilinogen, Ur: 0.2 mg/dL (ref 0.2–1.0)

## 2017-01-10 MED ORDER — LIDOCAINE HCL 2 % EX GEL
1.0000 "application " | Freq: Once | CUTANEOUS | Status: AC
  Administered 2017-01-10: 1 via URETHRAL

## 2017-01-10 MED ORDER — CIPROFLOXACIN HCL 500 MG PO TABS
500.0000 mg | ORAL_TABLET | Freq: Once | ORAL | Status: AC
  Administered 2017-01-10: 500 mg via ORAL

## 2017-01-10 NOTE — Progress Notes (Signed)
   01/10/17  CC:  Chief Complaint  Patient presents with  . Cysto    HPI: Patient is 77 year old Caucasian male with OAB, a history of elevated PSA and BPH with LUTS who presents today for one year follow up.    OAB He states that the Myrbetriq helped, but his BP was 183/96.  Not a candidate for anticholinergics due to age and memory issues.  Today, he complains of frequency, urgency, nocturia, incontinence and a weak urinary stream.  He also suffers with constipation.   He is sleeping with a CPAP machine.  He was to start PTNS, but he states he never got a call from our office to reschedule.    BPH WITH LUTS His IPSS score today is 20, which is severe lower urinary tract symptomatology.  He is unhappy with his quality life due to his urinary symptoms.  His PVR is 0 mL.  His previous IPSS score was 22/3.   His previous PVR was 91 mL.  His major complaints today are listed under OAB.  He has had these symptoms for the last several years.  He is concerned that he may have a stricture as he voids very small amounts, strains to urinate and urinates frequently.  He states his urologist in [place], [place] told him he messed up some stuff down there when he removed a stone 30 to 40 years ago.  He denies any dysuria, hematuria or suprapubic pain.  He currently taking     Blood pressure (!) 164/82, pulse 66, height 5' 6.5" (1.689 m), weight 290 lb 4.8 oz (131.7 kg). NED. A&Ox3.   No respiratory distress   Abd soft, NT, ND Normal phallus with bilateral descended testicles  Cystoscopy Procedure Note  Patient identification was confirmed, informed consent was obtained, and patient was prepped using Betadine solution.  Lidocaine jelly was administered per urethral meatus.    Preoperative abx where received prior to procedure.     Pre-Procedure: - Inspection reveals a normal caliber ureteral meatus.  Procedure: The flexible cystoscope was introduced without difficulty - No urethral  strictures/lesions are present. - Enlarged prostate approximately 3-4 cm in length.  Somewhat visually obstructive. - Normal bladder neck - Bilateral ureteral orifices identified - Bladder mucosa  reveals no ulcers, tumors, or lesions - No bladder stones - No trabeculation  Retroflexion shows no intravesical lobe   Post-Procedure: - Patient tolerated the procedure well  Assessment/ Plan:  1. BPH/OAB -No sign of stricture on cystoscopy today.  His prostate is somewhat visually obstructive.. -Continue finasteride -Since his symptoms have been more irritative (eps urge with low volume voids and incontinence), the plan has been in the past to have him begin PTNS if cystoscopy ruled out stricture.  We will arrange for this.

## 2017-01-16 NOTE — Progress Notes (Signed)
    Chief Complaint: No chief complaint on file.    HPI: Patient is a 77 yo WM who presents today to start a 12 weekly treatments of PTNS.  This will be # 1/12.      Previous Therapy:           Pelvic Floor Exercises - NO           Biofeedback - NO           Bladder Training - NO           Fluid Management - NO           Dietary Restrictions - NO       Failed Anticholinergics and Myrbetriq  Contraindications present for PTNS      Pacemaker - NO      Implantable defibrillator - NO      History of abnormal bleeding - NO      History of neuropathies or nerve damage -NO  Discussed with patient possible complications of procedure, such as discomfort, bleeding at insertion/stimulation site, procedure consent signed  Patient goals:     To not have to pee every time I get up.    Reemphasized that most patient's will see benefit by the 8th week of treatment, but about 10 to 20% of patient may be late responders.  If they happen to be late responders, it is important to continue with the therapy beyond the 12 weekly treatments as many of those patient find benefit.    Treatment #  Baseline   # 1   # 2   # 3   # 4   # 5   # 6  Overall improvement  Day time voids  Every time he gets up  Every time he gets up        Night time voids  2  2        Incontinence episodes  1  1        Urgency   mild  mild           PTNS treatment: The needle electrode was inserted into the lower, inner aspect of the patient's right leg. The surface electrode was placed on the inside arch of the foot on the treatment leg. The lead set was connected to the stimulator and the needle electrode clip was connected to the needle electrode. The stimulator that produces an adjustable electrical pulse that travels to the sacral nerve plexus via the tibial nerve was increased to 8 and tell the patient received a sensory response.     Assessment & Plan:   1. Urgency  Treatment Plan:  The needle electrode was  removed without difficulty to the patient.  Patient tolerated the procedure for 30 minutes.  He will return next week for # 2 out of 12 of their weekly PTNS treatment's    Return in about 1 week (around 01/24/2017) for # 2 PTNS.  These notes generated with voice recognition software. I apologize for typographical errors.  Zara Council, [facility] [address] [place], [place] [address] [phone]

## 2017-01-17 ENCOUNTER — Ambulatory Visit (INDEPENDENT_AMBULATORY_CARE_PROVIDER_SITE_OTHER): Payer: Medicare Other | Admitting: Urology

## 2017-01-17 DIAGNOSIS — R3915 Urgency of urination: Secondary | ICD-10-CM | POA: Diagnosis not present

## 2017-01-17 NOTE — Progress Notes (Signed)
PTNS  Session # 1  Health & Social Factors: None Caffeine: 6 Alcohol: 0 Daytime voids #per day: every time I get up Night-time voids #per night: 2 Urgency: mild Incontinence Episodes #per day: 1 Ankle used: right Treatment Setting: 8 Feeling/ Response: sensory Comments:   Preformed By: Zara Council, PA  Assistant: Toniann Fail, LPN

## 2017-01-23 NOTE — Progress Notes (Signed)
    Chief Complaint:  Chief Complaint  Patient presents with  . PTNS     HPI: Patient is a 77 yo WM who presents today to start a 12 weekly treatments of PTNS.  This will be # 2/12.      Previous Therapy:           Pelvic Floor Exercises - NO           Biofeedback - NO           Bladder Training - NO           Fluid Management - NO           Dietary Restrictions - NO       Failed Anticholinergics and Myrbetriq  Contraindications present for PTNS      Pacemaker - NO      Implantable defibrillator - NO      History of abnormal bleeding - NO      History of neuropathies or nerve damage -NO  Discussed with patient possible complications of procedure, such as discomfort, bleeding at insertion/stimulation site, procedure consent signed  Patient goals:     To not have to pee every time I get up.    Reemphasized that most patient's will see benefit by the 8th week of treatment, but about 10 to 20% of patient may be late responders.  If they happen to be late responders, it is important to continue with the therapy beyond the 12 weekly treatments as many of those patient find benefit.    Treatment #  Baseline   # 1   # 2   # 3   # 4   # 5   # 6  Overall improvement  Day time voids  Every time he gets up  Every time he gets up  Every time he gets up       Night time voids  2  2  2-3       Incontinence episodes  1  1  3-4       Urgency   mild  mild  strong          PTNS treatment: The needle electrode was inserted into the lower, inner aspect of the patient's left leg. The surface electrode was placed on the inside arch of the foot on the treatment leg. The lead set was connected to the stimulator and the needle electrode clip was connected to the needle electrode. The stimulator that produces an adjustable electrical pulse that travels to the sacral nerve plexus via the tibial nerve was increased to 9 until the patient received a sensory response.     Assessment & Plan:   1.  Urgency  Treatment Plan:  The needle electrode was removed without difficulty to the patient.  Patient tolerated the procedure for 30 minutes.  He will return next week for # 3 out of 12 of their weekly PTNS treatment's  Return for # 3 PTNS.  These notes generated with voice recognition software. I apologize for typographical errors.  Zara Council, [facility] [address], [place], [place] [address] [phone]

## 2017-01-24 ENCOUNTER — Ambulatory Visit (INDEPENDENT_AMBULATORY_CARE_PROVIDER_SITE_OTHER): Payer: Medicare Other | Admitting: Urology

## 2017-01-24 DIAGNOSIS — R3915 Urgency of urination: Secondary | ICD-10-CM | POA: Diagnosis not present

## 2017-01-24 NOTE — Progress Notes (Signed)
PTNS  Session # 2  Health & Social Factors: No Change Caffeine: 2 Alcohol: 0 Daytime voids #per day: every time I get up Night-time voids #per night: 2-3 Urgency: strong Incontinence Episodes #per day: 3-4 Ankle used: left Treatment Setting: 9 Feeling/ Response: sensory Comments:   Preformed By: Zara Council, PA   Assistant: Toniann Fail, LPN

## 2017-01-28 ENCOUNTER — Encounter: Payer: Self-pay | Admitting: Urology

## 2017-01-31 ENCOUNTER — Ambulatory Visit (INDEPENDENT_AMBULATORY_CARE_PROVIDER_SITE_OTHER): Payer: Medicare Other

## 2017-01-31 DIAGNOSIS — R3915 Urgency of urination: Secondary | ICD-10-CM

## 2017-01-31 NOTE — Progress Notes (Signed)
PTNS  Session # 3  Health & Social Factors: no change Caffeine: 2 cups Alcohol: 1-2 Daytime voids #per day: 4-5 Night-time voids #per night: 2-3 Urgency: strong Incontinence Episodes #per day: 0 Ankle used: right Treatment Setting: 7 Feeling/ Response: sensory Comments:   Preformed By: Toniann Fail, LPN    Follow Up: 1 week

## 2017-02-07 ENCOUNTER — Ambulatory Visit (INDEPENDENT_AMBULATORY_CARE_PROVIDER_SITE_OTHER): Payer: Medicare Other

## 2017-02-07 DIAGNOSIS — R3915 Urgency of urination: Secondary | ICD-10-CM | POA: Diagnosis not present

## 2017-02-07 NOTE — Progress Notes (Signed)
PTNS  Session # 4  Health & Social Factors: No change Caffeine: 2 Alcohol: 2 Daytime voids #per day: every time I get up Night-time voids #per night: 2-3 Urgency: strong Incontinence Episodes #per day: 0 Ankle used: left Treatment Setting: 5 Feeling/ Response: sensory Comments:   Preformed By: Toniann Fail, LPN   Follow Up: 1 week

## 2017-02-14 ENCOUNTER — Ambulatory Visit (INDEPENDENT_AMBULATORY_CARE_PROVIDER_SITE_OTHER): Payer: Medicare Other

## 2017-02-14 DIAGNOSIS — R3915 Urgency of urination: Secondary | ICD-10-CM

## 2017-02-14 NOTE — Progress Notes (Signed)
PTNS  Session # 5  Health & Social Factors: No change Caffeine: 2 Alcohol: 0 Daytime voids #per day: 5-7 Night-time voids #per night: 2-3 Urgency: strong Incontinence Episodes #per day: 0 Ankle used: right Treatment Setting: 9 Feeling/ Response: both Comments:   Preformed By: Toniann Fail, LPN   Follow Up: 1 week

## 2017-02-21 ENCOUNTER — Ambulatory Visit: Payer: Medicare Other | Admitting: Urology

## 2017-02-28 ENCOUNTER — Ambulatory Visit (INDEPENDENT_AMBULATORY_CARE_PROVIDER_SITE_OTHER): Payer: Medicare Other

## 2017-02-28 DIAGNOSIS — R3915 Urgency of urination: Secondary | ICD-10-CM

## 2017-02-28 NOTE — Progress Notes (Signed)
PTNS  Session # 6  Health & Social Factors: No change Caffeine: 2 cups Alcohol: 0 Daytime voids #per day: 5-7 Night-time voids #per night: 2-3 Urgency: Strong Incontinence Episodes #per day: 0 Ankle used: left Treatment Setting: 5 Feeling/ Response: both Comments:   Preformed By: Toniann Fail, LPN

## 2017-03-07 ENCOUNTER — Ambulatory Visit: Payer: Medicare Other

## 2017-03-07 DIAGNOSIS — R3915 Urgency of urination: Secondary | ICD-10-CM

## 2017-03-07 NOTE — Progress Notes (Signed)
Pt presented today for scheduled PTNS.  Unfortunately [facility] is out of leads.  Made pt aware.  Advised pt would call when leads were in hand. Pt voiced understanding.

## 2017-03-14 ENCOUNTER — Ambulatory Visit (INDEPENDENT_AMBULATORY_CARE_PROVIDER_SITE_OTHER): Payer: Medicare Other | Admitting: Urology

## 2017-03-14 DIAGNOSIS — R3915 Urgency of urination: Secondary | ICD-10-CM

## 2017-03-14 NOTE — Progress Notes (Signed)
PTNS  Session # 7  Health & Social Factors: No change Caffeine: 2 cups Alcohol: 0 Daytime voids #per day: 5-7 Night-time voids #per night: 1-2 Urgency: mild Incontinence Episodes #per day: 0 Ankle used: right Treatment Setting: 7 Feeling/ Response: both Comments:   Preformed By: Toniann Fail, LPN   Follow Up: 1 week

## 2017-03-21 ENCOUNTER — Ambulatory Visit (INDEPENDENT_AMBULATORY_CARE_PROVIDER_SITE_OTHER): Payer: Medicare Other

## 2017-03-21 DIAGNOSIS — R3915 Urgency of urination: Secondary | ICD-10-CM

## 2017-03-21 NOTE — Progress Notes (Signed)
PTNS  Session # 8  Health & Social Factors: No change Caffeine: 2 Alcohol: 0 Daytime voids #per day: 7 Night-time voids #per night: 2 Urgency: mild Incontinence Episodes #per day: 0 Ankle used: left Treatment Setting: 11 Feeling/ Response: both Comments:   Preformed By: Toniann Fail, LPN   Follow Up: 1 week

## 2017-03-28 ENCOUNTER — Ambulatory Visit (INDEPENDENT_AMBULATORY_CARE_PROVIDER_SITE_OTHER): Payer: Medicare Other

## 2017-03-28 DIAGNOSIS — R3915 Urgency of urination: Secondary | ICD-10-CM | POA: Diagnosis not present

## 2017-03-28 NOTE — Progress Notes (Signed)
PTNS  Session # 9  Health & Social Factors: no change Caffeine: 2 Alcohol: 0 Daytime voids #per day: 7 Night-time voids #per night: 2 Urgency: mild Incontinence Episodes #per day: 0 Ankle used: right Treatment Setting: 11 Feeling/ Response: both Comments:   Preformed By: Toniann Fail, LPN

## 2017-04-04 ENCOUNTER — Ambulatory Visit (INDEPENDENT_AMBULATORY_CARE_PROVIDER_SITE_OTHER): Payer: Medicare Other

## 2017-04-04 DIAGNOSIS — R3915 Urgency of urination: Secondary | ICD-10-CM | POA: Diagnosis not present

## 2017-04-04 NOTE — Progress Notes (Signed)
PTNS  Session # 10  Health & Social Factors: No change Caffeine: 2 Alcohol: 0 Daytime voids #per day: 5-7 Night-time voids #per night: 1-2 Urgency: mild Incontinence Episodes #per day: 0 Ankle used: left Treatment Setting: 8 Feeling/ Response: sensory Comments:   Preformed By: Toniann Fail, LPN

## 2017-04-10 ENCOUNTER — Other Ambulatory Visit: Payer: Self-pay | Admitting: Nurse Practitioner

## 2017-04-10 DIAGNOSIS — R194 Change in bowel habit: Secondary | ICD-10-CM

## 2017-04-10 DIAGNOSIS — R109 Unspecified abdominal pain: Secondary | ICD-10-CM

## 2017-04-11 ENCOUNTER — Ambulatory Visit (INDEPENDENT_AMBULATORY_CARE_PROVIDER_SITE_OTHER): Payer: Medicare Other

## 2017-04-11 DIAGNOSIS — R3915 Urgency of urination: Secondary | ICD-10-CM

## 2017-04-11 NOTE — Progress Notes (Signed)
PTNS  Session # 11  Health & Social Factors: no change Caffeine: 2 Alcohol: 0 Daytime voids #per day: 5-7 Night-time voids #per night: 1-2 Urgency: mild Incontinence Episodes #per day: 0 Ankle used: right Treatment Setting: 4 Feeling/ Response: sensory Comments:   Preformed By: Toniann Fail, LPN   Follow Up: Pt will make a 1 month f/u with Larene Beach at check out.

## 2017-04-16 ENCOUNTER — Ambulatory Visit
Admission: RE | Admit: 2017-04-16 | Discharge: 2017-04-16 | Disposition: A | Discharge status: Home / Self Care | Payer: Medicare Other | Source: Ambulatory Visit | Attending: Nurse Practitioner | Admitting: Nurse Practitioner

## 2017-04-16 DIAGNOSIS — I700 Atherosclerosis of aorta: Secondary | ICD-10-CM | POA: Insufficient documentation

## 2017-04-16 DIAGNOSIS — K5730 Diverticulosis of large intestine without perforation or abscess without bleeding: Secondary | ICD-10-CM | POA: Insufficient documentation

## 2017-04-16 DIAGNOSIS — R109 Unspecified abdominal pain: Secondary | ICD-10-CM

## 2017-04-16 DIAGNOSIS — R194 Change in bowel habit: Secondary | ICD-10-CM | POA: Diagnosis not present

## 2017-04-16 MED ORDER — IOPAMIDOL (ISOVUE-300) INJECTION 61%
100.0000 mL | Freq: Once | INTRAVENOUS | Status: AC | PRN
  Administered 2017-04-16: 100 mL via INTRAVENOUS

## 2017-05-13 ENCOUNTER — Ambulatory Visit: Payer: Medicare Other

## 2017-06-20 ENCOUNTER — Other Ambulatory Visit: Payer: Self-pay | Admitting: Nurse Practitioner

## 2017-06-20 DIAGNOSIS — R1310 Dysphagia, unspecified: Secondary | ICD-10-CM

## 2017-06-20 DIAGNOSIS — R110 Nausea: Secondary | ICD-10-CM

## 2017-06-23 NOTE — Progress Notes (Signed)
06/24/2017 11:46 AM   Taylor Austin Chauncey Cruel [birth year] [id number]  Referring provider: Juluis Pitch, MD [id number] [place], [place] [address]  Chief Complaint  Patient presents with  . Over Active Bladder    HPI: Patient is 77 year old Caucasian male with OAB, a history of elevated PSA and BPH with LUTS who presents today for follow up with his wife, Taylor Austin.    OAB He states that the Myrbetriq helped, but his BP was 183/96.  Not a candidate for anticholinergics due to age and memory issues.  He also suffers with constipation.   He is sleeping with a CPAP machine.  He has completed 12 weeks of PTNS.      History of elevated PSA History of rising PSA.  Most recent PSA is 2.3 in 12/2016.   Brother has a history of prostate cancer and two maternal uncles with prostate cancer.    BPH WITH LUTS His IPSS score today is 17, which is moderate lower urinary tract symptomatology.  He is mixed with his quality life due to his urinary symptoms.  His PVR is 0 mL.  His previous IPSS score was 20/5.   His previous PVR was 0 mL.  He states he is okay with his urinary symptoms at this time.  Cystoscopy performed on 01/10/2017 with Dr. Pilar Jarvis noted an enlarged prostate approximately 3-4 cm in length.  Somewhat visually obstructive.  He denies any dysuria, hematuria or suprapubic pain.  He currently taking finasteride 5 mg daily.  He also denies any recent fevers, chills, nausea or vomiting.    IPSS    Row Name 06/24/17 1100         International Prostate Symptom Score   How often have you had the sensation of not emptying your bladder?  Less than half the time     How often have you had to urinate less than every two hours?  More than half the time     How often have you found you stopped and started again several times when you urinated?  About half the time     How often have you found it difficult to postpone urination?  About half the time     How often have you had a weak urinary stream?   Less than half the time     How often have you had to strain to start urination?  Less than 1 in 5 times     How many times did you typically get up at night to urinate?  2 Times     Total IPSS Score  17       Quality of Life due to urinary symptoms   If you were to spend the rest of your life with your urinary condition just the way it is now how would you feel about that?  Mixed        Score:  1-7 Mild 8-19 Moderate 20-35 Severe Score:  1-7 Mild 8-19 Moderate 20-35 Severe   PMH: Past Medical History:  Diagnosis Date  . Allergy   . Arthritis   . Back pain   . Barrett esophagus   . BPH (benign prostatic hyperplasia)   . Chronic kidney disease   . Coronary artery disease   . Depression   . Diabetes mellitus without complication ([facility])   . Diverticulitis   . Dysphagia   . Esophageal reflux   . GERD (gastroesophageal reflux disease)   . Hyperlipidemia   . Hypersomnia   .  Hypertension   . Kidney stones   . Low testosterone   . OAB (overactive bladder)   . Presence of dental bridge    2 - top  . Rheumatic fever   . Sleep apnea    BiPAP  . Stroke ([facility]) 01/2016  . Vertigo   . Vitamin B12 deficiency     Surgical History: Past Surgical History:  Procedure Laterality Date  . BACK SURGERY    . CARDIAC CATHETERIZATION  08/11/2014   Procedure: Left Heart Cath and Coronary Angiography;  Surgeon: Teodoro Spray, MD;  Location: [facility];  Service: Cardiovascular;;  . CARDIAC CATHETERIZATION N/A 11/17/2014   Procedure: Right Heart Cath;  Surgeon: Teodoro Spray, MD;  Location: [facility];  Service: Cardiovascular;  Laterality: N/A;  . CAROTID STENT    . CATARACT EXTRACTION W/PHACO Left 04/03/2016   Procedure: CATARACT EXTRACTION PHACO AND INTRAOCULAR LENS PLACEMENT ([facility]) left diabetic;  Surgeon: Eulogio Bear, MD;  Location: [facility];  Service: Ophthalmology;  Laterality: Left;  diabetic - oral meds sleep apnea  . CATARACT EXTRACTION  W/PHACO Right 05/01/2016   Procedure: CATARACT EXTRACTION PHACO AND INTRAOCULAR LENS PLACEMENT ([facility]) Right diabetic;  Surgeon: Eulogio Bear, MD;  Location: [facility];  Service: Ophthalmology;  Laterality: Right;  Diabetic oral meds sleep apnea  . CERVICAL SPINE SURGERY    . COLONOSCOPY    . COLONOSCOPY WITH PROPOFOL N/A 06/10/2015   Procedure: COLONOSCOPY WITH PROPOFOL;  Surgeon: Manya Silvas, MD;  Location[facility];  Service: Endoscopy;  Laterality: N/A;  . CORONARY ANGIOPLASTY    . ESOPHAGOGASTRODUODENOSCOPY ENDOSCOPY    . LUMBAR SPINE SURGERY    . THORACIC SPINE SURGERY    . TONSILLECTOMY      Home Medications:  Allergies as of 06/24/2017      Reactions   Hydrocodone Shortness Of Breath   Oxycodone-acetaminophen Shortness Of Breath      Medication List        Accurate as of 06/24/17 11:46 AM. Always use your most recent med list.          ACCU-CHEK AVIVA PLUS test strip Generic drug:  glucose blood USE STRIP TO CHECK GLUCOSE ONCE DAILY   albuterol 108 (90 Base) MCG/ACT inhaler Commonly known as:  PROVENTIL HFA;VENTOLIN HFA Inhale into the lungs every 6 (six) hours as needed for wheezing or shortness of breath.   ANORO ELLIPTA 62.5-25 MCG/INH Aepb Generic drug:  umeclidinium-vilanterol Inhale 1 puff into the lungs daily.   aspirin EC 81 MG tablet Take 81 mg by mouth daily.   atorvastatin 40 MG tablet Commonly known as:  LIPITOR TAKE 1 TABLET BY MOUTH ONCE DAILY   BYDUREON 2 MG Pen Generic drug:  Exenatide ER Inject into the skin.   clopidogrel 75 MG tablet Commonly known as:  PLAVIX Take 1 tablet (75 mg total) by mouth daily.   donepezil 10 MG tablet Commonly known as:  ARICEPT Take by mouth.   FIFTY50 GLUCOSE METER 2.0 w/Device Kit Use as directed   finasteride 5 MG tablet Commonly known as:  PROSCAR TAKE ONE TABLET BY MOUTH ONCE DAILY   glyBURIDE-metformin 5-500 MG tablet Commonly known as:  GLUCOVANCE TAKE ONE TABLET BY  MOUTH TWICE DAILY WITH MEALS   losartan 100 MG tablet Commonly known as:  COZAAR Take 100 mg by mouth daily.   magnesium oxide 400 MG tablet Commonly known as:  MAG-OX Take 400 mg by mouth 2 (two) times daily.  meclizine 12.5 MG tablet Commonly known as:  ANTIVERT Take 12.5 mg by mouth 3 (three) times daily as needed for dizziness.   MULTI-VITAMINS Tabs Take 1 tablet by mouth daily.   oxymetazoline 0.05 % nasal spray Commonly known as:  AFRIN Place 1 spray into both nostrils 2 (two) times daily.   pantoprazole 40 MG tablet Commonly known as:  PROTONIX Take by mouth.   pioglitazone 45 MG tablet Commonly known as:  ACTOS Take 45 mg by mouth daily.   PROBIOTIC PO Take by mouth.   simvastatin 40 MG tablet Commonly known as:  ZOCOR Take 40 mg by mouth daily at 6 PM.   VITAMIN B-12 PO Take by mouth.       Allergies:  Allergies  Allergen Reactions  . Hydrocodone Shortness Of Breath  . Oxycodone-Acetaminophen Shortness Of Breath    Family History: Family History  Problem Relation Age of Onset  . Stroke Father   . Anesthesia problems Father   . Anesthesia problems Mother   . Prostate cancer Brother   . Kidney disease Neg Hx   . Bladder Cancer Neg Hx     Social History:  reports that he quit smoking about 43 years ago. His smoking use included cigarettes. He has never used smokeless tobacco. He reports that he drinks about 0.6 oz of alcohol per week. He reports that he does not use drugs.  ROS: UROLOGY Frequent Urination?: Yes Hard to postpone urination?: Yes Burning/pain with urination?: Yes Get up at night to urinate?: Yes Leakage of urine?: Yes Urine stream starts and stops?: Yes Trouble starting stream?: No Do you have to strain to urinate?: No Blood in urine?: No Urinary tract infection?: No Sexually transmitted disease?: No Injury to kidneys or bladder?: No Painful intercourse?: No Weak stream?: No Erection problems?: No Penile pain?:  No  Gastrointestinal Nausea?: Yes Vomiting?: Yes Indigestion/heartburn?: Yes Diarrhea?: Yes Constipation?: Yes  Constitutional Fever: No Night sweats?: No Weight loss?: No Fatigue?: No  Skin Skin rash/lesions?: No Itching?: No  Eyes Blurred vision?: No Double vision?: No  Ears/Nose/Throat Sore throat?: No Sinus problems?: No  Hematologic/Lymphatic Swollen glands?: No Easy bruising?: No  Cardiovascular Leg swelling?: No Chest pain?: No  Respiratory Cough?: No Shortness of breath?: No  Endocrine Excessive thirst?: No  Musculoskeletal Back pain?: No Joint pain?: No  Neurological Headaches?: No Dizziness?: No  Psychologic Depression?: No Anxiety?: No  Physical Exam: BP 140/80 (BP Location: Right Arm, Patient Position: Sitting, Cuff Size: Large)   Pulse 70   Ht 5' 6.5" (1.689 m)   Wt 270 lb 11.2 oz (122.8 kg)   BMI 43.04 kg/m   Constitutional: Well nourished. Alert and oriented, No acute distress. HEENT: [place] AT, moist mucus membranes. Trachea midline, no masses. Cardiovascular: No clubbing, cyanosis, or edema. Respiratory: Normal respiratory effort, no increased work of breathing. GI: Abdomen is soft, non tender, non distended, no abdominal masses. Liver and spleen not palpable.  No hernias appreciated.  Stool sample for occult testing is not indicated.   GU: No CVA tenderness.  No bladder fullness or masses.  Patient with buried phallus.   Urethral meatus is patent.  No penile discharge. No penile lesions or rashes. Scrotum without lesions, cysts, rashes and/or edema.  Testicles are located scrotally bilaterally. No masses are appreciated in the testicles. Left and right epididymis are normal. Rectal: Patient with  normal sphincter tone. Anus and perineum without scarring or rashes. No rectal masses are appreciated. Prostate is approximately 45 grams, no nodules  are appreciated. Seminal vesicles are normal. Skin: No rashes, bruises or suspicious  lesions. Lymph: No cervical or inguinal adenopathy. Neurologic: Grossly intact, no focal deficits, moving all 4 extremities. Psychiatric: Normal mood and affect.  Laboratory Data: PSA History (on finasteride)  1.3 ng/mL on 02/2014 (2.6)  1.4 ng/mL on 03/01/2015 (2.8)  1.7 ng/mL on 11/21/2015 (3.4)  2.6 ng/mL on 05/2016 (5.2)  1.9 ng/mL on 08/2016 (3.8)  2.3 ng/mL on 12/2016 (4.6) Lab Results  Component Value Date   WBC 6.4 02/02/2016   HGB 14.1 02/02/2016   HCT 40.1 02/02/2016   MCV 91.4 02/02/2016   PLT 158 02/02/2016    Lab Results  Component Value Date   CREATININE 1.05 02/02/2016    Lab Results  Component Value Date   AST 25 01/30/2016   Lab Results  Component Value Date   ALT 21 01/30/2016   I have reviewed the labs.  Pertinent Imaging: Results for AERION, BAGDASARIAN (MRN [id number]) as of 06/24/2017 11:52  Ref. Range 06/24/2017 11:51  Scan Result Unknown 0 mL     Assessment & Plan:    1. OAB Can not take Myrbetriq due to uncontrolled HTN Not a candidate for anticholinergics due to age Cystoscopy noted no strictures or CIS in 01/2017 PTNS was somewhat effective - would like to discontinue this at this time   2. History of elevated PSA Current PSA was 2.3 ng/mL (4.6) Continue biannual screenings due to finasteride medication  3. BPH with LUTS IPSS score is 20/5, it is worse Continue conservative management, avoiding bladder irritants and timed voiding's Continue finasteride 5 mg daily     Return in about 6 months (around 12/24/2017) for IPSS, PSA, PVR and exam.  These notes generated with voice recognition software. I apologize for typographical errors.  Zara Council, PA-C  [facility] [address] [place], [place] [address] [phone]

## 2017-06-24 ENCOUNTER — Ambulatory Visit (INDEPENDENT_AMBULATORY_CARE_PROVIDER_SITE_OTHER): Payer: Medicare Other | Admitting: Urology

## 2017-06-24 ENCOUNTER — Encounter: Payer: Self-pay | Admitting: Urology

## 2017-06-24 ENCOUNTER — Other Ambulatory Visit: Payer: Self-pay

## 2017-06-24 VITALS — BP 140/80 | HR 70 | Ht 66.5 in | Wt 270.7 lb

## 2017-06-24 DIAGNOSIS — N3281 Overactive bladder: Secondary | ICD-10-CM

## 2017-06-24 DIAGNOSIS — N401 Enlarged prostate with lower urinary tract symptoms: Secondary | ICD-10-CM

## 2017-06-24 DIAGNOSIS — Z87898 Personal history of other specified conditions: Secondary | ICD-10-CM | POA: Diagnosis not present

## 2017-06-24 LAB — BLADDER SCAN AMB NON-IMAGING: Scan Result: 0

## 2017-06-25 ENCOUNTER — Telehealth: Payer: Self-pay

## 2017-06-25 ENCOUNTER — Telehealth: Payer: Self-pay | Admitting: Urology

## 2017-06-25 LAB — PSA: Prostate Specific Ag, Serum: 3.4 ng/mL (ref 0.0–4.0)

## 2017-06-25 NOTE — Telephone Encounter (Signed)
-----   Message from Nori Riis, PA-C sent at 06/25/2017 10:00 AM EDT ----- Please let Taylor Austin know that his PSA is increasing.  It went from 2.3 to 3.4 in 6 months.  It shouldn't increase while he is on finasteride.  Has he been taking the finasteride daily?

## 2017-06-25 NOTE — Telephone Encounter (Signed)
Unable to leave message VM not set up.

## 2017-06-25 NOTE — Telephone Encounter (Signed)
Pt LM stating he missed a call from [facility] about his PSA, pt asks that someone call him back about his results. No further info was given. Please advise. Thanks.

## 2017-06-25 NOTE — Telephone Encounter (Signed)
Called pt no answer. LM for pt informing pt of the information below, advised pt to call back and provide information regarding medication.

## 2017-06-26 ENCOUNTER — Telehealth: Payer: Self-pay

## 2017-06-26 ENCOUNTER — Ambulatory Visit: Payer: Medicare Other

## 2017-06-26 ENCOUNTER — Telehealth: Payer: Self-pay | Admitting: Urology

## 2017-06-26 NOTE — Telephone Encounter (Signed)
-----   Message from Nori Riis, PA-C sent at 06/26/2017 12:46 PM EDT ----- I spoke with Mr. Mcpartlin concerning his increasing PSA levels.  We discussed that a possible cause for the elevation would be prostate cancer and that undergoing a prostate biopsy would be the recommendation to evaluate for a possible cancer.  He stated he did not want to "sit on this" and wants to go forward with a prostate biopsy.  Patient is on Plavix and we will need clearance for him to stop this medication for the biopsy.  He will also prior to the biopsy.

## 2017-06-26 NOTE — Telephone Encounter (Signed)
Cardiac clearance faxed to [facility] office.

## 2017-06-26 NOTE — Telephone Encounter (Signed)
Patient called back and wanted you to know that he does take his finasteride everyday. Please call them back   Taylor Austin

## 2017-06-27 ENCOUNTER — Encounter: Payer: Self-pay | Admitting: Student

## 2017-06-27 NOTE — Telephone Encounter (Signed)
Pt states he has spoken w/ shannon already.

## 2017-06-28 ENCOUNTER — Ambulatory Visit: Payer: Medicare Other | Admitting: Anesthesiology

## 2017-06-28 ENCOUNTER — Encounter: Payer: Self-pay | Admitting: *Deleted

## 2017-06-28 ENCOUNTER — Encounter
Admission: RE | Disposition: A | Discharge status: Home / Self Care | Payer: Self-pay | Source: Ambulatory Visit | Attending: Unknown Physician Specialty

## 2017-06-28 ENCOUNTER — Ambulatory Visit
Admission: RE | Admit: 2017-06-28 | Discharge: 2017-06-28 | Disposition: A | Discharge status: Home / Self Care | Payer: Medicare Other | Source: Ambulatory Visit | Attending: Unknown Physician Specialty | Admitting: Unknown Physician Specialty

## 2017-06-28 DIAGNOSIS — I2510 Atherosclerotic heart disease of native coronary artery without angina pectoris: Secondary | ICD-10-CM | POA: Insufficient documentation

## 2017-06-28 DIAGNOSIS — Z955 Presence of coronary angioplasty implant and graft: Secondary | ICD-10-CM | POA: Diagnosis not present

## 2017-06-28 DIAGNOSIS — E785 Hyperlipidemia, unspecified: Secondary | ICD-10-CM | POA: Insufficient documentation

## 2017-06-28 DIAGNOSIS — N400 Enlarged prostate without lower urinary tract symptoms: Secondary | ICD-10-CM | POA: Insufficient documentation

## 2017-06-28 DIAGNOSIS — Z79899 Other long term (current) drug therapy: Secondary | ICD-10-CM | POA: Diagnosis not present

## 2017-06-28 DIAGNOSIS — K219 Gastro-esophageal reflux disease without esophagitis: Secondary | ICD-10-CM | POA: Diagnosis not present

## 2017-06-28 DIAGNOSIS — Z7982 Long term (current) use of aspirin: Secondary | ICD-10-CM | POA: Diagnosis not present

## 2017-06-28 DIAGNOSIS — Z8673 Personal history of transient ischemic attack (TIA), and cerebral infarction without residual deficits: Secondary | ICD-10-CM | POA: Diagnosis not present

## 2017-06-28 DIAGNOSIS — Z9989 Dependence on other enabling machines and devices: Secondary | ICD-10-CM | POA: Insufficient documentation

## 2017-06-28 DIAGNOSIS — R1310 Dysphagia, unspecified: Secondary | ICD-10-CM | POA: Insufficient documentation

## 2017-06-28 DIAGNOSIS — I129 Hypertensive chronic kidney disease with stage 1 through stage 4 chronic kidney disease, or unspecified chronic kidney disease: Secondary | ICD-10-CM | POA: Insufficient documentation

## 2017-06-28 DIAGNOSIS — E538 Deficiency of other specified B group vitamins: Secondary | ICD-10-CM | POA: Diagnosis not present

## 2017-06-28 DIAGNOSIS — J449 Chronic obstructive pulmonary disease, unspecified: Secondary | ICD-10-CM | POA: Diagnosis not present

## 2017-06-28 DIAGNOSIS — E1122 Type 2 diabetes mellitus with diabetic chronic kidney disease: Secondary | ICD-10-CM | POA: Insufficient documentation

## 2017-06-28 DIAGNOSIS — N189 Chronic kidney disease, unspecified: Secondary | ICD-10-CM | POA: Diagnosis not present

## 2017-06-28 DIAGNOSIS — Z7902 Long term (current) use of antithrombotics/antiplatelets: Secondary | ICD-10-CM | POA: Diagnosis not present

## 2017-06-28 DIAGNOSIS — Z7984 Long term (current) use of oral hypoglycemic drugs: Secondary | ICD-10-CM | POA: Diagnosis not present

## 2017-06-28 DIAGNOSIS — K2950 Unspecified chronic gastritis without bleeding: Secondary | ICD-10-CM | POA: Insufficient documentation

## 2017-06-28 DIAGNOSIS — Z87891 Personal history of nicotine dependence: Secondary | ICD-10-CM | POA: Diagnosis not present

## 2017-06-28 DIAGNOSIS — K449 Diaphragmatic hernia without obstruction or gangrene: Secondary | ICD-10-CM | POA: Diagnosis not present

## 2017-06-28 DIAGNOSIS — G4730 Sleep apnea, unspecified: Secondary | ICD-10-CM | POA: Diagnosis not present

## 2017-06-28 HISTORY — PX: ESOPHAGOGASTRODUODENOSCOPY (EGD) WITH PROPOFOL: SHX5813

## 2017-06-28 HISTORY — DX: Chronic obstructive pulmonary disease, unspecified: J44.9

## 2017-06-28 HISTORY — DX: Cardiac murmur, unspecified: R01.1

## 2017-06-28 HISTORY — DX: Obesity, unspecified: E66.9

## 2017-06-28 HISTORY — DX: Anemia, unspecified: D64.9

## 2017-06-28 LAB — GLUCOSE, CAPILLARY: GLUCOSE-CAPILLARY: 143 mg/dL — AB (ref 65–99)

## 2017-06-28 SURGERY — ESOPHAGOGASTRODUODENOSCOPY (EGD) WITH PROPOFOL
Anesthesia: General

## 2017-06-28 MED ORDER — BUTAMBEN-TETRACAINE-BENZOCAINE 2-2-14 % EX AERO
INHALATION_SPRAY | CUTANEOUS | Status: AC
  Filled 2017-06-28: qty 5

## 2017-06-28 MED ORDER — PROPOFOL 500 MG/50ML IV EMUL
INTRAVENOUS | Status: AC
  Filled 2017-06-28: qty 50

## 2017-06-28 MED ORDER — BUTAMBEN-TETRACAINE-BENZOCAINE 2-2-14 % EX AERO
INHALATION_SPRAY | CUTANEOUS | Status: DC | PRN
  Administered 2017-06-28: 2 via TOPICAL

## 2017-06-28 MED ORDER — SODIUM CHLORIDE 0.9 % IV SOLN: INTRAVENOUS | Status: DC

## 2017-06-28 MED ORDER — SODIUM CHLORIDE 0.9 % IV SOLN
INTRAVENOUS | Status: DC
  Administered 2017-06-28: 1000 mL via INTRAVENOUS

## 2017-06-28 MED ORDER — PROPOFOL 10 MG/ML IV BOLUS
INTRAVENOUS | Status: DC | PRN
  Administered 2017-06-28 (×2): 30 mg via INTRAVENOUS
  Administered 2017-06-28: 20 mg via INTRAVENOUS
  Administered 2017-06-28: 50 mg via INTRAVENOUS
  Administered 2017-06-28 (×2): 30 mg via INTRAVENOUS

## 2017-06-28 MED ORDER — SODIUM CHLORIDE 0.9 % IV SOLN
INTRAVENOUS | Status: DC | PRN
  Administered 2017-06-28: 08:00:00 via INTRAVENOUS

## 2017-06-28 NOTE — Anesthesia Preprocedure Evaluation (Signed)
Anesthesia Evaluation  Patient identified by MRN, date of birth, ID band Patient awake    Reviewed: Allergy & Precautions, H&P , NPO status , Patient's Chart, lab work & pertinent test results, reviewed documented beta blocker date and time   Airway Mallampati: III   Neck ROM: full    Dental  (+) Poor Dentition, Teeth Intact   Pulmonary neg pulmonary ROS, sleep apnea and Continuous Positive Airway Pressure Ventilation , COPD, former smoker,    Pulmonary exam normal        Cardiovascular Exercise Tolerance: Poor hypertension, On Medications + angina with exertion + CAD  (-) CHF, (-) Orthopnea and (-) PND negative cardio ROS Normal cardiovascular exam+ Valvular Problems/Murmurs  Rhythm:regular Rate:Normal     Neuro/Psych PSYCHIATRIC DISORDERS Depression  Neuromuscular disease CVA, Residual Symptoms negative neurological ROS  negative psych ROS   GI/Hepatic negative GI ROS, Neg liver ROS, GERD  Medicated,  Endo/Other  negative endocrine ROSdiabetes  Renal/GU Renal diseasenegative Renal ROS  negative genitourinary   Musculoskeletal   Abdominal   Peds  Hematology negative hematology ROS (+) anemia ,   Anesthesia Other Findings Past Medical History: No date: Allergy No date: Anemia No date: Arthritis No date: Back pain No date: Barrett esophagus No date: BPH (benign prostatic hyperplasia) No date: Chronic kidney disease No date: COPD (chronic obstructive pulmonary disease) ([facility]) No date: Coronary artery disease No date: Depression No date: Diabetes mellitus without complication ([facility]) No date: Diverticulitis No date: Dysphagia No date: Esophageal reflux No date: Esophageal reflux No date: GERD (gastroesophageal reflux disease) No date: Heart murmur No date: Hyperlipidemia No date: Hypersomnia No date: Hypertension No date: Kidney stones No date: Low testosterone No date: OAB (overactive bladder) No date:  Obesity No date: Presence of dental bridge     Comment:  2 - top No date: Rheumatic fever No date: Sleep apnea     Comment:  BiPAP 01/2016: Stroke ([place]) No date: Vertigo No date: Vitamin B12 deficiency Past Surgical History: No date: BACK SURGERY 08/11/2014: CARDIAC CATHETERIZATION     Comment:  Procedure: Left Heart Cath and Coronary Angiography;                Surgeon: Teodoro Spray, MD;  Location: [facility];  Service: Cardiovascular;; 11/17/2014: CARDIAC CATHETERIZATION; N/A     Comment:  Procedure: Right Heart Cath;  Surgeon: Teodoro Spray,               MD;  Location: [facility];  Service:               Cardiovascular;  Laterality: N/A; No date: CAROTID STENT 04/03/2016: CATARACT EXTRACTION W/PHACO; Left     Comment:  Procedure: CATARACT EXTRACTION PHACO AND INTRAOCULAR               LENS PLACEMENT ([place]) left diabetic;  Surgeon: Eulogio Bear, MD;  Location: [facility];  Service:               Ophthalmology;  Laterality: Left;  diabetic - oral               meds sleep apnea 05/01/2016: CATARACT EXTRACTION W/PHACO; Right     Comment:  Procedure: CATARACT EXTRACTION PHACO AND INTRAOCULAR  LENS PLACEMENT ([facility]) Right diabetic;  Surgeon: Eulogio Bear, MD;  Location: [facility];  Service:               Ophthalmology;  Laterality: Right;  Diabetic oral               meds sleep apnea No date: CERVICAL SPINE SURGERY No date: COLONOSCOPY 06/10/2015: COLONOSCOPY WITH PROPOFOL; N/A     Comment:  Procedure: COLONOSCOPY WITH PROPOFOL;  Surgeon: Manya Silvas, MD;  Location: [facility];  Service:               Endoscopy;  Laterality: N/A; No date: CORONARY ANGIOPLASTY No date: ESOPHAGOGASTRODUODENOSCOPY ENDOSCOPY No date: EYE SURGERY No date: LUMBAR SPINE SURGERY No date: THORACIC SPINE SURGERY No date: TONSILLECTOMY BMI    Body Mass Index:  51.56 kg/m      Reproductive/Obstetrics negative OB ROS                             Anesthesia Physical Anesthesia Plan  ASA: IV  Anesthesia Plan: General   Post-op Pain Management:    Induction:   PONV Risk Score and Plan:   Airway Management Planned:   Additional Equipment:   Intra-op Plan:   Post-operative Plan:   Informed Consent: I have reviewed the patients History and Physical, chart, labs and discussed the procedure including the risks, benefits and alternatives for the proposed anesthesia with the patient or authorized representative who has indicated his/her understanding and acceptance.   Dental Advisory Given  Plan Discussed with: CRNA  Anesthesia Plan Comments:         Anesthesia Quick Evaluation

## 2017-06-28 NOTE — H&P (Signed)
Primary Care Physician:  Juluis Pitch, MD Primary Gastroenterologist:  Dr. Vira Agar  Pre-Procedure History & Physical: HPI:  Taylor Austin is a 77 y.o. male is here for an endoscopy.  Done for dysphagia.   Past Medical History:  Diagnosis Date  . Allergy   . Anemia   . Arthritis   . Back pain   . Barrett esophagus   . BPH (benign prostatic hyperplasia)   . Chronic kidney disease   . COPD (chronic obstructive pulmonary disease) ([place])   . Coronary artery disease   . Depression   . Diabetes mellitus without complication ([place])   . Diverticulitis   . Dysphagia   . Esophageal reflux   . Esophageal reflux   . GERD (gastroesophageal reflux disease)   . Heart murmur   . Hyperlipidemia   . Hypersomnia   . Hypertension   . Kidney stones   . Low testosterone   . OAB (overactive bladder)   . Obesity   . Presence of dental bridge    2 - top  . Rheumatic fever   . Sleep apnea    BiPAP  . Stroke ([place]) 01/2016  . Vertigo   . Vitamin B12 deficiency     Past Surgical History:  Procedure Laterality Date  . BACK SURGERY    . CARDIAC CATHETERIZATION  08/11/2014   Procedure: Left Heart Cath and Coronary Angiography;  Surgeon: Teodoro Spray, MD;  Location: [place] [facility];  Service: Cardiovascular;;  . CARDIAC CATHETERIZATION N/A 11/17/2014   Procedure: Right Heart Cath;  Surgeon: Teodoro Spray, MD;  Location: [facility];  Service: Cardiovascular;  Laterality: N/A;  . CAROTID STENT    . CATARACT EXTRACTION W/PHACO Left 04/03/2016   Procedure: CATARACT EXTRACTION PHACO AND INTRAOCULAR LENS PLACEMENT ([place]) left diabetic;  Surgeon: Eulogio Bear, MD;  Location: [facility];  Service: Ophthalmology;  Laterality: Left;  diabetic - oral meds sleep apnea  . CATARACT EXTRACTION W/PHACO Right 05/01/2016   Procedure: CATARACT EXTRACTION PHACO AND INTRAOCULAR LENS PLACEMENT ([place]) Right diabetic;  Surgeon: Eulogio Bear, MD;  Location: [facility];   Service: Ophthalmology;  Laterality: Right;  Diabetic oral meds sleep apnea  . CERVICAL SPINE SURGERY    . COLONOSCOPY    . COLONOSCOPY WITH PROPOFOL N/A 06/10/2015   Procedure: COLONOSCOPY WITH PROPOFOL;  Surgeon: Manya Silvas, MD;  Location: [facility];  Service: Endoscopy;  Laterality: N/A;  . CORONARY ANGIOPLASTY    . ESOPHAGOGASTRODUODENOSCOPY ENDOSCOPY    . EYE SURGERY    . LUMBAR SPINE SURGERY    . THORACIC SPINE SURGERY    . TONSILLECTOMY      Prior to Admission medications   Medication Sig Start Date End Date Taking? Authorizing Provider  ACCU-CHEK AVIVA PLUS test strip USE STRIP TO CHECK GLUCOSE ONCE DAILY 06/19/17  Yes [provider]  albuterol (PROVENTIL HFA;VENTOLIN HFA) 108 (90 Base) MCG/ACT inhaler Inhale into the lungs every 6 (six) hours as needed for wheezing or shortness of breath.   Yes [provider]  alum & mag hydroxide-simeth (MAALOX PLUS) 400-400-40 MG/5ML suspension Take by mouth every 6 (six) hours as needed for indigestion.   Yes [provider]  aspirin EC 81 MG tablet Take 81 mg by mouth daily.   Yes [provider]  aspirin-acetaminophen-caffeine (EXCEDRIN MIGRAINE) [phone] MG tablet Take by mouth every 6 (six) hours as needed for headache.   Yes [provider]  Blood Glucose Monitoring Suppl (  FIFTY50 GLUCOSE METER 2.0) w/Device KIT Use as directed 06/19/17 06/19/18 Yes [provider]  clopidogrel (PLAVIX) 75 MG tablet Take 1 tablet (75 mg total) by mouth daily. 02/01/16  Yes Sudini, Alveta Heimlich, MD  Cyanocobalamin (VITAMIN B-12 PO) Take by mouth.   Yes [provider]  donepezil (ARICEPT) 10 MG tablet Take by mouth. 04/15/17 07/14/17 Yes [provider]  Exenatide ER (BYDUREON) 2 MG PEN Inject into the skin. 03/14/17  Yes [provider]  finasteride (PROSCAR) 5 MG tablet TAKE ONE TABLET BY MOUTH ONCE DAILY 01/01/17  Yes McGowan, Larene Beach A, PA-C  glyBURIDE-metformin (GLUCOVANCE)  5-500 MG per tablet TAKE ONE TABLET BY MOUTH TWICE DAILY WITH MEALS 04/01/14  Yes [provider]  losartan (COZAAR) 100 MG tablet Take 100 mg by mouth daily.   Yes [provider]  magnesium oxide (MAG-OX) 400 MG tablet Take 400 mg by mouth 2 (two) times daily.    Yes [provider]  meclizine (ANTIVERT) 12.5 MG tablet Take 12.5 mg by mouth 3 (three) times daily as needed for dizziness.   Yes [provider]  Multiple Vitamin (MULTI-VITAMINS) TABS Take 1 tablet by mouth daily.    Yes [provider]  oxymetazoline (AFRIN) 0.05 % nasal spray Place 1 spray into both nostrils 2 (two) times daily.   Yes [provider]  pantoprazole (PROTONIX) 40 MG tablet Take by mouth. 04/10/17  Yes [provider]  pioglitazone (ACTOS) 45 MG tablet Take 45 mg by mouth daily.  04/16/14  Yes [provider]  Probiotic Product (PROBIOTIC PO) Take by mouth.   Yes [provider]  simvastatin (ZOCOR) 40 MG tablet Take 40 mg by mouth daily at 6 PM.  07/02/14  Yes [provider]  umeclidinium-vilanterol (ANORO ELLIPTA) 62.5-25 MCG/INH AEPB Inhale 1 puff into the lungs daily.   Yes [provider]  atorvastatin (LIPITOR) 40 MG tablet TAKE 1 TABLET BY MOUTH ONCE DAILY 04/29/17   [provider]    Allergies as of 06/25/2017 - Review Complete 06/24/2017  Allergen Reaction Noted  . Hydrocodone Shortness Of Breath 07/14/2014  . Oxycodone-acetaminophen Shortness Of Breath 07/14/2014    Family History  Problem Relation Age of Onset  . Stroke Father   . Anesthesia problems Father   . Anesthesia problems Mother   . Prostate cancer Brother   . Kidney disease Neg Hx   . Bladder Cancer Neg Hx     Social History   Socioeconomic History  . Marital status: Married    Spouse name: Not on file  . Number of children: Not on file  . Years of education: Not on file  . Highest education level: Not on file  Occupational  History  . Not on file  Social Needs  . Financial resource strain: Not on file  . Food insecurity:    Worry: Not on file    Inability: Not on file  . Transportation needs:    Medical: Not on file    Non-medical: Not on file  Tobacco Use  . Smoking status: Former Smoker    Types: Cigarettes    Last attempt to quit: 07/13/1973    Years since quitting: 43.9  . Smokeless tobacco: Never Used  . Tobacco comment: reports he smoked 4 packs a day for 10 years and quit in 1975  Substance and Sexual Activity  . Alcohol use: Yes    Alcohol/week: 0.6 oz    Types: 1 Standard drinks or equivalent per week  Comment: rarely drinks, for holidays  . Drug use: No  . Sexual activity: Not on file  Lifestyle  . Physical activity:    Days per week: Not on file    Minutes per session: Not on file  . Stress: Not on file  Relationships  . Social connections:    Talks on phone: Not on file    Gets together: Not on file    Attends religious service: Not on file    Active member of club or organization: Not on file    Attends meetings of clubs or organizations: Not on file    Relationship status: Not on file  . Intimate partner violence:    Fear of current or ex partner: Not on file    Emotionally abused: Not on file    Physically abused: Not on file    Forced sexual activity: Not on file  Other Topics Concern  . Not on file  Social History Narrative  . Not on file    Review of Systems: See HPI, otherwise negative ROS  Physical Exam: BP 115/87   Pulse 95   Temp (!) 97.1 F (36.2 C) (Tympanic)   Resp 20   Ht 5' (1.524 m)   Wt 119.7 kg (264 lb)   SpO2 100%   BMI 51.56 kg/m  General:   Alert,  pleasant and cooperative in NAD Head:  Normocephalic and atraumatic. Neck:  Supple; no masses or thyromegaly. Lungs:  Clear throughout to auscultation.    Heart:  Regular rate and rhythm. Abdomen:  Soft, nontender and nondistended. Normal bowel sounds, without guarding, and without rebound.   Obese, non tender,no masses. Neurologic:  Alert and  oriented x4;  grossly normal neurologically.  Impression/Plan: Taylor Austin is here for an endoscopy to be performed for dysphagia  Risks, benefits, limitations, and alternatives regarding  endoscopy have been reviewed with the patient.  Questions have been answered.  All parties agreeable.   Gaylyn Cheers, MD  06/28/2017, 8:37 AM

## 2017-06-28 NOTE — Anesthesia Post-op Follow-up Note (Signed)
Anesthesia QCDR form completed.        

## 2017-06-28 NOTE — Op Note (Signed)
[facility] Gastroenterology Patient Name: Taylor Austin Procedure Date: 06/28/2017 8:31 AM MRN: [id number] Account #: [id number] Date of Birth: 02-14-1940 Admit Type: Outpatient Age: 77 Room: [facility] ENDO ROOM 1 Gender: Male Note Status: Finalized Procedure:            Upper GI endoscopy Indications:          Dysphagia Providers:            Manya Silvas, MD Referring MD:         Youlanda Roys. Lovie Macadamia, MD (Referring MD) Medicines:            Propofol per Anesthesia, Cetacaine spray Complications:        No immediate complications. Procedure:            Pre-Anesthesia Assessment:                       - After reviewing the risks and benefits, the patient                        was deemed in satisfactory condition to undergo the                        procedure.                       After obtaining informed consent, the endoscope was                        passed under direct vision. Throughout the procedure,                        the patient's blood pressure, pulse, and oxygen                        saturations were monitored continuously. The Endoscope                        was introduced through the mouth, and advanced to the                        second part of duodenum. The upper GI endoscopy was                        accomplished without difficulty. The patient tolerated                        the procedure well. Findings:      The examined esophagus was normal. At the end of the procedure A       guidewire was placed and the scope was withdrawn. Dilation was performed       with a Savary dilator with no resistance at 17 mm. GEJ at 40cm. Small       hiatal hernia seen at junction of stomach and esophagus.      Localized mild inflammation characterized by erythema and granularity       was found in the gastric antrum. biopsies done. One biopsy done of       proximal stomach.      The examined duodenum was normal. Impression:           - Normal  esophagus. Dilated.                       -  Gastritis.                       - Normal examined duodenum.                       - No specimens collected. Recommendation:       - Await pathology results. Manya Silvas, MD 06/28/2017 8:59:52 AM This report has been signed electronically. Number of Addenda: 0 Note Initiated On: 06/28/2017 8:31 AM      [facility]

## 2017-06-28 NOTE — Transfer of Care (Signed)
Immediate Anesthesia Transfer of Care Note  Patient: Taylor Austin  Procedure(s) Performed: ESOPHAGOGASTRODUODENOSCOPY (EGD) WITH PROPOFOL (N/A )  Patient Location: PACU  Anesthesia Type:General  Level of Consciousness: awake  Airway & Oxygen Therapy: Patient Spontanous Breathing  Post-op Assessment: Report given to RN  Post vital signs: stable  Last Vitals:  Vitals Value Taken Time  BP 107/69 06/28/2017  9:00 AM  Temp    Pulse 85 06/28/2017  9:00 AM  Resp 24 06/28/2017  9:00 AM  SpO2 95 % 06/28/2017  9:00 AM  Vitals shown include unvalidated device data.  Last Pain:  Vitals:   06/28/17 0753  TempSrc: Tympanic  PainSc: 0-No pain         Complications: No apparent anesthesia complications

## 2017-07-01 LAB — SURGICAL PATHOLOGY

## 2017-07-01 NOTE — Telephone Encounter (Signed)
Patient's wife indicated that patient will also need a clearance from his neurologist, Dr. Jennings Books due to vascular dementia.    Note:  Patient was able to obtain clearance from both his Cardiologist and his Neurologist for his Upper Endoscopy with Dr. Vira Agar on 6/21.  Please advise when clearance are obtained so we can move his appointment sooner, if possible.

## 2017-07-03 NOTE — Telephone Encounter (Signed)
Per Dr. Erlene Quan ok to use clearance from endoscopy on 6-21 as far a neurology, patient notified of this

## 2017-07-09 NOTE — Anesthesia Postprocedure Evaluation (Signed)
Anesthesia Post Note  Patient: Taylor Austin  Procedure(s) Performed: ESOPHAGOGASTRODUODENOSCOPY (EGD) WITH PROPOFOL (N/A )  Patient location during evaluation: PACU Anesthesia Type: General Level of consciousness: awake and alert Pain management: pain level controlled Vital Signs Assessment: post-procedure vital signs reviewed and stable Respiratory status: spontaneous breathing, nonlabored ventilation, respiratory function stable and patient connected to nasal cannula oxygen Cardiovascular status: blood pressure returned to baseline and stable Postop Assessment: no apparent nausea or vomiting Anesthetic complications: no     Last Vitals:  Vitals:   06/28/17 0919 06/28/17 0929  BP: 120/66 120/70  Pulse: 75 67  Resp: 15 (!) 27  Temp:    SpO2: 97% 96%    Last Pain:  Vitals:   06/28/17 0929  TempSrc:   PainSc: 0-No pain                 Molli Barrows

## 2017-07-29 DIAGNOSIS — R1319 Other dysphagia: Secondary | ICD-10-CM | POA: Insufficient documentation

## 2017-07-29 DIAGNOSIS — K219 Gastro-esophageal reflux disease without esophagitis: Secondary | ICD-10-CM | POA: Insufficient documentation

## 2017-08-13 ENCOUNTER — Ambulatory Visit (INDEPENDENT_AMBULATORY_CARE_PROVIDER_SITE_OTHER): Payer: Medicare Other | Admitting: Urology

## 2017-08-13 ENCOUNTER — Other Ambulatory Visit: Payer: Self-pay | Admitting: Urology

## 2017-08-13 ENCOUNTER — Ambulatory Visit: Payer: Medicare Other | Admitting: Urology

## 2017-08-13 DIAGNOSIS — Z87898 Personal history of other specified conditions: Secondary | ICD-10-CM

## 2017-08-13 NOTE — Progress Notes (Signed)
   08/13/17  CC:  Chief Complaint  Patient presents with  . Prostate Biopsy    HPI: 77 year old male with a history of BPH and lower urinary symptoms as well as rising PSA.  He presents the office today for prostate biopsy.  Previous notes from [employer] and [facility] Gouty.  His most recent PSA has risen to 3.4 on finasteride which had risen from 2.36 months prior.  His rectal exam is unremarkable.  His PSA has been elevated in the past with some fluctuation.  He has stopped his aspirin and Plavix today, however, he did not do his enema.  We discussed given his history of PSA fluctuation in the past as well as his multiple medical comorbidities, may be reasonable to go ahead and repeat his PSA today to see where he is today.    If his PSA is more elevated or stably elevated, we will get him back on the schedule for prostate biopsy in the near future with instructions to do an enema beforehand.  His PSA is back down to baseline, will have a return to his routine surveillance follow-ups.  He is agreeable this plan.  Blood drawn today.  Will call him tomorrow.  He will hold his aspirin and Plavix until he hears from [place] tomorrow with instructions on whether or not he will be rescheduled.     Hollice Espy, MD

## 2017-08-14 ENCOUNTER — Telehealth: Payer: Self-pay

## 2017-08-14 LAB — PSA: Prostate Specific Ag, Serum: 3.3 ng/mL (ref 0.0–4.0)

## 2017-08-14 NOTE — Telephone Encounter (Signed)
-----   Message from Hollice Espy, MD sent at 08/14/2017  8:23 AM EDT ----- PSA is stable from 1 month ago but up from 8 months ago.  I think prostate biopsy is reasonable, lets get him back on the schedule for biopsy tomorrow at 1:30 please.  Please have him do the enema and continue to hold ASA/ plavix.    Hollice Espy, MD

## 2017-08-14 NOTE — Telephone Encounter (Signed)
Pt informed, agreed to prostate biopsy, please put on brandon for tomorrow at 130, I tried but was unable to place at 130.

## 2017-08-15 ENCOUNTER — Ambulatory Visit: Payer: Medicare Other | Admitting: Urology

## 2017-08-15 VITALS — BP 128/80 | HR 81 | Ht 66.0 in | Wt 252.0 lb

## 2017-08-15 DIAGNOSIS — R972 Elevated prostate specific antigen [PSA]: Secondary | ICD-10-CM

## 2017-08-15 MED ORDER — LEVOFLOXACIN 500 MG PO TABS
500.0000 mg | ORAL_TABLET | Freq: Once | ORAL | Status: AC
  Administered 2017-08-15: 500 mg via ORAL

## 2017-08-15 MED ORDER — GENTAMICIN SULFATE 40 MG/ML IJ SOLN
80.0000 mg | Freq: Once | INTRAMUSCULAR | Status: AC
  Administered 2017-08-15: 80 mg via INTRAMUSCULAR

## 2017-08-15 NOTE — Progress Notes (Signed)
   08/15/17  CC:  Chief Complaint  Patient presents with  . Prostate Biopsy    HPI: 77 year old male with rising PSA despite being on finasteride who presents today for prostate biopsy.  NED. A&Ox3.   No respiratory distress   Abd soft, NT, ND Normal sphincter tone  Prostate Biopsy Procedure   Informed consent was obtained after discussing risks/benefits of the procedure.  A time out was performed to ensure correct patient identity.  Pre-Procedure:- Gentamicin given prophylactically - Levaquin 500 mg administered PO -Transrectal Ultrasound performed revealing a 29 gm prostate -No significant hypoechoic or median lobe noted.  Multiple calcifications noted.  Procedure: - Prostate block performed using 10 cc 1% lidocaine and biopsies taken from sextant areas, a total of 12 under ultrasound guidance.  Post-Procedure: - Patient tolerated the procedure well - He was counseled to seek immediate medical attention if experiences any severe pain, significant bleeding, or fevers - Return in one/ two week to discuss biopsy results  Hollice Espy, MD

## 2017-08-16 ENCOUNTER — Encounter

## 2017-08-16 ENCOUNTER — Other Ambulatory Visit: Payer: Medicare Other | Admitting: Urology

## 2017-08-21 ENCOUNTER — Other Ambulatory Visit: Payer: Self-pay | Admitting: Urology

## 2017-08-21 LAB — PATHOLOGY REPORT

## 2017-09-08 DIAGNOSIS — C61 Malignant neoplasm of prostate: Secondary | ICD-10-CM | POA: Insufficient documentation

## 2017-09-10 ENCOUNTER — Encounter: Payer: Self-pay | Admitting: Urology

## 2017-09-10 ENCOUNTER — Ambulatory Visit (INDEPENDENT_AMBULATORY_CARE_PROVIDER_SITE_OTHER): Payer: Medicare Other | Admitting: Urology

## 2017-09-10 VITALS — BP 104/73 | Ht 66.0 in | Wt 249.0 lb

## 2017-09-10 DIAGNOSIS — R3912 Poor urinary stream: Secondary | ICD-10-CM

## 2017-09-10 DIAGNOSIS — C61 Malignant neoplasm of prostate: Secondary | ICD-10-CM | POA: Diagnosis not present

## 2017-09-10 DIAGNOSIS — N3281 Overactive bladder: Secondary | ICD-10-CM

## 2017-09-10 NOTE — Progress Notes (Signed)
09/10/2017 1:02 PM   Taylor Austin Taylor Austin October 24, 1940 [id number]  Referring provider: Juluis Pitch, MD [phone] [place], [place] [address]  Chief Complaint  Patient presents with  . Prostate Cancer    biopsy results    HPI: 77 year old male with multiple medical problems status post prostate biopsy returns today to discuss his pathology results.  He underwent prostate biopsy on 08/13/2017 due to rising PSA despite being on finasteride.  His PSA at the time was 3.3.  Prostate biopsy revealed only 2 cores positive of disease, 2% of the left base described as 4+3 prostate cancer as well as Gleason 3+3 at the left mid involving only 2%.  There is also a suspicious lesion in the left apex but this was nondiagnostic.  TRUS vol 29 cc.  He has severe baseline erectile dysfunction.  SHIM as below.  He also has severe poorly controlled urinary symptoms with both irritate and obstructive symptoms.  IPSS as below.  He reports that this have been persistently severe for quite some time.  He is currently on finasteride for this without much improvement.  Failed Mybetriq due to BP issues.  Unable to tolerate anticholengics due to memory issues and constipation.  S/p PTNS without much improvement.  He has previously underwent cystoscopy with Dr. Pilar Jarvis 01/2017 with 3-4 cm with "somewhat obstructive" without a median lobe of bladder pathology.    He did have fairly recent cross-sectional imaging on 04/2017 in the form of CT abdomen pelvis with contrast.  This showed no evidence of metastatic disease or lymphadenopathy.   IPSS    Row Name 09/10/17 1600         International Prostate Symptom Score   How often have you had the sensation of not emptying your bladder?  More than half the time     How often have you had to urinate less than every two hours?  Almost always     How often have you found you stopped and started again several times when you urinated?  Almost always     How often have you  found it difficult to postpone urination?  Almost always     How often have you had a weak urinary stream?  Almost always     How often have you had to strain to start urination?  Almost always     How many times did you typically get up at night to urinate?  4 Times     Total IPSS Score  33       Quality of Life due to urinary symptoms   If you were to spend the rest of your life with your urinary condition just the way it is now how would you feel about that?  Mostly Disatisfied        Score:  1-7 Mild 8-19 Moderate 20-35 Severe   SHIM    Row Name 09/10/17 1607         SHIM: Over the last 6 months:   How do you rate your confidence that you could get and keep an erection?  Low     When you had erections with sexual stimulation, how often were your erections hard enough for penetration (entering your partner)?  Almost Never or Never     During sexual intercourse, how often were you able to maintain your erection after you had penetrated (entered) your partner?  Almost Never or Never     During sexual intercourse, how difficult was it to maintain  your erection to completion of intercourse?  Extremely Difficult     When you attempted sexual intercourse, how often was it satisfactory for you?  Almost Never or Never       SHIM Total Score   SHIM  6         PMH: Past Medical History:  Diagnosis Date  . Allergy   . Anemia   . Arthritis   . Back pain   . Barrett esophagus   . BPH (benign prostatic hyperplasia)   . Chronic kidney disease   . COPD (chronic obstructive pulmonary disease) ([place])   . Coronary artery disease   . Depression   . Diabetes mellitus without complication ([place])   . Diverticulitis   . Dysphagia   . Esophageal reflux   . Esophageal reflux   . GERD (gastroesophageal reflux disease)   . Heart murmur   . Hyperlipidemia   . Hypersomnia   . Hypertension   . Kidney stones   . Low testosterone   . OAB (overactive bladder)   . Obesity   . Presence of  dental bridge    2 - top  . Rheumatic fever   . Sleep apnea    BiPAP  . Stroke ([place]) 01/2016  . Vertigo   . Vitamin B12 deficiency     Surgical History: Past Surgical History:  Procedure Laterality Date  . BACK SURGERY    . CARDIAC CATHETERIZATION  08/11/2014   Procedure: Left Heart Cath and Coronary Angiography;  Surgeon: Teodoro Spray, MD;  Location: [facility];  Service: Cardiovascular;;  . CARDIAC CATHETERIZATION N/A 11/17/2014   Procedure: Right Heart Cath;  Surgeon: Teodoro Spray, MD;  Location: [facility];  Service: Cardiovascular;  Laterality: N/A;  . CAROTID STENT    . CATARACT EXTRACTION W/PHACO Left 04/03/2016   Procedure: CATARACT EXTRACTION PHACO AND INTRAOCULAR LENS PLACEMENT ([place]) left diabetic;  Surgeon: Eulogio Bear, MD;  Location: [facility];  Service: Ophthalmology;  Laterality: Left;  diabetic - oral meds sleep apnea  . CATARACT EXTRACTION W/PHACO Right 05/01/2016   Procedure: CATARACT EXTRACTION PHACO AND INTRAOCULAR LENS PLACEMENT ([place]) Right diabetic;  Surgeon: Eulogio Bear, MD;  Location: [facility];  Service: Ophthalmology;  Laterality: Right;  Diabetic oral meds sleep apnea  . CERVICAL SPINE SURGERY    . COLONOSCOPY    . COLONOSCOPY WITH PROPOFOL N/A 06/10/2015   Procedure: COLONOSCOPY WITH PROPOFOL;  Surgeon: Manya Silvas, MD;  Location: [facility] ENDOSCOPY;  Service: Endoscopy;  Laterality: N/A;  . CORONARY ANGIOPLASTY    . ESOPHAGOGASTRODUODENOSCOPY (EGD) WITH PROPOFOL N/A 06/28/2017   Procedure: ESOPHAGOGASTRODUODENOSCOPY (EGD) WITH PROPOFOL;  Surgeon: Manya Silvas, MD;  Location: [facility] ENDOSCOPY;  Service: Endoscopy;  Laterality: N/A;  . ESOPHAGOGASTRODUODENOSCOPY ENDOSCOPY    . EYE SURGERY    . LUMBAR SPINE SURGERY    . THORACIC SPINE SURGERY    . TONSILLECTOMY      Home Medications:  Allergies as of 09/10/2017      Reactions   Hydrocodone Shortness Of Breath   Oxycodone-acetaminophen Shortness Of  Breath      Medication List        Accurate as of 09/10/17 11:59 PM. Always use your most recent med list.          ACCU-CHEK AVIVA PLUS test strip Generic drug:  glucose blood USE STRIP TO CHECK GLUCOSE ONCE DAILY   albuterol 108 (90 Base) MCG/ACT inhaler Commonly known as:  PROVENTIL HFA;VENTOLIN  HFA Inhale into the lungs every 6 (six) hours as needed for wheezing or shortness of breath.   alum & mag hydroxide-simeth 993-716-96 MG/5ML suspension Commonly known as:  MAALOX PLUS Take by mouth every 6 (six) hours as needed for indigestion.   ANORO ELLIPTA 62.5-25 MCG/INH Aepb Generic drug:  umeclidinium-vilanterol Inhale 1 puff into the lungs daily.   aspirin EC 81 MG tablet Take 81 mg by mouth daily.   aspirin-acetaminophen-caffeine 789-381-01 MG tablet Commonly known as:  EXCEDRIN MIGRAINE Take by mouth every 6 (six) hours as needed for headache.   atorvastatin 40 MG tablet Commonly known as:  LIPITOR TAKE 1 TABLET BY MOUTH ONCE DAILY   BYDUREON 2 MG Pen Generic drug:  Exenatide ER Inject into the skin.   clopidogrel 75 MG tablet Commonly known as:  PLAVIX Take 1 tablet (75 mg total) by mouth daily.   donepezil 10 MG tablet Commonly known as:  ARICEPT Take by mouth.   FIFTY50 GLUCOSE METER 2.0 w/Device Kit Use as directed   finasteride 5 MG tablet Commonly known as:  PROSCAR TAKE ONE TABLET BY MOUTH ONCE DAILY   glyBURIDE-metformin 5-500 MG tablet Commonly known as:  GLUCOVANCE TAKE ONE TABLET BY MOUTH TWICE DAILY WITH MEALS   losartan 100 MG tablet Commonly known as:  COZAAR Take 100 mg by mouth daily.   magnesium oxide 400 MG tablet Commonly known as:  MAG-OX Take 400 mg by mouth 2 (two) times daily.   meclizine 12.5 MG tablet Commonly known as:  ANTIVERT Take 12.5 mg by mouth 3 (three) times daily as needed for dizziness.   MULTI-VITAMINS Tabs Take 1 tablet by mouth daily.   oxymetazoline 0.05 % nasal spray Commonly known as:   AFRIN Place 1 spray into both nostrils 2 (two) times daily.   pantoprazole 40 MG tablet Commonly known as:  PROTONIX Take by mouth.   pioglitazone 45 MG tablet Commonly known as:  ACTOS Take 45 mg by mouth daily.   PROBIOTIC PO Take by mouth.   simvastatin 40 MG tablet Commonly known as:  ZOCOR Take 40 mg by mouth daily at 6 PM.   VITAMIN B-12 PO Take by mouth.       Allergies:  Allergies  Allergen Reactions  . Hydrocodone Shortness Of Breath  . Oxycodone-Acetaminophen Shortness Of Breath    Family History: Family History  Problem Relation Age of Onset  . Stroke Father   . Anesthesia problems Father   . Anesthesia problems Mother   . Prostate cancer Brother   . Kidney disease Neg Hx   . Bladder Cancer Neg Hx     Social History:  reports that he quit smoking about 44 years ago. His smoking use included cigarettes. He has never used smokeless tobacco. He reports that he drinks about 1.0 standard drinks of alcohol per week. He reports that he does not use drugs.  ROS: UROLOGY Frequent Urination?: Yes Hard to postpone urination?: Yes Burning/pain with urination?: No Get up at night to urinate?: Yes Leakage of urine?: Yes Urine stream starts and stops?: Yes Trouble starting stream?: Yes Do you have to strain to urinate?: No Blood in urine?: No Urinary tract infection?: No Sexually transmitted disease?: No Injury to kidneys or bladder?: No Painful intercourse?: No Weak stream?: Yes Erection problems?: Yes Penile pain?: No  Gastrointestinal Nausea?: No Vomiting?: No Indigestion/heartburn?: No Diarrhea?: No Constipation?: No  Constitutional Fever: No Night sweats?: No Weight loss?: Yes Fatigue?: Yes  Skin Skin rash/lesions?: No Itching?: No  Eyes Blurred vision?: No Double vision?: No  Ears/Nose/Throat Sore throat?: No Sinus problems?: No  Hematologic/Lymphatic Swollen glands?: No Easy bruising?: Yes  Cardiovascular Leg swelling?:  No Chest pain?: No  Respiratory Cough?: Yes Shortness of breath?: Yes  Endocrine Excessive thirst?: No  Musculoskeletal Back pain?: Yes Joint pain?: Yes  Neurological Headaches?: No Dizziness?: No  Psychologic Depression?: No Anxiety?: No  Physical Exam: BP 104/73   Ht _0  (1.676 m)   Wt 249 lb (112.9 kg)   BMI 40.19 kg/m   Constitutional:  Alert and oriented, No acute distress.  Accompanied by wife. HEENT: [place] AT, moist mucus membranes.  Trachea midline, no masses. Cardiovascular: No clubbing, cyanosis, or edema. Respiratory: Normal respiratory effort, no increased work of breathing. GI: Abdomen is soft, nontender, nondistended, no abdominal masses, obese. GU: No CVA tenderness Skin: No rashes, bruises or suspicious lesions. Neurologic: Grossly intact, no focal deficits, moving all 4 extremities. Psychiatric: Normal mood and affect.  Laboratory Data: Lab Results  Component Value Date   WBC 6.4 02/02/2016   HGB 14.1 02/02/2016   HCT 40.1 02/02/2016   MCV 91.4 02/02/2016   PLT 158 02/02/2016    Lab Results  Component Value Date   CREATININE 1.05 02/02/2016    Lab Results  Component Value Date   HGBA1C 6.6 (H) 01/31/2016    Urinalysis N/a  Pertinent Imaging: CT abd/ pelvis 04/2017 with unremarkable prostate, no pelvic adenopathy  Assessment & Plan:    1. Prostate cancer ([place]) Newly dx intermediate risk prostate cancer, low volume   The patient was counseled about the natural history of prostate cancer and the standard treatment options that are available for prostate cancer. It was explained to him how his age and life expectancy, clinical stage, Gleason score, and PSA affect his prognosis, the decision to proceed with additional staging studies, as well as how that information influences recommended treatment strategies. We discussed the roles for active surveillance, radiation therapy, surgical therapy, androgen deprivation, as well as ablative  therapy options for the treatment of prostate cancer as appropriate to his individual cancer situation. We discussed the risks and benefits of these options with regard to their impact on cancer control and also in terms of potential adverse events, complications, and impact on quality of life particularly related to urinary, bowel, and sexual function. The patient was encouraged to ask questions throughout the discussion today and all questions were answered to his stated satisfaction. In addition, the patient was providedwith and/or directed to appropriate resources and literature for further education about prostate cancer treatment options.  He is not a good surgical candidate based on his age, habitus and co morbidities.  He has accepted referral to Dr. Baruch Gouty for discussion of brachytherapy vs. EBRT + Lupron x 6 months.  We did go ahead and discuss the risk/ benefits of ADT should he elect to pursue EBRT.   - Ambulatory referral to Radiation Oncology  2. OAB (overactive bladder) Severe refractory poorly controlled urinary symptoms.  Prior to symptoms radiation, it would be helpful to have UDS to know there is an obstructive component and treat this prior to radiation.  If there obstruction, would recommend TURP prior to XRT as symptoms will likely be exacerbated by treatment.  We discussed urodynamics a length today,   All questions answered.   - Ambulatory referral to Urology  3. Weak urinary stream As above    Return for f/u after UDS at [facility].  Hollice Espy, MD  [place]  [place], [place], [place] [address] [phone]  I spent 40+ min with this patient of which greater than 50% was spent in counseling and coordination of care with the patient.

## 2017-09-19 ENCOUNTER — Other Ambulatory Visit: Payer: Self-pay

## 2017-09-19 ENCOUNTER — Encounter: Payer: Self-pay | Admitting: Radiation Oncology

## 2017-09-19 ENCOUNTER — Ambulatory Visit
Admission: RE | Admit: 2017-09-19 | Discharge: 2017-09-19 | Disposition: A | Discharge status: Home / Self Care | Payer: Medicare Other | Source: Ambulatory Visit | Attending: Radiation Oncology | Admitting: Radiation Oncology

## 2017-09-19 VITALS — BP 150/87 | HR 78 | Temp 96.8°F | Wt 248.3 lb

## 2017-09-19 DIAGNOSIS — J449 Chronic obstructive pulmonary disease, unspecified: Secondary | ICD-10-CM | POA: Diagnosis not present

## 2017-09-19 DIAGNOSIS — E669 Obesity, unspecified: Secondary | ICD-10-CM | POA: Diagnosis not present

## 2017-09-19 DIAGNOSIS — Z79899 Other long term (current) drug therapy: Secondary | ICD-10-CM | POA: Diagnosis not present

## 2017-09-19 DIAGNOSIS — Z8673 Personal history of transient ischemic attack (TIA), and cerebral infarction without residual deficits: Secondary | ICD-10-CM | POA: Diagnosis not present

## 2017-09-19 DIAGNOSIS — N400 Enlarged prostate without lower urinary tract symptoms: Secondary | ICD-10-CM | POA: Diagnosis not present

## 2017-09-19 DIAGNOSIS — K2270 Barrett's esophagus without dysplasia: Secondary | ICD-10-CM | POA: Insufficient documentation

## 2017-09-19 DIAGNOSIS — D649 Anemia, unspecified: Secondary | ICD-10-CM | POA: Diagnosis not present

## 2017-09-19 DIAGNOSIS — Z7982 Long term (current) use of aspirin: Secondary | ICD-10-CM | POA: Insufficient documentation

## 2017-09-19 DIAGNOSIS — C61 Malignant neoplasm of prostate: Secondary | ICD-10-CM

## 2017-09-19 DIAGNOSIS — N189 Chronic kidney disease, unspecified: Secondary | ICD-10-CM | POA: Diagnosis not present

## 2017-09-19 DIAGNOSIS — E538 Deficiency of other specified B group vitamins: Secondary | ICD-10-CM | POA: Insufficient documentation

## 2017-09-19 DIAGNOSIS — E119 Type 2 diabetes mellitus without complications: Secondary | ICD-10-CM | POA: Insufficient documentation

## 2017-09-19 DIAGNOSIS — I10 Essential (primary) hypertension: Secondary | ICD-10-CM | POA: Diagnosis not present

## 2017-09-19 DIAGNOSIS — F329 Major depressive disorder, single episode, unspecified: Secondary | ICD-10-CM | POA: Diagnosis not present

## 2017-09-19 DIAGNOSIS — Z87442 Personal history of urinary calculi: Secondary | ICD-10-CM | POA: Diagnosis not present

## 2017-09-19 DIAGNOSIS — Z87891 Personal history of nicotine dependence: Secondary | ICD-10-CM | POA: Diagnosis not present

## 2017-09-19 DIAGNOSIS — E785 Hyperlipidemia, unspecified: Secondary | ICD-10-CM | POA: Insufficient documentation

## 2017-09-19 NOTE — Consult Note (Signed)
NEW PATIENT EVALUATION  Name: Taylor Austin  MRN: [id number]  Date:   09/19/2017     DOB: [birth year]   This 77 y.o. male patient presents to the clinic for initial evaluation ofs tage IIa (T1 CN 0 M0).Gleason 7 (4+3) adenocarcinoma the prostate  REFERRING PHYSICIAN: Juluis Pitch, MD  CHIEF COMPLAINT:  Chief Complaint  Patient presents with  . Prostate Cancer    Initial eval    DIAGNOSIS: The encounter diagnosis was Malignant neoplasm of prostate ([place]).   PREVIOUS INVESTIGATIONS:  Pathology report reviewed Clinical notes reviewed CT scan of abdomen and pelvis reviewed  HPI: patient is a 77 year old male who is been monitored for rising PSA which on finasteride climbed to 3.3 prompting a transrectal ultrasound-guided biopsy. 2 cores out of 12 showed Gleason 7 (4+3) in 1 core and 3+3 in another core.patient has multiple comorbidities including carotid stent placement obesity. He also has significant urgency and frequency. He's been on multiple medications and is being considered for a TURP. He is not thought to be a surgical candidate based on his age body habitus and comorbidities.  PLANNED TREATMENT REGIMEN: image guided external beam radiation therapy  PAST MEDICAL HISTORY:  has a past medical history of Allergy, Anemia, Arthritis, Back pain, Barrett esophagus, BPH (benign prostatic hyperplasia), Chronic kidney disease, COPD (chronic obstructive pulmonary disease) ([place]), Coronary artery disease, Depression, Diabetes mellitus without complication ([place]), Diverticulitis, Dysphagia, Esophageal reflux, Esophageal reflux, GERD (gastroesophageal reflux disease), Heart murmur, Hyperlipidemia, Hypersomnia, Hypertension, Kidney stones, Low testosterone, OAB (overactive bladder), Obesity, Presence of dental bridge, Rheumatic fever, Sleep apnea, Stroke ([place]) (01/2016), Vertigo, and Vitamin B12 deficiency.    PAST SURGICAL HISTORY:  Past Surgical History:  Procedure Laterality Date  .  BACK SURGERY    . CARDIAC CATHETERIZATION  08/11/2014   Procedure: Left Heart Cath and Coronary Angiography;  Surgeon: Teodoro Spray, MD;  Location: [facility];  Service: Cardiovascular;;  . CARDIAC CATHETERIZATION N/A 11/17/2014   Procedure: Right Heart Cath;  Surgeon: Teodoro Spray, MD;  Location: [facility];  Service: Cardiovascular;  Laterality: N/A;  . CAROTID STENT    . CATARACT EXTRACTION W/PHACO Left 04/03/2016   Procedure: CATARACT EXTRACTION PHACO AND INTRAOCULAR LENS PLACEMENT ([place]) left diabetic;  Surgeon: Eulogio Bear, MD;  Location: [facility];  Service: Ophthalmology;  Laterality: Left;  diabetic - oral meds sleep apnea  . CATARACT EXTRACTION W/PHACO Right 05/01/2016   Procedure: CATARACT EXTRACTION PHACO AND INTRAOCULAR LENS PLACEMENT ([place]) Right diabetic;  Surgeon: Eulogio Bear, MD;  Location: [facility];  Service: Ophthalmology;  Laterality: Right;  Diabetic oral meds sleep apnea  . CERVICAL SPINE SURGERY    . COLONOSCOPY    . COLONOSCOPY WITH PROPOFOL N/A 06/10/2015   Procedure: COLONOSCOPY WITH PROPOFOL;  Surgeon: Manya Silvas, MD;  [facility];  Service: Endoscopy;  Laterality: N/A;  . CORONARY ANGIOPLASTY    . ESOPHAGOGASTRODUODENOSCOPY (EGD) WITH PROPOFOL N/A 06/28/2017   Procedure: ESOPHAGOGASTRODUODENOSCOPY (EGD) WITH PROPOFOL;  Surgeon: Manya Silvas, MD;  Location: [facility] ENDOSCOPY;  Service: Endoscopy;  Laterality: N/A;  . ESOPHAGOGASTRODUODENOSCOPY ENDOSCOPY    . EYE SURGERY    . LUMBAR SPINE SURGERY    . THORACIC SPINE SURGERY    . TONSILLECTOMY      FAMILY HISTORY: family history includes Anesthesia problems in his father and mother; Prostate cancer in his brother; Stroke in his father.  SOCIAL HISTORY:  reports that he quit smoking about 44 years ago. His  smoking use included cigarettes. He has never used smokeless tobacco. He reports that he drinks about 1.0 standard drinks of alcohol per week.  He reports that he does not use drugs.  ALLERGIES: Hydrocodone and Oxycodone-acetaminophen  MEDICATIONS:  Current Outpatient Medications  Medication Sig Dispense Refill  . ACCU-CHEK AVIVA PLUS test strip USE STRIP TO CHECK GLUCOSE ONCE DAILY  0  . albuterol (PROVENTIL HFA;VENTOLIN HFA) 108 (90 Base) MCG/ACT inhaler Inhale into the lungs every 6 (six) hours as needed for wheezing or shortness of breath.    Marland Kitchen alum & mag hydroxide-simeth (MAALOX PLUS) 400-400-40 MG/5ML suspension Take by mouth every 6 (six) hours as needed for indigestion.    Marland Kitchen aspirin EC 81 MG tablet Take 81 mg by mouth daily.    Marland Kitchen aspirin-acetaminophen-caffeine (EXCEDRIN MIGRAINE) 250-250-65 MG tablet Take by mouth every 6 (six) hours as needed for headache.    Marland Kitchen atorvastatin (LIPITOR) 40 MG tablet TAKE 1 TABLET BY MOUTH ONCE DAILY    . Blood Glucose Monitoring Suppl (FIFTY50 GLUCOSE METER 2.0) w/Device KIT Use as directed    . clopidogrel (PLAVIX) 75 MG tablet Take 1 tablet (75 mg total) by mouth daily. 30 tablet 0  . Cyanocobalamin (VITAMIN B-12 PO) Take by mouth.    . donepezil (ARICEPT) 10 MG tablet Take by mouth.    . Exenatide ER (BYDUREON) 2 MG PEN Inject into the skin.    . finasteride (PROSCAR) 5 MG tablet TAKE ONE TABLET BY MOUTH ONCE DAILY 90 tablet 4  . glyBURIDE-metformin (GLUCOVANCE) 5-500 MG per tablet TAKE ONE TABLET BY MOUTH TWICE DAILY WITH MEALS    . losartan (COZAAR) 100 MG tablet Take 100 mg by mouth daily.    . magnesium oxide (MAG-OX) 400 MG tablet Take 400 mg by mouth 2 (two) times daily.     . meclizine (ANTIVERT) 12.5 MG tablet Take 12.5 mg by mouth 3 (three) times daily as needed for dizziness.    . Multiple Vitamin (MULTI-VITAMINS) TABS Take 1 tablet by mouth daily.     Marland Kitchen oxymetazoline (AFRIN) 0.05 % nasal spray Place 1 spray into both nostrils 2 (two) times daily.    . pantoprazole (PROTONIX) 40 MG tablet Take by mouth.    . pioglitazone (ACTOS) 45 MG tablet Take 45 mg by mouth daily.   1  .  Probiotic Product (PROBIOTIC PO) Take by mouth.    . simvastatin (ZOCOR) 40 MG tablet Take 40 mg by mouth daily at 6 PM.   5  . umeclidinium-vilanterol (ANORO ELLIPTA) 62.5-25 MCG/INH AEPB Inhale 1 puff into the lungs daily.     No current facility-administered medications for this encounter.     ECOG PERFORMANCE STATUS:  1 - Symptomatic but completely ambulatory  REVIEW OF SYSTEMS: except for the urinary symptoms proximal to 40 pound weight loss over the past several months erectile dysfunction Patient denies any weight loss, fatigue, weakness, fever, chills or night sweats. Patient denies any loss of vision, blurred vision. Patient denies any ringing  of the ears or hearing loss. No irregular heartbeat. Patient denies heart murmur or history of fainting. Patient denies any chest pain or pain radiating to her upper extremities. Patient denies any shortness of breath, difficulty breathing at night, cough or hemoptysis. Patient denies any swelling in the lower legs. Patient denies any nausea vomiting, vomiting of blood, or coffee ground material in the vomitus. Patient denies any stomach pain. Patient states has had normal bowel movements no significant constipation or diarrhea. Patient denies  any dysuria, hematuria or significant nocturia. Patient denies any problems walking, swelling in the joints or loss of balance. Patient denies any skin changes, loss of hair or loss of weight. Patient denies any excessive worrying or anxiety or significant depression. Patient denies any problems with insomnia. Patient denies excessive thirst, polyuria, polydipsia. Patient denies any swollen glands, patient denies easy bruising or easy bleeding. Patient denies any recent infections, allergies or URI. Patient "s visual fields have not changed significantly in recent time.    PHYSICAL EXAM: BP (!) 150/87 (BP Location: Left Arm, Patient Position: Sitting)   Pulse 78   Temp (!) 96.8 F (36 C) (Tympanic)   Wt 248 lb  5.6 oz (112.6 kg)   BMI 40.08 kg/m  On rectal exam rectal sphincter tone is good prostate is smooth without evidence of discrete nodularity sulcus is preserved bilaterally. No other rectal abnormalities identified.Well-developed well-nourished patient in NAD. HEENT reveals PERLA, EOMI, discs not visualized.  Oral cavity is clear. No oral mucosal lesions are identified. Neck is clear without evidence of cervical or supraclavicular adenopathy. Lungs are clear to A&P. Cardiac examination is essentially unremarkable with regular rate and rhythm without murmur rub or thrill. Abdomen is benign with no organomegaly or masses noted. Motor sensory and DTR levels are equal and symmetric in the upper and lower extremities. Cranial nerves II through XII are grossly intact. Proprioception is intact. No peripheral adenopathy or edema is identified. No motor or sensory levels are noted. Crude visual fields are within normal range.  LABORATORY DATA: pathology reports reviewed    RADIOLOGY RESULTS:CT scan of abdomen and pelvis from April 2019 reviewed showing no evidence to suggest extracapsular extension or pelvic lymph node involvement of his prostate cancer   IMPRESSION: stage IIa Gleason 7 (4+3) adenocarcinoma the prostate in 77 year old male  PLAN: this time I recommended external beam I MRT radiation therapy. Patient may be having a TURP and we will delay treatment until that area is well-healed. I would like fiduciary markers placed in his prostate for daily image guided treatment and has been arranged with Dr. Cherrie Gauze office. I would plan on delivering 8000 cGy to his prostate over 8 weeks. Risks and benefits of treatment and including exacerbation of his already present lower urinary tract symptoms diarrhea fatigue alteration of blood counts and skin reaction all were discussed in detail with the patient and his wife. There is also question of possible treating with androgen deprivation therapy for 6 months  and that will be discussed with urology. I've tentatively set him up for treatment planning in the beginning of October he is a rescheduled for evaluation for TURP at [facility] urology.  I would like to take this opportunity to thank you for allowing me to participate in the care of your patient.Noreene Filbert, MD

## 2017-09-23 ENCOUNTER — Telehealth: Payer: Self-pay | Admitting: Urology

## 2017-09-23 NOTE — Telephone Encounter (Signed)
Pt's wife called today to make appt with you after urodynamics at [facility] scheduled for 9/27.  Pt has appt w/[facility] for gold seed markers on 10/4.  You don't have any openings before then.  Should I make pt appt on your next available?  Please advise.

## 2017-09-23 NOTE — Telephone Encounter (Signed)
This patient should not have been put on [facility] schedule. Please try to work him in October 1in the afternoon (3:45)  Hollice Espy, MD

## 2017-10-01 ENCOUNTER — Other Ambulatory Visit: Payer: Medicare Other | Admitting: Urology

## 2017-10-08 ENCOUNTER — Ambulatory Visit (INDEPENDENT_AMBULATORY_CARE_PROVIDER_SITE_OTHER): Payer: Medicare Other | Admitting: Urology

## 2017-10-08 ENCOUNTER — Other Ambulatory Visit: Payer: Self-pay | Admitting: Urology

## 2017-10-08 ENCOUNTER — Encounter: Payer: Self-pay | Admitting: Urology

## 2017-10-08 VITALS — BP 123/73 | HR 60 | Ht 66.0 in

## 2017-10-08 DIAGNOSIS — C61 Malignant neoplasm of prostate: Secondary | ICD-10-CM

## 2017-10-08 DIAGNOSIS — N398 Other specified disorders of urinary system: Secondary | ICD-10-CM

## 2017-10-08 DIAGNOSIS — N3281 Overactive bladder: Secondary | ICD-10-CM | POA: Diagnosis not present

## 2017-10-08 MED ORDER — GENTAMICIN SULFATE 40 MG/ML IJ SOLN
80.0000 mg | Freq: Once | INTRAMUSCULAR | Status: AC
Start: 2017-10-08 — End: 2017-10-08
  Administered 2017-10-08: 80 mg via INTRAMUSCULAR

## 2017-10-08 MED ORDER — LEVOFLOXACIN 500 MG PO TABS
500.0000 mg | ORAL_TABLET | Freq: Once | ORAL | Status: AC
  Administered 2017-10-08: 500 mg via ORAL

## 2017-10-08 NOTE — Progress Notes (Signed)
10/08/2017 7:55 PM   [name] [birth year] [id number]  Referring provider: Juluis Pitch, MD [phone] [place], [place] [id number]  Chief Complaint  Patient presents with  . Gold Seed Placement    HPI: 77 year old male with intermediate Gleason 4+3 prostate cancer scheduled to begin IMRT who presents today for gold seed implantation as well as review of his urodynamic study.  He has poorly controlled urinary symptoms with both irritative and obstructive voiding symptoms.  He reports this been going on for many years.  He tried multiple medications including finasteride and Myrbetriq without any improvement.  He is unable to tolerate anticholinergics due to memory issues and constipation.  He also failed posterior nerve stimulation.  He underwent cystoscopy 01/2017 with 3 to 4 cm which was somewhat obstructive without a significant median lobe or bladder pathology.  TRUS volume 29 cc.  Since his last visit, he underwent urodynamics to assess the role of an outlet procedure.  Urodynamics indicated for sensation at 396 mL's, unclear whether this is the first sensation of bladder fullness or the desire to void.  He had a strong desire to void at 495 cc.  He had urgency and bladder instability which was unable to be suppressed.  On the pressure flow study, he was able to void 226 mL's, had a flow rate of 12 mL's per second.  His max detrusor pressure at peak flow was 34 cm of water.  His PVR was 33 cc.  During the study, there was significant activity of the surface electrode leads during voiding. Trabeculation was appreciated. BOOI was primarily the unobstructive and borderline equivocal range.  No clear obstruction.   PMH: Past Medical History:  Diagnosis Date  . Allergy   . Anemia   . Arthritis   . Back pain   . Barrett esophagus   . BPH (benign prostatic hyperplasia)   . Chronic kidney disease   . COPD (chronic obstructive pulmonary disease) ([place])   . Coronary artery  disease   . Depression   . Diabetes mellitus without complication ([place])   . Diverticulitis   . Dysphagia   . Esophageal reflux   . Esophageal reflux   . GERD (gastroesophageal reflux disease)   . Heart murmur   . Hyperlipidemia   . Hypersomnia   . Hypertension   . Kidney stones   . Low testosterone   . OAB (overactive bladder)   . Obesity   . Presence of dental bridge    2 - top  . Rheumatic fever   . Sleep apnea    BiPAP  . Stroke ([place]) 01/2016  . Vertigo   . Vitamin B12 deficiency     Surgical History: Past Surgical History:  Procedure Laterality Date  . BACK SURGERY    . CARDIAC CATHETERIZATION  08/11/2014   Procedure: Left Heart Cath and Coronary Angiography;  Surgeon: Teodoro Spray, MD;  Location: [place] [facility];  Service: Cardiovascular;;  . CARDIAC CATHETERIZATION N/A 11/17/2014   Procedure: Right Heart Cath;  Surgeon: Teodoro Spray, MD;  Location: [place] [facility];  Service: Cardiovascular;  Laterality: N/A;  . CAROTID STENT    . CATARACT EXTRACTION W/PHACO Left 04/03/2016   Procedure: CATARACT EXTRACTION PHACO AND INTRAOCULAR LENS PLACEMENT ([place]) left diabetic;  Surgeon: Eulogio Bear, MD;  Location: [place];  Service: Ophthalmology;  Laterality: Left;  diabetic - oral meds sleep apnea  . CATARACT EXTRACTION W/PHACO Right 05/01/2016   Procedure: CATARACT EXTRACTION PHACO  AND INTRAOCULAR LENS PLACEMENT ([facility]) Right diabetic;  Surgeon: Eulogio Bear, MD;  Location: [place];  Service: Ophthalmology;  Laterality: Right;  Diabetic oral meds sleep apnea  . CERVICAL SPINE SURGERY    . COLONOSCOPY    . COLONOSCOPY WITH PROPOFOL N/A 06/10/2015   Procedure: COLONOSCOPY WITH PROPOFOL;  Surgeon: Manya Silvas, MD;  Location[facility];  Service: Endoscopy;  Laterality: N/A;  . CORONARY ANGIOPLASTY    . ESOPHAGOGASTRODUODENOSCOPY (EGD) WITH PROPOFOL N/A 06/28/2017   Procedure: ESOPHAGOGASTRODUODENOSCOPY (EGD) WITH PROPOFOL;   Surgeon: Manya Silvas, MD;  Location: [facility] ENDOSCOPY;  Service: Endoscopy;  Laterality: N/A;  . ESOPHAGOGASTRODUODENOSCOPY ENDOSCOPY    . EYE SURGERY    . LUMBAR SPINE SURGERY    . THORACIC SPINE SURGERY    . TONSILLECTOMY      Home Medications:  Allergies as of 10/08/2017      Reactions   Hydrocodone Shortness Of Breath   Oxycodone-acetaminophen Shortness Of Breath      Medication List        Accurate as of 10/08/17 11:59 PM. Always use your most recent med list.          ACCU-CHEK AVIVA PLUS test strip Generic drug:  glucose blood USE STRIP TO CHECK GLUCOSE ONCE DAILY   albuterol 108 (90 Base) MCG/ACT inhaler Commonly known as:  PROVENTIL HFA;VENTOLIN HFA Inhale into the lungs every 6 (six) hours as needed for wheezing or shortness of breath.   alum & mag hydroxide-simeth 631-497-02 MG/5ML suspension Commonly known as:  MAALOX PLUS Take by mouth every 6 (six) hours as needed for indigestion.   ANORO ELLIPTA 62.5-25 MCG/INH Aepb Generic drug:  umeclidinium-vilanterol Inhale 1 puff into the lungs daily.   aspirin EC 81 MG tablet Take 81 mg by mouth daily.   aspirin-acetaminophen-caffeine 637-858-85 MG tablet Commonly known as:  EXCEDRIN MIGRAINE Take by mouth every 6 (six) hours as needed for headache.   atorvastatin 40 MG tablet Commonly known as:  LIPITOR TAKE 1 TABLET BY MOUTH ONCE DAILY   BYDUREON 2 MG Pen Generic drug:  Exenatide ER Inject into the skin.   clopidogrel 75 MG tablet Commonly known as:  PLAVIX Take 1 tablet (75 mg total) by mouth daily.   donepezil 10 MG tablet Commonly known as:  ARICEPT Take by mouth.   FIFTY50 GLUCOSE METER 2.0 w/Device Kit Use as directed   finasteride 5 MG tablet Commonly known as:  PROSCAR TAKE ONE TABLET BY MOUTH ONCE DAILY   glyBURIDE-metformin 5-500 MG tablet Commonly known as:  GLUCOVANCE TAKE ONE TABLET BY MOUTH TWICE DAILY WITH MEALS   losartan 100 MG tablet Commonly known as:  COZAAR Take  100 mg by mouth daily.   magnesium oxide 400 MG tablet Commonly known as:  MAG-OX Take 400 mg by mouth 2 (two) times daily.   meclizine 12.5 MG tablet Commonly known as:  ANTIVERT Take 12.5 mg by mouth 3 (three) times daily as needed for dizziness.   MULTI-VITAMINS Tabs Take 1 tablet by mouth daily.   oxymetazoline 0.05 % nasal spray Commonly known as:  AFRIN Place 1 spray into both nostrils 2 (two) times daily.   pantoprazole 40 MG tablet Commonly known as:  PROTONIX Take by mouth.   pioglitazone 45 MG tablet Commonly known as:  ACTOS Take 45 mg by mouth daily.   PROBIOTIC PO Take by mouth.   simvastatin 40 MG tablet Commonly known as:  ZOCOR Take 40 mg by mouth daily at 6  PM.   VITAMIN B-12 PO Take by mouth.       Allergies:  Allergies  Allergen Reactions  . Hydrocodone Shortness Of Breath  . Oxycodone-Acetaminophen Shortness Of Breath    Family History: Family History  Problem Relation Age of Onset  . Stroke Father   . Anesthesia problems Father   . Anesthesia problems Mother   . Prostate cancer Brother   . Kidney disease Neg Hx   . Bladder Cancer Neg Hx     Social History:  reports that he quit smoking about 44 years ago. His smoking use included cigarettes. He has never used smokeless tobacco. He reports that he drinks about 1.0 standard drinks of alcohol per week. He reports that he does not use drugs.  Physical Exam: BP 123/73   Pulse 60   Ht _0  (1.676 m)   BMI 40.08 kg/m   Constitutional:  Alert and oriented, No acute distress.  Company by wife today. HEENT: [place] AT, moist mucus membranes.  Trachea midline, no masses. Cardiovascular: No clubbing, cyanosis, or edema. Respiratory: Normal respiratory effort, no increased work of breathing. Rectal: Normal sphincter tone. Skin: No rashes, bruises or suspicious lesions. Neurologic: Grossly intact, no focal deficits, moving all 4 extremities. Psychiatric: Normal mood and affect.  Laboratory  Data: Lab Results  Component Value Date   WBC 6.4 02/02/2016   HGB 14.1 02/02/2016   HCT 40.1 02/02/2016   MCV 91.4 02/02/2016   PLT 158 02/02/2016    Lab Results  Component Value Date   CREATININE 1.05 02/02/2016    Lab Results  Component Value Date   HGBA1C 6.6 (H) 01/31/2016    Urinalysis NA  Prostate Gold Seed Marker Placement Procedure   Informed consent was obtained after discussing risks/benefits of the procedure.  A time out was performed to ensure correct patient identity.  Pre-Procedure: - Gentamicin given prophylactically - PO Levaquin 500 mg also given today  Procedure: -Lidocaine jelly was administered per rectum -Rectal ultrasound probe was placed without difficulty and the prostate visualized - 3 fiducial gold seed markers placed, one at right base, one at left base, one at apex of prostate gland under transrectal ultrasound guidance  Post-Procedure: - Patient tolerated the procedure well - He was counseled to seek immediate medical attention if experiences any severe pain, significant bleeding, or fevers  Assessment & Plan:    1. Prostate cancer [facility]) Status post uncomplicated placement of brachytherapy seeds today Reviewed warning symptoms and possible anticipated side effects All questions answered Although we have discussed ADT in the past, he did not recall having this conversation.  We reviewed the risk and benefits again at length today and handout information was given again.  He will let us know if he like to pursue 6 months of ADT as adjuvant to his radiation treatment as recommended by AUA/NCCN guideline.   - gentamicin (GARAMYCIN) injection 80 mg - levofloxacin (LEVAQUIN) tablet 500 mg  2. OAB (overactive bladder) Refractory overactivity symptoms, little bit from previous intervention including Myrbetriq, anticholinergics and PTNS May consider Botox down the road for instability once he is completed his radiation Patient is agreeable  we will address this again in the future  3. Dysfunctional voiding of urine Addendum x-ray results reviewed at length today with the patient He does have symptoms of weak stream but does not have significant evidence of outlet obstruction thus would have limited benefit from TURP Given the presence of EMG stimulation with active voiding, am fairly certain that he  has voiding dysfunction and inability to relax his pelvic floor during micturition We discussed that he would likely benefit from physical therapy He is agreeable this plan is agreeable to this referral - Ambulatory referral to Physical Therapy   Return in about 4 months (around 02/08/2018) for recheck urinary symptoms, possible lupron next week.  Hollice Espy, MD  [facility] [address], [place], [place] [address] [phone]  I spent 25 min with this patient of which greater than 50% was spent in counseling and coordination of care with the patient.   This was in addition to placement of his brachytherapy seeds which was a separate procedure today.

## 2017-10-08 NOTE — Patient Instructions (Signed)

## 2017-10-11 ENCOUNTER — Other Ambulatory Visit: Payer: Medicare Other | Admitting: Urology

## 2017-10-15 ENCOUNTER — Ambulatory Visit
Admission: RE | Admit: 2017-10-15 | Discharge: 2017-10-15 | Disposition: A | Discharge status: Home / Self Care | Payer: Medicare Other | Source: Ambulatory Visit | Attending: Radiation Oncology | Admitting: Radiation Oncology

## 2017-10-15 DIAGNOSIS — C61 Malignant neoplasm of prostate: Secondary | ICD-10-CM | POA: Diagnosis present

## 2017-10-15 DIAGNOSIS — Z510 Encounter for antineoplastic radiation therapy: Secondary | ICD-10-CM | POA: Diagnosis not present

## 2017-10-16 DIAGNOSIS — Z510 Encounter for antineoplastic radiation therapy: Secondary | ICD-10-CM | POA: Diagnosis not present

## 2017-10-22 ENCOUNTER — Ambulatory Visit: Payer: Medicare Other | Admitting: Urology

## 2017-10-22 ENCOUNTER — Ambulatory Visit: Payer: Medicare Other | Admitting: Physical Therapy

## 2017-10-24 ENCOUNTER — Ambulatory Visit
Admission: RE | Admit: 2017-10-24 | Discharge: 2017-10-24 | Disposition: A | Discharge status: Home / Self Care | Payer: Medicare Other | Source: Ambulatory Visit | Attending: Radiation Oncology | Admitting: Radiation Oncology

## 2017-10-25 ENCOUNTER — Other Ambulatory Visit: Payer: Self-pay | Admitting: *Deleted

## 2017-10-25 DIAGNOSIS — C61 Malignant neoplasm of prostate: Secondary | ICD-10-CM

## 2017-10-28 ENCOUNTER — Other Ambulatory Visit: Payer: Self-pay

## 2017-10-28 ENCOUNTER — Emergency Department: Payer: Medicare Other

## 2017-10-28 ENCOUNTER — Ambulatory Visit
Admission: RE | Admit: 2017-10-28 | Discharge: 2017-10-28 | Disposition: A | Discharge status: Home / Self Care | Payer: Medicare Other | Source: Ambulatory Visit | Attending: Radiation Oncology | Admitting: Radiation Oncology

## 2017-10-28 ENCOUNTER — Emergency Department
Admission: EM | Admit: 2017-10-28 | Discharge: 2017-10-28 | Disposition: A | Discharge status: Home / Self Care | Payer: Medicare Other | Attending: Emergency Medicine | Admitting: Emergency Medicine

## 2017-10-28 DIAGNOSIS — N189 Chronic kidney disease, unspecified: Secondary | ICD-10-CM | POA: Insufficient documentation

## 2017-10-28 DIAGNOSIS — Z7901 Long term (current) use of anticoagulants: Secondary | ICD-10-CM | POA: Diagnosis not present

## 2017-10-28 DIAGNOSIS — I2510 Atherosclerotic heart disease of native coronary artery without angina pectoris: Secondary | ICD-10-CM | POA: Diagnosis not present

## 2017-10-28 DIAGNOSIS — Y999 Unspecified external cause status: Secondary | ICD-10-CM | POA: Diagnosis not present

## 2017-10-28 DIAGNOSIS — Y939 Activity, unspecified: Secondary | ICD-10-CM | POA: Diagnosis not present

## 2017-10-28 DIAGNOSIS — I129 Hypertensive chronic kidney disease with stage 1 through stage 4 chronic kidney disease, or unspecified chronic kidney disease: Secondary | ICD-10-CM | POA: Insufficient documentation

## 2017-10-28 DIAGNOSIS — S20212A Contusion of left front wall of thorax, initial encounter: Secondary | ICD-10-CM | POA: Insufficient documentation

## 2017-10-28 DIAGNOSIS — E1122 Type 2 diabetes mellitus with diabetic chronic kidney disease: Secondary | ICD-10-CM | POA: Diagnosis not present

## 2017-10-28 DIAGNOSIS — Y929 Unspecified place or not applicable: Secondary | ICD-10-CM | POA: Diagnosis not present

## 2017-10-28 DIAGNOSIS — J449 Chronic obstructive pulmonary disease, unspecified: Secondary | ICD-10-CM | POA: Insufficient documentation

## 2017-10-28 DIAGNOSIS — X58XXXA Exposure to other specified factors, initial encounter: Secondary | ICD-10-CM | POA: Diagnosis not present

## 2017-10-28 DIAGNOSIS — Z87891 Personal history of nicotine dependence: Secondary | ICD-10-CM | POA: Insufficient documentation

## 2017-10-28 DIAGNOSIS — Z79899 Other long term (current) drug therapy: Secondary | ICD-10-CM | POA: Diagnosis not present

## 2017-10-28 DIAGNOSIS — S20302A Unspecified superficial injuries of left front wall of thorax, initial encounter: Secondary | ICD-10-CM | POA: Diagnosis present

## 2017-10-28 DIAGNOSIS — Z510 Encounter for antineoplastic radiation therapy: Secondary | ICD-10-CM | POA: Diagnosis not present

## 2017-10-28 DIAGNOSIS — Z7984 Long term (current) use of oral hypoglycemic drugs: Secondary | ICD-10-CM | POA: Insufficient documentation

## 2017-10-28 MED ORDER — ACETAMINOPHEN 325 MG PO TABS
650.0000 mg | ORAL_TABLET | Freq: Once | ORAL | Status: AC
  Administered 2017-10-28: 650 mg via ORAL
  Filled 2017-10-28: qty 2

## 2017-10-28 MED ORDER — METHYLPREDNISOLONE 4 MG PO TBPK: ORAL_TABLET | ORAL | 0 refills | Status: DC

## 2017-10-28 NOTE — ED Triage Notes (Signed)
Pt states Friday he bent leaning over console and felt immediate pain to left rib area. STates something "crunched", worse when he takes a deep breath or moves. Has pinpoint tenderness to area.

## 2017-10-28 NOTE — ED Provider Notes (Signed)
[facility] Emergency Department Provider Note  ____________________________________________   First MD Initiated Contact with Patient 10/28/17 2021     (approximate)  I have reviewed the triage vital signs and the nursing notes.   HISTORY  Chief Complaint Rib Injury    HPI Taylor Austin is a 77 y.o. male presents emergency department stating that he would lean over a consult and felt a crack or snap in his left rib area.  He states that it felt like something crunch like cartilage.  He has been having pain since Friday.  States is been worsening return takes a deep breath.  He denies fever or chills.  Denies chest pain.  He denies any swelling in the extremities.  He states his pain is strictly musculoskeletal.    Past Medical History:  Diagnosis Date  . Allergy   . Anemia   . Arthritis   . Back pain   . Barrett esophagus   . BPH (benign prostatic hyperplasia)   . Chronic kidney disease   . COPD (chronic obstructive pulmonary disease) ([place])   . Coronary artery disease   . Depression   . Diabetes mellitus without complication ([place])   . Diverticulitis   . Dysphagia   . Esophageal reflux   . Esophageal reflux   . GERD (gastroesophageal reflux disease)   . Heart murmur   . Hyperlipidemia   . Hypersomnia   . Hypertension   . Kidney stones   . Low testosterone   . OAB (overactive bladder)   . Obesity   . Presence of dental bridge    2 - top  . Rheumatic fever   . Sleep apnea    BiPAP  . Stroke ([place]) 01/2016  . Vertigo   . Vitamin B12 deficiency     Patient Active Problem List   Diagnosis Date Noted  . Acute cerebrovascular accident (CVA) ([place]) 01/30/2016  . Chronic kidney disease 07/14/2014  . Clinical depression 07/14/2014  . Gait instability 05/05/2014  . Arthritis of knee 07/03/2013  . Arteriosclerosis of coronary artery 07/03/2013  . Diabetes mellitus, type 2 ([place]) 07/03/2013  . Benign essential HTN 07/03/2013  . HLD  (hyperlipidemia) 07/03/2013  . Breath shortness 07/03/2013  . Apnea, sleep 06/18/2013    Past Surgical History:  Procedure Laterality Date  . BACK SURGERY    . CARDIAC CATHETERIZATION  08/11/2014   Procedure: Left Heart Cath and Coronary Angiography;  Surgeon: Teodoro Spray, MD;  Location: [place] [facility];  Service: Cardiovascular;;  . CARDIAC CATHETERIZATION N/A 11/17/2014   Procedure: Right Heart Cath;  Surgeon: Teodoro Spray, MD;  Location: [place] [facility];  Service: Cardiovascular;  Laterality: N/A;  . CAROTID STENT    . CATARACT EXTRACTION W/PHACO Left 04/03/2016   Procedure: CATARACT EXTRACTION PHACO AND INTRAOCULAR LENS PLACEMENT ([place]) left diabetic;  Surgeon: Eulogio Bear, MD;  Location: [place];  Service: Ophthalmology;  Laterality: Left;  diabetic - oral meds sleep apnea  . CATARACT EXTRACTION W/PHACO Right 05/01/2016   Procedure: CATARACT EXTRACTION PHACO AND INTRAOCULAR LENS PLACEMENT ([place]) Right diabetic;  Surgeon: Eulogio Bear, MD;  Location: [place];  Service: Ophthalmology;  Laterality: Right;  Diabetic oral meds sleep apnea  . CERVICAL SPINE SURGERY    . COLONOSCOPY    . COLONOSCOPY WITH PROPOFOL N/A 06/10/2015   Procedure: COLONOSCOPY WITH PROPOFOL;  Surgeon: Manya Silvas, MD;  Location[facility] ENDOSCOPY;  Service: Endoscopy;  Laterality: N/A;  . CORONARY  ANGIOPLASTY    . ESOPHAGOGASTRODUODENOSCOPY (EGD) WITH PROPOFOL N/A 06/28/2017   Procedure: ESOPHAGOGASTRODUODENOSCOPY (EGD) WITH PROPOFOL;  Surgeon: Manya Silvas, MD;  Location: [facility] ENDOSCOPY;  Service: Endoscopy;  Laterality: N/A;  . ESOPHAGOGASTRODUODENOSCOPY ENDOSCOPY    . EYE SURGERY    . LUMBAR SPINE SURGERY    . THORACIC SPINE SURGERY    . TONSILLECTOMY      Prior to Admission medications   Medication Sig Start Date End Date Taking? Authorizing Provider  gabapentin (NEURONTIN) 100 MG capsule Take 100 mg by mouth 3 (three) times daily.   Yes [provider]  glimepiride (AMARYL) 2 MG tablet Take 2 mg by mouth daily with breakfast.   Yes [provider]  ACCU-CHEK AVIVA PLUS test strip USE STRIP TO [place] 06/19/17   [provider]  albuterol (PROVENTIL HFA;VENTOLIN HFA) 108 (90 Base) MCG/ACT inhaler Inhale into the lungs every 6 (six) hours as needed for wheezing or shortness of breath.    [provider]  alum & mag hydroxide-simeth (MAALOX PLUS) 400-400-40 MG/5ML suspension Take by mouth every 6 (six) hours as needed for indigestion.    [provider]  aspirin EC 81 MG tablet Take 81 mg by mouth daily.    [provider]  aspirin-acetaminophen-caffeine (EXCEDRIN MIGRAINE) [id number] MG tablet Take by mouth every 6 (six) hours as needed for headache.    [provider]  atorvastatin (LIPITOR) 40 MG tablet TAKE 1 TABLET BY MOUTH ONCE DAILY 04/29/17   [provider]  Blood Glucose Monitoring Suppl (FIFTY50 GLUCOSE METER 2.0) w/Device KIT Use as directed 06/19/17 06/19/18  [provider]  clopidogrel (PLAVIX) 75 MG tablet Take 1 tablet (75 mg total) by mouth daily. 02/01/16   Hillary Bow, MD  Cyanocobalamin (VITAMIN B-12 PO) Take by mouth.    [provider]  donepezil (ARICEPT) 10 MG tablet Take by mouth. 04/15/17 07/14/17  [provider]  Exenatide ER (BYDUREON) 2 MG PEN Inject into the skin. 03/14/17   [provider]  finasteride (PROSCAR) 5 MG tablet TAKE ONE TABLET BY MOUTH ONCE DAILY 01/01/17   McGowan, Larene [place] A, PA-C  glyBURIDE-metformin (GLUCOVANCE) 5-500 MG per tablet TAKE ONE TABLET BY MOUTH TWICE DAILY WITH MEALS 04/01/14   [provider]  losartan (COZAAR) 100 MG tablet Take 100 mg by mouth daily.    [provider]  magnesium oxide (MAG-OX) 400 MG tablet Take 400 mg by mouth 2 (two) times daily.     [provider]  meclizine (ANTIVERT) 12.5 MG tablet Take 12.5 mg by mouth 3 (three)  times daily as needed for dizziness.    [provider]  methylPREDNISolone (MEDROL DOSEPAK) 4 MG TBPK tablet Take 6 pills on day one then decrease by 1 pill each day 10/28/17   Versie Starks, PA-C  Multiple Vitamin (MULTI-VITAMINS) TABS Take 1 tablet by mouth daily.     [provider]  oxymetazoline (AFRIN) 0.05 % nasal spray Place 1 spray into both nostrils 2 (two) times daily.    [provider]  pantoprazole (PROTONIX) 40 MG tablet Take by mouth. 04/10/17   [provider]  pioglitazone (ACTOS) 45 MG tablet Take 45 mg by mouth daily.  04/16/14   [provider]  Probiotic Product (PROBIOTIC PO) Take by mouth.    [provider]  simvastatin (ZOCOR) 40 MG tablet Take 40 mg by mouth daily at 6 PM.  07/02/14   [provider]  umeclidinium-vilanterol (ANORO ELLIPTA) 62.5-25 MCG/INH AEPB Inhale 1 puff into the lungs daily.    [provider]    Allergies Hydrocodone and Oxycodone-acetaminophen  Family History  Problem Relation Age of Onset  . Stroke Father   . Anesthesia problems Father   . Anesthesia problems Mother   . Prostate cancer Brother   . Kidney disease Neg Hx   . Bladder Cancer Neg Hx     Social History Social History   Tobacco Use  . Smoking status: Former Smoker    Types: Cigarettes    Last attempt to quit: 07/13/1973    Years since quitting: 44.3  . Smokeless tobacco: Never Used  . Tobacco comment: reports he smoked 4 packs a day for 10 years and quit in 1975  Substance Use Topics  . Alcohol use: Yes    Alcohol/week: 1.0 standard drinks    Types: 1 Standard drinks or equivalent per week    Comment: rarely drinks, for holidays  . Drug use: No    Review of Systems  Constitutional: No fever/chills Eyes: No visual changes. ENT: No sore throat. Respiratory: Denies cough Genitourinary: Negative for dysuria. Musculoskeletal: Negative for back pain.  Positive for left rib pain Skin: Negative  for rash.    ____________________________________________   PHYSICAL EXAM:  VITAL SIGNS: ED Triage Vitals [10/28/17 1935]  Enc Vitals Group     BP 139/77     Pulse Rate 80     Resp 20     Temp 97.7 F (36.5 C)     Temp Source Oral     SpO2 99 %     Weight 245 lb (111.1 kg)     Height      Head Circumference      Peak Flow      Pain Score 0     Pain Loc      Pain Edu?      Excl. in [place]?     Constitutional: Alert and oriented. Well appearing and in no acute distress. Eyes: Conjunctivae are normal.  Head: Atraumatic. Nose: No congestion/rhinnorhea. Mouth/Throat: Mucous membranes are moist.   Neck:  supple no lymphadenopathy noted Cardiovascular: Normal rate, regular rhythm. Heart sounds are normal Respiratory: Normal respiratory effort.  No retractions, lungs c t a  Abd: soft nontender bs normal all 4 quad GU: deferred Musculoskeletal: FROM all extremities, warm and well perfused, left ribs are tender to palpation Neurologic:  Normal speech and language.  Skin:  Skin is warm, dry and intact. No rash noted.  No bruising is noted Psychiatric: Mood and affect are normal. Speech and behavior are normal.  ____________________________________________   LABS (all labs ordered are listed, but only abnormal results are displayed)  Labs Reviewed - No data to display ____________________________________________   ____________________________________________  RADIOLOGY  X-ray of the left ribs is negative Chest x-ray is negative  ____________________________________________   PROCEDURES  Procedure(s) performed: No  Procedures    ____________________________________________   INITIAL IMPRESSION / ASSESSMENT AND PLAN / ED COURSE  Pertinent labs & imaging results that were available during my care of the patient were reviewed by me and considered in my medical decision making (see chart for details).   Patient 77 year old male presents emergency department  complaining of left rib pain after leaning against the car console.  He states he heard a lot of crunching.  He is continued to have pain for the past 4 days.  On physical exam patient appears well.  The left ribs  are tender to palpation.  Chest x-ray left rib x-rays are both negative.  Explained the findings to the patient.  He was given a dose of Tylenol while here in the ED.  He was given prescription for Medrol Dosepak.  He is to apply ice to the left ribs.  Take deep breaths even though it is painful to prevent pneumonia.  He is to return to the emergency department if pain is worsening.  He states he understands will comply.  Was discharged in stable condition.     As part of my medical decision making, I reviewed the following data within the [place] History obtained from family, Nursing notes reviewed and incorporated, Old chart reviewed, Radiograph reviewed x-ray of the left ribs and chest are both negative, Notes from prior ED visits and [place] Controlled Substance Database  ____________________________________________   FINAL CLINICAL IMPRESSION(S) / ED DIAGNOSES  Final diagnoses:  Rib contusion, left, initial encounter      NEW MEDICATIONS STARTED DURING THIS VISIT:  Discharge Medication List as of 10/28/2017  9:35 PM    START taking these medications   Details  methylPREDNISolone (MEDROL DOSEPAK) 4 MG TBPK tablet Take 6 pills on day one then decrease by 1 pill each day, Print         Note:  This document was prepared using Dragon voice recognition software and may include unintentional dictation errors.    Versie Starks, PA-C 10/28/17 [id number]    Taylor Listen, MD 10/28/17 2318

## 2017-10-28 NOTE — Discharge Instructions (Addendum)
Follow-up with your regular doctor if not better in 3 to 5 days.  Even though it is painful continue to take deep breaths to keep your lungs clear.  If you do not take deep breaths she will develop pneumonia.  Take Tylenol for pain as needed.  The steroid is to decrease inflammation along the cartilage.  Apply ice to the left ribs as this will also help with inflammation.  Return to the emergency department if you are worsening.

## 2017-10-29 ENCOUNTER — Ambulatory Visit
Admission: RE | Admit: 2017-10-29 | Discharge: 2017-10-29 | Disposition: A | Discharge status: Home / Self Care | Payer: Medicare Other | Source: Ambulatory Visit | Attending: Radiation Oncology | Admitting: Radiation Oncology

## 2017-10-29 DIAGNOSIS — Z510 Encounter for antineoplastic radiation therapy: Secondary | ICD-10-CM | POA: Diagnosis not present

## 2017-10-30 ENCOUNTER — Ambulatory Visit
Admission: RE | Admit: 2017-10-30 | Discharge: 2017-10-30 | Disposition: A | Discharge status: Home / Self Care | Payer: Medicare Other | Source: Ambulatory Visit | Attending: Radiation Oncology | Admitting: Radiation Oncology

## 2017-10-30 DIAGNOSIS — Z510 Encounter for antineoplastic radiation therapy: Secondary | ICD-10-CM | POA: Diagnosis not present

## 2017-10-31 ENCOUNTER — Ambulatory Visit
Admission: RE | Admit: 2017-10-31 | Discharge: 2017-10-31 | Disposition: A | Discharge status: Home / Self Care | Payer: Medicare Other | Source: Ambulatory Visit | Attending: Radiation Oncology | Admitting: Radiation Oncology

## 2017-10-31 DIAGNOSIS — Z510 Encounter for antineoplastic radiation therapy: Secondary | ICD-10-CM | POA: Diagnosis not present

## 2017-11-01 ENCOUNTER — Ambulatory Visit
Admission: RE | Admit: 2017-11-01 | Discharge: 2017-11-01 | Disposition: A | Discharge status: Home / Self Care | Payer: Medicare Other | Source: Ambulatory Visit | Attending: Radiation Oncology | Admitting: Radiation Oncology

## 2017-11-01 DIAGNOSIS — Z510 Encounter for antineoplastic radiation therapy: Secondary | ICD-10-CM | POA: Diagnosis not present

## 2017-11-04 ENCOUNTER — Ambulatory Visit
Admission: RE | Admit: 2017-11-04 | Discharge: 2017-11-04 | Disposition: A | Discharge status: Home / Self Care | Payer: Medicare Other | Source: Ambulatory Visit | Attending: Radiation Oncology | Admitting: Radiation Oncology

## 2017-11-04 DIAGNOSIS — Z510 Encounter for antineoplastic radiation therapy: Secondary | ICD-10-CM | POA: Diagnosis not present

## 2017-11-05 ENCOUNTER — Other Ambulatory Visit: Payer: Self-pay | Admitting: *Deleted

## 2017-11-05 ENCOUNTER — Ambulatory Visit
Admission: RE | Admit: 2017-11-05 | Discharge: 2017-11-05 | Disposition: A | Discharge status: Home / Self Care | Payer: Medicare Other | Source: Ambulatory Visit | Attending: Radiation Oncology | Admitting: Radiation Oncology

## 2017-11-05 DIAGNOSIS — Z510 Encounter for antineoplastic radiation therapy: Secondary | ICD-10-CM | POA: Diagnosis not present

## 2017-11-05 MED ORDER — TAMSULOSIN HCL 0.4 MG PO CAPS: 0.4000 mg | ORAL_CAPSULE | Freq: Every day | ORAL | 6 refills | Status: DC

## 2017-11-06 ENCOUNTER — Ambulatory Visit
Admission: RE | Admit: 2017-11-06 | Discharge: 2017-11-06 | Disposition: A | Discharge status: Home / Self Care | Payer: Medicare Other | Source: Ambulatory Visit | Attending: Radiation Oncology | Admitting: Radiation Oncology

## 2017-11-06 DIAGNOSIS — Z510 Encounter for antineoplastic radiation therapy: Secondary | ICD-10-CM | POA: Diagnosis not present

## 2017-11-07 ENCOUNTER — Encounter: Payer: Self-pay | Admitting: Urology

## 2017-11-07 ENCOUNTER — Ambulatory Visit
Admission: RE | Admit: 2017-11-07 | Discharge: 2017-11-07 | Disposition: A | Discharge status: Home / Self Care | Payer: Medicare Other | Source: Ambulatory Visit | Attending: Radiation Oncology | Admitting: Radiation Oncology

## 2017-11-07 DIAGNOSIS — Z510 Encounter for antineoplastic radiation therapy: Secondary | ICD-10-CM | POA: Diagnosis not present

## 2017-11-08 ENCOUNTER — Ambulatory Visit
Admission: RE | Admit: 2017-11-08 | Discharge: 2017-11-08 | Disposition: A | Discharge status: Home / Self Care | Payer: Medicare Other | Source: Ambulatory Visit | Attending: Radiation Oncology | Admitting: Radiation Oncology

## 2017-11-08 DIAGNOSIS — Z510 Encounter for antineoplastic radiation therapy: Secondary | ICD-10-CM | POA: Diagnosis not present

## 2017-11-08 DIAGNOSIS — C61 Malignant neoplasm of prostate: Secondary | ICD-10-CM | POA: Diagnosis present

## 2017-11-11 ENCOUNTER — Inpatient Hospital Stay: Payer: Medicare Other | Attending: Radiation Oncology

## 2017-11-11 ENCOUNTER — Ambulatory Visit
Admission: RE | Admit: 2017-11-11 | Discharge: 2017-11-11 | Disposition: A | Discharge status: Home / Self Care | Payer: Medicare Other | Source: Ambulatory Visit | Attending: Radiation Oncology | Admitting: Radiation Oncology

## 2017-11-11 DIAGNOSIS — C61 Malignant neoplasm of prostate: Secondary | ICD-10-CM

## 2017-11-11 DIAGNOSIS — Z510 Encounter for antineoplastic radiation therapy: Secondary | ICD-10-CM | POA: Diagnosis not present

## 2017-11-11 LAB — CBC
HEMATOCRIT: 36.9 % — AB (ref 39.0–52.0)
HEMOGLOBIN: 12 g/dL — AB (ref 13.0–17.0)
MCH: 31.4 pg (ref 26.0–34.0)
MCHC: 32.5 g/dL (ref 30.0–36.0)
MCV: 96.6 fL (ref 80.0–100.0)
NRBC: 0 % (ref 0.0–0.2)
PLATELETS: 152 10*3/uL (ref 150–400)
RBC: 3.82 MIL/uL — AB (ref 4.22–5.81)
RDW: 13.7 % (ref 11.5–15.5)
WBC: 4.9 10*3/uL (ref 4.0–10.5)

## 2017-11-12 ENCOUNTER — Ambulatory Visit
Admission: RE | Admit: 2017-11-12 | Discharge: 2017-11-12 | Disposition: A | Discharge status: Home / Self Care | Payer: Medicare Other | Source: Ambulatory Visit | Attending: Radiation Oncology | Admitting: Radiation Oncology

## 2017-11-12 DIAGNOSIS — Z510 Encounter for antineoplastic radiation therapy: Secondary | ICD-10-CM | POA: Diagnosis not present

## 2017-11-13 ENCOUNTER — Ambulatory Visit
Admission: RE | Admit: 2017-11-13 | Discharge: 2017-11-13 | Disposition: A | Discharge status: Home / Self Care | Payer: Medicare Other | Source: Ambulatory Visit | Attending: Radiation Oncology | Admitting: Radiation Oncology

## 2017-11-13 DIAGNOSIS — Z510 Encounter for antineoplastic radiation therapy: Secondary | ICD-10-CM | POA: Diagnosis not present

## 2017-11-14 ENCOUNTER — Ambulatory Visit
Admission: RE | Admit: 2017-11-14 | Discharge: 2017-11-14 | Disposition: A | Discharge status: Home / Self Care | Payer: Medicare Other | Source: Ambulatory Visit | Attending: Radiation Oncology | Admitting: Radiation Oncology

## 2017-11-14 DIAGNOSIS — Z510 Encounter for antineoplastic radiation therapy: Secondary | ICD-10-CM | POA: Diagnosis not present

## 2017-11-15 ENCOUNTER — Ambulatory Visit
Admission: RE | Admit: 2017-11-15 | Discharge: 2017-11-15 | Disposition: A | Discharge status: Home / Self Care | Payer: Medicare Other | Source: Ambulatory Visit | Attending: Radiation Oncology | Admitting: Radiation Oncology

## 2017-11-15 ENCOUNTER — Encounter: Payer: Medicare Other | Admitting: Physical Therapy

## 2017-11-15 DIAGNOSIS — Z510 Encounter for antineoplastic radiation therapy: Secondary | ICD-10-CM | POA: Diagnosis not present

## 2017-11-18 ENCOUNTER — Ambulatory Visit
Admission: RE | Admit: 2017-11-18 | Discharge: 2017-11-18 | Disposition: A | Discharge status: Home / Self Care | Payer: Medicare Other | Source: Ambulatory Visit | Attending: Radiation Oncology | Admitting: Radiation Oncology

## 2017-11-18 DIAGNOSIS — Z510 Encounter for antineoplastic radiation therapy: Secondary | ICD-10-CM | POA: Diagnosis not present

## 2017-11-19 ENCOUNTER — Ambulatory Visit
Admission: RE | Admit: 2017-11-19 | Discharge: 2017-11-19 | Disposition: A | Discharge status: Home / Self Care | Payer: Medicare Other | Source: Ambulatory Visit | Attending: Radiation Oncology | Admitting: Radiation Oncology

## 2017-11-19 DIAGNOSIS — Z510 Encounter for antineoplastic radiation therapy: Secondary | ICD-10-CM | POA: Diagnosis not present

## 2017-11-20 ENCOUNTER — Ambulatory Visit
Admission: RE | Admit: 2017-11-20 | Discharge: 2017-11-20 | Disposition: A | Discharge status: Home / Self Care | Payer: Medicare Other | Source: Ambulatory Visit | Attending: Radiation Oncology | Admitting: Radiation Oncology

## 2017-11-20 DIAGNOSIS — Z510 Encounter for antineoplastic radiation therapy: Secondary | ICD-10-CM | POA: Diagnosis not present

## 2017-11-21 ENCOUNTER — Ambulatory Visit
Admission: RE | Admit: 2017-11-21 | Discharge: 2017-11-21 | Disposition: A | Discharge status: Home / Self Care | Payer: Medicare Other | Source: Ambulatory Visit | Attending: Radiation Oncology | Admitting: Radiation Oncology

## 2017-11-21 ENCOUNTER — Encounter: Payer: Medicare Other | Admitting: Physical Therapy

## 2017-11-21 DIAGNOSIS — Z510 Encounter for antineoplastic radiation therapy: Secondary | ICD-10-CM | POA: Diagnosis not present

## 2017-11-22 ENCOUNTER — Ambulatory Visit
Admission: RE | Admit: 2017-11-22 | Discharge: 2017-11-22 | Disposition: A | Discharge status: Home / Self Care | Payer: Medicare Other | Source: Ambulatory Visit | Attending: Radiation Oncology | Admitting: Radiation Oncology

## 2017-11-22 DIAGNOSIS — Z510 Encounter for antineoplastic radiation therapy: Secondary | ICD-10-CM | POA: Diagnosis not present

## 2017-11-25 ENCOUNTER — Ambulatory Visit
Admission: RE | Admit: 2017-11-25 | Discharge: 2017-11-25 | Disposition: A | Discharge status: Home / Self Care | Payer: Medicare Other | Source: Ambulatory Visit | Attending: Radiation Oncology | Admitting: Radiation Oncology

## 2017-11-25 ENCOUNTER — Inpatient Hospital Stay: Payer: Medicare Other

## 2017-11-25 ENCOUNTER — Other Ambulatory Visit: Payer: Self-pay

## 2017-11-25 DIAGNOSIS — C61 Malignant neoplasm of prostate: Secondary | ICD-10-CM

## 2017-11-25 DIAGNOSIS — Z510 Encounter for antineoplastic radiation therapy: Secondary | ICD-10-CM | POA: Diagnosis not present

## 2017-11-25 LAB — CBC
HCT: 37.3 % — ABNORMAL LOW (ref 39.0–52.0)
HEMOGLOBIN: 12.3 g/dL — AB (ref 13.0–17.0)
MCH: 31.4 pg (ref 26.0–34.0)
MCHC: 33 g/dL (ref 30.0–36.0)
MCV: 95.2 fL (ref 80.0–100.0)
NRBC: 0 % (ref 0.0–0.2)
Platelets: 175 10*3/uL (ref 150–400)
RBC: 3.92 MIL/uL — AB (ref 4.22–5.81)
RDW: 13.5 % (ref 11.5–15.5)
WBC: 3.4 10*3/uL — ABNORMAL LOW (ref 4.0–10.5)

## 2017-11-26 ENCOUNTER — Ambulatory Visit
Admission: RE | Admit: 2017-11-26 | Discharge: 2017-11-26 | Disposition: A | Discharge status: Home / Self Care | Payer: Medicare Other | Source: Ambulatory Visit | Attending: Radiation Oncology | Admitting: Radiation Oncology

## 2017-11-26 DIAGNOSIS — Z510 Encounter for antineoplastic radiation therapy: Secondary | ICD-10-CM | POA: Diagnosis not present

## 2017-11-27 ENCOUNTER — Ambulatory Visit
Admission: RE | Admit: 2017-11-27 | Discharge: 2017-11-27 | Disposition: A | Discharge status: Home / Self Care | Payer: Medicare Other | Source: Ambulatory Visit | Attending: Radiation Oncology | Admitting: Radiation Oncology

## 2017-11-27 DIAGNOSIS — Z510 Encounter for antineoplastic radiation therapy: Secondary | ICD-10-CM | POA: Diagnosis not present

## 2017-11-28 ENCOUNTER — Ambulatory Visit
Admission: RE | Admit: 2017-11-28 | Discharge: 2017-11-28 | Disposition: A | Discharge status: Home / Self Care | Payer: Medicare Other | Source: Ambulatory Visit | Attending: Radiation Oncology | Admitting: Radiation Oncology

## 2017-11-28 ENCOUNTER — Encounter: Payer: Medicare Other | Admitting: Physical Therapy

## 2017-11-28 DIAGNOSIS — Z510 Encounter for antineoplastic radiation therapy: Secondary | ICD-10-CM | POA: Diagnosis not present

## 2017-11-29 ENCOUNTER — Ambulatory Visit
Admission: RE | Admit: 2017-11-29 | Discharge: 2017-11-29 | Disposition: A | Discharge status: Home / Self Care | Payer: Medicare Other | Source: Ambulatory Visit | Attending: Radiation Oncology | Admitting: Radiation Oncology

## 2017-11-29 DIAGNOSIS — Z510 Encounter for antineoplastic radiation therapy: Secondary | ICD-10-CM | POA: Diagnosis not present

## 2017-12-02 ENCOUNTER — Ambulatory Visit
Admission: RE | Admit: 2017-12-02 | Discharge: 2017-12-02 | Disposition: A | Discharge status: Home / Self Care | Payer: Medicare Other | Source: Ambulatory Visit | Attending: Radiation Oncology | Admitting: Radiation Oncology

## 2017-12-02 DIAGNOSIS — Z510 Encounter for antineoplastic radiation therapy: Secondary | ICD-10-CM | POA: Diagnosis not present

## 2017-12-03 ENCOUNTER — Ambulatory Visit
Admission: RE | Admit: 2017-12-03 | Discharge: 2017-12-03 | Disposition: A | Discharge status: Home / Self Care | Payer: Medicare Other | Source: Ambulatory Visit | Attending: Radiation Oncology | Admitting: Radiation Oncology

## 2017-12-03 DIAGNOSIS — Z510 Encounter for antineoplastic radiation therapy: Secondary | ICD-10-CM | POA: Diagnosis not present

## 2017-12-04 ENCOUNTER — Encounter: Payer: Medicare Other | Admitting: Physical Therapy

## 2017-12-04 ENCOUNTER — Ambulatory Visit
Admission: RE | Admit: 2017-12-04 | Discharge: 2017-12-04 | Disposition: A | Discharge status: Home / Self Care | Payer: Medicare Other | Source: Ambulatory Visit | Attending: Radiation Oncology | Admitting: Radiation Oncology

## 2017-12-04 DIAGNOSIS — Z510 Encounter for antineoplastic radiation therapy: Secondary | ICD-10-CM | POA: Diagnosis not present

## 2017-12-09 ENCOUNTER — Inpatient Hospital Stay: Payer: Medicare Other | Attending: Radiation Oncology

## 2017-12-09 ENCOUNTER — Other Ambulatory Visit: Payer: Self-pay

## 2017-12-09 ENCOUNTER — Ambulatory Visit
Admission: RE | Admit: 2017-12-09 | Discharge: 2017-12-09 | Disposition: A | Discharge status: Home / Self Care | Payer: Medicare Other | Source: Ambulatory Visit | Attending: Radiation Oncology | Admitting: Radiation Oncology

## 2017-12-09 DIAGNOSIS — C61 Malignant neoplasm of prostate: Secondary | ICD-10-CM | POA: Insufficient documentation

## 2017-12-09 DIAGNOSIS — Z510 Encounter for antineoplastic radiation therapy: Secondary | ICD-10-CM | POA: Insufficient documentation

## 2017-12-09 LAB — CBC
HCT: 37 % — ABNORMAL LOW (ref 39.0–52.0)
Hemoglobin: 12.2 g/dL — ABNORMAL LOW (ref 13.0–17.0)
MCH: 32 pg (ref 26.0–34.0)
MCHC: 33 g/dL (ref 30.0–36.0)
MCV: 97.1 fL (ref 80.0–100.0)
Platelets: 175 10*3/uL (ref 150–400)
RBC: 3.81 MIL/uL — AB (ref 4.22–5.81)
RDW: 13.8 % (ref 11.5–15.5)
WBC: 4.8 10*3/uL (ref 4.0–10.5)
nRBC: 0 % (ref 0.0–0.2)

## 2017-12-10 ENCOUNTER — Ambulatory Visit
Admission: RE | Admit: 2017-12-10 | Discharge: 2017-12-10 | Disposition: A | Discharge status: Home / Self Care | Payer: Medicare Other | Source: Ambulatory Visit | Attending: Radiation Oncology | Admitting: Radiation Oncology

## 2017-12-10 DIAGNOSIS — Z510 Encounter for antineoplastic radiation therapy: Secondary | ICD-10-CM | POA: Diagnosis not present

## 2017-12-11 ENCOUNTER — Ambulatory Visit
Admission: RE | Admit: 2017-12-11 | Discharge: 2017-12-11 | Disposition: A | Discharge status: Home / Self Care | Payer: Medicare Other | Source: Ambulatory Visit | Attending: Radiation Oncology | Admitting: Radiation Oncology

## 2017-12-11 DIAGNOSIS — Z510 Encounter for antineoplastic radiation therapy: Secondary | ICD-10-CM | POA: Diagnosis not present

## 2017-12-12 ENCOUNTER — Encounter: Payer: Medicare Other | Admitting: Physical Therapy

## 2017-12-12 ENCOUNTER — Ambulatory Visit
Admission: RE | Admit: 2017-12-12 | Discharge: 2017-12-12 | Disposition: A | Discharge status: Home / Self Care | Payer: Medicare Other | Source: Ambulatory Visit | Attending: Radiation Oncology | Admitting: Radiation Oncology

## 2017-12-12 DIAGNOSIS — Z510 Encounter for antineoplastic radiation therapy: Secondary | ICD-10-CM | POA: Diagnosis not present

## 2017-12-13 ENCOUNTER — Ambulatory Visit
Admission: RE | Admit: 2017-12-13 | Discharge: 2017-12-13 | Disposition: A | Discharge status: Home / Self Care | Payer: Medicare Other | Source: Ambulatory Visit | Attending: Radiation Oncology | Admitting: Radiation Oncology

## 2017-12-13 DIAGNOSIS — Z510 Encounter for antineoplastic radiation therapy: Secondary | ICD-10-CM | POA: Diagnosis not present

## 2017-12-16 ENCOUNTER — Ambulatory Visit
Admission: RE | Admit: 2017-12-16 | Discharge: 2017-12-16 | Disposition: A | Discharge status: Home / Self Care | Payer: Medicare Other | Source: Ambulatory Visit | Attending: Radiation Oncology | Admitting: Radiation Oncology

## 2017-12-16 DIAGNOSIS — Z510 Encounter for antineoplastic radiation therapy: Secondary | ICD-10-CM | POA: Diagnosis not present

## 2017-12-17 ENCOUNTER — Ambulatory Visit
Admission: RE | Admit: 2017-12-17 | Discharge: 2017-12-17 | Disposition: A | Discharge status: Home / Self Care | Payer: Medicare Other | Source: Ambulatory Visit | Attending: Radiation Oncology | Admitting: Radiation Oncology

## 2017-12-17 DIAGNOSIS — Z510 Encounter for antineoplastic radiation therapy: Secondary | ICD-10-CM | POA: Diagnosis not present

## 2017-12-18 ENCOUNTER — Ambulatory Visit
Admission: RE | Admit: 2017-12-18 | Discharge: 2017-12-18 | Disposition: A | Discharge status: Home / Self Care | Payer: Medicare Other | Source: Ambulatory Visit | Attending: Radiation Oncology | Admitting: Radiation Oncology

## 2017-12-18 DIAGNOSIS — Z510 Encounter for antineoplastic radiation therapy: Secondary | ICD-10-CM | POA: Diagnosis not present

## 2017-12-19 ENCOUNTER — Ambulatory Visit
Admission: RE | Admit: 2017-12-19 | Discharge: 2017-12-19 | Disposition: A | Discharge status: Home / Self Care | Payer: Medicare Other | Source: Ambulatory Visit | Attending: Radiation Oncology | Admitting: Radiation Oncology

## 2017-12-19 ENCOUNTER — Encounter: Payer: Medicare Other | Admitting: Physical Therapy

## 2017-12-19 DIAGNOSIS — Z510 Encounter for antineoplastic radiation therapy: Secondary | ICD-10-CM | POA: Diagnosis not present

## 2017-12-20 ENCOUNTER — Ambulatory Visit
Admission: RE | Admit: 2017-12-20 | Discharge: 2017-12-20 | Disposition: A | Discharge status: Home / Self Care | Payer: Medicare Other | Source: Ambulatory Visit | Attending: Radiation Oncology | Admitting: Radiation Oncology

## 2017-12-20 DIAGNOSIS — Z510 Encounter for antineoplastic radiation therapy: Secondary | ICD-10-CM | POA: Diagnosis not present

## 2017-12-23 ENCOUNTER — Ambulatory Visit
Admission: RE | Admit: 2017-12-23 | Discharge: 2017-12-23 | Disposition: A | Discharge status: Home / Self Care | Payer: Medicare Other | Source: Ambulatory Visit | Attending: Radiation Oncology | Admitting: Radiation Oncology

## 2017-12-23 DIAGNOSIS — Z510 Encounter for antineoplastic radiation therapy: Secondary | ICD-10-CM | POA: Diagnosis not present

## 2017-12-24 ENCOUNTER — Other Ambulatory Visit: Payer: Medicare Other

## 2017-12-24 ENCOUNTER — Ambulatory Visit
Admission: RE | Admit: 2017-12-24 | Discharge: 2017-12-24 | Disposition: A | Discharge status: Home / Self Care | Payer: Medicare Other | Source: Ambulatory Visit | Attending: Radiation Oncology | Admitting: Radiation Oncology

## 2017-12-24 DIAGNOSIS — Z510 Encounter for antineoplastic radiation therapy: Secondary | ICD-10-CM | POA: Diagnosis not present

## 2017-12-26 ENCOUNTER — Ambulatory Visit: Payer: Medicare Other | Admitting: Urology

## 2018-01-30 ENCOUNTER — Ambulatory Visit
Admission: RE | Admit: 2018-01-30 | Discharge: 2018-01-30 | Disposition: A | Discharge status: Home / Self Care | Payer: Medicare Other | Source: Ambulatory Visit | Attending: Radiation Oncology | Admitting: Radiation Oncology

## 2018-01-30 ENCOUNTER — Other Ambulatory Visit: Payer: Self-pay | Admitting: *Deleted

## 2018-01-30 ENCOUNTER — Encounter: Payer: Self-pay | Admitting: Radiation Oncology

## 2018-01-30 ENCOUNTER — Other Ambulatory Visit: Payer: Self-pay

## 2018-01-30 VITALS — BP 135/68 | HR 72 | Temp 97.3°F | Resp 16 | Wt 256.1 lb

## 2018-01-30 DIAGNOSIS — C61 Malignant neoplasm of prostate: Secondary | ICD-10-CM | POA: Insufficient documentation

## 2018-01-30 DIAGNOSIS — R351 Nocturia: Secondary | ICD-10-CM | POA: Insufficient documentation

## 2018-01-30 DIAGNOSIS — Z923 Personal history of irradiation: Secondary | ICD-10-CM | POA: Insufficient documentation

## 2018-01-30 MED ORDER — TAMSULOSIN HCL 0.4 MG PO CAPS: 0.4000 mg | ORAL_CAPSULE | Freq: Every day | ORAL | 3 refills | Status: AC

## 2018-01-30 NOTE — Progress Notes (Signed)
Radiation Oncology Follow up Note  Name: Taylor Austin   Date:   01/30/2018 MRN:  [id number] DOB: [birth year]    This 78 y.o. male presents to the clinic today for one-month follow-up status post.image guided I MRT radiation therapy to his prostate for stage IIa Gleason 7(4+3) adenocarcinoma the prostate  REFERRING PROVIDER: Juluis Pitch, MD  HPI: patient is a 78 year old male now out 1 month having completed IM RT radiation therapy to his prostate for stage II a adenocarcinoma Gleason score of 7 (4+3). He is seen today in routine follow-up is doing well continues to have significant amount ofurinary frequency and urgency nocturia 2. He was on Flomax although he discontinued that. He specifically denies any episodes of diarrhea..  COMPLICATIONS OF TREATMENT: none  FOLLOW UP COMPLIANCE: keeps appointments   PHYSICAL EXAM:  BP 135/68   Pulse 72   Temp (!) 97.3 F (36.3 C) (Tympanic)   Resp 16   Wt 256 lb 1 oz (116.2 kg)   BMI 41.33 kg/m  Well-developed well-nourished patient in NAD. HEENT reveals PERLA, EOMI, discs not visualized.  Oral cavity is clear. No oral mucosal lesions are identified. Neck is clear without evidence of cervical or supraclavicular adenopathy. Lungs are clear to A&P. Cardiac examination is essentially unremarkable with regular rate and rhythm without murmur rub or thrill. Abdomen is benign with no organomegaly or masses noted. Motor sensory and DTR levels are equal and symmetric in the upper and lower extremities. Cranial nerves II through XII are grossly intact. Proprioception is intact. No peripheral adenopathy or edema is identified. No motor or sensory levels are noted. Crude visual fields are within normal range.  RADIOLOGY RESULTS: no current films for review  PLAN: at the present time he is doing well I'm starting him back on Flomax 1 tab half an hour after dinner each night. I've also ordered a follow-up PSA in about 3-4 months with a follow-up visit  shortly thereafter. Patient is otherwise doing well with low side effect profile. He is seeing Dr. Erlene Quan for possible Lupron injection which she's not yet received. Patient knows to call with any concerns at any time.  I would like to take this opportunity to thank you for allowing me to participate in the care of your patient.Noreene Filbert, MD

## 2018-02-05 DIAGNOSIS — E785 Hyperlipidemia, unspecified: Secondary | ICD-10-CM | POA: Insufficient documentation

## 2018-02-05 DIAGNOSIS — E1169 Type 2 diabetes mellitus with other specified complication: Secondary | ICD-10-CM | POA: Insufficient documentation

## 2018-02-11 ENCOUNTER — Encounter: Payer: Self-pay | Admitting: Urology

## 2018-02-11 ENCOUNTER — Ambulatory Visit (INDEPENDENT_AMBULATORY_CARE_PROVIDER_SITE_OTHER): Payer: Medicare Other | Admitting: Urology

## 2018-02-11 VITALS — BP 142/69 | HR 69 | Ht 65.0 in | Wt 253.0 lb

## 2018-02-11 DIAGNOSIS — N3281 Overactive bladder: Secondary | ICD-10-CM | POA: Diagnosis not present

## 2018-02-11 DIAGNOSIS — N398 Other specified disorders of urinary system: Secondary | ICD-10-CM

## 2018-02-11 DIAGNOSIS — C61 Malignant neoplasm of prostate: Secondary | ICD-10-CM | POA: Diagnosis not present

## 2018-02-11 NOTE — Progress Notes (Signed)
02/11/2018  10:02 AM   Taylor Austin 1940/03/02 [id number]  Referring provider: Juluis Pitch, MD [phone] [address] [place], [place] [address]  Chief Complaint  Patient presents with  . Prostate Cancer    4 months    HPI: Taylor Austin is a 78 y.o. male that presents today for his 34-monthevaluation and management of prostate cancer.  Prostate Cancer Intermediate Gleason 4+3 prostate cancer s/p  IMRT in 12/2017.   Patient is interested in the Lupron injections that we previously discussed.   He underwent prostate biopsy on 08/13/2017 due to rising PSA despite being on finasteride.  His PSA at the time was 3.3.  Prostate biopsy revealed only 2 cores positive of disease, 2% of the left base described as 4+3 prostate cancer as well as Gleason 3+3 at the left mid involving only 2%.  There is also a suspicious lesion in the left apex but this was nondiagnostic.  TRUS vol 29 cc.  Dysfunctional Voiding Previously referred to physical therapy. Patient reports that he previously did not attempt physical therapy due to radiation, but he would be interested in implementing it down the line.  Patient reports urgency. Recently experienced good flow. He restarted Flomax and reports that he is experiencing improved voiding symptoms.   UDS 12/2017  indicated that he may also have dysfunctional voiding and OAB. Previously tried multiple medications including anticholinergics, finasteride, Myrbetriq, PTNS without any improvement.  He underwent cystoscopy 01/2017 with 3 to 4 cm which was somewhat obstructive without a significant median lobe or bladder pathology.  TRUS volume 29 cc.  OAB Refractory overactivity symptoms, little bit from previous interventions (Myrbetriq, anticholinergics and PTNS). See above.  PMH: Past Medical History:  Diagnosis Date  . Allergy   . Anemia   . Arthritis   . Back pain   . Barrett esophagus   . BPH (benign prostatic hyperplasia)   . Chronic kidney  disease   . COPD (chronic obstructive pulmonary disease) ([facility]   . Coronary artery disease   . Depression   . Diabetes mellitus without complication ([place]   . Diverticulitis   . Dysphagia   . Esophageal reflux   . Esophageal reflux   . GERD (gastroesophageal reflux disease)   . Heart murmur   . Hyperlipidemia   . Hypersomnia   . Hypertension   . Kidney stones   . Low testosterone   . OAB (overactive bladder)   . Obesity   . Presence of dental bridge    2 - top  . Rheumatic fever   . Sleep apnea    BiPAP  . Stroke ([place] 01/2016  . Vertigo   . Vitamin B12 deficiency     Surgical History: Past Surgical History:  Procedure Laterality Date  . BACK SURGERY    . CARDIAC CATHETERIZATION  08/11/2014   Procedure: Left Heart Cath and Coronary Angiography;  Surgeon: KTeodoro Spray MD;  [facility];  Service: Cardiovascular;;  . CARDIAC CATHETERIZATION N/A 11/17/2014   Procedure: Right Heart Cath;  Surgeon: KTeodoro Spray MD;  [facility];  Service: Cardiovascular;  Laterality: N/A;  . CAROTID STENT    . CATARACT EXTRACTION W/PHACO Left 04/03/2016   Procedure: CATARACT EXTRACTION PHACO AND INTRAOCULAR LENS PLACEMENT ([place] diabetic;  Surgeon: BEulogio Bear MD;  [facility] [facility]  Service: Ophthalmology;  Laterality: Left;  diabetic - oral meds sleep apnea  . CATARACT EXTRACTION W/PHACO Right 05/01/2016   Procedure: CATARACT  EXTRACTION PHACO AND INTRAOCULAR LENS PLACEMENT (IOC) Right diabetic;  Surgeon: Eulogio Bear, MD;  Location: [facility];  Service: Ophthalmology;  Laterality: Right;  Diabetic oral meds sleep apnea  . CERVICAL SPINE SURGERY    . COLONOSCOPY    . COLONOSCOPY WITH PROPOFOL N/A 06/10/2015   Procedure: COLONOSCOPY WITH PROPOFOL;  Surgeon: Manya Silvas, MD;  [facility];  Service: Endoscopy;  Laterality: N/A;  . CORONARY ANGIOPLASTY    . ESOPHAGOGASTRODUODENOSCOPY (EGD) WITH  PROPOFOL N/A 06/28/2017   Procedure: ESOPHAGOGASTRODUODENOSCOPY (EGD) WITH PROPOFOL;  Surgeon: Manya Silvas, MD;  Location: [facility] ENDOSCOPY;  Service: Endoscopy;  Laterality: N/A;  . ESOPHAGOGASTRODUODENOSCOPY ENDOSCOPY    . EYE SURGERY    . LUMBAR SPINE SURGERY    . THORACIC SPINE SURGERY    . TONSILLECTOMY      Home Medications:  Allergies as of 02/11/2018      Reactions   Hydrocodone Shortness Of Breath   Oxycodone-acetaminophen Shortness Of Breath      Medication List       Accurate as of February 11, 2018 10:02 AM. Always use your most recent med list.        ACCU-CHEK AVIVA PLUS test strip Generic drug:  glucose blood USE STRIP TO CHECK GLUCOSE ONCE DAILY   albuterol 108 (90 Base) MCG/ACT inhaler Commonly known as:  PROVENTIL HFA;VENTOLIN HFA Inhale into the lungs every 6 (six) hours as needed for wheezing or shortness of breath.   ANORO ELLIPTA 62.5-25 MCG/INH Aepb Generic drug:  umeclidinium-vilanterol Inhale 1 puff into the lungs daily.   aspirin EC 81 MG tablet Take 81 mg by mouth daily.   atorvastatin 40 MG tablet Commonly known as:  LIPITOR TAKE 1 TABLET BY MOUTH ONCE DAILY   clopidogrel 75 MG tablet Commonly known as:  PLAVIX Take 1 tablet (75 mg total) by mouth daily.   donepezil 10 MG tablet Commonly known as:  ARICEPT Take by mouth.   FIFTY50 GLUCOSE METER 2.0 w/Device Kit Use as directed   finasteride 5 MG tablet Commonly known as:  PROSCAR TAKE ONE TABLET BY MOUTH ONCE DAILY   gabapentin 100 MG capsule Commonly known as:  NEURONTIN Take 100 mg by mouth 3 (three) times daily.   glimepiride 2 MG tablet Commonly known as:  AMARYL Take 2 mg by mouth daily with breakfast.   glyBURIDE-metformin 5-500 MG tablet Commonly known as:  GLUCOVANCE TAKE ONE TABLET BY MOUTH TWICE DAILY WITH MEALS   losartan 100 MG tablet Commonly known as:  COZAAR Take 100 mg by mouth daily.   magnesium oxide 400 MG tablet Commonly known as:  MAG-OX Take  400 mg by mouth 2 (two) times daily.   MULTI-VITAMINS Tabs Take 1 tablet by mouth daily.   oxymetazoline 0.05 % nasal spray Commonly known as:  AFRIN Place 1 spray into both nostrils 2 (two) times daily.   pantoprazole 40 MG tablet Commonly known as:  PROTONIX Take by mouth.   pioglitazone 45 MG tablet Commonly known as:  ACTOS Take 45 mg by mouth daily.   PROBIOTIC PO Take by mouth.   simvastatin 40 MG tablet Commonly known as:  ZOCOR Take 40 mg by mouth daily at 6 PM.   tamsulosin 0.4 MG Caps capsule Commonly known as:  FLOMAX Take 1 capsule (0.4 mg total) by mouth daily after supper.   VITAMIN B-12 PO Take by mouth.       Allergies:  Allergies  Allergen Reactions  . Hydrocodone Shortness  Of Breath  . Oxycodone-Acetaminophen Shortness Of Breath    Family History: Family History  Problem Relation Age of Onset  . Stroke Father   . Anesthesia problems Father   . Anesthesia problems Mother   . Prostate cancer Brother   . Kidney disease Neg Hx   . Bladder Cancer Neg Hx     Social History:  reports that he quit smoking about 44 years ago. His smoking use included cigarettes. He has never used smokeless tobacco. He reports current alcohol use of about 1.0 standard drinks of alcohol per week. He reports that he does not use drugs.  Physical Exam: BP (!) 142/69   Pulse 69   Ht _0  (1.651 m)   Wt 253 lb (114.8 kg)   BMI 42.10 kg/m   Constitutional:  Alert and oriented, No acute distress.  Company by wife today. Accompanied by wife. Pleasant couple. Head: Atraumatic and Normocephalic Respiratory: Normal respiratory effort, no increased work of breathing. Skin: No rashes, bruises or suspicious lesions. Neurologic: Grossly intact, no focal deficits, moving all 4 extremities. Psychiatric: Normal mood and affect.  Laboratory Data: Lab Results  Component Value Date   WBC 4.8 12/09/2017   HGB 12.2 (L) 12/09/2017   HCT 37.0 (L) 12/09/2017   MCV 97.1  12/09/2017   PLT 175 12/09/2017   Assessment & Plan:    1. Prostate cancer ([place])  - Explained to patient that Lupron is a hormone used to treat prostate cancer symptoms that has been found to improve outcomes when combined with IMRT. Patient is agreeable to plan.             - Plan for ADT injection next week, lupron 6 months  - PSA to be checked at next appointment with Dr. Baruch Gouty.  2. Urinary Frequency/Urgency  - Symptoms improving with Flomax. Will continue for the forseeable future.   3. Dysfunctional voiding of urine  - He reports improved stream since restarting Flomax.  - Patient reports that he previously did not attempt physical therapy in the setting of radiation. Will pursue in the future if symptoms worsen.   Return in about 1 week (around 02/18/2018) for nurse visit for 68-monthdepo of lupron.   [id number] [place] [phone] I, TStephania Fragmin, am acting as a scribe for AHollice Espy MD  I have reviewed the above documentation for accuracy and completeness, and I agree with the above.   AHollice Espy MD

## 2018-02-18 ENCOUNTER — Ambulatory Visit (INDEPENDENT_AMBULATORY_CARE_PROVIDER_SITE_OTHER): Payer: Medicare Other

## 2018-02-18 DIAGNOSIS — C61 Malignant neoplasm of prostate: Secondary | ICD-10-CM | POA: Diagnosis not present

## 2018-02-18 MED ORDER — LEUPROLIDE ACETATE (6 MONTH) 45 MG IM KIT
45.0000 mg | PACK | Freq: Once | INTRAMUSCULAR | Status: AC
  Administered 2018-02-18: 45 mg via INTRAMUSCULAR

## 2018-02-18 NOTE — Progress Notes (Signed)
Lupron IM Injection   Due to Prostate Cancer patient is present today for a Lupron Injection.  Medication: Lupron 6 month Dose: 45 mg  Location: left upper outer buttocks Lot: [id number] Exp: 12/01/2019  Patient tolerated well, no complications were noted  Performed by: [place] G, CMA (AAMA)  Follow up: RTC in 58mos

## 2018-02-18 NOTE — Patient Instructions (Signed)
Please begin taking a calcium and vitamin D supplement while on Lupron  Calcium 1200iu  Vitamin D 600iu   Leuprolide injection What is this medicine? LEUPROLIDE (loo PROE lide) is a man-made hormone. It is used to treat the symptoms of prostate cancer. This medicine may also be used to treat children with early onset of puberty. It may be used for other hormonal conditions. This medicine may be used for other purposes; ask your health care provider or pharmacist if you have questions. COMMON BRAND NAME(S): Lupron What should I tell my health care provider before I take this medicine? They need to know if you have any of these conditions: -diabetes -heart disease or previous heart attack -high blood pressure -high cholesterol -pain or difficulty passing urine -spinal cord metastasis -stroke -tobacco smoker -an unusual or allergic reaction to leuprolide, benzyl alcohol, other medicines, foods, dyes, or preservatives -pregnant or trying to get pregnant -breast-feeding How should I use this medicine? This medicine is for injection under the skin or into a muscle. You will be taught how to prepare and give this medicine. Use exactly as directed. Take your medicine at regular intervals. Do not take your medicine more often than directed. It is important that you put your used needles and syringes in a special sharps container. Do not put them in a trash can. If you do not have a sharps container, call your pharmacist or healthcare provider to get one. A special MedGuide will be given to you by the pharmacist with each prescription and refill. Be sure to read this information carefully each time. Talk to your pediatrician regarding the use of this medicine in children. While this medicine may be prescribed for children as young as 8 years for selected conditions, precautions do apply. Overdosage: If you think you have taken too much of this medicine contact a poison control center or emergency  room at once. NOTE: This medicine is only for you. Do not share this medicine with others. What if I miss a dose? If you miss a dose, take it as soon as you can. If it is almost time for your next dose, take only that dose. Do not take double or extra doses. What may interact with this medicine? Do not take this medicine with any of the following medications: -chasteberry This medicine may also interact with the following medications: -herbal or dietary supplements, like black cohosh or DHEA -male hormones, like estrogens or progestins and birth control pills, patches, rings, or injections -male hormones, like testosterone This list may not describe all possible interactions. Give your health care provider a list of all the medicines, herbs, non-prescription drugs, or dietary supplements you use. Also tell them if you smoke, drink alcohol, or use illegal drugs. Some items may interact with your medicine. What should I watch for while using this medicine? Visit your doctor or health care professional for regular checks on your progress. During the first week, your symptoms may get worse, but then will improve as you continue your treatment. You may get hot flashes, increased bone pain, increased difficulty passing urine, or an aggravation of nerve symptoms. Discuss these effects with your doctor or health care professional, some of them may improve with continued use of this medicine. Male patients may experience a menstrual cycle or spotting during the first 2 months of therapy with this medicine. If this continues, contact your doctor or health care professional. What side effects may I notice from receiving this medicine? Side effects that  you should report to your doctor or health care professional as soon as possible: -allergic reactions like skin rash, itching or hives, swelling of the face, lips, or tongue -breathing problems -chest pain -depression or memory disorders -pain in your  legs or groin -pain at site where injected -severe headache -swelling of the feet and legs -visual changes -vomiting Side effects that usually do not require medical attention (report to your doctor or health care professional if they continue or are bothersome): -breast swelling or tenderness -decrease in sex drive or performance -diarrhea -hot flashes -loss of appetite -muscle, joint, or bone pains -nausea -redness or irritation at site where injected -skin problems or acne This list may not describe all possible side effects. Call your doctor for medical advice about side effects. You may report side effects to FDA at 1-800-FDA-1088. Where should I keep my medicine? Keep out of the reach of children. Store below 25 degrees C (77 degrees F). Do not freeze. Protect from light. Do not use if it is not clear or if there are particles present. Throw away any unused medicine after the expiration date. NOTE: This sheet is a summary. It may not cover all possible information. If you have questions about this medicine, talk to your doctor, pharmacist, or health care provider.  2019 Elsevier/Gold Standard (2015-08-15 10:54:35)

## 2018-03-10 DIAGNOSIS — G4733 Obstructive sleep apnea (adult) (pediatric): Secondary | ICD-10-CM | POA: Diagnosis not present

## 2018-03-10 DIAGNOSIS — J449 Chronic obstructive pulmonary disease, unspecified: Secondary | ICD-10-CM | POA: Diagnosis not present

## 2018-03-10 DIAGNOSIS — R0609 Other forms of dyspnea: Secondary | ICD-10-CM | POA: Diagnosis not present

## 2018-03-13 DIAGNOSIS — M5440 Lumbago with sciatica, unspecified side: Secondary | ICD-10-CM | POA: Diagnosis not present

## 2018-03-13 DIAGNOSIS — G8929 Other chronic pain: Secondary | ICD-10-CM | POA: Diagnosis not present

## 2018-03-13 DIAGNOSIS — I69328 Other speech and language deficits following cerebral infarction: Secondary | ICD-10-CM | POA: Diagnosis not present

## 2018-03-13 DIAGNOSIS — M25562 Pain in left knee: Secondary | ICD-10-CM | POA: Diagnosis not present

## 2018-03-17 DIAGNOSIS — I69328 Other speech and language deficits following cerebral infarction: Secondary | ICD-10-CM | POA: Diagnosis not present

## 2018-03-17 DIAGNOSIS — E1169 Type 2 diabetes mellitus with other specified complication: Secondary | ICD-10-CM | POA: Diagnosis not present

## 2018-03-17 DIAGNOSIS — I10 Essential (primary) hypertension: Secondary | ICD-10-CM | POA: Diagnosis not present

## 2018-03-17 DIAGNOSIS — R635 Abnormal weight gain: Secondary | ICD-10-CM | POA: Diagnosis not present

## 2018-03-17 DIAGNOSIS — Z0000 Encounter for general adult medical examination without abnormal findings: Secondary | ICD-10-CM | POA: Diagnosis not present

## 2018-03-17 DIAGNOSIS — I2510 Atherosclerotic heart disease of native coronary artery without angina pectoris: Secondary | ICD-10-CM | POA: Diagnosis not present

## 2018-03-17 DIAGNOSIS — K219 Gastro-esophageal reflux disease without esophagitis: Secondary | ICD-10-CM | POA: Diagnosis not present

## 2018-03-17 DIAGNOSIS — E1140 Type 2 diabetes mellitus with diabetic neuropathy, unspecified: Secondary | ICD-10-CM | POA: Diagnosis not present

## 2018-04-02 MED ORDER — FINASTERIDE 5 MG PO TABS: 5.0000 mg | ORAL_TABLET | Freq: Every day | ORAL | 4 refills | Status: DC

## 2018-04-03 DIAGNOSIS — M25562 Pain in left knee: Secondary | ICD-10-CM | POA: Diagnosis not present

## 2018-04-03 DIAGNOSIS — Z6841 Body Mass Index (BMI) 40.0 and over, adult: Secondary | ICD-10-CM | POA: Insufficient documentation

## 2018-04-03 DIAGNOSIS — M1712 Unilateral primary osteoarthritis, left knee: Secondary | ICD-10-CM | POA: Diagnosis not present

## 2018-04-03 DIAGNOSIS — G8929 Other chronic pain: Secondary | ICD-10-CM | POA: Diagnosis not present

## 2018-04-23 ENCOUNTER — Other Ambulatory Visit: Payer: Self-pay

## 2018-04-24 ENCOUNTER — Inpatient Hospital Stay: Payer: Medicare HMO | Attending: Radiation Oncology

## 2018-04-24 ENCOUNTER — Other Ambulatory Visit: Payer: Self-pay

## 2018-04-24 DIAGNOSIS — C61 Malignant neoplasm of prostate: Secondary | ICD-10-CM | POA: Insufficient documentation

## 2018-04-24 LAB — PSA: Prostatic Specific Antigen: 0.03 ng/mL (ref 0.00–4.00)

## 2018-04-30 ENCOUNTER — Other Ambulatory Visit: Payer: Self-pay

## 2018-04-30 DIAGNOSIS — R635 Abnormal weight gain: Secondary | ICD-10-CM | POA: Diagnosis not present

## 2018-04-30 DIAGNOSIS — I10 Essential (primary) hypertension: Secondary | ICD-10-CM | POA: Diagnosis not present

## 2018-04-30 DIAGNOSIS — E1140 Type 2 diabetes mellitus with diabetic neuropathy, unspecified: Secondary | ICD-10-CM | POA: Diagnosis not present

## 2018-05-01 ENCOUNTER — Encounter: Payer: Self-pay | Admitting: Radiation Oncology

## 2018-05-01 ENCOUNTER — Other Ambulatory Visit: Payer: Self-pay

## 2018-05-01 ENCOUNTER — Ambulatory Visit
Admission: RE | Admit: 2018-05-01 | Discharge: 2018-05-01 | Disposition: A | Discharge status: Home / Self Care | Payer: Medicare HMO | Source: Ambulatory Visit | Attending: Radiation Oncology | Admitting: Radiation Oncology

## 2018-05-01 VITALS — BP 152/76 | HR 95 | Temp 98.3°F | Resp 20 | Wt 266.9 lb

## 2018-05-01 DIAGNOSIS — R351 Nocturia: Secondary | ICD-10-CM | POA: Diagnosis not present

## 2018-05-01 DIAGNOSIS — R3915 Urgency of urination: Secondary | ICD-10-CM | POA: Insufficient documentation

## 2018-05-01 DIAGNOSIS — Z923 Personal history of irradiation: Secondary | ICD-10-CM | POA: Insufficient documentation

## 2018-05-01 DIAGNOSIS — R350 Frequency of micturition: Secondary | ICD-10-CM | POA: Insufficient documentation

## 2018-05-01 DIAGNOSIS — C61 Malignant neoplasm of prostate: Secondary | ICD-10-CM

## 2018-05-01 NOTE — Progress Notes (Signed)
Radiation Oncology Follow up Note  Name: Taylor Austin   Date:   05/01/2018 MRN:  [id number] DOB: 03-Jan-1941    This 78 y.o. male presents to the clinic today for 59-month follow-up status post IMRT radiation therapy for stage IIa Gleason 7 (4+3) adenocarcinoma the prostate.  REFERRING PROVIDER: Juluis Pitch, MD  HPI: Patient is a 78 year old male now at 4 months having completed IMRT radiation therapy to his prostate for stage IIa adenocarcinoma the prostate Gleason score of 7 (4+3).  Seen today in routine follow-up he is doing well.  He still has some urgency and frequency and nocturia x3-4 although he continues to be on Flomax.  His most recent PSA was 0.03..  I believe he has been receiving his androgen deprivation therapy for Dr. Cherrie Gauze office which will continue.  COMPLICATIONS OF TREATMENT: none  FOLLOW UP COMPLIANCE: keeps appointments   PHYSICAL EXAM:  BP (!) 152/76 (BP Location: Left Arm, Patient Position: Sitting)   Pulse 95   Temp 98.3 F (36.8 C)   Resp 20   Wt 266 lb 13.9 oz (121.1 kg)   BMI 44.41 kg/m  Old obese male in NAD.  Well-developed well-nourished patient in NAD. HEENT reveals PERLA, EOMI, discs not visualized.  Oral cavity is clear. No oral mucosal lesions are identified. Neck is clear without evidence of cervical or supraclavicular adenopathy. Lungs are clear to A&P. Cardiac examination is essentially unremarkable with regular rate and rhythm without murmur rub or thrill. Abdomen is benign with no organomegaly or masses noted. Motor sensory and DTR levels are equal and symmetric in the upper and lower extremities. Cranial nerves II through XII are grossly intact. Proprioception is intact. No peripheral adenopathy or edema is identified. No motor or sensory levels are noted. Crude visual fields are within normal range.  RADIOLOGY RESULTS: No current films for review  PLAN: Present time patient is under excellent biochemical control of his prostate cancer.   He continues on androgen deprivation therapy through Dr. Cherrie Gauze office.  I have asked to see him back in 6 months for follow-up with a PSA prior to that visit.  Patient knows to call sooner with any concerns at any time.  I would like to take this opportunity to thank you for allowing me to participate in the care of your patient.Noreene Filbert, MD

## 2018-05-03 ENCOUNTER — Encounter: Payer: Self-pay | Admitting: Urology

## 2018-05-08 DIAGNOSIS — E1140 Type 2 diabetes mellitus with diabetic neuropathy, unspecified: Secondary | ICD-10-CM | POA: Diagnosis not present

## 2018-05-08 DIAGNOSIS — E1159 Type 2 diabetes mellitus with other circulatory complications: Secondary | ICD-10-CM | POA: Diagnosis not present

## 2018-05-08 DIAGNOSIS — E1169 Type 2 diabetes mellitus with other specified complication: Secondary | ICD-10-CM | POA: Diagnosis not present

## 2018-05-08 DIAGNOSIS — I10 Essential (primary) hypertension: Secondary | ICD-10-CM | POA: Diagnosis not present

## 2018-05-08 DIAGNOSIS — E785 Hyperlipidemia, unspecified: Secondary | ICD-10-CM | POA: Diagnosis not present

## 2018-08-07 DIAGNOSIS — E1140 Type 2 diabetes mellitus with diabetic neuropathy, unspecified: Secondary | ICD-10-CM | POA: Diagnosis not present

## 2018-08-07 DIAGNOSIS — E785 Hyperlipidemia, unspecified: Secondary | ICD-10-CM | POA: Diagnosis not present

## 2018-08-07 DIAGNOSIS — E1169 Type 2 diabetes mellitus with other specified complication: Secondary | ICD-10-CM | POA: Diagnosis not present

## 2018-08-13 DIAGNOSIS — F0150 Vascular dementia without behavioral disturbance: Secondary | ICD-10-CM | POA: Diagnosis not present

## 2018-08-13 DIAGNOSIS — G8929 Other chronic pain: Secondary | ICD-10-CM | POA: Diagnosis not present

## 2018-08-13 DIAGNOSIS — I69328 Other speech and language deficits following cerebral infarction: Secondary | ICD-10-CM | POA: Diagnosis not present

## 2018-08-13 DIAGNOSIS — G629 Polyneuropathy, unspecified: Secondary | ICD-10-CM | POA: Diagnosis not present

## 2018-08-13 DIAGNOSIS — M25562 Pain in left knee: Secondary | ICD-10-CM | POA: Diagnosis not present

## 2018-08-14 DIAGNOSIS — E1159 Type 2 diabetes mellitus with other circulatory complications: Secondary | ICD-10-CM | POA: Diagnosis not present

## 2018-08-14 DIAGNOSIS — E785 Hyperlipidemia, unspecified: Secondary | ICD-10-CM | POA: Diagnosis not present

## 2018-08-14 DIAGNOSIS — E1140 Type 2 diabetes mellitus with diabetic neuropathy, unspecified: Secondary | ICD-10-CM | POA: Diagnosis not present

## 2018-08-14 DIAGNOSIS — I10 Essential (primary) hypertension: Secondary | ICD-10-CM | POA: Diagnosis not present

## 2018-08-14 DIAGNOSIS — E1169 Type 2 diabetes mellitus with other specified complication: Secondary | ICD-10-CM | POA: Diagnosis not present

## 2018-08-21 ENCOUNTER — Ambulatory Visit: Payer: Medicare Other

## 2018-09-10 DIAGNOSIS — R0600 Dyspnea, unspecified: Secondary | ICD-10-CM | POA: Diagnosis not present

## 2018-09-10 DIAGNOSIS — G4733 Obstructive sleep apnea (adult) (pediatric): Secondary | ICD-10-CM | POA: Diagnosis not present

## 2018-09-10 DIAGNOSIS — J439 Emphysema, unspecified: Secondary | ICD-10-CM | POA: Diagnosis not present

## 2018-09-16 DIAGNOSIS — M5417 Radiculopathy, lumbosacral region: Secondary | ICD-10-CM | POA: Diagnosis not present

## 2018-09-17 ENCOUNTER — Other Ambulatory Visit: Payer: Self-pay | Admitting: Neurology

## 2018-09-17 DIAGNOSIS — M5417 Radiculopathy, lumbosacral region: Secondary | ICD-10-CM

## 2018-09-17 DIAGNOSIS — E785 Hyperlipidemia, unspecified: Secondary | ICD-10-CM | POA: Diagnosis not present

## 2018-09-17 DIAGNOSIS — I10 Essential (primary) hypertension: Secondary | ICD-10-CM | POA: Diagnosis not present

## 2018-09-17 DIAGNOSIS — N400 Enlarged prostate without lower urinary tract symptoms: Secondary | ICD-10-CM | POA: Diagnosis not present

## 2018-09-17 DIAGNOSIS — E669 Obesity, unspecified: Secondary | ICD-10-CM | POA: Diagnosis not present

## 2018-09-17 DIAGNOSIS — J449 Chronic obstructive pulmonary disease, unspecified: Secondary | ICD-10-CM | POA: Diagnosis not present

## 2018-09-17 DIAGNOSIS — E119 Type 2 diabetes mellitus without complications: Secondary | ICD-10-CM | POA: Diagnosis not present

## 2018-09-17 DIAGNOSIS — Z7982 Long term (current) use of aspirin: Secondary | ICD-10-CM | POA: Diagnosis not present

## 2018-09-17 DIAGNOSIS — I2510 Atherosclerotic heart disease of native coronary artery without angina pectoris: Secondary | ICD-10-CM | POA: Diagnosis not present

## 2018-09-17 DIAGNOSIS — Z6841 Body Mass Index (BMI) 40.0 and over, adult: Secondary | ICD-10-CM | POA: Diagnosis not present

## 2018-09-17 DIAGNOSIS — M62562 Muscle wasting and atrophy, not elsewhere classified, left lower leg: Secondary | ICD-10-CM

## 2018-09-18 DIAGNOSIS — J449 Chronic obstructive pulmonary disease, unspecified: Secondary | ICD-10-CM | POA: Insufficient documentation

## 2018-09-24 ENCOUNTER — Other Ambulatory Visit: Payer: Self-pay

## 2018-09-24 ENCOUNTER — Ambulatory Visit
Admission: RE | Admit: 2018-09-24 | Discharge: 2018-09-24 | Disposition: A | Discharge status: Home / Self Care | Payer: Medicare HMO | Source: Ambulatory Visit | Attending: Neurology | Admitting: Neurology

## 2018-09-24 DIAGNOSIS — M62562 Muscle wasting and atrophy, not elsewhere classified, left lower leg: Secondary | ICD-10-CM | POA: Diagnosis not present

## 2018-09-24 DIAGNOSIS — M5417 Radiculopathy, lumbosacral region: Secondary | ICD-10-CM | POA: Insufficient documentation

## 2018-09-24 DIAGNOSIS — M545 Low back pain: Secondary | ICD-10-CM | POA: Diagnosis not present

## 2018-10-07 DIAGNOSIS — G8929 Other chronic pain: Secondary | ICD-10-CM | POA: Diagnosis not present

## 2018-10-07 DIAGNOSIS — M545 Low back pain: Secondary | ICD-10-CM | POA: Diagnosis not present

## 2018-10-15 DIAGNOSIS — G8929 Other chronic pain: Secondary | ICD-10-CM | POA: Diagnosis not present

## 2018-10-15 DIAGNOSIS — M545 Low back pain: Secondary | ICD-10-CM | POA: Diagnosis not present

## 2018-10-15 DIAGNOSIS — R531 Weakness: Secondary | ICD-10-CM | POA: Diagnosis not present

## 2018-10-22 DIAGNOSIS — M545 Low back pain: Secondary | ICD-10-CM | POA: Diagnosis not present

## 2018-10-22 DIAGNOSIS — G8929 Other chronic pain: Secondary | ICD-10-CM | POA: Diagnosis not present

## 2018-10-23 ENCOUNTER — Inpatient Hospital Stay: Payer: Medicare HMO | Attending: Radiation Oncology

## 2018-10-23 ENCOUNTER — Other Ambulatory Visit: Payer: Self-pay

## 2018-10-23 DIAGNOSIS — C61 Malignant neoplasm of prostate: Secondary | ICD-10-CM | POA: Diagnosis not present

## 2018-10-23 LAB — PSA: Prostatic Specific Antigen: 0.1 ng/mL (ref 0.00–4.00)

## 2018-10-29 ENCOUNTER — Other Ambulatory Visit: Payer: Self-pay

## 2018-10-30 ENCOUNTER — Other Ambulatory Visit: Payer: Self-pay

## 2018-10-30 ENCOUNTER — Ambulatory Visit
Admission: RE | Admit: 2018-10-30 | Discharge: 2018-10-30 | Disposition: A | Discharge status: Home / Self Care | Payer: Medicare HMO | Source: Ambulatory Visit | Attending: Radiation Oncology | Admitting: Radiation Oncology

## 2018-10-30 ENCOUNTER — Encounter: Payer: Self-pay | Admitting: Radiation Oncology

## 2018-10-30 VITALS — BP 123/91 | HR 98 | Temp 98.0°F | Wt 276.4 lb

## 2018-10-30 DIAGNOSIS — C61 Malignant neoplasm of prostate: Secondary | ICD-10-CM | POA: Insufficient documentation

## 2018-10-30 DIAGNOSIS — Z923 Personal history of irradiation: Secondary | ICD-10-CM | POA: Insufficient documentation

## 2018-10-30 NOTE — Progress Notes (Signed)
Radiation Oncology Follow up Note  Name: Taylor Austin   Date:   10/30/2018 MRN:  [id number] DOB: 1940/11/26    This 78 y.o. male presents to the clinic today for 9-month follow-up status post IMRT radiation therapy for a Gleason 7 (4+3) adenocarcinoma the prostate stage IIa.  REFERRING PROVIDER: Juluis Pitch, MD  HPI: Patient is a 78 year old male now at 10 months having completed IMRT radiation therapy for a stage IIa Gleason 7 (4+3) adenocarcinoma the prostate.  He is seen today in routine follow-up is doing well he has nocturia x3 although has discontinued his Flomax I have offered to refill that prescription as needed.  He specifically denies any bowel problems or fatigue.  His most recent PSA is 0.1.Marland Kitchen  Patient is been having some back pain back in September had an MRI scan of his spine showing spondylosis worse at L3-4 no evidence to suggest metastatic disease.  COMPLICATIONS OF TREATMENT: none  FOLLOW UP COMPLIANCE: keeps appointments   PHYSICAL EXAM:  BP (!) 123/91   Pulse 98   Temp 98 F (36.7 C)   Wt 276 lb 6.4 oz (125.4 kg)   BMI 46.00 kg/m  Well-developed well-nourished patient in NAD. HEENT reveals PERLA, EOMI, discs not visualized.  Oral cavity is clear. No oral mucosal lesions are identified. Neck is clear without evidence of cervical or supraclavicular adenopathy. Lungs are clear to A&P. Cardiac examination is essentially unremarkable with regular rate and rhythm without murmur rub or thrill. Abdomen is benign with no organomegaly or masses noted. Motor sensory and DTR levels are equal and symmetric in the upper and lower extremities. Cranial nerves II through XII are grossly intact. Proprioception is intact. No peripheral adenopathy or edema is identified. No motor or sensory levels are noted. Crude visual fields are within normal range.  RADIOLOGY RESULTS: MRI of his lumbar spine reviewed  PLAN: Present time patient is doing well under excellent biochemical  control of his prostate cancer.  Of asked to see him at 1 year for follow-up with a PSA prior to that visit.  Patient knows to call sooner with any concerns.  I would like to take this opportunity to thank you for allowing me to participate in the care of your patient.Noreene Filbert, MD

## 2018-10-31 DIAGNOSIS — R531 Weakness: Secondary | ICD-10-CM | POA: Diagnosis not present

## 2018-10-31 DIAGNOSIS — G8929 Other chronic pain: Secondary | ICD-10-CM | POA: Diagnosis not present

## 2018-10-31 DIAGNOSIS — M545 Low back pain: Secondary | ICD-10-CM | POA: Diagnosis not present

## 2018-11-05 DIAGNOSIS — G8929 Other chronic pain: Secondary | ICD-10-CM | POA: Diagnosis not present

## 2018-11-05 DIAGNOSIS — M545 Low back pain: Secondary | ICD-10-CM | POA: Diagnosis not present

## 2018-11-13 DIAGNOSIS — E1140 Type 2 diabetes mellitus with diabetic neuropathy, unspecified: Secondary | ICD-10-CM | POA: Diagnosis not present

## 2018-11-20 DIAGNOSIS — E1169 Type 2 diabetes mellitus with other specified complication: Secondary | ICD-10-CM | POA: Diagnosis not present

## 2018-11-20 DIAGNOSIS — E1159 Type 2 diabetes mellitus with other circulatory complications: Secondary | ICD-10-CM | POA: Diagnosis not present

## 2018-11-20 DIAGNOSIS — E1140 Type 2 diabetes mellitus with diabetic neuropathy, unspecified: Secondary | ICD-10-CM | POA: Diagnosis not present

## 2018-11-20 DIAGNOSIS — I10 Essential (primary) hypertension: Secondary | ICD-10-CM | POA: Diagnosis not present

## 2018-11-20 DIAGNOSIS — E785 Hyperlipidemia, unspecified: Secondary | ICD-10-CM | POA: Diagnosis not present

## 2019-02-02 ENCOUNTER — Ambulatory Visit: Payer: Medicare HMO | Attending: Internal Medicine

## 2019-02-02 DIAGNOSIS — Z23 Encounter for immunization: Secondary | ICD-10-CM | POA: Insufficient documentation

## 2019-02-02 NOTE — Progress Notes (Signed)
   Covid-19 Vaccination Clinic  Name:  Taylor Austin    MRN: [id number] DOB: 1940/01/28  02/02/2019  Mr. Covin was observed post Covid-19 immunization for 30 minutes based on pre-vaccination screening without incidence. He was provided with Vaccine Information Sheet and instruction to access the V-Safe system.   Mr. Peterson was instructed to call 911 with any severe reactions post vaccine: Marland Kitchen Difficulty breathing  . Swelling of your face and throat  . A fast heartbeat  . A bad rash all over your body  . Dizziness and weakness    Immunizations Administered    Name Date Dose VIS Date Route   Pfizer COVID-19 Vaccine 02/02/2019  9:09 AM 0.3 mL 12/19/2018 Intramuscular   Manufacturer: [place]   Lot: [id number]   [place]: [id number]

## 2019-02-12 DIAGNOSIS — K219 Gastro-esophageal reflux disease without esophagitis: Secondary | ICD-10-CM | POA: Diagnosis not present

## 2019-02-12 DIAGNOSIS — Z8719 Personal history of other diseases of the digestive system: Secondary | ICD-10-CM | POA: Diagnosis not present

## 2019-02-12 DIAGNOSIS — K2950 Unspecified chronic gastritis without bleeding: Secondary | ICD-10-CM | POA: Diagnosis not present

## 2019-02-19 DIAGNOSIS — I10 Essential (primary) hypertension: Secondary | ICD-10-CM | POA: Diagnosis not present

## 2019-02-19 DIAGNOSIS — E785 Hyperlipidemia, unspecified: Secondary | ICD-10-CM | POA: Diagnosis not present

## 2019-02-19 DIAGNOSIS — E1140 Type 2 diabetes mellitus with diabetic neuropathy, unspecified: Secondary | ICD-10-CM | POA: Diagnosis not present

## 2019-02-19 DIAGNOSIS — E1159 Type 2 diabetes mellitus with other circulatory complications: Secondary | ICD-10-CM | POA: Diagnosis not present

## 2019-02-19 DIAGNOSIS — E1169 Type 2 diabetes mellitus with other specified complication: Secondary | ICD-10-CM | POA: Diagnosis not present

## 2019-02-23 ENCOUNTER — Ambulatory Visit: Payer: Medicare HMO | Attending: Internal Medicine

## 2019-02-23 DIAGNOSIS — Z23 Encounter for immunization: Secondary | ICD-10-CM | POA: Insufficient documentation

## 2019-02-23 NOTE — Progress Notes (Signed)
   Covid-19 Vaccination Clinic  Name:  Taylor Austin    MRN: [id number] DOB: October 14, 1940  02/23/2019  Taylor Austin was observed post Covid-19 immunization for 15 minutes without incidence. He was provided with Vaccine Information Sheet and instruction to access the V-Safe system.   Taylor Austin was instructed to call 911 with any severe reactions post vaccine: Marland Kitchen Difficulty breathing  . Swelling of your face and throat  . A fast heartbeat  . A bad rash all over your body  . Dizziness and weakness    Immunizations Administered    Name Date Dose VIS Date Route   Pfizer COVID-19 Vaccine 02/23/2019  9:23 AM 0.3 mL 12/19/2018 Intramuscular   Manufacturer: [place]   Lot: [id number]   [place]: [id number]

## 2019-03-11 DIAGNOSIS — R079 Chest pain, unspecified: Secondary | ICD-10-CM | POA: Diagnosis not present

## 2019-03-11 DIAGNOSIS — Z1331 Encounter for screening for depression: Secondary | ICD-10-CM | POA: Diagnosis not present

## 2019-03-11 DIAGNOSIS — G4733 Obstructive sleep apnea (adult) (pediatric): Secondary | ICD-10-CM | POA: Diagnosis not present

## 2019-03-11 DIAGNOSIS — Z01818 Encounter for other preprocedural examination: Secondary | ICD-10-CM | POA: Diagnosis not present

## 2019-03-11 DIAGNOSIS — G8929 Other chronic pain: Secondary | ICD-10-CM | POA: Diagnosis not present

## 2019-03-11 DIAGNOSIS — J449 Chronic obstructive pulmonary disease, unspecified: Secondary | ICD-10-CM | POA: Diagnosis not present

## 2019-03-11 DIAGNOSIS — R0600 Dyspnea, unspecified: Secondary | ICD-10-CM | POA: Diagnosis not present

## 2019-03-19 DIAGNOSIS — E119 Type 2 diabetes mellitus without complications: Secondary | ICD-10-CM | POA: Diagnosis not present

## 2019-04-02 DIAGNOSIS — E1159 Type 2 diabetes mellitus with other circulatory complications: Secondary | ICD-10-CM | POA: Diagnosis not present

## 2019-04-02 DIAGNOSIS — E1140 Type 2 diabetes mellitus with diabetic neuropathy, unspecified: Secondary | ICD-10-CM | POA: Diagnosis not present

## 2019-04-02 DIAGNOSIS — E785 Hyperlipidemia, unspecified: Secondary | ICD-10-CM | POA: Diagnosis not present

## 2019-04-02 DIAGNOSIS — I10 Essential (primary) hypertension: Secondary | ICD-10-CM | POA: Diagnosis not present

## 2019-04-02 DIAGNOSIS — E1169 Type 2 diabetes mellitus with other specified complication: Secondary | ICD-10-CM | POA: Diagnosis not present

## 2019-04-06 DIAGNOSIS — E785 Hyperlipidemia, unspecified: Secondary | ICD-10-CM | POA: Diagnosis not present

## 2019-04-06 DIAGNOSIS — L853 Xerosis cutis: Secondary | ICD-10-CM | POA: Diagnosis not present

## 2019-04-06 DIAGNOSIS — I2510 Atherosclerotic heart disease of native coronary artery without angina pectoris: Secondary | ICD-10-CM | POA: Diagnosis not present

## 2019-04-06 DIAGNOSIS — J449 Chronic obstructive pulmonary disease, unspecified: Secondary | ICD-10-CM | POA: Diagnosis not present

## 2019-04-06 DIAGNOSIS — G4733 Obstructive sleep apnea (adult) (pediatric): Secondary | ICD-10-CM | POA: Diagnosis not present

## 2019-04-06 DIAGNOSIS — N401 Enlarged prostate with lower urinary tract symptoms: Secondary | ICD-10-CM | POA: Diagnosis not present

## 2019-04-06 DIAGNOSIS — I10 Essential (primary) hypertension: Secondary | ICD-10-CM | POA: Diagnosis not present

## 2019-04-06 DIAGNOSIS — E1142 Type 2 diabetes mellitus with diabetic polyneuropathy: Secondary | ICD-10-CM | POA: Diagnosis not present

## 2019-04-06 DIAGNOSIS — Z0000 Encounter for general adult medical examination without abnormal findings: Secondary | ICD-10-CM | POA: Diagnosis not present

## 2019-04-12 ENCOUNTER — Other Ambulatory Visit: Payer: Self-pay | Admitting: Urology

## 2019-04-20 NOTE — Progress Notes (Signed)
04/21/19 12:50 PM   Taylor Austin Chauncey Cruel 06-May-1940 [id number]  Referring provider: Juluis Pitch, MD [phone] [employer],  [place] [id number]  Chief Complaint  Patient presents with  . Prostate Cancer    HPI: Taylor Austin is a 79 y.o. M with hx of diabetes returns today for the evaluation and management of prostate cancer. Pt seemed mildly of tangential today, pleasant.  Prostate Cancer Intermediate Gleason 4+3 prostate cancer s/p  IMRT in 12/2017.   He underwent prostate biopsy on 08/13/2017 due to rising PSA despite being on finasteride. His PSA at the time was 3.3.  Prostate biopsy revealed only 2 cores positive of disease, 2% of the left base described as 4+3 prostate cancer as well as Gleason 3+3 at the left mid involving only 2%. There is also a suspicious lesion in the left apex but this was nondiagnostic.  TRUS vol 29 cc.  Completed 6 month Lupron depot on 02/18/2018.   PSA today is ordered/ pending.  Dysfunctional Voiding He underwent cystoscopy 01/2017 with 3 to 4 cm which was somewhat obstructive without a significant median lobe or bladder pathology.  TRUS volume 29 cc.  UDS 12/2017  indicated that he may also have dysfunctional voiding and OAB. Previously tried multiple medications including anticholinergics, finasteride, Myrbetriq, PTNS without any improvement.  He underwent cystoscopy 01/2017 with 3 to 4 cm which was somewhat obstructive without a significant median lobe or bladder pathology. TRUS volume 29 cc.  He reports of nocturia 3-4x, frequency during the day, and slow stream. He is not bothered enough by his symptoms to work with PT. He is currently on Flomax.    Denies gross hematuria, infections or burning with urination.   OAB Refractory overactivity symptoms, little bit from previous interventions (Myrbetriq, anticholinergics and PTNS). See above.  PMH: Past Medical History:  Diagnosis Date  . Allergy   . Anemia   . Arthritis   . Back  pain   . Barrett esophagus   . BPH (benign prostatic hyperplasia)   . Chronic kidney disease   . COPD (chronic obstructive pulmonary disease) ([place])   . Coronary artery disease   . Depression   . Diabetes mellitus without complication ([place])   . Diverticulitis   . Dysphagia   . Esophageal reflux   . Esophageal reflux   . GERD (gastroesophageal reflux disease)   . Heart murmur   . Hyperlipidemia   . Hypersomnia   . Hypertension   . Kidney stones   . Low testosterone   . OAB (overactive bladder)   . Obesity   . Presence of dental bridge    2 - top  . Rheumatic fever   . Sleep apnea    BiPAP  . Stroke ([place]) 01/2016  . Vertigo   . Vitamin B12 deficiency     Surgical History: Past Surgical History:  Procedure Laterality Date  . BACK SURGERY    . CARDIAC CATHETERIZATION  08/11/2014   Procedure: Left Heart Cath and Coronary Angiography;  Surgeon: Teodoro Spray, MD;  Location: [facility];  Service: Cardiovascular;;  . CARDIAC CATHETERIZATION N/A 11/17/2014   Procedure: Right Heart Cath;  Surgeon: Teodoro Spray, MD;  Location: [place];  Service: Cardiovascular;  Laterality: N/A;  . CAROTID STENT    . CATARACT EXTRACTION W/PHACO Left 04/03/2016   Procedure: CATARACT EXTRACTION PHACO AND INTRAOCULAR LENS PLACEMENT (IOC) left diabetic;  Surgeon: Eulogio Bear, MD;  Location: [facility];  Service:  Ophthalmology;  Laterality: Left;  diabetic - oral meds sleep apnea  . CATARACT EXTRACTION W/PHACO Right 05/01/2016   Procedure: CATARACT EXTRACTION PHACO AND INTRAOCULAR LENS PLACEMENT ([place]) Right diabetic;  Surgeon: Eulogio Bear, MD;  Location: [facility];  Service: Ophthalmology;  Laterality: Right;  Diabetic oral meds sleep apnea  . CERVICAL SPINE SURGERY    . COLONOSCOPY    . COLONOSCOPY WITH PROPOFOL N/A 06/10/2015   Procedure: COLONOSCOPY WITH PROPOFOL;  Surgeon: Manya Silvas, MD;  [facility] ENDOSCOPY;  Service: Endoscopy;   Laterality: N/A;  . CORONARY ANGIOPLASTY    . ESOPHAGOGASTRODUODENOSCOPY (EGD) WITH PROPOFOL N/A 06/28/2017   Procedure: ESOPHAGOGASTRODUODENOSCOPY (EGD) WITH PROPOFOL;  Surgeon: Manya Silvas, MD;  Location[facility]OSCOPY;  Service: Endoscopy;  Laterality: N/A;  . ESOPHAGOGASTRODUODENOSCOPY ENDOSCOPY    . EYE SURGERY    . LUMBAR SPINE SURGERY    . THORACIC SPINE SURGERY    . TONSILLECTOMY      Home Medications:  Allergies as of 04/21/2019      Reactions   Hydrocodone Shortness Of Breath   Oxycodone-acetaminophen Shortness Of Breath      Medication List       Accurate as of April 21, 2019 11:59 PM. If you have any questions, ask your nurse or doctor.        Accu-Chek Aviva Plus test strip Generic drug: glucose blood USE STRIP TO CHECK GLUCOSE ONCE DAILY   albuterol 108 (90 Base) MCG/ACT inhaler Commonly known as: VENTOLIN HFA Inhale into the lungs every 6 (six) hours as needed for wheezing or shortness of breath.   Anoro Ellipta 62.5-25 MCG/INH Aepb Generic drug: umeclidinium-vilanterol Inhale 1 puff into the lungs daily.   aspirin EC 81 MG tablet Take 81 mg by mouth daily.   atorvastatin 40 MG tablet Commonly known as: LIPITOR TAKE 1 TABLET BY MOUTH ONCE DAILY   clopidogrel 75 MG tablet Commonly known as: PLAVIX Take 1 tablet (75 mg total) by mouth daily.   donepezil 10 MG tablet Commonly known as: ARICEPT Take by mouth.   DULoxetine 60 MG capsule Commonly known as: CYMBALTA   finasteride 5 MG tablet Commonly known as: PROSCAR TAKE 1 TABLET EVERY DAY   gabapentin 100 MG capsule Commonly known as: NEURONTIN Take 100 mg by mouth 3 (three) times daily.   glimepiride 2 MG tablet Commonly known as: AMARYL Take 2 mg by mouth daily with breakfast.   glyBURIDE-metformin 5-500 MG tablet Commonly known as: GLUCOVANCE TAKE ONE TABLET BY MOUTH TWICE DAILY WITH MEALS   losartan 100 MG tablet Commonly known as: COZAAR Take 100 mg by mouth daily.     magnesium oxide 400 MG tablet Commonly known as: MAG-OX Take 400 mg by mouth 2 (two) times daily.   metFORMIN 500 MG 24 hr tablet Commonly known as: GLUCOPHAGE-XR Take by mouth.   Multi-Vitamins Tabs Take 1 tablet by mouth daily.   oxymetazoline 0.05 % nasal spray Commonly known as: AFRIN Place 1 spray into both nostrils 2 (two) times daily.   pantoprazole 40 MG tablet Commonly known as: PROTONIX Take by mouth.   pioglitazone 45 MG tablet Commonly known as: ACTOS Take 45 mg by mouth daily.   PROBIOTIC PO Take by mouth.   simvastatin 40 MG tablet Commonly known as: ZOCOR Take 40 mg by mouth daily at 6 PM.   tamsulosin 0.4 MG Caps capsule Commonly known as: FLOMAX Take 1 capsule (0.4 mg total) by mouth daily after supper.   VITAMIN B-12  PO Take by mouth.       Allergies:  Allergies  Allergen Reactions  . Hydrocodone Shortness Of Breath  . Oxycodone-Acetaminophen Shortness Of Breath    Family History: Family History  Problem Relation Age of Onset  . Stroke Father   . Anesthesia problems Father   . Anesthesia problems Mother   . Prostate cancer Brother   . Kidney disease Neg Hx   . Bladder Cancer Neg Hx     Social History:  reports that he quit smoking about 45 years ago. His smoking use included cigarettes. He has never used smokeless tobacco. He reports current alcohol use of about 1.0 standard drinks of alcohol per week. He reports that he does not use drugs.   Physical Exam: BP 118/74   Pulse (!) 103   Ht 5\' 5"  (1.651 m)   Wt 260 lb (117.9 kg)   BMI 43.27 kg/m   Constitutional:  Alert and oriented, No acute distress. HEENT: [place] AT, moist mucus membranes.  Trachea midline, no masses. Cardiovascular: No clubbing, cyanosis, or edema. Respiratory: Normal respiratory effort, no increased work of breathing. Skin: No rashes, bruises or suspicious lesions. Neurologic: Grossly intact, no focal deficits, moving all 4 extremities. Psychiatric: Normal  mood and affect.  Assessment & Plan:    1. Prostate cancer  Completed 6 month Lupron depot on 02/18/2018  Most recent PSA 0.1 on 10/2018  Repeat PSA today, will call with results  Recommended PSA q 6 months annually  Return for PSA only in 6 months and in 1 year for MD visit with PSA   2. Urinary Frequency/Urgency Continue Flomax. Will continue for the forseeable future.   3. Dysfunctional voiding of urine Reiterated behavioral modifications including blood sugar control and PT referral  Strongly recommended PT which he declined for his known pelvic floor dysfunction.  Continue Flomax   6 months for PSA only, MD in 12 months for PSA/ DRE/ IPSS/ PVR unless symptoms worsen  [facility] [address] [place], [place] [address] [phone]  I, Lucas Mallow, am acting as a scribe for Dr. Hollice Espy,  I have reviewed the above documentation for accuracy and completeness, and I agree with the above.   Hollice Espy, MD

## 2019-04-21 ENCOUNTER — Other Ambulatory Visit: Payer: Self-pay

## 2019-04-21 ENCOUNTER — Ambulatory Visit: Payer: Medicare HMO | Admitting: Urology

## 2019-04-21 ENCOUNTER — Encounter: Payer: Self-pay | Admitting: Urology

## 2019-04-21 VITALS — BP 118/74 | HR 103 | Ht 65.0 in | Wt 260.0 lb

## 2019-04-21 DIAGNOSIS — C61 Malignant neoplasm of prostate: Secondary | ICD-10-CM

## 2019-04-22 LAB — PSA: Prostate Specific Ag, Serum: <0.1 ng/mL (ref 0.0–4.0)

## 2019-04-23 ENCOUNTER — Telehealth: Payer: Self-pay | Admitting: *Deleted

## 2019-04-23 NOTE — Telephone Encounter (Addendum)
Patient informed, voiced understanding.   ----- Message from Hollice Espy, MD sent at 04/22/2019 12:00 PM EDT ----- Psa is undetectable, great news!

## 2019-05-06 ENCOUNTER — Other Ambulatory Visit: Payer: Self-pay | Admitting: Urology

## 2019-05-31 ENCOUNTER — Other Ambulatory Visit: Payer: Self-pay | Admitting: Urology

## 2019-07-08 DIAGNOSIS — E1140 Type 2 diabetes mellitus with diabetic neuropathy, unspecified: Secondary | ICD-10-CM | POA: Diagnosis not present

## 2019-07-08 DIAGNOSIS — E1159 Type 2 diabetes mellitus with other circulatory complications: Secondary | ICD-10-CM | POA: Diagnosis not present

## 2019-07-08 DIAGNOSIS — I152 Hypertension secondary to endocrine disorders: Secondary | ICD-10-CM | POA: Diagnosis not present

## 2019-07-08 DIAGNOSIS — E1169 Type 2 diabetes mellitus with other specified complication: Secondary | ICD-10-CM | POA: Diagnosis not present

## 2019-07-08 DIAGNOSIS — E785 Hyperlipidemia, unspecified: Secondary | ICD-10-CM | POA: Diagnosis not present

## 2019-09-28 ENCOUNTER — Encounter: Payer: Self-pay | Admitting: *Deleted

## 2019-10-09 DIAGNOSIS — N401 Enlarged prostate with lower urinary tract symptoms: Secondary | ICD-10-CM | POA: Diagnosis not present

## 2019-10-09 DIAGNOSIS — I10 Essential (primary) hypertension: Secondary | ICD-10-CM | POA: Diagnosis not present

## 2019-10-09 DIAGNOSIS — E1140 Type 2 diabetes mellitus with diabetic neuropathy, unspecified: Secondary | ICD-10-CM | POA: Diagnosis not present

## 2019-10-09 DIAGNOSIS — R413 Other amnesia: Secondary | ICD-10-CM | POA: Insufficient documentation

## 2019-10-09 DIAGNOSIS — E1169 Type 2 diabetes mellitus with other specified complication: Secondary | ICD-10-CM | POA: Diagnosis not present

## 2019-10-09 DIAGNOSIS — E785 Hyperlipidemia, unspecified: Secondary | ICD-10-CM | POA: Diagnosis not present

## 2019-10-09 DIAGNOSIS — Z8546 Personal history of malignant neoplasm of prostate: Secondary | ICD-10-CM | POA: Diagnosis not present

## 2019-10-19 DIAGNOSIS — C61 Malignant neoplasm of prostate: Secondary | ICD-10-CM | POA: Diagnosis not present

## 2019-10-19 DIAGNOSIS — R413 Other amnesia: Secondary | ICD-10-CM | POA: Diagnosis not present

## 2019-10-19 DIAGNOSIS — E785 Hyperlipidemia, unspecified: Secondary | ICD-10-CM | POA: Diagnosis not present

## 2019-10-19 DIAGNOSIS — E1140 Type 2 diabetes mellitus with diabetic neuropathy, unspecified: Secondary | ICD-10-CM | POA: Diagnosis not present

## 2019-10-19 DIAGNOSIS — I10 Essential (primary) hypertension: Secondary | ICD-10-CM | POA: Diagnosis not present

## 2019-10-19 DIAGNOSIS — E1169 Type 2 diabetes mellitus with other specified complication: Secondary | ICD-10-CM | POA: Diagnosis not present

## 2019-10-22 ENCOUNTER — Inpatient Hospital Stay: Payer: Medicare HMO | Attending: Radiation Oncology

## 2019-10-22 ENCOUNTER — Other Ambulatory Visit: Payer: Self-pay

## 2019-10-22 DIAGNOSIS — C61 Malignant neoplasm of prostate: Secondary | ICD-10-CM

## 2019-10-22 LAB — PSA: Prostatic Specific Antigen: <0.01 ng/mL (ref 0.00–4.00)

## 2019-10-26 DIAGNOSIS — I152 Hypertension secondary to endocrine disorders: Secondary | ICD-10-CM | POA: Diagnosis not present

## 2019-10-26 DIAGNOSIS — E1169 Type 2 diabetes mellitus with other specified complication: Secondary | ICD-10-CM | POA: Diagnosis not present

## 2019-10-26 DIAGNOSIS — E1159 Type 2 diabetes mellitus with other circulatory complications: Secondary | ICD-10-CM | POA: Diagnosis not present

## 2019-10-26 DIAGNOSIS — E1140 Type 2 diabetes mellitus with diabetic neuropathy, unspecified: Secondary | ICD-10-CM | POA: Diagnosis not present

## 2019-10-26 DIAGNOSIS — E785 Hyperlipidemia, unspecified: Secondary | ICD-10-CM | POA: Diagnosis not present

## 2019-10-27 ENCOUNTER — Other Ambulatory Visit (HOSPITAL_COMMUNITY): Payer: Self-pay | Admitting: Nurse Practitioner

## 2019-10-27 ENCOUNTER — Other Ambulatory Visit: Payer: Self-pay | Admitting: Nurse Practitioner

## 2019-10-27 ENCOUNTER — Other Ambulatory Visit: Payer: Self-pay

## 2019-10-27 DIAGNOSIS — I517 Cardiomegaly: Secondary | ICD-10-CM | POA: Diagnosis not present

## 2019-10-27 DIAGNOSIS — M546 Pain in thoracic spine: Secondary | ICD-10-CM | POA: Diagnosis not present

## 2019-10-27 DIAGNOSIS — G8929 Other chronic pain: Secondary | ICD-10-CM

## 2019-10-27 DIAGNOSIS — M4312 Spondylolisthesis, cervical region: Secondary | ICD-10-CM | POA: Diagnosis not present

## 2019-10-27 DIAGNOSIS — R2689 Other abnormalities of gait and mobility: Secondary | ICD-10-CM

## 2019-10-27 DIAGNOSIS — M545 Low back pain, unspecified: Secondary | ICD-10-CM | POA: Diagnosis not present

## 2019-10-27 DIAGNOSIS — M4322 Fusion of spine, cervical region: Secondary | ICD-10-CM | POA: Diagnosis not present

## 2019-10-27 DIAGNOSIS — Z981 Arthrodesis status: Secondary | ICD-10-CM | POA: Diagnosis not present

## 2019-10-29 ENCOUNTER — Ambulatory Visit: Payer: Medicare HMO | Admitting: Radiation Oncology

## 2019-11-05 ENCOUNTER — Ambulatory Visit
Admission: RE | Admit: 2019-11-05 | Discharge: 2019-11-05 | Disposition: A | Discharge status: Home / Self Care | Payer: Medicare HMO | Source: Ambulatory Visit | Attending: Radiation Oncology | Admitting: Radiation Oncology

## 2019-11-05 ENCOUNTER — Encounter: Payer: Self-pay | Admitting: Radiation Oncology

## 2019-11-05 ENCOUNTER — Other Ambulatory Visit: Payer: Self-pay

## 2019-11-05 VITALS — Temp 96.9°F | Wt 265.0 lb

## 2019-11-05 DIAGNOSIS — Z923 Personal history of irradiation: Secondary | ICD-10-CM | POA: Diagnosis not present

## 2019-11-05 DIAGNOSIS — C61 Malignant neoplasm of prostate: Secondary | ICD-10-CM | POA: Insufficient documentation

## 2019-11-05 NOTE — Progress Notes (Signed)
Radiation Oncology Follow up Note  Name: Taylor Austin   Date:   11/05/2019 MRN:  [id number] DOB: [birth year]    This 79 y.o. male presents to the clinic today for 2-year follow-up status post IMRT radiation therapy for Gleason 7 (4+3) adenocarcinoma the prostate stage IIa.  REFERRING PROVIDER: Juluis Pitch, MD  HPI: Patient is a 79 year old male now out 2 years having completed IMRT radiation therapy for Gleason 7 adenocarcinoma the prostate.  He is seen today in routine follow-up is doing well still has nocturia times about 4 which predates his radiation treatments he does take Flomax.  His PSA remains excellent at less than 0.01.Marland Kitchen  He is having no problems with diarrhea.  COMPLICATIONS OF TREATMENT: none  FOLLOW UP COMPLIANCE: keeps appointments   PHYSICAL EXAM:  Temp (!) 96.9 F (36.1 C) (Tympanic)   Wt 265 lb (120.2 kg)   BMI 44.10 kg/m  Well-developed well-nourished patient in NAD. HEENT reveals PERLA, EOMI, discs not visualized.  Oral cavity is clear. No oral mucosal lesions are identified. Neck is clear without evidence of cervical or supraclavicular adenopathy. Lungs are clear to A&P. Cardiac examination is essentially unremarkable with regular rate and rhythm without murmur rub or thrill. Abdomen is benign with no organomegaly or masses noted. Motor sensory and DTR levels are equal and symmetric in the upper and lower extremities. Cranial nerves II through XII are grossly intact. Proprioception is intact. No peripheral adenopathy or edema is identified. No motor or sensory levels are noted. Crude visual fields are within normal range.  RADIOLOGY RESULTS: No current films to review  PLAN: Present time patient is 2 years out from radiation treatment is has excellent biochemical control of his prostate cancer and pleased with his overall progress.  He will continue with finasteride as well as Flomax.  I have asked to see him back in 1 year with a PSA.  Patient and wife both  comprehend my recommendations well.  I would like to take this opportunity to thank you for allowing me to participate in the care of your patient.Noreene Filbert, MD

## 2019-11-13 ENCOUNTER — Ambulatory Visit
Admission: RE | Admit: 2019-11-13 | Discharge: 2019-11-13 | Disposition: A | Discharge status: Home / Self Care | Payer: Medicare HMO | Source: Ambulatory Visit | Attending: Nurse Practitioner | Admitting: Nurse Practitioner

## 2019-11-13 ENCOUNTER — Other Ambulatory Visit: Payer: Self-pay

## 2019-11-13 DIAGNOSIS — R2689 Other abnormalities of gait and mobility: Secondary | ICD-10-CM

## 2019-11-13 DIAGNOSIS — Z8546 Personal history of malignant neoplasm of prostate: Secondary | ICD-10-CM | POA: Diagnosis not present

## 2019-11-13 DIAGNOSIS — M50221 Other cervical disc displacement at C4-C5 level: Secondary | ICD-10-CM | POA: Diagnosis not present

## 2019-11-13 DIAGNOSIS — G8929 Other chronic pain: Secondary | ICD-10-CM

## 2019-11-13 DIAGNOSIS — Z981 Arthrodesis status: Secondary | ICD-10-CM | POA: Insufficient documentation

## 2019-11-13 DIAGNOSIS — M545 Low back pain, unspecified: Secondary | ICD-10-CM | POA: Diagnosis not present

## 2019-11-13 DIAGNOSIS — M5021 Other cervical disc displacement,  high cervical region: Secondary | ICD-10-CM | POA: Diagnosis not present

## 2019-11-13 DIAGNOSIS — M4802 Spinal stenosis, cervical region: Secondary | ICD-10-CM | POA: Diagnosis not present

## 2019-11-13 MED ORDER — GADOBUTROL 1 MMOL/ML IV SOLN
10.0000 mL | Freq: Once | INTRAVENOUS | Status: AC | PRN
  Administered 2019-11-13: 10 mL via INTRAVENOUS

## 2019-11-27 DIAGNOSIS — M5417 Radiculopathy, lumbosacral region: Secondary | ICD-10-CM | POA: Diagnosis not present

## 2019-11-27 DIAGNOSIS — G608 Other hereditary and idiopathic neuropathies: Secondary | ICD-10-CM | POA: Diagnosis not present

## 2019-11-27 DIAGNOSIS — F0150 Vascular dementia without behavioral disturbance: Secondary | ICD-10-CM | POA: Diagnosis not present

## 2019-11-27 DIAGNOSIS — G4733 Obstructive sleep apnea (adult) (pediatric): Secondary | ICD-10-CM | POA: Diagnosis not present

## 2019-11-27 DIAGNOSIS — G309 Alzheimer's disease, unspecified: Secondary | ICD-10-CM | POA: Diagnosis not present

## 2019-11-27 DIAGNOSIS — F0280 Dementia in other diseases classified elsewhere without behavioral disturbance: Secondary | ICD-10-CM | POA: Diagnosis not present

## 2019-12-10 DIAGNOSIS — Z20822 Contact with and (suspected) exposure to covid-19: Secondary | ICD-10-CM | POA: Diagnosis not present

## 2019-12-15 DIAGNOSIS — M5441 Lumbago with sciatica, right side: Secondary | ICD-10-CM | POA: Diagnosis not present

## 2019-12-15 DIAGNOSIS — G8929 Other chronic pain: Secondary | ICD-10-CM | POA: Diagnosis not present

## 2019-12-15 DIAGNOSIS — M48062 Spinal stenosis, lumbar region with neurogenic claudication: Secondary | ICD-10-CM | POA: Insufficient documentation

## 2019-12-15 DIAGNOSIS — M5442 Lumbago with sciatica, left side: Secondary | ICD-10-CM | POA: Diagnosis not present

## 2019-12-15 DIAGNOSIS — M545 Low back pain, unspecified: Secondary | ICD-10-CM | POA: Insufficient documentation

## 2019-12-24 DIAGNOSIS — E1140 Type 2 diabetes mellitus with diabetic neuropathy, unspecified: Secondary | ICD-10-CM | POA: Diagnosis not present

## 2019-12-25 DIAGNOSIS — Z20822 Contact with and (suspected) exposure to covid-19: Secondary | ICD-10-CM | POA: Diagnosis not present

## 2020-01-14 DIAGNOSIS — M5441 Lumbago with sciatica, right side: Secondary | ICD-10-CM | POA: Diagnosis not present

## 2020-01-14 DIAGNOSIS — E1140 Type 2 diabetes mellitus with diabetic neuropathy, unspecified: Secondary | ICD-10-CM | POA: Diagnosis not present

## 2020-01-14 DIAGNOSIS — M48062 Spinal stenosis, lumbar region with neurogenic claudication: Secondary | ICD-10-CM | POA: Diagnosis not present

## 2020-01-14 DIAGNOSIS — M5442 Lumbago with sciatica, left side: Secondary | ICD-10-CM | POA: Diagnosis not present

## 2020-01-27 DIAGNOSIS — Z20822 Contact with and (suspected) exposure to covid-19: Secondary | ICD-10-CM | POA: Diagnosis not present

## 2020-01-28 DIAGNOSIS — E785 Hyperlipidemia, unspecified: Secondary | ICD-10-CM | POA: Diagnosis not present

## 2020-01-28 DIAGNOSIS — E1140 Type 2 diabetes mellitus with diabetic neuropathy, unspecified: Secondary | ICD-10-CM | POA: Diagnosis not present

## 2020-01-28 DIAGNOSIS — E1169 Type 2 diabetes mellitus with other specified complication: Secondary | ICD-10-CM | POA: Diagnosis not present

## 2020-02-02 DIAGNOSIS — G8929 Other chronic pain: Secondary | ICD-10-CM | POA: Diagnosis not present

## 2020-02-02 DIAGNOSIS — M48062 Spinal stenosis, lumbar region with neurogenic claudication: Secondary | ICD-10-CM | POA: Diagnosis not present

## 2020-02-02 DIAGNOSIS — M5442 Lumbago with sciatica, left side: Secondary | ICD-10-CM | POA: Diagnosis not present

## 2020-02-02 DIAGNOSIS — M5441 Lumbago with sciatica, right side: Secondary | ICD-10-CM | POA: Diagnosis not present

## 2020-02-08 DIAGNOSIS — H9190 Unspecified hearing loss, unspecified ear: Secondary | ICD-10-CM | POA: Diagnosis not present

## 2020-02-08 DIAGNOSIS — E119 Type 2 diabetes mellitus without complications: Secondary | ICD-10-CM | POA: Diagnosis not present

## 2020-02-11 DIAGNOSIS — E1140 Type 2 diabetes mellitus with diabetic neuropathy, unspecified: Secondary | ICD-10-CM | POA: Diagnosis not present

## 2020-02-11 DIAGNOSIS — M48062 Spinal stenosis, lumbar region with neurogenic claudication: Secondary | ICD-10-CM | POA: Diagnosis not present

## 2020-02-11 DIAGNOSIS — M5441 Lumbago with sciatica, right side: Secondary | ICD-10-CM | POA: Diagnosis not present

## 2020-02-11 DIAGNOSIS — M5442 Lumbago with sciatica, left side: Secondary | ICD-10-CM | POA: Diagnosis not present

## 2020-02-24 DIAGNOSIS — M5442 Lumbago with sciatica, left side: Secondary | ICD-10-CM | POA: Diagnosis not present

## 2020-02-24 DIAGNOSIS — G8929 Other chronic pain: Secondary | ICD-10-CM | POA: Diagnosis not present

## 2020-02-24 DIAGNOSIS — M5441 Lumbago with sciatica, right side: Secondary | ICD-10-CM | POA: Diagnosis not present

## 2020-02-24 DIAGNOSIS — M48062 Spinal stenosis, lumbar region with neurogenic claudication: Secondary | ICD-10-CM | POA: Diagnosis not present

## 2020-03-03 DIAGNOSIS — M5442 Lumbago with sciatica, left side: Secondary | ICD-10-CM | POA: Diagnosis not present

## 2020-03-03 DIAGNOSIS — E1140 Type 2 diabetes mellitus with diabetic neuropathy, unspecified: Secondary | ICD-10-CM | POA: Diagnosis not present

## 2020-03-03 DIAGNOSIS — M48062 Spinal stenosis, lumbar region with neurogenic claudication: Secondary | ICD-10-CM | POA: Diagnosis not present

## 2020-03-15 DIAGNOSIS — M48061 Spinal stenosis, lumbar region without neurogenic claudication: Secondary | ICD-10-CM | POA: Diagnosis not present

## 2020-03-18 ENCOUNTER — Other Ambulatory Visit: Payer: Self-pay | Admitting: Urology

## 2020-03-23 DIAGNOSIS — E119 Type 2 diabetes mellitus without complications: Secondary | ICD-10-CM | POA: Diagnosis not present

## 2020-04-18 DIAGNOSIS — R053 Chronic cough: Secondary | ICD-10-CM | POA: Diagnosis not present

## 2020-04-18 DIAGNOSIS — R0600 Dyspnea, unspecified: Secondary | ICD-10-CM | POA: Diagnosis not present

## 2020-04-18 DIAGNOSIS — J439 Emphysema, unspecified: Secondary | ICD-10-CM | POA: Diagnosis not present

## 2020-04-18 DIAGNOSIS — J9811 Atelectasis: Secondary | ICD-10-CM | POA: Diagnosis not present

## 2020-04-18 DIAGNOSIS — J310 Chronic rhinitis: Secondary | ICD-10-CM | POA: Diagnosis not present

## 2020-04-18 DIAGNOSIS — I517 Cardiomegaly: Secondary | ICD-10-CM | POA: Diagnosis not present

## 2020-04-18 DIAGNOSIS — R059 Cough, unspecified: Secondary | ICD-10-CM | POA: Diagnosis not present

## 2020-04-18 DIAGNOSIS — G4733 Obstructive sleep apnea (adult) (pediatric): Secondary | ICD-10-CM | POA: Diagnosis not present

## 2020-04-18 DIAGNOSIS — Z8719 Personal history of other diseases of the digestive system: Secondary | ICD-10-CM | POA: Diagnosis not present

## 2020-04-19 DIAGNOSIS — N400 Enlarged prostate without lower urinary tract symptoms: Secondary | ICD-10-CM | POA: Diagnosis not present

## 2020-04-19 DIAGNOSIS — K219 Gastro-esophageal reflux disease without esophagitis: Secondary | ICD-10-CM | POA: Diagnosis not present

## 2020-04-19 DIAGNOSIS — Z6841 Body Mass Index (BMI) 40.0 and over, adult: Secondary | ICD-10-CM | POA: Diagnosis not present

## 2020-04-19 DIAGNOSIS — J449 Chronic obstructive pulmonary disease, unspecified: Secondary | ICD-10-CM | POA: Diagnosis not present

## 2020-04-19 DIAGNOSIS — E119 Type 2 diabetes mellitus without complications: Secondary | ICD-10-CM | POA: Diagnosis not present

## 2020-04-19 DIAGNOSIS — Z1331 Encounter for screening for depression: Secondary | ICD-10-CM | POA: Diagnosis not present

## 2020-04-19 DIAGNOSIS — I10 Essential (primary) hypertension: Secondary | ICD-10-CM | POA: Diagnosis not present

## 2020-04-19 DIAGNOSIS — E669 Obesity, unspecified: Secondary | ICD-10-CM | POA: Diagnosis not present

## 2020-04-19 DIAGNOSIS — Z0000 Encounter for general adult medical examination without abnormal findings: Secondary | ICD-10-CM | POA: Diagnosis not present

## 2020-04-19 DIAGNOSIS — F0390 Unspecified dementia without behavioral disturbance: Secondary | ICD-10-CM | POA: Diagnosis not present

## 2020-04-19 DIAGNOSIS — I2510 Atherosclerotic heart disease of native coronary artery without angina pectoris: Secondary | ICD-10-CM | POA: Diagnosis not present

## 2020-04-20 ENCOUNTER — Other Ambulatory Visit: Payer: Self-pay

## 2020-04-20 ENCOUNTER — Emergency Department
Admission: EM | Admit: 2020-04-20 | Discharge: 2020-04-21 | Disposition: A | Discharge status: Home / Self Care | Payer: Medicare HMO | Attending: Emergency Medicine | Admitting: Emergency Medicine

## 2020-04-20 ENCOUNTER — Encounter: Payer: Self-pay | Admitting: Emergency Medicine

## 2020-04-20 DIAGNOSIS — S6991XA Unspecified injury of right wrist, hand and finger(s), initial encounter: Secondary | ICD-10-CM | POA: Diagnosis present

## 2020-04-20 DIAGNOSIS — Z7984 Long term (current) use of oral hypoglycemic drugs: Secondary | ICD-10-CM | POA: Insufficient documentation

## 2020-04-20 DIAGNOSIS — Z7982 Long term (current) use of aspirin: Secondary | ICD-10-CM | POA: Insufficient documentation

## 2020-04-20 DIAGNOSIS — Z7902 Long term (current) use of antithrombotics/antiplatelets: Secondary | ICD-10-CM | POA: Diagnosis not present

## 2020-04-20 DIAGNOSIS — Z87891 Personal history of nicotine dependence: Secondary | ICD-10-CM | POA: Insufficient documentation

## 2020-04-20 DIAGNOSIS — E1169 Type 2 diabetes mellitus with other specified complication: Secondary | ICD-10-CM | POA: Diagnosis not present

## 2020-04-20 DIAGNOSIS — E785 Hyperlipidemia, unspecified: Secondary | ICD-10-CM | POA: Diagnosis not present

## 2020-04-20 DIAGNOSIS — Z955 Presence of coronary angioplasty implant and graft: Secondary | ICD-10-CM | POA: Insufficient documentation

## 2020-04-20 DIAGNOSIS — N189 Chronic kidney disease, unspecified: Secondary | ICD-10-CM | POA: Insufficient documentation

## 2020-04-20 DIAGNOSIS — W274XXA Contact with kitchen utensil, initial encounter: Secondary | ICD-10-CM | POA: Insufficient documentation

## 2020-04-20 DIAGNOSIS — E1122 Type 2 diabetes mellitus with diabetic chronic kidney disease: Secondary | ICD-10-CM | POA: Insufficient documentation

## 2020-04-20 DIAGNOSIS — I129 Hypertensive chronic kidney disease with stage 1 through stage 4 chronic kidney disease, or unspecified chronic kidney disease: Secondary | ICD-10-CM | POA: Diagnosis not present

## 2020-04-20 DIAGNOSIS — S61216A Laceration without foreign body of right little finger without damage to nail, initial encounter: Secondary | ICD-10-CM | POA: Diagnosis not present

## 2020-04-20 DIAGNOSIS — I2510 Atherosclerotic heart disease of native coronary artery without angina pectoris: Secondary | ICD-10-CM | POA: Diagnosis not present

## 2020-04-20 DIAGNOSIS — Z7951 Long term (current) use of inhaled steroids: Secondary | ICD-10-CM | POA: Diagnosis not present

## 2020-04-20 DIAGNOSIS — Z79899 Other long term (current) drug therapy: Secondary | ICD-10-CM | POA: Diagnosis not present

## 2020-04-20 NOTE — ED Triage Notes (Signed)
Patient ambulatory to triage with steady gait, without difficulty or distress noted; pt reports cutting tip of rt 5th finger this morning with a slicer; cont to have bleeding; currently taking Plavix

## 2020-04-21 DIAGNOSIS — S61216A Laceration without foreign body of right little finger without damage to nail, initial encounter: Secondary | ICD-10-CM | POA: Diagnosis not present

## 2020-04-21 NOTE — ED Provider Notes (Signed)
[facility] Emergency Department Provider Note   ____________________________________________   Event Date/Time   First MD Initiated Contact with Patient 04/20/20 2347     (approximate)  I have reviewed the triage vital signs and the nursing notes.   HISTORY  Chief Complaint Laceration    HPI Taylor Austin is a 80 y.o. male who presents to the ED from home with a chief complaint of wound bleeding.  Patient sliced a small piece of his right fifth finger pad around 9 AM while using a mandolin slicer.  Tetanus is up-to-date.  Patient takes Plavix and states finger has been bleeding all day despite multiple dressings and direct pressure.  Denies feeling faint, vision changes, chest pain, shortness of breath, abdominal pain, nausea, vomiting or dizziness.  Patient is vaccinated and boosted against COVID-19.     Past Medical History:  Diagnosis Date  . Allergy   . Anemia   . Arthritis   . Back pain   . Barrett esophagus   . BPH (benign prostatic hyperplasia)   . Chronic kidney disease   . COPD (chronic obstructive pulmonary disease) ([place])   . Coronary artery disease   . Depression   . Diabetes mellitus without complication ([place])   . Diverticulitis   . Dysphagia   . Esophageal reflux   . Esophageal reflux   . GERD (gastroesophageal reflux disease)   . Heart murmur   . Hyperlipidemia   . Hypersomnia   . Hypertension   . Kidney stones   . Low testosterone   . OAB (overactive bladder)   . Obesity   . Presence of dental bridge    2 - top  . Rheumatic fever   . Sleep apnea    BiPAP  . Stroke ([place]) 01/2016  . Vertigo   . Vitamin B12 deficiency     Patient Active Problem List   Diagnosis Date Noted  . Acute cerebrovascular accident (CVA) ([place]) 01/30/2016  . Chronic kidney disease 07/14/2014  . Clinical depression 07/14/2014  . Gait instability 05/05/2014  . Arthritis of knee 07/03/2013  . Arteriosclerosis of coronary artery 07/03/2013   . Diabetes mellitus, type 2 ([place]) 07/03/2013  . Benign essential HTN 07/03/2013  . HLD (hyperlipidemia) 07/03/2013  . Breath shortness 07/03/2013  . Apnea, sleep 06/18/2013    Past Surgical History:  Procedure Laterality Date  . BACK SURGERY    . CARDIAC CATHETERIZATION  08/11/2014   Procedure: Left Heart Cath and Coronary Angiography;  Surgeon: Teodoro Spray, MD;  Location: [facility];  Service: Cardiovascular;;  . CARDIAC CATHETERIZATION N/A 11/17/2014   Procedure: Right Heart Cath;  Surgeon: Teodoro Spray, MD;  Location: [facility];  Service: Cardiovascular;  Laterality: N/A;  . CAROTID STENT    . CATARACT EXTRACTION W/PHACO Left 04/03/2016   Procedure: CATARACT EXTRACTION PHACO AND INTRAOCULAR LENS PLACEMENT ([place]) left diabetic;  Surgeon: Eulogio Bear, MD;  Location: [facility];  Service: Ophthalmology;  Laterality: Left;  diabetic - oral meds sleep apnea  . CATARACT EXTRACTION W/PHACO Right 05/01/2016   Procedure: CATARACT EXTRACTION PHACO AND INTRAOCULAR LENS PLACEMENT ([place]) Right diabetic;  Surgeon: Eulogio Bear, MD;  Location: [facility];  Service: Ophthalmology;  Laterality: Right;  Diabetic oral meds sleep apnea  . CERVICAL SPINE SURGERY    . COLONOSCOPY    . COLONOSCOPY WITH PROPOFOL N/A 06/10/2015   Procedure: COLONOSCOPY WITH PROPOFOL;  Surgeon: Manya Silvas, MD;  Location: [facility] ENDOSCOPY;  Service: Endoscopy;  Laterality: N/A;  . CORONARY ANGIOPLASTY    . ESOPHAGOGASTRODUODENOSCOPY (EGD) WITH PROPOFOL N/A 06/28/2017   Procedure: ESOPHAGOGASTRODUODENOSCOPY (EGD) WITH PROPOFOL;  Surgeon: Manya Silvas, MD;  Location: [facility] ENDOSCOPY;  Service: Endoscopy;  Laterality: N/A;  . ESOPHAGOGASTRODUODENOSCOPY ENDOSCOPY    . EYE SURGERY    . LUMBAR SPINE SURGERY    . THORACIC SPINE SURGERY    . TONSILLECTOMY      Prior to Admission medications   Medication Sig Start Date End Date Taking? Authorizing Provider  ACCU-CHEK AVIVA  PLUS test strip USE STRIP TO CHECK GLUCOSE ONCE DAILY 06/19/17   [provider]  albuterol (PROVENTIL HFA;VENTOLIN HFA) 108 (90 Base) MCG/ACT inhaler Inhale into the lungs every 6 (six) hours as needed for wheezing or shortness of breath.    [provider]  aspirin EC 81 MG tablet Take 81 mg by mouth daily.    [provider]  atorvastatin (LIPITOR) 40 MG tablet TAKE 1 TABLET BY MOUTH ONCE DAILY 04/29/17   [provider]  clopidogrel (PLAVIX) 75 MG tablet Take 1 tablet (75 mg total) by mouth daily. 02/01/16   Hillary Bow, MD  Cyanocobalamin (VITAMIN B-12 PO) Take by mouth.    [provider]  donepezil (ARICEPT) 10 MG tablet Take by mouth. 04/15/17 07/14/17  [provider]  DULoxetine (CYMBALTA) 60 MG capsule  08/14/18   [provider]  finasteride (PROSCAR) 5 MG tablet TAKE 1 TABLET EVERY DAY 03/18/20   Hollice Espy, MD  gabapentin (NEURONTIN) 100 MG capsule Take 100 mg by mouth 3 (three) times daily.    [provider]  glimepiride (AMARYL) 2 MG tablet Take 2 mg by mouth daily with breakfast.    [provider]  glyBURIDE-metformin (GLUCOVANCE) 5-500 MG per tablet TAKE ONE TABLET BY MOUTH TWICE DAILY WITH MEALS 04/01/14   [provider]  losartan (COZAAR) 100 MG tablet Take 100 mg by mouth daily.    [provider]  magnesium oxide (MAG-OX) 400 MG tablet Take 400 mg by mouth 2 (two) times daily.     [provider]  Multiple Vitamin (MULTI-VITAMINS) TABS Take 1 tablet by mouth daily.     [provider]  oxymetazoline (AFRIN) 0.05 % nasal spray Place 1 spray into both nostrils 2 (two) times daily.    [provider]  pantoprazole (PROTONIX) 40 MG tablet Take by mouth. 04/10/17   [provider]  pioglitazone (ACTOS) 45 MG tablet Take 45 mg by mouth daily.  04/16/14   [provider]  Probiotic Product (PROBIOTIC PO) Take by mouth.    [provider]   simvastatin (ZOCOR) 40 MG tablet Take 40 mg by mouth daily at 6 PM.  07/02/14   [provider]  tamsulosin (FLOMAX) 0.4 MG CAPS capsule Take 1 capsule (0.4 mg total) by mouth daily after supper. 01/30/18   Noreene Filbert, MD  umeclidinium-vilanterol (ANORO ELLIPTA) 62.5-25 MCG/INH AEPB Inhale 1 puff into the lungs daily.    [provider]    Allergies Hydrocodone and Oxycodone-acetaminophen  Family History  Problem Relation Age of Onset  . Stroke Father   . Anesthesia problems Father   . Anesthesia problems Mother   . Prostate cancer Brother   . Kidney disease Neg Hx   . Bladder Cancer Neg Hx     Social History Social History   Tobacco Use  . Smoking status: Former Smoker    Types: Cigarettes    Quit date:  07/13/1973    Years since quitting: 46.8  . Smokeless tobacco: Never Used  . Tobacco comment: reports he smoked 4 packs a day for 10 years and quit in 1975  Vaping Use  . Vaping Use: Never used  Substance Use Topics  . Alcohol use: Yes    Alcohol/week: 1.0 standard drink    Types: 1 Standard drinks or equivalent per week    Comment: rarely drinks, for holidays  . Drug use: No    Review of Systems  Constitutional: No fever/chills Eyes: No visual changes. ENT: No sore throat. Cardiovascular: Denies chest pain. Respiratory: Denies shortness of breath. Gastrointestinal: No abdominal pain.  No nausea, no vomiting.  No diarrhea.  No constipation. Genitourinary: Negative for dysuria. Musculoskeletal: Positive for right fifth digit bleeding.  Negative for back pain. Skin: Negative for rash. Neurological: Negative for headaches, focal weakness or numbness.   ____________________________________________   PHYSICAL EXAM:  VITAL SIGNS: ED Triage Vitals  Enc Vitals Group     BP 04/20/20 2336 137/90     Pulse Rate 04/20/20 2336 88     Resp 04/20/20 2336 18     Temp 04/20/20 2336 99.4 F (37.4 C)     Temp Source 04/20/20 2336 Oral     SpO2  04/20/20 2336 99 %     Weight 04/20/20 2337 260 lb (117.9 kg)     Height 04/20/20 2337 5\' 5"  (1.651 m)     Head Circumference --      Peak Flow --      Pain Score 04/20/20 2336 0     Pain Loc --      Pain Edu? --      Excl. in [place]? --     Constitutional: Alert and oriented. Well appearing and in no acute distress. Eyes: Conjunctivae are normal. PERRL. EOMI. Head: Atraumatic. Nose: No congestion/rhinnorhea. Mouth/Throat: Mucous membranes are moist.   Neck: No stridor.   Cardiovascular: Normal rate, regular rhythm. Grossly normal heart sounds.  Good peripheral circulation. Respiratory: Normal respiratory effort.  No retractions. Lungs CTAB. Gastrointestinal: Soft and nontender. No distention. No abdominal bruits. No CVA tenderness. Musculoskeletal:  Right fifth digit: Small avulsion type laceration to medial finger pad with mild oozing of blood.  Full range of motion without pain.  2+ radial pulse.  Brisk, less than 5-second cap refill. No lower extremity tenderness nor edema.  No joint effusions. Neurologic:  Normal speech and language. No gross focal neurologic deficits are appreciated. No gait instability. Skin:  Skin is warm, dry and intact. No rash noted. Psychiatric: Mood and affect are normal. Speech and behavior are normal.  ____________________________________________   LABS (all labs ordered are listed, but only abnormal results are displayed)  Labs Reviewed - No data to display ____________________________________________  EKG  None ____________________________________________  RADIOLOGY I, Jourdin Connors J, personally viewed and evaluated these images (plain radiographs) as part of my medical decision making, as well as reviewing the written report by the radiologist.  ED MD interpretation: None  Official radiology report(s): No results found.  ____________________________________________   PROCEDURES  Procedure(s) performed (including Critical  Care):  Procedures   ____________________________________________   INITIAL IMPRESSION / ASSESSMENT AND PLAN / ED COURSE  As part of my medical decision making, I reviewed the following data within the [place] notes reviewed and incorporated and Notes from prior ED visits     80 year old male presenting with continued oozing from finger status post laceration around 9 AM, on Plavix.  Denies lightheadedness or dizziness.  Will clean wound and try Surgicel with direct pressure first.  Clinical Course as of 04/21/20 0300  Thu Apr 21, 2020  0006 Direct pressure, Surgicel with tight Coban dressing applied.  Instructed patient to elevate arm.  Will recheck in 20 minutes [JS]  0043 Coban unwrapped to reexamine finger: No bleeding through Surgicel.  Pristine Surgicel dressing without evidence of bleeding through; this was shown to the patient as well as his wife.  Finger rewrapped with Coban.  Will add splint as patient continues to wave his finger around.  Instructed patient and wife to hold Plavix x3 days, keep the dressing and splint on until tomorrow evening when he should soak the Surgicel off the finger.  Strict return precautions given.  Both verbalized understanding and agree with plan of care. [JS]    Clinical Course User Index [JS] Paulette Blanch, MD     ____________________________________________   FINAL CLINICAL IMPRESSION(S) / ED DIAGNOSES  Final diagnoses:  Laceration of right little finger without foreign body without damage to nail, initial encounter     ED Discharge Orders    None      *Please note:  Taylor Austin was evaluated in Emergency Department on 04/21/2020 for the symptoms described in the history of present illness. He was evaluated in the context of the global COVID-19 pandemic, which necessitated consideration that the patient might be at risk for infection with the SARS-CoV-2 virus that causes COVID-19. Institutional protocols  and algorithms that pertain to the evaluation of patients at risk for COVID-19 are in a state of rapid change based on information released by regulatory bodies including the CDC and federal and state organizations. These policies and algorithms were followed during the patient's care in the ED.  Some ED evaluations and interventions may be delayed as a result of limited staffing during and the pandemic.*   Note:  This document was prepared using Dragon voice recognition software and may include unintentional dictation errors.   Paulette Blanch, MD 04/21/20 0300

## 2020-04-21 NOTE — ED Notes (Signed)
Splint applied to right pinky finger

## 2020-04-21 NOTE — Discharge Instructions (Addendum)
Keep dressing and splint on until tomorrow evening.  Remove outer bandage and soak your finger to remove medicated gauze.  After that you may apply a thin layer of Neosporin over the wound or you may leave it open to the air.  Hold your Plavix for the next 3 days, then resume on the fourth day.  Return to the ER for worsening symptoms, persistent vomiting, feeling lightheaded or other concerns.

## 2020-04-21 NOTE — ED Notes (Signed)
Dr Beather Arbour at bedside. Cut cleansed. Surgicel applied and wrapped tightly. Vitals updated and WNL. Will re check shortly and continue to monitor patient.

## 2020-04-26 ENCOUNTER — Ambulatory Visit: Payer: Medicare HMO | Admitting: Urology

## 2020-04-26 ENCOUNTER — Other Ambulatory Visit: Payer: Self-pay

## 2020-04-26 VITALS — BP 156/89 | HR 82 | Wt 260.0 lb

## 2020-04-26 DIAGNOSIS — N5203 Combined arterial insufficiency and corporo-venous occlusive erectile dysfunction: Secondary | ICD-10-CM

## 2020-04-26 DIAGNOSIS — M6289 Other specified disorders of muscle: Secondary | ICD-10-CM

## 2020-04-26 DIAGNOSIS — C61 Malignant neoplasm of prostate: Secondary | ICD-10-CM

## 2020-04-26 LAB — BLADDER SCAN AMB NON-IMAGING: Scan Result: 0

## 2020-04-26 MED ORDER — SILDENAFIL CITRATE 20 MG PO TABS: ORAL_TABLET | ORAL | 3 refills | Status: AC

## 2020-04-26 NOTE — Progress Notes (Signed)
04/26/2020 6:43 PM   [name] September 12, 1940 [id number]  Referring provider: Juluis Pitch, MD [phone] [place],  [place] [address]  Chief Complaint  Patient presents with  . Prostate Cancer    HPI: 80 year old male with personal history of prostate cancer, voiding dysfunction/OAB returns today for routine annual follow-up.  He is accompanied today by his wife.  Prostate Cancer Intermediate Gleason 4+3 prostate cancers/pIMRT in 12/2017.   He underwent prostate biopsy on 08/13/2017 due to rising PSA despite being on finasteride. His PSA at the time was 3.3.  Prostate biopsy revealed only 2 cores positive of disease, 2% of the left base described as 4+3 prostate cancer as well as Gleason 3+3 at the left mid involving only 2%. There is also a suspicious lesion in the left apex but this was nondiagnostic.  Completed 6 month Lupron depot on 02/18/2018.   Last PSA 6 months ago undetectable.    Dysfunctional Voiding He underwent cystoscopy 01/2017 with 3 to 4 cm which was somewhat obstructive without a significant median lobe or bladder pathology. TRUS volume 29 cc.  UDS12/2019indicated that he may also have dysfunctional voiding and OAB. Previously tried multiple medications including anticholinergics, finasteride, Myrbetriq, PTNS without any improvement.  He remains on finasteride/Flomax.  Remains severe as outlined below.  OAB Refractory overactivity symptoms, little bit from previous interventions (Myrbetriq, anticholinergics and PTNS). See above.  Erectile dysfunction New problem, never previously discussed.  Reports decreased ability to maintain and achieve erections.  This problem is worsening.  He he tried sildenafil greater than 10 years ago without significant results.      IPSS    Row Name 04/26/20 1500         International Prostate Symptom Score   How often have you had the sensation of not emptying your bladder? About half the time      How often have you had to urinate less than every two hours? Almost always     How often have you found you stopped and started again several times when you urinated? More than half the time     How often have you found it difficult to postpone urination? Almost always     How often have you had a weak urinary stream? Almost always     How often have you had to strain to start urination? Less than half the time     How many times did you typically get up at night to urinate? 5 Times     Total IPSS Score 29           Quality of Life due to urinary symptoms   If you were to spend the rest of your life with your urinary condition just the way it is now how would you feel about that? Unhappy            Score:  1-7 Mild 8-19 Moderate 20-35 Severe   PMH: Past Medical History:  Diagnosis Date  . Allergy   . Anemia   . Arthritis   . Back pain   . Barrett esophagus   . BPH (benign prostatic hyperplasia)   . Chronic kidney disease   . COPD (chronic obstructive pulmonary disease) ([place])   . Coronary artery disease   . Depression   . Diabetes mellitus without complication ([place])   . Diverticulitis   . Dysphagia   . Esophageal reflux   . Esophageal reflux   . GERD (gastroesophageal reflux disease)   . Heart  murmur   . Hyperlipidemia   . Hypersomnia   . Hypertension   . Kidney stones   . Low testosterone   . OAB (overactive bladder)   . Obesity   . Presence of dental bridge    2 - top  . Rheumatic fever   . Sleep apnea    BiPAP  . Stroke ([place]) 01/2016  . Vertigo   . Vitamin B12 deficiency     Surgical History: Past Surgical History:  Procedure Laterality Date  . BACK SURGERY    . CARDIAC CATHETERIZATION  08/11/2014   Procedure: Left Heart Cath and Coronary Angiography;  Surgeon: Teodoro Spray, MD;  Location: [facility];  Service: Cardiovascular;;  . CARDIAC CATHETERIZATION N/A 11/17/2014   Procedure: Right Heart Cath;  Surgeon: Teodoro Spray, MD;   Location: [facility];  Service: Cardiovascular;  Laterality: N/A;  . CAROTID STENT    . CATARACT EXTRACTION W/PHACO Left 04/03/2016   Procedure: CATARACT EXTRACTION PHACO AND INTRAOCULAR LENS PLACEMENT ([place]) left diabetic;  Surgeon: Eulogio Bear, MD;  Location: [facility];  Service: Ophthalmology;  Laterality: Left;  diabetic - oral meds sleep apnea  . CATARACT EXTRACTION W/PHACO Right 05/01/2016   Procedure: CATARACT EXTRACTION PHACO AND INTRAOCULAR LENS PLACEMENT ([place]) Right diabetic;  Surgeon: Eulogio Bear, MD;  Location: [facility];  Service: Ophthalmology;  Laterality: Right;  Diabetic oral meds sleep apnea  . CERVICAL SPINE SURGERY    . COLONOSCOPY    . COLONOSCOPY WITH PROPOFOL N/A 06/10/2015   Procedure: COLONOSCOPY WITH PROPOFOL;  Surgeon: Manya Silvas, MD;  Location[facility];  Service: Endoscopy;  Laterality: N/A;  . CORONARY ANGIOPLASTY    . ESOPHAGOGASTRODUODENOSCOPY (EGD) WITH PROPOFOL N/A 06/28/2017   Procedure: ESOPHAGOGASTRODUODENOSCOPY (EGD) WITH PROPOFOL;  Surgeon: Manya Silvas, MD;  Location[facility] ENDOSCOPY;  Service: Endoscopy;  Laterality: N/A;  . ESOPHAGOGASTRODUODENOSCOPY ENDOSCOPY    . EYE SURGERY    . LUMBAR SPINE SURGERY    . THORACIC SPINE SURGERY    . TONSILLECTOMY      Home Medications:  Allergies as of 04/26/2020      Reactions   Hydrocodone Shortness Of Breath   Oxycodone-acetaminophen Shortness Of Breath      Medication List       Accurate as of April 26, 2020  6:43 PM. If you have any questions, ask your nurse or doctor.        Accu-Chek Aviva Plus test strip Generic drug: glucose blood USE STRIP TO CHECK GLUCOSE ONCE DAILY   albuterol 108 (90 Base) MCG/ACT inhaler Commonly known as: VENTOLIN HFA Inhale into the lungs every 6 (six) hours as needed for wheezing or shortness of breath.   aspirin EC 81 MG tablet Take 81 mg by mouth daily.   atorvastatin 40 MG tablet Commonly known as:  LIPITOR TAKE 1 TABLET BY MOUTH ONCE DAILY   clopidogrel 75 MG tablet Commonly known as: PLAVIX Take 1 tablet (75 mg total) by mouth daily.   donepezil 10 MG tablet Commonly known as: ARICEPT Take by mouth.   DULoxetine 60 MG capsule Commonly known as: CYMBALTA   finasteride 5 MG tablet Commonly known as: PROSCAR TAKE 1 TABLET EVERY DAY   gabapentin 100 MG capsule Commonly known as: NEURONTIN Take 100 mg by mouth 3 (three) times daily.   glimepiride 4 MG tablet Commonly known as: AMARYL Take by mouth. What changed: Another medication with the same name was removed. Continue taking this  medication, and follow the directions you see here. Changed by: Hollice Espy, MD   glyBURIDE-metformin 5-500 MG tablet Commonly known as: GLUCOVANCE TAKE ONE TABLET BY MOUTH TWICE DAILY WITH MEALS   Invokana 100 MG Tabs tablet Generic drug: canagliflozin Take 100 mg by mouth daily.   losartan 100 MG tablet Commonly known as: COZAAR Take 100 mg by mouth daily.   magnesium oxide 400 MG tablet Commonly known as: MAG-OX Take 400 mg by mouth 2 (two) times daily.   metFORMIN 500 MG 24 hr tablet Commonly known as: GLUCOPHAGE-XR Take 2 tablets by mouth 2 (two) times daily.   Multi-Vitamins Tabs Take 1 tablet by mouth daily.   oxymetazoline 0.05 % nasal spray Commonly known as: AFRIN Place 1 spray into both nostrils 2 (two) times daily.   pantoprazole 40 MG tablet Commonly known as: PROTONIX Take by mouth.   pioglitazone 15 MG tablet Commonly known as: ACTOS TAKE 3 TABLETS ONE TIME DAILY What changed: Another medication with the same name was removed. Continue taking this medication, and follow the directions you see here. Changed by: Hollice Espy, MD   PROBIOTIC PO Take by mouth.   sildenafil 20 MG tablet Commonly known as: REVATIO 1-5 tablets as needed one hour prior to intercourse Started by: Hollice Espy, MD   simvastatin 40 MG tablet Commonly known as:  ZOCOR Take 40 mg by mouth daily at 6 PM.   tamsulosin 0.4 MG Caps capsule Commonly known as: FLOMAX Take 1 capsule (0.4 mg total) by mouth daily after supper.   umeclidinium-vilanterol 62.5-25 MCG/INH Aepb Commonly known as: ANORO ELLIPTA Inhale 1 puff into the lungs daily.   VITAMIN B-12 PO Take by mouth.       Allergies:  Allergies  Allergen Reactions  . Hydrocodone Shortness Of Breath  . Oxycodone-Acetaminophen Shortness Of Breath    Family History: Family History  Problem Relation Age of Onset  . Stroke Father   . Anesthesia problems Father   . Anesthesia problems Mother   . Prostate cancer Brother   . Kidney disease Neg Hx   . Bladder Cancer Neg Hx     Social History:  reports that he quit smoking about 46 years ago. His smoking use included cigarettes. He has never used smokeless tobacco. He reports current alcohol use of about 1.0 standard drink of alcohol per week. He reports that he does not use drugs.   Physical Exam: BP (!) 156/89   Pulse 82   Wt 260 lb (117.9 kg)   BMI 43.27 kg/m   Constitutional:  Alert and oriented, No acute distress.  Accompanied by his wife today. HEENT: [place] AT, moist mucus membranes.  Trachea midline, no masses. Cardiovascular: No clubbing, cyanosis, or edema. Respiratory: Normal respiratory effort, no increased work of breathing. Skin: No rashes, bruises or suspicious lesions. Neurologic: Grossly intact, no focal deficits, moving all 4 extremities. Psychiatric: Normal mood and affect.  Results for orders placed or performed in visit on 04/26/20  BLADDER SCAN AMB NON-IMAGING  Result Value Ref Range   Scan Result 0 ml     Assessment & Plan:    1. Prostate cancer ([place]) Status post ADT x6 months with XRT  Most recent PSA 6 months ago undetectable  Repeat PSA today, will continue to monitor every 6 months - PSA; Future - BLADDER SCAN AMB NON-IMAGING - PSA  2. Pelvic floor dysfunction Severe urinary symptoms on maximal  medical therapy with finasteride and Flomax, previously failed all anticholinergics  Urodynamics consistent  with pelvic floor dysfunction  Previously declined pelvic floor physical therapy, now currently open to this idea  Plan for PT referral - Ambulatory referral to Physical Therapy  3. Combined arterial insufficiency and corporo-venous occlusive erectile dysfunction Mild chronic issue with recent exacerbation, multiple risk factors  Discussed repeat trial of sildenafil which he is not tried for quite some time, titrate dose of 200 mg.  TRUS discussed possible side effects.  No current contraindications.  He was provided with a coupon and good Rx.  Wife also mentions today that he has been taking some online supplements, would prefer to avoid especially given his history of prostate cancer.   Follow-up in 6 months with PSA/IPSS/PVR  Hollice Espy, MD  [facility] [address] [place], [place] [address] [phone]

## 2020-04-27 DIAGNOSIS — E1169 Type 2 diabetes mellitus with other specified complication: Secondary | ICD-10-CM | POA: Diagnosis not present

## 2020-04-27 DIAGNOSIS — E1140 Type 2 diabetes mellitus with diabetic neuropathy, unspecified: Secondary | ICD-10-CM | POA: Diagnosis not present

## 2020-04-27 DIAGNOSIS — E785 Hyperlipidemia, unspecified: Secondary | ICD-10-CM | POA: Diagnosis not present

## 2020-04-27 LAB — PSA: Prostate Specific Ag, Serum: <0.1 ng/mL (ref 0.0–4.0)

## 2020-05-02 ENCOUNTER — Ambulatory Visit: Payer: Medicare HMO | Admitting: Pain Medicine

## 2020-05-13 DIAGNOSIS — I152 Hypertension secondary to endocrine disorders: Secondary | ICD-10-CM | POA: Diagnosis not present

## 2020-05-13 DIAGNOSIS — E1159 Type 2 diabetes mellitus with other circulatory complications: Secondary | ICD-10-CM | POA: Diagnosis not present

## 2020-05-13 DIAGNOSIS — E1169 Type 2 diabetes mellitus with other specified complication: Secondary | ICD-10-CM | POA: Diagnosis not present

## 2020-05-13 DIAGNOSIS — E785 Hyperlipidemia, unspecified: Secondary | ICD-10-CM | POA: Diagnosis not present

## 2020-05-13 DIAGNOSIS — E1140 Type 2 diabetes mellitus with diabetic neuropathy, unspecified: Secondary | ICD-10-CM | POA: Diagnosis not present

## 2020-06-01 DIAGNOSIS — G309 Alzheimer's disease, unspecified: Secondary | ICD-10-CM | POA: Diagnosis not present

## 2020-06-01 DIAGNOSIS — G608 Other hereditary and idiopathic neuropathies: Secondary | ICD-10-CM | POA: Diagnosis not present

## 2020-06-01 DIAGNOSIS — F0150 Vascular dementia without behavioral disturbance: Secondary | ICD-10-CM | POA: Diagnosis not present

## 2020-06-01 DIAGNOSIS — G4733 Obstructive sleep apnea (adult) (pediatric): Secondary | ICD-10-CM | POA: Diagnosis not present

## 2020-06-01 DIAGNOSIS — F0280 Dementia in other diseases classified elsewhere without behavioral disturbance: Secondary | ICD-10-CM | POA: Diagnosis not present

## 2020-06-14 NOTE — Progress Notes (Signed)
Patient: Taylor Austin  Service Category: E/M  Provider: Gaspar Cola, MD  DOB: [birth year]  DOS: 06/15/2020  Referring Provider: Deetta Perla, MD  MRN: [id number]  Setting: Ambulatory outpatient  PCP: Juluis Pitch, MD  Type: New Patient  Specialty: Interventional Pain Management    Location: Office  Delivery: Face-to-face     Primary Reason(s) for Visit: Encounter for initial evaluation of one or more chronic problems (new to examiner) potentially causing chronic pain, and posing a threat to normal musculoskeletal function. (Level of risk: High) CC: Back Pain  HPI  Taylor Austin is a 80 y.o. year old, male patient, who comes for the first time to our practice referred by Deetta Perla, MD for our initial evaluation of his chronic pain. He has Arthritis of knee; Chronic kidney disease; Arteriosclerosis of coronary artery; Clinical depression; Type 2 diabetes mellitus with diabetic neuropathy, without long-term current use of insulin ([place]); Benign essential HTN; Gait instability; HLD (hyperlipidemia); Breath shortness; Sleep apnea; Acute cerebrovascular accident (CVA) ([place]); Morbid obesity with BMI of 40.0-44.9, adult ([place]); Chronic low back pain (1ry area of Pain) (Bilateral) (R>L) w/o sciatica; Chronic obstructive pulmonary disease ([place]); Gastroesophageal reflux disease without esophagitis; History of Barrett's esophagus; Hyperlipidemia associated with type 2 diabetes mellitus ([place]); Lumbar stenosis with neurogenic claudication; Memory loss, short term; Other dysphagia; Atherosclerotic heart disease of native coronary artery without angina pectoris; Chronic pain syndrome; Pharmacologic therapy; Disorder of skeletal system; Problems influencing health status; Abnormal MRI, cervical spine (11/14/2019); Abnormal MRI, lumbar spine (11/14/2019); History of lumbar laminectomy; History of fusion of cervical spine; DDD (degenerative disc disease), cervical; DDD (degenerative disc disease), lumbosacral;  Lumbosacral facet arthropathy; Cervical facet hypertrophy; DJD (degenerative joint disease) of cervical spine; Chronic anticoagulation (Plavix); Lumbar facet joint pain (Bilateral); History of CVA (cerebrovascular accident); and Poor historian on their problem list. Today he comes in for evaluation of his Back Pain  Pain Assessment: Location: Lower Back Radiating: pain radiaties down to both buttock, hips , and legs to his foot. both foot is numb right is worse Onset: More than a month ago Duration: Chronic pain Quality: Numbness,Tingling,Sharp,Aching,Shooting Severity: 5 /10 (subjective, self-reported pain score)  Effect on ADL: limits my daily activities Timing: Constant Modifying factors: sit down, aspercreme,icy hot, theraworx, biofreeze, meds BP: (!) 130/98  HR: 100  Onset and Duration: Gradual and Present longer than 3 months Cause of pain: Unknown Severity: Getting worse, NAS-11 at its worse: 10/10 and NAS-11 at its best: 6/10 Timing: During activity or exercise and After activity or exercise Aggravating Factors: Climbing, Kneeling, Lifiting, Motion, Nerve blocks, Prolonged standing, Squatting, Stooping , Twisting, Walking, Walking uphill, Walking downhill and Working Alleviating Factors: Lying down, Sitting and Sleeping Associated Problems: Night-time cramps, Dizziness, Erectile dysfunction, Fatigue, Impotence, Inability to concentrate, Inability to control bladder (urine), Numbness, Tingling, Weakness, Pain that wakes patient up and Pain that does not allow patient to sleep Quality of Pain: Cramping, Disabling, Exhausting, Getting shorter, Sharp, Shooting, Stabbing, Throbbing, Tiring and Uncomfortable Previous Examinations or Tests: Biopsy, CT scan, Endoscopy, MRI scan, X-rays, Nerve conduction test and Neurological evaluation Previous Treatments: Epidural steroid injections, Physical Therapy and TENS  The patient is a poor historian.  Thankfully his wife was present and very  helpful.  He also seems to have great difficulty allowing for a 2 way communication take place.  For this reason, extracting information from this patient was very difficult and took a lot longer than it should have.  According to the patient the primary area of pain  inside of the lower back (Bilateral) (R>L) he indicates having had 1 prior lumbar spine surgery that took place around September 2006 at the [facility] in [place].  His surgeon appears to have been a Dr.Scheid (they did not know if the surgeon was a neurosurgeon or an Doctor, general practice).  They described the pain as being referred to both buttocks areas and having a "shooting" character to it (neuropathic).  According to them he had physical therapy but it did not help.  Apparently he started it twice but it was not completed.  The patient has a relatively recent MRI.  Interventional therapies: (by Girtha Hake, MD-PMR)  1.  Left L4 TFESI x1 (03/03/2020) 2.  Left L5 TFESI x2 (01/14/2020; 02/11/2020)  The patient secondary area of pain is that of the lower extremities (Bilateral) (R>L).  This is the part of the encounter that was very confusing since he admitted to having right lower extremity pain going all the way down to the top of the foot and what seems to be an L5 dermatomal distribution however, on the left side he started going back and forth and eventually lost his patience and indicated that he did not have any pain on the left lower extremity.  He admitted to having bilateral cramps in his calf that appears to victim primarily at bedtime.  He also describes being seen bilateral lower extremity muscle wasting and numbness, which again I cannot extract from him the location of this numbness.  Every time that I asked him a specific question he would go back to saying that he is primary pain was in the lower back and buttocks.  I asked him whether or not he had any pain in the hips and the knees, but he did not provide  me with information on this.  Physical exam: The patient had negative bilateral straight leg raise.  Positive pain in the lower lumbar region with Lumbar spine hyperextension.  Hyperextension and rotation maneuver as well as Lynelle Smoke maneuver was positive for bilateral lumbar facet arthropathy.  Provocative Patrick maneuver was negative bilaterally for hip or sacroiliac joint pain.  The patient also has multiple medical problems including having had a stroke around January 2008 and currently being on Plavix anticoagulation.  He is morbidly obese, COPD, with chronic kidney disease, gastroesophageal reflux disease, type 2 diabetes, hypertension, coronary artery disease, hyperlipidemia, shortness of breath, sleep apnea, and morbid obesity with a BMI of 43.27 kg/m.  Today I took the time to provide the patient with information regarding my pain practice. The patient was informed that my practice is divided into two sections: an interventional pain management section, as well as a completely separate and distinct medication management section. I explained that I have procedure days for my interventional therapies, and evaluation days for follow-ups and medication management. Because of the amount of documentation required during both, they are kept separated. This means that there is the possibility that he may be scheduled for a procedure on one day, and medication management the next. I have also informed him that because of staffing and facility limitations, I no longer take patients for medication management only. To illustrate the reasons for this, I gave the patient the example of surgeons, and how inappropriate it would be to refer a patient to his/her care, just to write for the post-surgical antibiotics on a surgery done by a different surgeon.   Because interventional pain management is my board-certified specialty, the patient was informed that  joining my practice means that they are open to any and all  interventional therapies. I made it clear that this does not mean that they will be forced to have any procedures done. What this means is that I believe interventional therapies to be essential part of the diagnosis and proper management of chronic pain conditions. Therefore, patients not interested in these interventional alternatives will be better served under the care of a different practitioner.  The patient was also made aware of my Comprehensive Pain Management Safety Guidelines where by joining my practice, they limit all of their nerve blocks and joint injections to those done by our practice, for as long as we are retained to manage their care.   He indicated not being interested in any type of controlled substances.  Historic Controlled Substance Pharmacotherapy Review  PMP and historical list of controlled substances: None Current opioid analgesics:  None MME/day: 0 mg/day  Historical Monitoring: The patient  reports no history of drug use. List of all UDS Test(s): No results found for: MDMA, COCAINSCRNUR, [place], , CANNABQUANT, THCU, [place] List of other Serum/Urine Drug Screening Test(s):  No results found for: AMPHSCRSER, BARBSCRSER, BENZOSCRSER, COCAINSCRSER, COCAINSCRNUR, PCPSCRSER, PCPQUANT, THCSCRSER, THCU, CANNABQUANT, OPIATESCRSER, OXYSCRSER, PROPOXSCRSER, ETH Historical Background Evaluation: [place] PMP: PDMP reviewed during this encounter. Online review of the past 47-month period conducted.             PMP NARX Score Report:  Narcotic: 000 Sedative: 000 Stimulant: 000 [place] Department of public safety, offender search: Editor, commissioning Information) Non-contributory Risk Assessment Profile: Aberrant behavior: None observed or detected today Risk factors for fatal opioid overdose: None identified today PMP NARX Overdose Risk Score: 000 Fatal overdose hazard ratio (HR): Calculation deferred Non-fatal overdose hazard ratio (HR): Calculation deferred Risk of opioid abuse or  dependence: 0.7-3.0% with doses ? 36 MME/day and 6.1-26% with doses ? 120 MME/day. Substance use disorder (SUD) risk level: See below Personal History of Substance Abuse (SUD-Substance use disorder):  Alcohol: Negative  Illegal Drugs: Negative  Rx Drugs: Negative  ORT Risk Level calculation: Low Risk  Opioid Risk Tool - 06/15/20 1026      Family History of Substance Abuse   Alcohol Positive Male    Illegal Drugs Negative    Rx Drugs Negative      Personal History of Substance Abuse   Alcohol Negative    Illegal Drugs Negative    Rx Drugs Negative      Age   Age between 43-45 years  No      History of Preadolescent Sexual Abuse   History of Preadolescent Sexual Abuse Negative or Male      Psychological Disease   Psychological Disease Negative    Depression Negative      Total Score   Opioid Risk Tool Scoring 3    Opioid Risk Interpretation Low Risk          ORT Scoring interpretation table:  Score <3 = Low Risk for SUD  Score between 4-7 = Moderate Risk for SUD  Score >8 = High Risk for Opioid Abuse   PHQ-2 Depression Scale:  Total score:    PHQ-2 Scoring interpretation table: (Score and probability of major depressive disorder)  Score 0 = No depression  Score 1 = 15.4% Probability  Score 2 = 21.1% Probability  Score 3 = 38.4% Probability  Score 4 = 45.5% Probability  Score 5 = 56.4% Probability  Score 6 = 78.6% Probability   PHQ-9 Depression Scale:  Total score:  PHQ-9 Scoring interpretation table:  Score 0-4 = No depression  Score 5-9 = Mild depression  Score 10-14 = Moderate depression  Score 15-19 = Moderately severe depression  Score 20-27 = Severe depression (2.4 times higher risk of SUD and 2.89 times higher risk of overuse)   Pharmacologic Plan: As per protocol, I have not taken over any controlled substance management, pending the results of ordered tests and/or consults.            Initial impression: Pending review of available data and  ordered tests.  Meds   Current Outpatient Medications:  .  ACCU-CHEK AVIVA PLUS test strip, USE STRIP TO CHECK GLUCOSE ONCE DAILY, Disp: , Rfl: 0 .  albuterol (PROVENTIL HFA;VENTOLIN HFA) 108 (90 Base) MCG/ACT inhaler, Inhale into the lungs every 6 (six) hours as needed for wheezing or shortness of breath., Disp: , Rfl:  .  aspirin EC 81 MG tablet, Take 81 mg by mouth daily., Disp: , Rfl:  .  atorvastatin (LIPITOR) 40 MG tablet, TAKE 1 TABLET BY MOUTH ONCE DAILY, Disp: , Rfl:  .  calcium carbonate (OSCAL) 1500 (600 Ca) MG TABS tablet, Take by mouth 2 (two) times daily with a meal., Disp: , Rfl:  .  clopidogrel (PLAVIX) 75 MG tablet, Take 1 tablet (75 mg total) by mouth daily., Disp: 30 tablet, Rfl: 0 .  Cyanocobalamin (VITAMIN B-12 PO), Take by mouth., Disp: , Rfl:  .  DULoxetine (CYMBALTA) 60 MG capsule, , Disp: , Rfl:  .  finasteride (PROSCAR) 5 MG tablet, TAKE 1 TABLET EVERY DAY, Disp: 90 tablet, Rfl: 3 .  gabapentin (NEURONTIN) 100 MG capsule, Take 100 mg by mouth 3 (three) times daily. $RemoveBefo'200mg'TfoILGnRnEa$  midday and $RemoveBe'300mg'TuHVdYtGK$  at night, Disp: , Rfl:  .  glimepiride (AMARYL) 4 MG tablet, Take by mouth., Disp: , Rfl:  .  glyBURIDE-metformin (GLUCOVANCE) 5-500 MG per tablet, TAKE ONE TABLET BY MOUTH TWICE DAILY WITH MEALS, Disp: , Rfl:  .  INVOKANA 100 MG TABS tablet, Take 100 mg by mouth daily., Disp: , Rfl:  .  losartan (COZAAR) 100 MG tablet, Take 100 mg by mouth daily., Disp: , Rfl:  .  magnesium oxide (MAG-OX) 400 MG tablet, Take 400 mg by mouth 2 (two) times daily. , Disp: , Rfl:  .  metFORMIN (GLUCOPHAGE-XR) 500 MG 24 hr tablet, Take 2 tablets by mouth 2 (two) times daily., Disp: , Rfl:  .  Multiple Vitamin (MULTI-VITAMINS) TABS, Take 1 tablet by mouth daily. , Disp: , Rfl:  .  oxymetazoline (AFRIN) 0.05 % nasal spray, Place 1 spray into both nostrils 2 (two) times daily., Disp: , Rfl:  .  pantoprazole (PROTONIX) 40 MG tablet, Take by mouth., Disp: , Rfl:  .  pioglitazone (ACTOS) 15 MG tablet, TAKE 3  TABLETS ONE TIME DAILY, Disp: , Rfl:  .  Probiotic Product (PROBIOTIC PO), Take by mouth., Disp: , Rfl:  .  sildenafil (REVATIO) 20 MG tablet, 1-5 tablets as needed one hour prior to intercourse, Disp: 30 tablet, Rfl: 3 .  tamsulosin (FLOMAX) 0.4 MG CAPS capsule, Take 1 capsule (0.4 mg total) by mouth daily after supper., Disp: 90 capsule, Rfl: 3 .  umeclidinium-vilanterol (ANORO ELLIPTA) 62.5-25 MCG/INH AEPB, Inhale 1 puff into the lungs daily., Disp: , Rfl:  .  donepezil (ARICEPT) 10 MG tablet, Take by mouth., Disp: , Rfl:   Imaging Review  Cervical Imaging: Cervical MR w/wo contrast: Results for orders placed during the hospital encounter of 11/13/19 MR CERVICAL SPINE W WO  CONTRAST  Narrative CLINICAL DATA:  Initial evaluation for gait disturbance, balance difficulty. History of prior surgery and prostate cancer.  EXAM: MRI CERVICAL SPINE WITHOUT AND WITH CONTRAST  TECHNIQUE: Multiplanar and multiecho pulse sequences of the cervical spine, to include the craniocervical junction and cervicothoracic junction, were obtained without and with intravenous contrast.  CONTRAST:  79mL GADAVIST GADOBUTROL 1 MMOL/ML IV SOLN  COMPARISON:  None available.  FINDINGS: Alignment: Straightening of the normal cervical lordosis. Trace anterolisthesis of C3 on C4, chronic and facet mediated.  Vertebrae: Susceptibility artifact related to prior ACDF at C5-C7 with solid arthrodesis. Vertebral body height maintained without acute or chronic fracture. Bone marrow signal intensity within normal limits. Subcentimeter benign hemangioma noted within the C2 vertebral body. No worrisome osseous lesions. No abnormal marrow edema or enhancement.  Cord: Normal signal and morphology.  Posterior Fossa, vertebral arteries, paraspinal tissues: Visualized brain and posterior fossa within normal limits. Craniocervical junction normal. Paraspinous and prevertebral soft tissues within normal limits. Normal  intravascular flow voids seen within the vertebral arteries bilaterally.  Disc levels:  C2-C3: Negative interspace. Moderate right worse than left facet hypertrophy. No spinal stenosis. Mild bilateral C3 foraminal narrowing.  C3-C4: Mild disc bulge with uncovertebral hypertrophy. Moderate right worse than left facet hypertrophy. Mild ligamentum flavum redundancy. Mild spinal stenosis without cord deformity. Moderate left worse than right C4 foraminal stenosis.  C4-C5: Mild disc bulge with uncovertebral hypertrophy. Mild flattening and effacement of the ventral thecal sac. Moderate left with mild right facet hypertrophy. Mild spinal stenosis without cord deformity. Moderate left with mild right C5 foraminal stenosis.  C5-C6: Prior fusion. No residual spinal stenosis. Residual uncovertebral spurring with moderate left and mild to moderate right C6 foraminal narrowing.  C6-C7: Prior fusion. No residual spinal stenosis. Residual left-sided uncovertebral spurring with mild to moderate left C7 foraminal stenosis.  C7-T1: Minimal disc bulge. Bilateral facet hypertrophy. No significant canal stenosis. Foramina remain patent.  Visualized upper thoracic spine demonstrates no significant finding.  IMPRESSION: 1. Prior ACDF at C5-C7 without residual spinal stenosis. Persistent uncovertebral spurring with residual mild to moderate bilateral C6 and C7 foraminal stenosis as above. 2. Mild disc bulging with uncovertebral and facet hypertrophy at C3-4 and C4-5 with resultant mild spinal stenosis but no cord deformity. Moderate left and mild right C4 and C5 foraminal narrowing. 3. Moderate multilevel facet arthrosis throughout the cervical spine as above, which could contribute to underlying neck pain.   Electronically Signed By: Jeannine Boga M.D. On: 11/14/2019 02:54  Lumbosacral Imaging: Lumbar MR wo contrast: Results for orders placed during the hospital encounter of  09/24/18 MR LUMBAR SPINE WO CONTRAST  Narrative CLINICAL DATA:  Chronic low back pain and stiffness with bilateral foot tingling and weakness. No recent injury.  EXAM: MRI LUMBAR SPINE WITHOUT CONTRAST  TECHNIQUE: Multiplanar, multisequence MR imaging of the lumbar spine was performed. No intravenous contrast was administered.  COMPARISON:  CT lumbar spine 08/21/2014.  FINDINGS: Segmentation:  Standard.  Alignment:  Maintained.  Vertebrae: No fracture, evidence of discitis, or worrisome bone lesion. Small Schmorl's node in the superior endplate of V29 and a small hemangioma in L1 are noted.  Conus medullaris and cauda equina: Conus extends to the L1 level. Conus and cauda equina appear normal.  Paraspinal and other soft tissues: Negative.  Disc levels:  T11-12 is imaged only in the sagittal plane and negative.  T12-L1: Mild facet degenerative disease.  Otherwise negative.  L1-2: Shallow disc bulge and mild ligament flavum thickening. There is mild  central canal stenosis. The foramina are open.  L2-3: Shallow disc bulge with mild ligamentum flavum thickening and facet degenerative change. There is mild central canal and left foraminal narrowing. Right foramen open.  L3-4: Moderate ligamentum flavum thickening and facet degenerative disease. There is a disc bulge eccentrically prominent to the left with a more focal protrusion in foramen. Moderate to moderately severe central canal stenosis is present with narrowing in the left subarticular recess and foramen which could impact the exiting left L3 and descending left roots. Moderate right foraminal narrowing is also seen.  L4-5: Left laminotomy defect. Bilateral facet degenerative change is worse on the left. There is a shallow disc bulge to the central and foramina open.  L5-S1: Moderately severe bilateral facet degenerative change is worse on right. There is a shallow disc bulge without central canal stenosis.  Mild to moderate foraminal narrowing is worse on right.  IMPRESSION: Spondylosis appears worst at L3-4 where moderate to moderately severe central canal stenosis is present. There is also marked left foraminal narrowing and narrowing in the left subarticular recess at this level which could impact both the exiting left L3 and descending left roots. Moderate right narrowing is present at this level.  Status post left laminotomy at L4-5. There is a shallow disc bulge to the left but no stenosis.  Multilevel facet degenerative disease is worst at L4-5 and S1.   Electronically Signed By: Inge Rise M.D. On: 09/24/2018 14:17  Lumbar MR w/wo contrast: Results for orders placed during the hospital encounter of 11/13/19 MR Lumbar Spine W Wo Contrast  Narrative CLINICAL DATA:  Chronic low back and bilateral leg pain.  EXAM: MRI LUMBAR SPINE WITHOUT AND WITH CONTRAST  TECHNIQUE: Multiplanar and multiecho pulse sequences of the lumbar spine were obtained without and with intravenous contrast.  CONTRAST:  10mL GADAVIST GADOBUTROL 1 MMOL/ML IV SOLN  COMPARISON:  MRI lumbar spine dated September 24, 2018.  FINDINGS: Segmentation:  Standard.  Alignment:  Unchanged trace anterolisthesis at L4-L5.  Vertebrae: No fracture, evidence of discitis, or bone lesion. Enlarging Schmorl's node involving the L3 inferior endplate. Unchanged L1 hemangioma.  Conus medullaris and cauda equina: Conus extends to the L1-L2 level. Conus and cauda equina appear normal. No intradural enhancement.  Paraspinal and other soft tissues: Negative.  Disc levels:  T12-L1:  Negative.  L1-L2:  Negative.  L2-L3: Progressive mild disc bulging with prominent posterior epidural fat. Unchanged mild bilateral facet arthropathy. Progressive now moderate spinal canal stenosis. No neuroforaminal stenosis.  L3-L4: Unchanged diffuse disc bulging with superimposed left foraminal disc protrusion. Unchanged  bilateral facet arthropathy. Unchanged moderate to severe spinal canal stenosis with severe left and moderate right neuroforaminal stenosis.  L4-L5: Prior left laminotomy. Unchanged shallow disc bulge eccentric to the left and mild bilateral facet arthropathy. No stenosis.  L5-S1: Negative disc. Unchanged moderate to severe bilateral facet arthropathy. Unchanged moderate bilateral neuroforaminal stenosis. No spinal canal stenosis.  IMPRESSION: 1. Multilevel degenerative changes of the lumbar spine as described above, progressed at L2-L3 with there is now moderate spinal canal stenosis. 2. Unchanged moderate to severe spinal canal stenosis and severe left neuroforaminal stenosis at L3-L4. 3. Unchanged moderate bilateral neuroforaminal stenosis at L5-S1.   Electronically Signed By: Titus Dubin M.D. On: 11/14/2019 16:30  Complexity Note: Imaging results reviewed. Results shared with Mr. Daigle, using Layman's terms.                        ROS  Cardiovascular: Daily  Aspirin intake, High blood pressure, Heart surgery, Heart murmur, Blood thinners:  Anticoagulant and Needs antibiotics prior to dental procedures Pulmonary or Respiratory: Smoking, Snoring  and Temporary stoppage of breathing during sleep Neurological: Stroke (Residual deficits or weakness: recall issues) and Incontinence:  Urinary Psychological-Psychiatric: No reported psychological or psychiatric signs or symptoms such as difficulty sleeping, anxiety, depression, delusions or hallucinations (schizophrenial), mood swings (bipolar disorders) or suicidal ideations or attempts Gastrointestinal: Reflux or heatburn Genitourinary: Passing kidney stones Hematological: Brusing easily and Bleeding easily Endocrine: High blood sugar controlled without the use of insulin (NIDDM) Rheumatologic: Joint aches and or swelling due to excess weight (Osteoarthritis) Musculoskeletal: Negative for myasthenia gravis, muscular dystrophy,  multiple sclerosis or malignant hyperthermia Work History: Retired  Allergies  Mr. Fout is allergic to hydrocodone and oxycodone-acetaminophen.  Laboratory Chemistry Profile   Renal Lab Results  Component Value Date   BUN 20 01/30/2016   CREATININE 1.05 02/02/2016   GFRAA >60 02/02/2016   GFRNONAA >60 02/02/2016   SPECGRAV 1.020 01/10/2017   PHUR 8.5 (H) 01/10/2017   PROTEINUR Negative 01/10/2017     Electrolytes Lab Results  Component Value Date   NA 139 01/30/2016   K 4.5 01/30/2016   CL 101 01/30/2016   CALCIUM 9.7 01/30/2016     Hepatic Lab Results  Component Value Date   AST 25 01/30/2016   ALT 21 01/30/2016   ALBUMIN 4.2 01/30/2016   ALKPHOS 50 01/30/2016     ID No results found for: LYMEIGGIGMAB, HIV, [place], STAPHAUREUS, MRSAPCR, HCVAB, PREGTESTUR, RMSFIGG, QFVRPH1IGG, QFVRPH2IGG, LYMEIGGIGMAB   Bone No results found for: VD25OH, [id number], [id number], [id number], 25OHVITD1, 25OHVITD2, 25OHVITD3, TESTOFREE, TESTOSTERONE   Endocrine Lab Results  Component Value Date   GLUCOSE 124 (H) 01/30/2016   GLUCOSEU Negative 01/10/2017   HGBA1C 6.6 (H) 01/31/2016   TSH 1.949 02/01/2016     Neuropathy Lab Results  Component Value Date   VITAMINB12 842 02/01/2016   FOLATE 33.0 02/01/2016   HGBA1C 6.6 (H) 01/31/2016     CNS No results found for: COLORCSF, APPEARCSF, RBCCOUNTCSF, WBCCSF, POLYSCSF, LYMPHSCSF, EOSCSF, PROTEINCSF, GLUCCSF, JCVIRUS, CSFOLI, IGGCSF, LABACHR, ACETBL, LABACHR, ACETBL   Inflammation (CRP: Acute  ESR: Chronic) Lab Results  Component Value Date   ESRSEDRATE 6 02/01/2016     Rheumatology No results found for: RF, ANA, LABURIC, URICUR, LYMEIGGIGMAB, LYMEABIGMQN, HLAB27   Coagulation Lab Results  Component Value Date   INR 0.90 01/30/2016   LABPROT 12.1 01/30/2016   APTT 30 01/30/2016   PLT 175 12/09/2017     Cardiovascular Lab Results  Component Value Date   TROPONINI <0.03 01/30/2016   HGB 12.2 (L) 12/09/2017    HCT 37.0 (L) 12/09/2017     Screening No results found for: SARSCOV2NAA, COVIDSOURCE, STAPHAUREUS, MRSAPCR, HCVAB, HIV, PREGTESTUR   Cancer No results found for: CEA, CA125, LABCA2   Allergens No results found for: ALMOND, APPLE, ASPARAGUS, AVOCADO, BANANA, BARLEY, BASIL, BAYLEAF, GREENBEAN, LIMABEAN, WHITEBEAN, BEEFIGE, REDBEET, BLUEBERRY, BROCCOLI, CABBAGE, MELON, CARROT, CASEIN, CASHEWNUT, CAULIFLOWER, CELERY     Note: Lab results reviewed.  [place]  Drug: Mr. Budney  reports no history of drug use. Alcohol:  reports current alcohol use of about 1.0 standard drink of alcohol per week. Tobacco:  reports that he quit smoking about 46 years ago. His smoking use included cigarettes. He has never used smokeless tobacco. Medical:  has a past medical history of Allergy, Anemia, Arthritis, Back pain, Barrett esophagus, BPH (benign prostatic hyperplasia), Chronic kidney disease, COPD (chronic obstructive pulmonary disease) ([place]),  Coronary artery disease, Depression, Diabetes mellitus without complication ([place]), Diverticulitis, Dysphagia, Esophageal reflux, Esophageal reflux, GERD (gastroesophageal reflux disease), Heart murmur, Hyperlipidemia, Hypersomnia, Hypertension, Kidney stones, Low testosterone, OAB (overactive bladder), Obesity, Presence of dental bridge, Rheumatic fever, Sleep apnea, Stroke ([place]) (01/2016), Vertigo, and Vitamin B12 deficiency. Family: family history includes Anesthesia problems in his father and mother; Prostate cancer in his brother; Stroke in his father.  Past Surgical History:  Procedure Laterality Date  . BACK SURGERY    . CARDIAC CATHETERIZATION  08/11/2014   Procedure: Left Heart Cath and Coronary Angiography;  Surgeon: Teodoro Spray, MD;  Location: [facility];  Service: Cardiovascular;;  . CARDIAC CATHETERIZATION N/A 11/17/2014   Procedure: Right Heart Cath;  Surgeon: Teodoro Spray, MD;  Location: [facility];  Service: Cardiovascular;   Laterality: N/A;  . CAROTID STENT    . CATARACT EXTRACTION W/PHACO Left 04/03/2016   Procedure: CATARACT EXTRACTION PHACO AND INTRAOCULAR LENS PLACEMENT ([place]) left diabetic;  Surgeon: Eulogio Bear, MD;  Location: [facility];  Service: Ophthalmology;  Laterality: Left;  diabetic - oral meds sleep apnea  . CATARACT EXTRACTION W/PHACO Right 05/01/2016   Procedure: CATARACT EXTRACTION PHACO AND INTRAOCULAR LENS PLACEMENT ([place]) Right diabetic;  Surgeon: Eulogio Bear, MD;  Location: [facility];  Service: Ophthalmology;  Laterality: Right;  Diabetic oral meds sleep apnea  . CERVICAL SPINE SURGERY    . COLONOSCOPY    . COLONOSCOPY WITH PROPOFOL N/A 06/10/2015   Procedure: COLONOSCOPY WITH PROPOFOL;  Surgeon: Manya Silvas, MD;  Location: [facility] ENDOSCOPY;  Service: Endoscopy;  Laterality: N/A;  . CORONARY ANGIOPLASTY    . ESOPHAGOGASTRODUODENOSCOPY (EGD) WITH PROPOFOL N/A 06/28/2017   Procedure: ESOPHAGOGASTRODUODENOSCOPY (EGD) WITH PROPOFOL;  Surgeon: Manya Silvas, MD;  Location: [facility] ENDOSCOPY;  Service: Endoscopy;  Laterality: N/A;  . ESOPHAGOGASTRODUODENOSCOPY ENDOSCOPY    . EYE SURGERY    . LUMBAR SPINE SURGERY    . THORACIC SPINE SURGERY    . TONSILLECTOMY     Active Ambulatory Problems    Diagnosis Date Noted  . Arthritis of knee 07/03/2013  . Chronic kidney disease 07/14/2014  . Arteriosclerosis of coronary artery 07/03/2013  . Clinical depression 07/14/2014  . Type 2 diabetes mellitus with diabetic neuropathy, without long-term current use of insulin ([place]) 07/03/2013  . Benign essential HTN 07/03/2013  . Gait instability 05/05/2014  . HLD (hyperlipidemia) 07/03/2013  . Breath shortness 07/03/2013  . Sleep apnea 06/18/2013  . Acute cerebrovascular accident (CVA) ([place]) 01/30/2016  . Morbid obesity with BMI of 40.0-44.9, adult ([place]) 04/03/2018  . Chronic low back pain (1ry area of Pain) (Bilateral) (R>L) w/o sciatica 12/15/2019  . Chronic obstructive  pulmonary disease ([place]) 09/18/2018  . Gastroesophageal reflux disease without esophagitis 07/29/2017  . History of Barrett's esophagus 01/08/2001  . Hyperlipidemia associated with type 2 diabetes mellitus ([place]) 02/05/2018  . Lumbar stenosis with neurogenic claudication 12/15/2019  . Memory loss, short term 10/09/2019  . Other dysphagia 07/29/2017  . Atherosclerotic heart disease of native coronary artery without angina pectoris 07/03/2013  . Chronic pain syndrome 06/15/2020  . Pharmacologic therapy 06/15/2020  . Disorder of skeletal system 06/15/2020  . Problems influencing health status 06/15/2020  . Abnormal MRI, cervical spine (11/14/2019) 06/15/2020  . Abnormal MRI, lumbar spine (11/14/2019) 06/15/2020  . History of lumbar laminectomy 06/15/2020  . History of fusion of cervical spine 06/15/2020  . DDD (degenerative disc disease), cervical 06/15/2020  . DDD (degenerative disc disease), lumbosacral 06/15/2020  .  Lumbosacral facet arthropathy 06/15/2020  . Cervical facet hypertrophy 06/15/2020  . DJD (degenerative joint disease) of cervical spine 06/15/2020  . Chronic anticoagulation (Plavix) 06/15/2020  . Lumbar facet joint pain (Bilateral) 06/15/2020  . History of CVA (cerebrovascular accident) 06/15/2020  . Poor historian 06/15/2020   Resolved Ambulatory Problems    Diagnosis Date Noted  . No Resolved Ambulatory Problems   Past Medical History:  Diagnosis Date  . Allergy   . Anemia   . Arthritis   . Back pain   . Barrett esophagus   . BPH (benign prostatic hyperplasia)   . COPD (chronic obstructive pulmonary disease) ([place])   . Coronary artery disease   . Depression   . Diabetes mellitus without complication ([place])   . Diverticulitis   . Dysphagia   . Esophageal reflux   . Esophageal reflux   . GERD (gastroesophageal reflux disease)   . Heart murmur   . Hyperlipidemia   . Hypersomnia   . Hypertension   . Kidney stones   . Low testosterone   . OAB (overactive  bladder)   . Obesity   . Presence of dental bridge   . Rheumatic fever   . Stroke ([place]) 01/2016  . Vertigo   . Vitamin B12 deficiency    Constitutional Exam  General appearance: Well nourished, well developed, and well hydrated. In no apparent acute distress Vitals:   06/15/20 1003  BP: (!) 130/98  Pulse: 100  Resp: 16  Temp: 98.3 F (36.8 C)  TempSrc: Oral  SpO2: 97%  Weight: 260 lb (117.9 kg)  Height: $Remove'5\' 5"'dCCQxwp$  (1.651 m)   BMI Assessment: Estimated body mass index is 43.27 kg/m as calculated from the following:   Height as of this encounter: $RemoveBeforeD'5\' 5"'[id number]$  (1.651 m).   Weight as of this encounter: 260 lb (117.9 kg).  BMI interpretation table: BMI level Category Range association with higher incidence of chronic pain  <18 kg/m2 Underweight   18.5-24.9 kg/m2 Ideal body weight   25-29.9 kg/m2 Overweight Increased incidence by 20%  30-34.9 kg/m2 Obese (Class I) Increased incidence by 68%  35-39.9 kg/m2 Severe obesity (Class II) Increased incidence by 136%  >40 kg/m2 Extreme obesity (Class III) Increased incidence by 254%   Patient's current BMI Ideal Body weight  Body mass index is 43.27 kg/m. Ideal body weight: 61.5 kg (135 lb 9.3 oz) Adjusted ideal body weight: 84.1 kg (185 lb 5.6 oz)   BMI Readings from Last 4 Encounters:  06/15/20 43.27 kg/m  04/26/20 43.27 kg/m  04/20/20 43.27 kg/m  11/05/19 44.10 kg/m   Wt Readings from Last 4 Encounters:  06/15/20 260 lb (117.9 kg)  04/26/20 260 lb (117.9 kg)  04/20/20 260 lb (117.9 kg)  11/05/19 265 lb (120.2 kg)    Psych/Mental status: Alert, oriented x 3 (person, place, & time)       Eyes: PERLA Respiratory: No evidence of acute respiratory distress  Assessment  Primary Diagnosis & Pertinent Problem List: The primary encounter diagnosis was Chronic low back pain (1ry area of Pain) (Bilateral) (R>L) w/o sciatica. Diagnoses of Lumbosacral facet arthropathy, Lumbar facet joint pain (Bilateral), DDD (degenerative disc disease),  lumbosacral, History of lumbar laminectomy, Abnormal MRI, lumbar spine (11/14/2019), History of fusion of cervical spine, Morbid obesity with BMI of 40.0-44.9, adult ([place]), Chronic pain syndrome, Pharmacologic therapy, Disorder of skeletal system, Problems influencing health status, Chronic anticoagulation (Plavix), History of CVA (cerebrovascular accident), and Poor historian were also pertinent to this visit.  Visit Diagnosis (New problems to  examiner): 1. Chronic low back pain (1ry area of Pain) (Bilateral) (R>L) w/o sciatica   2. Lumbosacral facet arthropathy   3. Lumbar facet joint pain (Bilateral)   4. DDD (degenerative disc disease), lumbosacral   5. History of lumbar laminectomy   6. Abnormal MRI, lumbar spine (11/14/2019)   7. History of fusion of cervical spine   8. Morbid obesity with BMI of 40.0-44.9, adult ([place])   9. Chronic pain syndrome   10. Pharmacologic therapy   11. Disorder of skeletal system   12. Problems influencing health status   13. Chronic anticoagulation (Plavix)   14. History of CVA (cerebrovascular accident)   15. Poor historian    Plan of Care (Initial workup plan)  Note: Mr. Matheny was reminded that as per protocol, today's visit has been an evaluation only. We have not taken over the patient's controlled substance management.  Problem-specific plan: No problem-specific Assessment & Plan notes found for this encounter.   Lab Orders     Compliance Drug Analysis, Ur     Comp. Metabolic Panel (12)     Magnesium     Vitamin B12     Sedimentation rate     25-Hydroxy vitamin D Lcms D2+D3     C-reactive protein  Imaging Orders     DG Lumbar Spine Complete W/Bend Referral Orders  No referral(s) requested today   Procedure Orders    No procedure(s) ordered today   Pharmacotherapy (current): Medications ordered:  No orders of the defined types were placed in this encounter.  Medications administered during this visit: Felipe Drone had no  medications administered during this visit.   Pharmacological management options:  Opioid Analgesics: The patient was informed that there is no guarantee that he would be a candidate for opioid analgesics. The decision will be made following CDC guidelines. This decision will be based on the results of diagnostic studies, as well as Mr. Mcglocklin risk profile.   Membrane stabilizer: To be determined at a later time  Muscle relaxant: To be determined at a later time  NSAID: To be determined at a later time  Other analgesic(s): To be determined at a later time   Interventional management options: Mr. Paglia was informed that there is no guarantee that he would be a candidate for interventional therapies. The decision will be based on the results of diagnostic studies, as well as Mr. Falletta risk profile.  Procedure(s) under consideration:  PLAVIX Anticoagulation (Stop: 7-10 days  Restart: 2 hours) Diagnostic bilateral lumbar facet block #1    Provider-requested follow-up: Return for evaluation day (40 min) 2nd Visit "Plan of Care".  Future Appointments  Date Time Provider [place]  07/27/2020  8:00 AM Milinda Pointer, MD [facility] None  10/28/2020  9:00 AM [facility] None  11/01/2020  3:30 PM Hollice Espy, MD [facility] None  11/04/2020  9:30 AM Noreene Filbert, MD [facility] None    Note by: Gaspar Cola, MD Date: 06/15/2020; Time: 12:43 PM

## 2020-06-15 ENCOUNTER — Ambulatory Visit
Admission: RE | Admit: 2020-06-15 | Discharge: 2020-06-15 | Disposition: A | Discharge status: Home / Self Care | Payer: Medicare HMO | Source: Ambulatory Visit | Attending: Pain Medicine | Admitting: Pain Medicine

## 2020-06-15 ENCOUNTER — Ambulatory Visit (HOSPITAL_BASED_OUTPATIENT_CLINIC_OR_DEPARTMENT_OTHER): Payer: Medicare HMO | Admitting: Pain Medicine

## 2020-06-15 ENCOUNTER — Encounter: Payer: Self-pay | Admitting: Pain Medicine

## 2020-06-15 ENCOUNTER — Other Ambulatory Visit: Payer: Self-pay

## 2020-06-15 ENCOUNTER — Other Ambulatory Visit
Admission: RE | Admit: 2020-06-15 | Discharge: 2020-06-15 | Disposition: A | Discharge status: Home / Self Care | Payer: Medicare HMO | Source: Ambulatory Visit | Attending: Pain Medicine | Admitting: Pain Medicine

## 2020-06-15 VITALS — BP 130/98 | HR 100 | Temp 98.3°F | Resp 16 | Ht 65.0 in | Wt 260.0 lb

## 2020-06-15 DIAGNOSIS — M5459 Other low back pain: Secondary | ICD-10-CM

## 2020-06-15 DIAGNOSIS — Z6841 Body Mass Index (BMI) 40.0 and over, adult: Secondary | ICD-10-CM

## 2020-06-15 DIAGNOSIS — Z8673 Personal history of transient ischemic attack (TIA), and cerebral infarction without residual deficits: Secondary | ICD-10-CM

## 2020-06-15 DIAGNOSIS — M47816 Spondylosis without myelopathy or radiculopathy, lumbar region: Secondary | ICD-10-CM | POA: Diagnosis not present

## 2020-06-15 DIAGNOSIS — Z9889 Other specified postprocedural states: Secondary | ICD-10-CM | POA: Insufficient documentation

## 2020-06-15 DIAGNOSIS — M5030 Other cervical disc degeneration, unspecified cervical region: Secondary | ICD-10-CM | POA: Insufficient documentation

## 2020-06-15 DIAGNOSIS — G894 Chronic pain syndrome: Secondary | ICD-10-CM | POA: Insufficient documentation

## 2020-06-15 DIAGNOSIS — M899 Disorder of bone, unspecified: Secondary | ICD-10-CM

## 2020-06-15 DIAGNOSIS — M51379 Other intervertebral disc degeneration, lumbosacral region without mention of lumbar back pain or lower extremity pain: Secondary | ICD-10-CM

## 2020-06-15 DIAGNOSIS — M545 Low back pain, unspecified: Secondary | ICD-10-CM | POA: Insufficient documentation

## 2020-06-15 DIAGNOSIS — R937 Abnormal findings on diagnostic imaging of other parts of musculoskeletal system: Secondary | ICD-10-CM | POA: Insufficient documentation

## 2020-06-15 DIAGNOSIS — Z7901 Long term (current) use of anticoagulants: Secondary | ICD-10-CM | POA: Insufficient documentation

## 2020-06-15 DIAGNOSIS — M47817 Spondylosis without myelopathy or radiculopathy, lumbosacral region: Secondary | ICD-10-CM | POA: Insufficient documentation

## 2020-06-15 DIAGNOSIS — Z79899 Other long term (current) drug therapy: Secondary | ICD-10-CM | POA: Diagnosis not present

## 2020-06-15 DIAGNOSIS — Z789 Other specified health status: Secondary | ICD-10-CM

## 2020-06-15 DIAGNOSIS — G8929 Other chronic pain: Secondary | ICD-10-CM

## 2020-06-15 DIAGNOSIS — M5137 Other intervertebral disc degeneration, lumbosacral region: Secondary | ICD-10-CM

## 2020-06-15 DIAGNOSIS — Z981 Arthrodesis status: Secondary | ICD-10-CM | POA: Diagnosis not present

## 2020-06-15 DIAGNOSIS — M5134 Other intervertebral disc degeneration, thoracic region: Secondary | ICD-10-CM | POA: Diagnosis not present

## 2020-06-15 DIAGNOSIS — M47812 Spondylosis without myelopathy or radiculopathy, cervical region: Secondary | ICD-10-CM | POA: Insufficient documentation

## 2020-06-15 DIAGNOSIS — I700 Atherosclerosis of aorta: Secondary | ICD-10-CM | POA: Diagnosis not present

## 2020-06-15 LAB — COMPREHENSIVE METABOLIC PANEL
ALT: 21 U/L (ref 0–44)
AST: 22 U/L (ref 15–41)
Albumin: 4 g/dL (ref 3.5–5.0)
Alkaline Phosphatase: 48 U/L (ref 38–126)
Anion gap: 10 (ref 5–15)
BUN: 21 mg/dL (ref 8–23)
CO2: 27 mmol/L (ref 22–32)
Calcium: 9.5 mg/dL (ref 8.9–10.3)
Chloride: 100 mmol/L (ref 98–111)
Creatinine, Ser: 0.92 mg/dL (ref 0.61–1.24)
GFR, Estimated: >60 mL/min (ref >60)
Glucose, Bld: 98 mg/dL (ref 70–99)
Potassium: 4.9 mmol/L (ref 3.5–5.1)
Sodium: 137 mmol/L (ref 135–145)
Total Bilirubin: 0.8 mg/dL (ref 0.3–1.2)
Total Protein: 6.7 g/dL (ref 6.5–8.1)

## 2020-06-15 LAB — C-REACTIVE PROTEIN: CRP: 0.6 mg/dL (ref <1.0)

## 2020-06-15 LAB — SEDIMENTATION RATE: Sed Rate: 13 mm/hr (ref 0–20)

## 2020-06-15 LAB — VITAMIN B12: Vitamin B-12: 644 pg/mL (ref 180–914)

## 2020-06-15 LAB — MAGNESIUM: Magnesium: 1.6 mg/dL — ABNORMAL LOW (ref 1.7–2.4)

## 2020-06-15 NOTE — Patient Instructions (Signed)
____________________________________________________________________________________________  General Risks and Possible Complications  Patient Responsibilities: It is important that you read this as it is part of your informed consent. It is our duty to inform you of the risks and possible complications associated with treatments offered to you. It is your responsibility as a patient to read this and to ask questions about anything that is not clear or that you believe was not covered in this document.  Patient's Rights: You have the right to refuse treatment. You also have the right to change your mind, even after initially having agreed to have the treatment done. However, under this last option, if you wait until the last second to change your mind, you may be charged for the materials used up to that point.  Introduction: Medicine is not an Chief Strategy Officer. Everything in Medicine, including the lack of treatment(s), carries the potential for danger, harm, or loss (which is by definition: Risk). In Medicine, a complication is a secondary problem, condition, or disease that can aggravate an already existing one. All treatments carry the risk of possible complications. The fact that a side effects or complications occurs, does not imply that the treatment was conducted incorrectly. It must be clearly understood that these can happen even when everything is done following the highest safety standards.  No treatment: You can choose not to proceed with the proposed treatment alternative. The "PRO(s)" would include: avoiding the risk of complications associated with the therapy. The "CON(s)" would include: not getting any of the treatment benefits. These benefits fall under one of three categories: diagnostic; therapeutic; and/or palliative. Diagnostic benefits include: getting information which can ultimately lead to improvement of the disease or symptom(s). Therapeutic benefits are those associated with the  successful treatment of the disease. Finally, palliative benefits are those related to the decrease of the primary symptoms, without necessarily curing the condition (example: decreasing the pain from a flare-up of a chronic condition, such as incurable terminal cancer).  General Risks and Complications: These are associated to most interventional treatments. They can occur alone, or in combination. They fall under one of the following six (6) categories: no benefit or worsening of symptoms; bleeding; infection; nerve damage; allergic reactions; and/or death. 1. No benefits or worsening of symptoms: In Medicine there are no guarantees, only probabilities. No healthcare provider can ever guarantee that a medical treatment will work, they can only state the probability that it may. Furthermore, there is always the possibility that the condition may worsen, either directly, or indirectly, as a consequence of the treatment. 2. Bleeding: This is more common if the patient is taking a blood thinner, either prescription or over the counter (example: Goody Powders, Fish oil, Aspirin, Garlic, etc.), or if suffering a condition associated with impaired coagulation (example: Hemophilia, cirrhosis of the liver, low platelet counts, etc.). However, even if you do not have one on these, it can still happen. If you have any of these conditions, or take one of these drugs, make sure to notify your treating physician. 3. Infection: This is more common in patients with a compromised immune system, either due to disease (example: diabetes, cancer, human immunodeficiency virus [HIV], etc.), or due to medications or treatments (example: therapies used to treat cancer and rheumatological diseases). However, even if you do not have one on these, it can still happen. If you have any of these conditions, or take one of these drugs, make sure to notify your treating physician. 4. Nerve Damage: This is more common when the  treatment is  an invasive one, but it can also happen with the use of medications, such as those used in the treatment of cancer. The damage can occur to small secondary nerves, or to large primary ones, such as those in the spinal cord and brain. This damage may be temporary or permanent and it may lead to impairments that can range from temporary numbness to permanent paralysis and/or brain death. 5. Allergic Reactions: Any time a substance or material comes in contact with our body, there is the possibility of an allergic reaction. These can range from a mild skin rash (contact dermatitis) to a severe systemic reaction (anaphylactic reaction), which can result in death. 6. Death: In general, any medical intervention can result in death, most of the time due to an unforeseen complication. ____________________________________________________________________________________________   ______________________________________________________________________________________________  Specialty Pain Scale  Introduction:  There are significant differences in how pain is reported. The word pain usually refers to physical pain, but it is also a common synonym of suffering. The medical community uses a scale from 0 (zero) to 10 (ten) to report pain level. Zero (0) is described as "no pain", while ten (10) is described as "the worse pain you can imagine". The problem with this scale is that physical pain is reported along with suffering. Suffering refers to mental pain, or more often yet it refers to any unpleasant feeling, emotion or aversion associated with the perception of harm or threat of harm. It is the psychological component of pain.  Pain Specialists prefer to separate the two components. The pain scale used by this practice is the Verbal Numerical Rating Scale (VNRS-11). This scale is for the physical pain only. DO NOT INCLUDE how your pain psychologically affects you. This scale is for adults 78 years of age and  older. It has 11 (eleven) levels. The 1st level is 0/10. This means: "right now, I have no pain". In the context of pain management, it also means: "right now, my physical pain is under control with the current therapy".  General Information:  The scale should reflect your current level of pain. Unless you are specifically asked for the level of your worst pain, or your average pain. If you are asked for one of these two, then it should be understood that it is over the past 24 hours.  Levels 1 (one) through 5 (five) are described below, and can be treated as an outpatient. Ambulatory pain management facilities such as ours are more than adequate to treat these levels. Levels 6 (six) through 10 (ten) are also described below, however, these must be treated as a hospitalized patient. While levels 6 (six) and 7 (seven) may be evaluated at an urgent care facility, levels 8 (eight) through 10 (ten) constitute medical emergencies and as such, they belong in a hospital's emergency department. When having these levels (as described below), do not come to our office. Our facility is not equipped to manage these levels. Go directly to an urgent care facility or an emergency department to be evaluated.  Definitions:  Activities of Daily Living (ADL): Activities of daily living (ADL or ADLs) is a term used in healthcare to refer to people's daily self-care activities. Health professionals often use a person's ability or inability to perform ADLs as a measurement of their functional status, particularly in regard to people post injury, with disabilities and the elderly. There are two ADL levels: Basic and Instrumental. Basic Activities of Daily Living (BADL  or BADLs) consist of self-care  tasks that include: Bathing and showering; personal hygiene and grooming (including brushing/combing/styling hair); dressing; Toilet hygiene (getting to the toilet, cleaning oneself, and getting back up); eating and self-feeding (not  including cooking or chewing and swallowing); functional mobility, often referred to as "transferring", as measured by the ability to walk, get in and out of bed, and get into and out of a chair; the broader definition (moving from one place to another while performing activities) is useful for people with different physical abilities who are still able to get around independently. Basic ADLs include the things many people do when they get up in the morning and get ready to go out of the house: get out of bed, go to the toilet, bathe, dress, groom, and eat. On the average, loss of function typically follows a particular order. Hygiene is the first to go, followed by loss of toilet use and locomotion. The last to go is the ability to eat. When there is only one remaining area in which the person is independent, there is a 62.9% chance that it is eating and only a 3.5% chance that it is hygiene. Instrumental Activities of Daily Living (IADL or IADLs) are not necessary for fundamental functioning, but they let an individual live independently in a community. IADL consist of tasks that include: cleaning and maintaining the house; home establishment and maintenance; care of others (including selecting and supervising caregivers); care of pets; child rearing; managing money; managing financials (investments, etc.); meal preparation and cleanup; shopping for groceries and necessities; moving within the community; safety procedures and emergency responses; health management and maintenance (taking prescribed medications); and using the telephone or other form of communication.  Instructions:  Most patients tend to report their pain as a combination of two factors, their physical pain and their psychosocial pain. This last one is also known as "suffering" and it is reflection of how physical pain affects you socially and psychologically. From now on, report them separately.  From this point on, when asked to report  your pain level, report only your physical pain. Use the following table for reference.  Pain Clinic Pain Levels (0-5/10)  Pain Level Score  Description  No Pain 0   Mild pain 1 Nagging, annoying, but does not interfere with basic activities of daily living (ADL). Patients are able to eat, bathe, get dressed, toileting (being able to get on and off the toilet and perform personal hygiene functions), transfer (move in and out of bed or a chair without assistance), and maintain continence (able to control bladder and bowel functions). Blood pressure and heart rate are unaffected. A normal heart rate for a healthy adult ranges from 60 to 100 bpm (beats per minute).   Mild to moderate pain 2 Noticeable and distracting. Impossible to hide from other people. More frequent flare-ups. Still possible to adapt and function close to normal. It can be very annoying and may have occasional stronger flare-ups. With discipline, patients may get used to it and adapt.   Moderate pain 3 Interferes significantly with activities of daily living (ADL). It becomes difficult to feed, bathe, get dressed, get on and off the toilet or to perform personal hygiene functions. Difficult to get in and out of bed or a chair without assistance. Very distracting. With effort, it can be ignored when deeply involved in activities.   Moderately severe pain 4 Impossible to ignore for more than a few minutes. With effort, patients may still be able to manage work or participate in  some social activities. Very difficult to concentrate. Signs of autonomic nervous system discharge are evident: dilated pupils (mydriasis); mild sweating (diaphoresis); sleep interference. Heart rate becomes elevated (>115 bpm). Diastolic blood pressure (lower number) rises above 100 mmHg. Patients find relief in laying down and not moving.   Severe pain 5 Intense and extremely unpleasant. Associated with frowning face and frequent crying. Pain overwhelms the  senses.  Ability to do any activity or maintain social relationships becomes significantly limited. Conversation becomes difficult. Pacing back and forth is common, as getting into a comfortable position is nearly impossible. Pain wakes you up from deep sleep. Physical signs will be obvious: pupillary dilation; increased sweating; goosebumps; brisk reflexes; cold, clammy hands and feet; nausea, vomiting or dry heaves; loss of appetite; significant sleep disturbance with inability to fall asleep or to remain asleep. When persistent, significant weight loss is observed due to the complete loss of appetite and sleep deprivation.  Blood pressure and heart rate becomes significantly elevated. Caution: If elevated blood pressure triggers a pounding headache associated with blurred vision, then the patient should immediately seek attention at an urgent or emergency care unit, as these may be signs of an impending stroke.    Emergency Department Pain Levels (6-10/10)  Emergency Room Pain 6 Severely limiting. Requires emergency care and should not be seen or managed at an outpatient pain management facility. Communication becomes difficult and requires great effort. Assistance to reach the emergency department may be required. Facial flushing and profuse sweating along with potentially dangerous increases in heart rate and blood pressure will be evident.   Distressing pain 7 Self-care is very difficult. Assistance is required to transport, or use restroom. Assistance to reach the emergency department will be required. Tasks requiring coordination, such as bathing and getting dressed become very difficult.   Disabling pain 8 Self-care is no longer possible. At this level, pain is disabling. The individual is unable to do even the most "basic" activities such as walking, eating, bathing, dressing, transferring to a bed, or toileting. Fine motor skills are lost. It is difficult to think clearly.   Incapacitating pain  9 Pain becomes incapacitating. Thought processing is no longer possible. Difficult to remember your own name. Control of movement and coordination are lost.   The worst pain imaginable 10 At this level, most patients pass out from pain. When this level is reached, collapse of the autonomic nervous system occurs, leading to a sudden drop in blood pressure and heart rate. This in turn results in a temporary and dramatic drop in blood flow to the brain, leading to a loss of consciousness. Fainting is one of the body's self defense mechanisms. Passing out puts the brain in a calmed state and causes it to shut down for a while, in order to begin the healing process.    Summary: 1.   Refer to this scale when providing [place] with your pain level. 2.   Be accurate and careful when reporting your pain level. This will help with your care. 3.   Over-reporting your pain level will lead to loss of credibility. 4.   Even a level of 1/10 means that there is pain and will be treated at our facility. 5.   High, inaccurate reporting will be documented as "Symptom Exaggeration", leading to loss of credibility and suspicions of possible secondary gains such as obtaining more narcotics, or wanting to appear disabled, for fraudulent reasons. 6.   Only pain levels of 5 or below will be seen  at our facility. 7.   Pain levels of 6 and above will be sent to the Emergency Department and the appointment cancelled.  ______________________________________________________________________________________________     ______________________________________________________________________________________________  Body mass index (BMI)  Body mass index (BMI) is a common tool for deciding whether a person has an appropriate body weight.  It measures a persons weight in relation to their height.   According to the [employer] of health (NIH): Marland Kitchen A BMI of less than 18.5 means that a person is underweight. . A BMI of between  18.5 and 24.9 is ideal. . A BMI of between 25 and 29.9 is overweight. . A BMI over 30 indicates obesity.  Weight Management Required  URGENT: Your weight has been found to be adversely affecting your health.  Dear Taylor Austin:  Your current Estimated body mass index is 43.27 kg/m as calculated from the following:   Height as of 04/20/20: _0  (1.651 m).   Weight as of 04/26/20: 260 lb (117.9 kg).  Please use the table below to identify your weight category and associated incidence of chronic pain, secondary to your weight.  Body Mass Index (BMI) Classification BMI level (kg/m2) Category Associated incidence of chronic pain  <18  Underweight   18.5-24.9 Ideal body weight   25-29.9 Overweight  20%  30-34.9 Obese (Class I)  68%  35-39.9 Severe obesity (Class II)  136%  >40 Extreme obesity (Class III)  254%   In addition: You will be considered "Morbidly Obese", if your BMI is above 30 and you have one or more of the following conditions which are known to be caused and/or directly associated with obesity: 1.    Type 2 Diabetes (Which in turn can lead to cardiovascular diseases (CVD), stroke, peripheral vascular diseases (PVD), retinopathy, nephropathy, and neuropathy) 2.    Cardiovascular Disease (High Blood Pressure; Congestive Heart Failure; High Cholesterol; Coronary Artery Disease; Angina; or History of Heart Attacks) 3.    Breathing problems (Asthma; obesity-hypoventilation syndrome; obstructive sleep apnea; chronic inflammatory airway disease; reactive airway disease; or shortness of breath) 4.    Chronic kidney disease 5.    Liver disease (nonalcoholic fatty liver disease) 6.    High blood pressure 7.    Acid reflux (gastroesophageal reflux disease; heartburn) 8.    Osteoarthritis (OA) (with any of the following: hip pain; knee pain; and/or low back pain) 9.    Low back pain (Lumbar Facet Syndrome; and/or Degenerative Disc Disease) 10.  Hip pain (Osteoarthritis of hip) (For  every 1 lbs of added body weight, there is a 2 lbs increase in pressure inside of each hip articulation. 1:2 mechanical relationship) 11.  Knee pain (Osteoarthritis of knee) (For every 1 lbs of added body weight, there is a 4 lbs increase in pressure inside of each knee articulation. 1:4 mechanical relationship) (patients with a BMI>30 kg/m2 were 6.8 times more likely to develop knee OA than normal-weight individuals) 12.  Cancer: Epidemiological studies have shown that obesity is a risk factor for: post-menopausal breast cancer; cancers of the endometrium, colon and kidney cancer; malignant adenomas of the oesophagus. Obese subjects have an approximately 1.5-3.5-fold increased risk of developing these cancers compared with normal-weight subjects, and it has been estimated that between 15 and 45% of these cancers can be attributed to overweight. More recent studies suggest that obesity may also increase the risk of other types of cancer, including pancreatic, hepatic and gallbladder cancer. (Ref: Obesity and cancer. Pischon T, Nthlings U, [employer] H. Proc Nutr  Soc. 2008 May;67(2):128-45. doi: 36.4680/H2122482500370488.) The International Agency for Research on Cancer (IARC) has identified 13 cancers associated with overweight and obesity: meningioma, multiple myeloma, adenocarcinoma of the esophagus, and cancers of the thyroid, postmenopausal breast cancer, gallbladder, stomach, liver, pancreas, kidney, ovaries, uterus, colon and rectal (colorectal) cancers. 68 percent of all cancers diagnosed in women and 24 percent of those diagnosed in men are associated with overweight and obesity.  Recommendation: At this point it is urgent that you take a step back and concentrate in loosing weight. Dedicate 100% of your efforts on this task. Nothing else will improve your health more than bringing your weight down and your BMI to less than 30. If you are here, you probably have chronic pain. We know that most chronic pain  patients have difficulty exercising secondary to their pain. For this reason, you must rely on proper nutrition and diet in order to lose the weight. If your BMI is above 40, you should seriously consider bariatric surgery. A realistic goal is to lose 10% of your body weight over a period of 12 months.  Be honest to yourself, if over time you have unsuccessfully tried to lose weight, then it is time for you to seek professional help and to enter a medically supervised weight management program, and/or undergo bariatric surgery. Stop procrastinating.   Pain management considerations:  1.    Pharmacological Problems: Be advised that the use of opioid analgesics (oxycodone; hydrocodone; morphine; methadone; codeine; and all of their derivatives) have been associated with decreased metabolism and weight gain.  For this reason, should we see that you are unable to lose weight while taking these medications, it may become necessary for [place] to taper down and indefinitely discontinue them.  2.    Technical Problems: The incidence of successful interventional therapies decreases as the patient's BMI increases. It is much more difficult to accomplish a safe and effective interventional therapy on a patient with a BMI above 35. 3.    Radiation Exposure Problems: The x-rays machine, used to accomplish injection therapies, will automatically increase their x-ray output in order to capture an appropriate bone image. This means that radiation exposure increases exponentially with the patient's BMI. (The higher the BMI, the higher the radiation exposure.) Although the level of radiation used at a given time is still safe to the patient, it is not for the physician and/or assisting staff. Unfortunately, radiation exposure is accumulative. Because physicians and the staff have to do procedures and be exposed on a daily basis, this can result in health problems such as cancer and radiation burns. Radiation exposure to the staff is  monitored by the radiation batches that they wear. The exposure levels are reported back to the staff on a quarterly basis. Depending on levels of exposure, physicians and staff may be obligated by law to decrease this exposure. This means that they have the right and obligation to refuse providing therapies where they may be overexposed to radiation. For this reason, physicians may decline to offer therapies such as radiofrequency ablation or implants to patients with a BMI above 40. 4.    Current Trends: Be advised that the current trend is to no longer offer certain therapies to patients with a BMI equal to, or above 35, due to increase perioperative risks, increased technical procedural difficulties, and excessive radiation exposure to healthcare personnel.  ______________________________________________________________________________________________    ____________________________________________________________________________________________  Virtual Visits   What is a "Virtual Visit"? It is a Metallurgist (medical visit)  that takes place on real time (NOT TEXT or E-MAIL) over the telephone or computer device (desktop, laptop, tablet, smart phone, etc.). It allows for more location flexibility between the patient and the healthcare provider.  Who decides when these types of visits will be used? The physician.  Who is eligible for these types of visits? Only those patients that can be reliably reached over the telephone.  What do you mean by reliably? We do not have time to call everyone multiple times, therefore those that tend to screen calls and then call back later are not suitable candidates for this system. We understand how people are reluctant to pickup on "unknown" calls, therefore, we suggest adding our telephone numbers to your list of "CONTACT(s)". This way, you should be able to readily identify our calls when you receive one. All of our numbers are  available below.   Who is not eligible? This option is not available for medication management encounters, specially for controlled substances. Patients on pain medications that fall under the category of controlled substances have to come in for "Face-to-Face" encounters. This is required for mandatory monitoring of these substances. You may be asked to provide a sample for an unannounced urine drug screening test (UDS), and we will need to count your pain pills. Not bringing your pills to be counted may result in no refill. Obviously, neither one of these can be done over the phone.  When will this type of visits be used? You can request a virtual visit whenever you are physically unable to attend a regular appointment. The decision will be made by the physician (or healthcare provider) on a case by case basis.   At what time will I be called? This is an excellent question. The providers will try to call you whenever they have time available. Do not expect to be called at any specific time. The secretaries will assign you a time for your virtual visit appointment, but this is done simply to keep a list of those patients that need to be called, but not for the purpose of keeping a time schedule. Be advised that the call may come in anytime during the day, between the hours of 8:00 AM and 8::00 PM, depending on provider availability. We do understand that the system is not perfect. If you are unable to be available that day on a moments notice, then request an "in-person" appointment rather than a "virtual visit".  Can I request my medication visits to be "Virtual"? Yes you may request it, but the decision is entirely up to the healthcare provider. Control substances require specific monitoring that requires Face-to-Face encounters. The number of encounters  and the extent of the monitoring is determined on a case by case basis.  Add a new contact to your smart phone and label it "PAIN CLINIC" Under  this contact add the following numbers: Main: [phone] (Official Contact Number) Nurses: [phone] (These are outgoing only calling systems. Do not call this number.) Dr. Dossie Arbour: [phone] or [phone] (Outgoing calls only. Do not call this number.)  ____________________________________________________________________________________________

## 2020-06-15 NOTE — Progress Notes (Signed)
Safety precautions to be maintained throughout the outpatient stay will include: orient to surroundings, keep bed in low position, maintain call bell within reach at all times, provide assistance with transfer out of bed and ambulation.   Wt was taken 260lb

## 2020-06-17 DIAGNOSIS — G309 Alzheimer's disease, unspecified: Secondary | ICD-10-CM | POA: Insufficient documentation

## 2020-06-17 DIAGNOSIS — F0150 Vascular dementia without behavioral disturbance: Secondary | ICD-10-CM | POA: Insufficient documentation

## 2020-06-19 LAB — 25-HYDROXY VITAMIN D LCMS D2+D3
25-Hydroxy, Vitamin D-2: <1 ng/mL
25-Hydroxy, Vitamin D-3: 43 ng/mL
25-Hydroxy, Vitamin D: 44 ng/mL

## 2020-06-22 LAB — COMPLIANCE DRUG ANALYSIS, UR

## 2020-06-29 DIAGNOSIS — R053 Chronic cough: Secondary | ICD-10-CM | POA: Diagnosis not present

## 2020-06-29 DIAGNOSIS — R1310 Dysphagia, unspecified: Secondary | ICD-10-CM | POA: Diagnosis not present

## 2020-07-03 ENCOUNTER — Other Ambulatory Visit: Payer: Self-pay

## 2020-07-03 ENCOUNTER — Emergency Department
Admission: EM | Admit: 2020-07-03 | Discharge: 2020-07-03 | Disposition: A | Discharge status: Home / Self Care | Payer: Medicare HMO | Attending: Student in an Organized Health Care Education/Training Program | Admitting: Student in an Organized Health Care Education/Training Program

## 2020-07-03 ENCOUNTER — Emergency Department: Payer: Medicare HMO

## 2020-07-03 DIAGNOSIS — N189 Chronic kidney disease, unspecified: Secondary | ICD-10-CM | POA: Insufficient documentation

## 2020-07-03 DIAGNOSIS — M25562 Pain in left knee: Secondary | ICD-10-CM | POA: Diagnosis not present

## 2020-07-03 DIAGNOSIS — Z87891 Personal history of nicotine dependence: Secondary | ICD-10-CM | POA: Insufficient documentation

## 2020-07-03 DIAGNOSIS — E1140 Type 2 diabetes mellitus with diabetic neuropathy, unspecified: Secondary | ICD-10-CM | POA: Insufficient documentation

## 2020-07-03 DIAGNOSIS — I129 Hypertensive chronic kidney disease with stage 1 through stage 4 chronic kidney disease, or unspecified chronic kidney disease: Secondary | ICD-10-CM | POA: Diagnosis not present

## 2020-07-03 DIAGNOSIS — I2510 Atherosclerotic heart disease of native coronary artery without angina pectoris: Secondary | ICD-10-CM | POA: Insufficient documentation

## 2020-07-03 DIAGNOSIS — E785 Hyperlipidemia, unspecified: Secondary | ICD-10-CM | POA: Insufficient documentation

## 2020-07-03 DIAGNOSIS — Z7902 Long term (current) use of antithrombotics/antiplatelets: Secondary | ICD-10-CM | POA: Insufficient documentation

## 2020-07-03 DIAGNOSIS — D631 Anemia in chronic kidney disease: Secondary | ICD-10-CM | POA: Insufficient documentation

## 2020-07-03 DIAGNOSIS — Z7984 Long term (current) use of oral hypoglycemic drugs: Secondary | ICD-10-CM | POA: Insufficient documentation

## 2020-07-03 DIAGNOSIS — Z7982 Long term (current) use of aspirin: Secondary | ICD-10-CM | POA: Diagnosis not present

## 2020-07-03 DIAGNOSIS — Z79899 Other long term (current) drug therapy: Secondary | ICD-10-CM | POA: Insufficient documentation

## 2020-07-03 DIAGNOSIS — E1169 Type 2 diabetes mellitus with other specified complication: Secondary | ICD-10-CM | POA: Insufficient documentation

## 2020-07-03 DIAGNOSIS — E1122 Type 2 diabetes mellitus with diabetic chronic kidney disease: Secondary | ICD-10-CM | POA: Diagnosis not present

## 2020-07-03 DIAGNOSIS — J449 Chronic obstructive pulmonary disease, unspecified: Secondary | ICD-10-CM | POA: Diagnosis not present

## 2020-07-03 NOTE — ED Provider Notes (Signed)
Emergency Medicine Provider Triage Evaluation Note  Taylor Austin , a 80 y.o. male  was evaluated in triage.  Pt complains of left knee pain x several months without trauma or known injury. States it hurts all over his knee, feels deep in the knee. Reports hx of back surgery. No falls.  Review of Systems  Positive: Left knee pain Negative: Current back pain, fevers  Physical Exam  Ht 5\' 6"  (1.676 m)   Wt 117.9 kg   BMI 41.97 kg/m  Gen:   Awake, no distress   Resp:  Normal effort  MSK:   Moves extremities without difficulty  Other:    Medical Decision Making  Medically screening exam initiated at 4:15 PM.  Appropriate orders placed.  Felipe Drone was informed that the remainder of the evaluation will be completed by another provider, this initial triage assessment does not replace that evaluation, and the importance of remaining in the ED until their evaluation is complete.     Marlana Salvage, PA 07/03/20 1616    Lucrezia Starch, MD 07/03/20 2232

## 2020-07-03 NOTE — ED Triage Notes (Signed)
Pt arrives to ER for L knee pain x 10 months. States went to stand up today and pain became worse. Has compression sleeve on L knee. Did NOT take any meds for pain today for his knee.

## 2020-07-03 NOTE — ED Notes (Signed)
Pt verbalized discharge paperwork and follow-up care.

## 2020-07-03 NOTE — ED Provider Notes (Signed)
[facility] Emergency Department Provider Note  ____________________________________________   Event Date/Time   First MD Initiated Contact with Patient 07/03/20 1628     (approximate)  I have reviewed the triage vital signs and the nursing notes.   HISTORY  Chief Complaint Knee Pain    HPI Taylor Austin is a 80 y.o. male presents emergency department with complaints of left knee pain.  Patient states he was sitting on a sofa and had severe pain when he went to stand.  Patient has history of chronic pain due to multiple back injuries.  He states he cannot take narcotics as he is allergic to Percocet and hydrocodone.  States he used a cane and a neoprene sleeve without any relief.  He denies numbness or tingling due to the knee pain.  Past Medical History:  Diagnosis Date   Allergy    Anemia    Arthritis    Back pain    Barrett esophagus    BPH (benign prostatic hyperplasia)    Chronic kidney disease    COPD (chronic obstructive pulmonary disease) (HCC)    Coronary artery disease    Depression    Diabetes mellitus without complication (HCC)    Diverticulitis    Dysphagia    Esophageal reflux    Esophageal reflux    GERD (gastroesophageal reflux disease)    Heart murmur    Hyperlipidemia    Hypersomnia    Hypertension    Kidney stones    Low testosterone    OAB (overactive bladder)    Obesity    Presence of dental bridge    2 - top   Rheumatic fever    Sleep apnea    BiPAP   Stroke ([place]) 01/2016   Vertigo    Vitamin B12 deficiency     Patient Active Problem List   Diagnosis Date Noted   Chronic pain syndrome 06/15/2020   Pharmacologic therapy 06/15/2020   Disorder of skeletal system 06/15/2020   Problems influencing health status 06/15/2020   Abnormal MRI, cervical spine (11/14/2019) 06/15/2020   Abnormal MRI, lumbar spine (11/14/2019) 06/15/2020   History of lumbar laminectomy 06/15/2020   History of fusion of cervical  spine 06/15/2020   DDD (degenerative disc disease), cervical 06/15/2020   DDD (degenerative disc disease), lumbosacral 06/15/2020   Lumbosacral facet arthropathy 06/15/2020   Cervical facet hypertrophy 06/15/2020   DJD (degenerative joint disease) of cervical spine 06/15/2020   Chronic anticoagulation (Plavix) 06/15/2020   Lumbar facet joint pain (Bilateral) 06/15/2020   History of CVA (cerebrovascular accident) 06/15/2020   Poor historian 06/15/2020   Chronic low back pain (1ry area of Pain) (Bilateral) (R>L) w/o sciatica 12/15/2019   Lumbar stenosis with neurogenic claudication 12/15/2019   Memory loss, short term 10/09/2019   Chronic obstructive pulmonary disease ([place]) 09/18/2018   Morbid obesity with BMI of 40.0-44.9, adult ([place]) 04/03/2018   Hyperlipidemia associated with type 2 diabetes mellitus ([place]) 02/05/2018   Gastroesophageal reflux disease without esophagitis 07/29/2017   Other dysphagia 07/29/2017   Acute cerebrovascular accident (CVA) ([place]) 01/30/2016   Chronic kidney disease 07/14/2014   Clinical depression 07/14/2014   Gait instability 05/05/2014   Arthritis of knee 07/03/2013   Arteriosclerosis of coronary artery 07/03/2013   Type 2 diabetes mellitus with diabetic neuropathy, without long-term current use of insulin ([place]) 07/03/2013   Benign essential HTN 07/03/2013   HLD (hyperlipidemia) 07/03/2013   Breath shortness 07/03/2013   Atherosclerotic heart disease of native coronary artery without angina  pectoris 07/03/2013   Sleep apnea 06/18/2013   History of Barrett's esophagus 01/08/2001    Past Surgical History:  Procedure Laterality Date   BACK SURGERY     CARDIAC CATHETERIZATION  08/11/2014   Procedure: Left Heart Cath and Coronary Angiography;  Surgeon: Teodoro Spray, MD;  Location: [facility];  Service: Cardiovascular;;   CARDIAC CATHETERIZATION N/A 11/17/2014   Procedure: Right Heart Cath;  Surgeon: Teodoro Spray, MD;  Location: [facility] [place];  Service: Cardiovascular;  Laterality: N/A;   CAROTID STENT     CATARACT EXTRACTION W/PHACO Left 04/03/2016   Procedure: CATARACT EXTRACTION PHACO AND INTRAOCULAR LENS PLACEMENT ([place]) left diabetic;  Surgeon: Eulogio Bear, MD;  Location: [place];  Service: Ophthalmology;  Laterality: Left;  diabetic - oral meds sleep apnea   CATARACT EXTRACTION W/PHACO Right 05/01/2016   Procedure: CATARACT EXTRACTION PHACO AND INTRAOCULAR LENS PLACEMENT ([place]) Right diabetic;  Surgeon: Eulogio Bear, MD;  Location: [place];  Service: Ophthalmology;  Laterality: Right;  Diabetic oral meds sleep apnea   CERVICAL SPINE SURGERY     COLONOSCOPY     COLONOSCOPY WITH PROPOFOL N/A 06/10/2015   Procedure: COLONOSCOPY WITH PROPOFOL;  Surgeon: Manya Silvas, MD;  Location: [facility] ENDOSCOPY;  Service: Endoscopy;  Laterality: N/A;   CORONARY ANGIOPLASTY     ESOPHAGOGASTRODUODENOSCOPY (EGD) WITH PROPOFOL N/A 06/28/2017   Procedure: ESOPHAGOGASTRODUODENOSCOPY (EGD) WITH PROPOFOL;  Surgeon: Manya Silvas, MD;  Location: [facility] ENDOSCOPY;  Service: Endoscopy;  Laterality: N/A;   ESOPHAGOGASTRODUODENOSCOPY ENDOSCOPY     EYE SURGERY     LUMBAR SPINE SURGERY     THORACIC SPINE SURGERY     TONSILLECTOMY      Prior to Admission medications   Medication Sig Start Date End Date Taking? Authorizing Provider  ACCU-CHEK AVIVA PLUS test strip USE STRIP TO CHECK GLUCOSE ONCE DAILY 06/19/17   [provider]  albuterol (PROVENTIL HFA;VENTOLIN HFA) 108 (90 Base) MCG/ACT inhaler Inhale into the lungs every 6 (six) hours as needed for wheezing or shortness of breath.    [provider]  aspirin EC 81 MG tablet Take 81 mg by mouth daily.    [provider]  atorvastatin (LIPITOR) 40 MG tablet TAKE 1 TABLET BY MOUTH ONCE DAILY 04/29/17   [provider]  calcium carbonate (OSCAL) 1500 (600 Ca) MG TABS tablet Take by mouth 2 (two) times daily with a meal.    [provider]  clopidogrel (PLAVIX) 75 MG tablet Take 1 tablet (75 mg total) by mouth daily. 02/01/16   Hillary Bow, MD  Cyanocobalamin (VITAMIN B-12 PO) Take by mouth.    [provider]  donepezil (ARICEPT) 10 MG tablet Take by mouth. 04/15/17 07/14/17  [provider]  DULoxetine (CYMBALTA) 60 MG capsule  08/14/18   [provider]  finasteride (PROSCAR) 5 MG tablet TAKE 1 TABLET EVERY DAY 03/18/20   Hollice Espy, MD  gabapentin (NEURONTIN) 100 MG capsule Take 100 mg by mouth 3 (three) times daily. 200mg  midday and 300mg  at night    [provider]  glimepiride (AMARYL) 4 MG tablet Take by mouth. 01/15/20 01/14/21  [provider]  glyBURIDE-metformin (GLUCOVANCE) 5-500 MG per tablet TAKE ONE TABLET BY MOUTH TWICE DAILY WITH MEALS 04/01/14   [provider]  INVOKANA 100 MG TABS tablet Take 100 mg by mouth daily. 04/16/20   [provider]  losartan (COZAAR) 100 MG tablet Take 100 mg by mouth daily.  [provider]  magnesium oxide (MAG-OX) 400 MG tablet Take 400 mg by mouth 2 (two) times daily.     [provider]  metFORMIN (GLUCOPHAGE-XR) 500 MG 24 hr tablet Take 2 tablets by mouth 2 (two) times daily. 11/04/19 11/03/20  [provider]  Multiple Vitamin (MULTI-VITAMINS) TABS Take 1 tablet by mouth daily.     [provider]  oxymetazoline (AFRIN) 0.05 % nasal spray Place 1 spray into both nostrils 2 (two) times daily.    [provider]  pantoprazole (PROTONIX) 40 MG tablet Take by mouth. 04/10/17   [provider]  pioglitazone (ACTOS) 15 MG tablet TAKE 3 TABLETS ONE TIME DAILY 03/17/20   [provider]  Probiotic Product (PROBIOTIC PO) Take by mouth.    [provider]  sildenafil (REVATIO) 20 MG tablet 1-5 tablets as needed one hour prior to intercourse 04/26/20   Hollice Espy, MD  tamsulosin (FLOMAX) 0.4 MG CAPS capsule Take 1 capsule (0.4 mg total) by mouth  daily after supper. 01/30/18   Noreene Filbert, MD  umeclidinium-vilanterol (ANORO ELLIPTA) 62.5-25 MCG/INH AEPB Inhale 1 puff into the lungs daily.    [provider]    Allergies Hydrocodone and Oxycodone-acetaminophen  Family History  Problem Relation Age of Onset   Stroke Father    Anesthesia problems Father    Anesthesia problems Mother    Prostate cancer Brother    Kidney disease Neg Hx    Bladder Cancer Neg Hx     Social History Social History   Tobacco Use   Smoking status: Former    Pack years: 0.00    Types: Cigarettes    Quit date: 07/13/1973    Years since quitting: 47.0   Smokeless tobacco: Never   Tobacco comments:    reports he smoked 4 packs a day for 10 years and quit in 1975  Vaping Use   Vaping Use: Never used  Substance Use Topics   Alcohol use: Yes    Alcohol/week: 1.0 standard drink    Types: 1 Standard drinks or equivalent per week    Comment: rarely drinks, for holidays   Drug use: No    Review of Systems  Constitutional: No fever/chills Eyes: No visual changes. ENT: No sore throat. Respiratory: Denies cough Cardiovascular: Denies chest pain Gastrointestinal: Denies abdominal pain Genitourinary: Negative for dysuria. Musculoskeletal: Positive for chronic back pain. Skin: Negative for rash. Psychiatric: no mood changes,     ____________________________________________   PHYSICAL EXAM:  VITAL SIGNS: ED Triage Vitals  Enc Vitals Group     BP 07/03/20 1612 (!) 114/59     Pulse Rate 07/03/20 1612 84     Resp 07/03/20 1612 16     Temp 07/03/20 1612 97.9 F (36.6 C)     Temp Source 07/03/20 1612 Oral     SpO2 07/03/20 1612 97 %     Weight 07/03/20 1611 260 lb (117.9 kg)     Height 07/03/20 1611 5\' 6"  (1.676 m)     Head Circumference --      Peak Flow --      Pain Score 07/03/20 1611 5     Pain Loc --      Pain Edu? --      Excl. in [place]? --     Constitutional: Alert and oriented. Well appearing and in no acute  distress. Eyes: Conjunctivae are normal.  Head: Atraumatic. Nose: No congestion/rhinnorhea. Mouth/Throat: Mucous membranes are moist.   Neck:  supple no  lymphadenopathy noted Cardiovascular: Normal rate, regular rhythm.  Respiratory: Normal respiratory effort.  No retractions,  GU: deferred Musculoskeletal: FROM all extremities, warm and well perfused, left knee is tender at the joint line, neurovascular is intact Neurologic:  Normal speech and language.  Skin:  Skin is warm, dry and intact. No rash noted. Psychiatric: Mood and affect are normal. Speech and behavior are normal.  ____________________________________________   LABS (all labs ordered are listed, but only abnormal results are displayed)  Labs Reviewed - No data to display ____________________________________________   ____________________________________________  RADIOLOGY  X-ray of the left knee  ____________________________________________   PROCEDURES  Procedure(s) performed: Knee brace and walker applied by nursing staff   Procedures    ____________________________________________   INITIAL IMPRESSION / ASSESSMENT AND PLAN / ED COURSE  Pertinent labs & imaging results that were available during my care of the patient were reviewed by me and considered in my medical decision making (see chart for details).   Patient 80 year old male with knee pain.  See HPI.  Physical exam shows patient stable.  X-ray of the left knee reviewed by me and confirmed by radiology to show arthritic changes.  I did show the patient the x-rays and explained the x-rays to them.  Explained that we can put him in a knee brace and give him a walker as I feel this would be more effective than a cane.  I do not feel that he should be using crutches.  Since he is allergic to narcotics he can take over-the-counter Tylenol or ibuprofen.  Return emergency department worsening.  Apply ice.  Follow-up with his chronic pain doctor.  And  follow-up with orthopedics if not improving in 2 to 3 days.     Taylor Austin was evaluated in Emergency Department on 07/03/2020 for the symptoms described in the history of present illness. He was evaluated in the context of the global COVID-19 pandemic, which necessitated consideration that the patient might be at risk for infection with the SARS-CoV-2 virus that causes COVID-19. Institutional protocols and algorithms that pertain to the evaluation of patients at risk for COVID-19 are in a state of rapid change based on information released by regulatory bodies including the CDC and federal and state organizations. These policies and algorithms were followed during the patient's care in the ED.    As part of my medical decision making, I reviewed the following data within the [place] History obtained from family, Nursing notes reviewed and incorporated, Old chart reviewed, Radiograph reviewed , Notes from prior ED visits, and [place] Controlled Substance Database  ____________________________________________   FINAL CLINICAL IMPRESSION(S) / ED DIAGNOSES  Final diagnoses:  Acute pain of left knee      NEW MEDICATIONS STARTED DURING THIS VISIT:  New Prescriptions   No medications on file     Note:  This document was prepared using Dragon voice recognition software and may include unintentional dictation errors.    Versie Starks, PA-C 07/03/20 1850    Merlyn Lot, MD 07/03/20 [phone]

## 2020-07-04 DIAGNOSIS — G309 Alzheimer's disease, unspecified: Secondary | ICD-10-CM | POA: Diagnosis not present

## 2020-07-04 DIAGNOSIS — F0280 Dementia in other diseases classified elsewhere without behavioral disturbance: Secondary | ICD-10-CM | POA: Diagnosis not present

## 2020-07-04 DIAGNOSIS — F0150 Vascular dementia without behavioral disturbance: Secondary | ICD-10-CM | POA: Diagnosis not present

## 2020-07-20 ENCOUNTER — Other Ambulatory Visit: Payer: Self-pay | Admitting: Gastroenterology

## 2020-07-20 DIAGNOSIS — R1310 Dysphagia, unspecified: Secondary | ICD-10-CM

## 2020-07-20 DIAGNOSIS — R053 Chronic cough: Secondary | ICD-10-CM

## 2020-07-27 ENCOUNTER — Ambulatory Visit: Payer: Medicare HMO | Admitting: Pain Medicine

## 2020-07-27 ENCOUNTER — Other Ambulatory Visit: Payer: Self-pay | Admitting: Pain Medicine

## 2020-07-27 DIAGNOSIS — Z91199 Patient's noncompliance with other medical treatment and regimen due to unspecified reason: Secondary | ICD-10-CM

## 2020-07-27 DIAGNOSIS — Z5329 Procedure and treatment not carried out because of patient's decision for other reasons: Secondary | ICD-10-CM

## 2020-07-27 DIAGNOSIS — M47817 Spondylosis without myelopathy or radiculopathy, lumbosacral region: Secondary | ICD-10-CM | POA: Insufficient documentation

## 2020-07-27 NOTE — Progress Notes (Deleted)
No-show 07/27/2020

## 2020-07-27 NOTE — Progress Notes (Signed)
The patient did not show up to his appointment today (07/27/2020).   Review of initial evaluation: "The patient is a poor historian.  Thankfully his wife was present and very helpful.  He also seems to have great difficulty allowing for a 2 way communication take place.  For this reason, extracting information from this patient was very difficult and took a lot longer than it should have.  According to the patient the primary area of pain inside of the lower back (Bilateral) (R>L) he indicates having had 1 prior lumbar spine surgery that took place around September 2006 at the [facility] [place].  His surgeon appears to have been a Dr.Scheid (they did not know if the surgeon was a neurosurgeon or an Doctor, general practice).  They described the pain as being referred to both buttocks areas and having a "shooting" character to it (neuropathic).  According to them he had physical therapy but it did not help.  Apparently he started it twice but it was not completed.  The patient has a relatively recent MRI.   Interventional therapies: (by Girtha Hake, MD-PMR) 1.  Left L4 TFESI x1 (03/03/2020) 2.  Left L5 TFESI x2 (01/14/2020; 02/11/2020)   The patient secondary area of pain is that of the lower extremities (Bilateral) (R>L).  This is the part of the encounter that was very confusing since he admitted to having right lower extremity pain going all the way down to the top of the foot and what seems to be an L5 dermatomal distribution however, on the left side he started going back and forth and eventually lost his patience and indicated that he did not have any pain on the left lower extremity.  He admitted to having bilateral cramps in his calf that appears to victim primarily at bedtime.  He also describes being seen bilateral lower extremity muscle wasting and numbness, which again I cannot extract from him the location of this numbness.  Every time that I asked him a specific question he  would go back to saying that he is primary pain was in the lower back and buttocks.  I asked him whether or not he had any pain in the hips and the knees, but he did not provide me with information on this.   Physical exam: The patient had negative bilateral straight leg raise.  Positive pain in the lower lumbar region with Lumbar spine hyperextension.  Hyperextension and rotation maneuver as well as Lynelle Smoke maneuver was positive for bilateral lumbar facet arthropathy.  Provocative Patrick maneuver was negative bilaterally for hip or sacroiliac joint pain.   The patient also has multiple medical problems including having had a stroke around January 2008 and currently being on Plavix anticoagulation.  He is morbidly obese, COPD, with chronic kidney disease, gastroesophageal reflux disease, type 2 diabetes, hypertension, coronary artery disease, hyperlipidemia, shortness of breath, sleep apnea, and morbid obesity with a BMI of 43.27 kg/m."   Recent Diagnostic Imaging Review  Cervical Imaging: Cervical MR w/wo contrast: Results for orders placed during the hospital encounter of 11/13/19 MR CERVICAL SPINE W WO CONTRAST  Narrative CLINICAL DATA:  Initial evaluation for gait disturbance, balance difficulty. History of prior surgery and prostate cancer.  EXAM: MRI CERVICAL SPINE WITHOUT AND WITH CONTRAST  TECHNIQUE: Multiplanar and multiecho pulse sequences of the cervical spine, to include the craniocervical junction and cervicothoracic junction, were obtained without and with intravenous contrast.  CONTRAST:  75mL GADAVIST GADOBUTROL 1 MMOL/ML IV SOLN  COMPARISON:  None available.  FINDINGS: Alignment: Straightening of the normal cervical lordosis. Trace anterolisthesis of C3 on C4, chronic and facet mediated.  Vertebrae: Susceptibility artifact related to prior ACDF at C5-C7 with solid arthrodesis. Vertebral body height maintained without acute or chronic fracture. Bone marrow signal  intensity within normal limits. Subcentimeter benign hemangioma noted within the C2 vertebral body. No worrisome osseous lesions. No abnormal marrow edema or enhancement.  Cord: Normal signal and morphology.  Posterior Fossa, vertebral arteries, paraspinal tissues: Visualized brain and posterior fossa within normal limits. Craniocervical junction normal. Paraspinous and prevertebral soft tissues within normal limits. Normal intravascular flow voids seen within the vertebral arteries bilaterally.  Disc levels:  C2-C3: Negative interspace. Moderate right worse than left facet hypertrophy. No spinal stenosis. Mild bilateral C3 foraminal narrowing.  C3-C4: Mild disc bulge with uncovertebral hypertrophy. Moderate right worse than left facet hypertrophy. Mild ligamentum flavum redundancy. Mild spinal stenosis without cord deformity. Moderate left worse than right C4 foraminal stenosis.  C4-C5: Mild disc bulge with uncovertebral hypertrophy. Mild flattening and effacement of the ventral thecal sac. Moderate left with mild right facet hypertrophy. Mild spinal stenosis without cord deformity. Moderate left with mild right C5 foraminal stenosis.  C5-C6: Prior fusion. No residual spinal stenosis. Residual uncovertebral spurring with moderate left and mild to moderate right C6 foraminal narrowing.  C6-C7: Prior fusion. No residual spinal stenosis. Residual left-sided uncovertebral spurring with mild to moderate left C7 foraminal stenosis.  C7-T1: Minimal disc bulge. Bilateral facet hypertrophy. No significant canal stenosis. Foramina remain patent.  Visualized upper thoracic spine demonstrates no significant finding.  IMPRESSION: 1. Prior ACDF at C5-C7 without residual spinal stenosis. Persistent uncovertebral spurring with residual mild to moderate bilateral C6 and C7 foraminal stenosis as above. 2. Mild disc bulging with uncovertebral and facet hypertrophy at C3-4 and C4-5 with  resultant mild spinal stenosis but no cord deformity. Moderate left and mild right C4 and C5 foraminal narrowing. 3. Moderate multilevel facet arthrosis throughout the cervical spine as above, which could contribute to underlying neck pain.   Electronically Signed By: Jeannine Boga M.D. On: 11/14/2019 02:54  Lumbosacral Imaging: Lumbar MR wo contrast: Results for orders placed during the hospital encounter of 09/24/18 MR LUMBAR SPINE WO CONTRAST  Narrative CLINICAL DATA:  Chronic low back pain and stiffness with bilateral foot tingling and weakness. No recent injury.  EXAM: MRI LUMBAR SPINE WITHOUT CONTRAST  TECHNIQUE: Multiplanar, multisequence MR imaging of the lumbar spine was performed. No intravenous contrast was administered.  COMPARISON:  CT lumbar spine 08/21/2014.  FINDINGS: Segmentation:  Standard.  Alignment:  Maintained.  Vertebrae: No fracture, evidence of discitis, or worrisome bone lesion. Small Schmorl's node in the superior endplate of D40 and a small hemangioma in L1 are noted.  Conus medullaris and cauda equina: Conus extends to the L1 level. Conus and cauda equina appear normal.  Paraspinal and other soft tissues: Negative.  Disc levels:  T11-12 is imaged only in the sagittal plane and negative.  T12-L1: Mild facet degenerative disease.  Otherwise negative.  L1-2: Shallow disc bulge and mild ligament flavum thickening. There is mild central canal stenosis. The foramina are open.  L2-3: Shallow disc bulge with mild ligamentum flavum thickening and facet degenerative change. There is mild central canal and left foraminal narrowing. Right foramen open.  L3-4: Moderate ligamentum flavum thickening and facet degenerative disease. There is a disc bulge eccentrically prominent to the left with a more focal protrusion in foramen. Moderate to moderately severe central canal stenosis is  present with narrowing in the left subarticular recess  and foramen which could impact the exiting left L3 and descending left roots. Moderate right foraminal narrowing is also seen.  L4-5: Left laminotomy defect. Bilateral facet degenerative change is worse on the left. There is a shallow disc bulge to the central and foramina open.  L5-S1: Moderately severe bilateral facet degenerative change is worse on right. There is a shallow disc bulge without central canal stenosis. Mild to moderate foraminal narrowing is worse on right.  IMPRESSION: Spondylosis appears worst at L3-4 where moderate to moderately severe central canal stenosis is present. There is also marked left foraminal narrowing and narrowing in the left subarticular recess at this level which could impact both the exiting left L3 and descending left roots. Moderate right narrowing is present at this level.  Status post left laminotomy at L4-5. There is a shallow disc bulge to the left but no stenosis.  Multilevel facet degenerative disease is worst at L4-5 and S1.   Electronically Signed By: Inge Rise M.D. On: 09/24/2018 14:17  Lumbar MR w/wo contrast: Results for orders placed during the hospital encounter of 11/13/19 MR Lumbar Spine W Wo Contrast  Narrative CLINICAL DATA:  Chronic low back and bilateral leg pain.  EXAM: MRI LUMBAR SPINE WITHOUT AND WITH CONTRAST  TECHNIQUE: Multiplanar and multiecho pulse sequences of the lumbar spine were obtained without and with intravenous contrast.  CONTRAST:  75mL GADAVIST GADOBUTROL 1 MMOL/ML IV SOLN  COMPARISON:  MRI lumbar spine dated September 24, 2018.  FINDINGS: Segmentation:  Standard.  Alignment:  Unchanged trace anterolisthesis at L4-L5.  Vertebrae: No fracture, evidence of discitis, or bone lesion. Enlarging Schmorl's node involving the L3 inferior endplate. Unchanged L1 hemangioma.  Conus medullaris and cauda equina: Conus extends to the L1-L2 level. Conus and cauda equina appear normal. No  intradural enhancement.  Paraspinal and other soft tissues: Negative.  Disc levels:  T12-L1:  Negative.  L1-L2:  Negative.  L2-L3: Progressive mild disc bulging with prominent posterior epidural fat. Unchanged mild bilateral facet arthropathy. Progressive now moderate spinal canal stenosis. No neuroforaminal stenosis.  L3-L4: Unchanged diffuse disc bulging with superimposed left foraminal disc protrusion. Unchanged bilateral facet arthropathy. Unchanged moderate to severe spinal canal stenosis with severe left and moderate right neuroforaminal stenosis.  L4-L5: Prior left laminotomy. Unchanged shallow disc bulge eccentric to the left and mild bilateral facet arthropathy. No stenosis.  L5-S1: Negative disc. Unchanged moderate to severe bilateral facet arthropathy. Unchanged moderate bilateral neuroforaminal stenosis. No spinal canal stenosis.  IMPRESSION: 1. Multilevel degenerative changes of the lumbar spine as described above, progressed at L2-L3 with there is now moderate spinal canal stenosis. 2. Unchanged moderate to severe spinal canal stenosis and severe left neuroforaminal stenosis at L3-L4. 3. Unchanged moderate bilateral neuroforaminal stenosis at L5-S1.   Electronically Signed By: Titus Dubin M.D. On: 11/14/2019 16:30  Lumbar DG Bending views: Results for orders placed during the hospital encounter of 06/15/20 DG Lumbar Spine Complete W/Bend  Narrative CLINICAL DATA:  Low back pain.  EXAM: LUMBAR SPINE - COMPLETE WITH BENDING VIEWS  COMPARISON:  MRI November 13, 2019  FINDINGS: Trace retrolisthesis of L2 versus L3 on neutral, flexion, and extension imaging. No other malalignment. Lower lumbar facet degenerative changes. Multilevel degenerative disc disease, particularly in the lower thoracic spine and upper lumbar spine. Anterior wedging of L2 is not significantly changed since April 2016. Anterior wedging of T12 is unchanged since November 2021.  No other bony abnormalities.  Calcified atherosclerosis in  the abdominal aorta.  IMPRESSION: 1. Trace retrolisthesis L2-L2 versus L3 is stable. No other malalignment. 2. Degenerative changes as above. 3. Anterior wedging of T12 and L2 is not significantly changed since previous studies. 4. Calcified atherosclerosis in the abdominal aorta.   Electronically Signed By: Dorise Bullion III M.D On: 06/17/2020 17:04  Knee Imaging: Knee-L DG 4 views: Results for orders placed during the hospital encounter of 07/03/20 DG Knee Complete 4 Views Left  Narrative CLINICAL DATA:  Left knee pain.  EXAM: LEFT KNEE - COMPLETE 4+ VIEW  COMPARISON:  None.  FINDINGS: No fracture or dislocation. No joint effusion. Degenerative changes in the medial compartment are identified. There may be mild loss of joint space in the medial compartment. Minimal degenerative changes in the patellofemoral compartment.  IMPRESSION: 1. No acute abnormalities. 2. Degenerative changes as above, particularly in the medial and patellofemoral compartments.   Electronically Signed By: Dorise Bullion III M.D On: 07/03/2020 17:15  Complexity Note: Imaging results reviewed.         Interventional Therapies  Risk  Complexity Considerations:   Estimated body mass index is 41.97 kg/m as calculated from the following:   Height as of 07/03/20: 5\' 6"  (1.676 m).   Weight as of 07/03/20: 260 lb (117.9 kg). PLAVIX Anticoagulation (Stop: 7-10 days  Restart: 2 hours)   Planned  Pending:   Pending further evaluation   Under consideration:   Diagnostic bilateral lumbar facet block #1    Completed:   None at this time   Therapeutic  Palliative (PRN) options:   None established

## 2020-07-28 ENCOUNTER — Ambulatory Visit: Payer: Medicare HMO

## 2020-08-01 DIAGNOSIS — Z20822 Contact with and (suspected) exposure to covid-19: Secondary | ICD-10-CM | POA: Diagnosis not present

## 2020-08-06 DIAGNOSIS — Z20822 Contact with and (suspected) exposure to covid-19: Secondary | ICD-10-CM | POA: Diagnosis not present

## 2020-08-16 DIAGNOSIS — Z20822 Contact with and (suspected) exposure to covid-19: Secondary | ICD-10-CM | POA: Diagnosis not present

## 2020-08-29 ENCOUNTER — Ambulatory Visit
Admission: RE | Admit: 2020-08-29 | Discharge: 2020-08-29 | Disposition: A | Discharge status: Home / Self Care | Payer: Medicare HMO | Source: Ambulatory Visit | Attending: Gastroenterology | Admitting: Gastroenterology

## 2020-08-29 ENCOUNTER — Other Ambulatory Visit: Payer: Self-pay

## 2020-08-29 DIAGNOSIS — R1310 Dysphagia, unspecified: Secondary | ICD-10-CM | POA: Diagnosis not present

## 2020-08-29 DIAGNOSIS — R053 Chronic cough: Secondary | ICD-10-CM

## 2020-08-29 DIAGNOSIS — R059 Cough, unspecified: Secondary | ICD-10-CM | POA: Diagnosis not present

## 2020-08-29 NOTE — Therapy (Signed)
[place], [place], [address] Phone: [phone]   Fax:     Modified Barium Swallow  Patient Details  Name: Taylor Austin MRN: [id number] Date of Birth: 1940/10/22 No data recorded  Encounter Date: 08/29/2020   End of Session - 08/29/20 1801     Visit Number 1    Number of Visits 1    Date for SLP Re-Evaluation 08/29/20    SLP Start Time [id number]    SLP Stop Time  [id number]    SLP Time Calculation (min) 20 min    Activity Tolerance Patient tolerated treatment well              There were no vitals filed for this visit.   Subjective Assessment - 08/29/20 1752     Subjective Reports cough when drinking carbonated beverages, also occurs when not eating/drinking    Currently in Pain? No/denies             Objective Swallowing Evaluation: Type of Study: MBS-Modified Barium Swallow Study   Patient Details  Name: Taylor Austin MRN: [id number] Date of Birth: 11-Aug-1940  Today's Date: 08/29/2020 Time: SLP Start Time (ACUTE ONLY): [id number] -SLP Stop Time (ACUTE ONLY): [id number]  SLP Time Calculation (min) (ACUTE ONLY): 20 min   Past Medical History:  Past Medical History:  Diagnosis Date   Allergy    Anemia    Arthritis    Back pain    Barrett esophagus    BPH (benign prostatic hyperplasia)    Chronic kidney disease    COPD (chronic obstructive pulmonary disease) (HCC)    Coronary artery disease    Depression    Diabetes mellitus without complication (HCC)    Diverticulitis    Dysphagia    Esophageal reflux    Esophageal reflux    GERD (gastroesophageal reflux disease)    Heart murmur    Hyperlipidemia    Hypersomnia    Hypertension    Kidney stones    Low testosterone    OAB (overactive bladder)    Obesity    Presence of dental bridge    2 - top   Rheumatic fever    Sleep apnea    BiPAP   Stroke ([place]) 01/2016   Vertigo    Vitamin B12 deficiency    Past Surgical  History:  Past Surgical History:  Procedure Laterality Date   BACK SURGERY     CARDIAC CATHETERIZATION  08/11/2014   Procedure: Left Heart Cath and Coronary Angiography;  Surgeon: Teodoro Spray, MD;  Location: [facility];  Service: Cardiovascular;;   CARDIAC CATHETERIZATION N/A 11/17/2014   Procedure: Right Heart Cath;  Surgeon: Teodoro Spray, MD;  Location: [facility];  Service: Cardiovascular;  Laterality: N/A;   CAROTID STENT     CATARACT EXTRACTION W/PHACO Left 04/03/2016   Procedure: CATARACT EXTRACTION PHACO AND INTRAOCULAR LENS PLACEMENT ([place]) left diabetic;  Surgeon: Eulogio Bear, MD;  Location: [facility];  Service: Ophthalmology;  Laterality: Left;  diabetic - oral meds sleep apnea   CATARACT EXTRACTION W/PHACO Right 05/01/2016   Procedure: CATARACT EXTRACTION PHACO AND INTRAOCULAR LENS PLACEMENT ([place]) Right diabetic;  Surgeon: Eulogio Bear, MD;  Location: [facility];  Service: Ophthalmology;  Laterality: Right;  Diabetic oral meds sleep apnea   CERVICAL SPINE SURGERY     COLONOSCOPY     COLONOSCOPY WITH PROPOFOL N/A 06/10/2015   Procedure: COLONOSCOPY WITH PROPOFOL;  Surgeon: Manya Silvas, MD;  [facility] ENDOSCOPY;  Service: Endoscopy;  Laterality: N/A;   CORONARY ANGIOPLASTY     ESOPHAGOGASTRODUODENOSCOPY (EGD) WITH PROPOFOL N/A 06/28/2017   Procedure: ESOPHAGOGASTRODUODENOSCOPY (EGD) WITH PROPOFOL;  Surgeon: Manya Silvas, MD;  Location[facility] ENDOSCOPY;  Service: Endoscopy;  Laterality: N/A;   ESOPHAGOGASTRODUODENOSCOPY ENDOSCOPY     EYE SURGERY     LUMBAR SPINE SURGERY     THORACIC SPINE SURGERY     TONSILLECTOMY     HPI: Patient is a 80 y.o. male referred by GI for MBS due to coughing, throat clearing with liquids. Past medical history noted for Barrett's esophagus (2014), GERD, CKD, DM, COPD, HTN, HLD, CVA, memory loss. Imaging is noted for ACDF C5-7, uncovertebral spurring. During most recent EGD, dilated to 17 mm;  small hiatal hernia was noted.   No data recorded   Assessment / Plan / Recommendation  CHL IP CLINICAL IMPRESSIONS 08/29/2020  Clinical Impression Patient presents with functional oropharyngeal swallow. Oral stage is characterized by adequate lip closure, bolus preparation, and anterior to posterior transit. Swallow initiation is timely at the level of the base of tongue. Pharyngeal stage is noted for adequate tongue base retraction, hyolaryngeal excursion, and pharyngeal constriction. Epiglottic deflection is complete; there is no penetration or aspiration. Pharyngeal stripping wave is complete with no abnormal pharyngeal residue. Amplitude/duration of cricopharyngeus opening is WFL; prominent cricopharyngeus is noted, per the radiologist. Pt reported globus sensation in the pharynx during the exam; there was no contrast observed in the pharynx. Ability to perform esophageal sweep was limited by pt's body habitus, however there was adequate/complete clearance through the proximal esophagus. Consistencies tested were thin liquids x2 cup sips, nectar x1 cup sips, puree x2 heaping tsps, mechanical soft (1/4 graham cracker in applesauce x2), 3 oz sequential boluses thin liquid. Provided handout with this information regarding behavioral modifications for reflux given pt's history of esophageal dysphagia. Recommend patient continue regular diet with thin liquids; no further ST indicated at this time.  SLP Visit Diagnosis Dysphagia, unspecified (R13.10)  Attention and concentration deficit following --  Frontal lobe and executive function deficit following --  Impact on safety and function Mild aspiration risk      No flowsheet data found.   No flowsheet data found.  CHL IP DIET RECOMMENDATION 08/29/2020  SLP Diet Recommendations Regular solids;Thin liquid  Liquid Administration via Cup;Straw  Medication Administration Whole meds with liquid  Compensations Slow rate;Small sips/bites;Follow solids  with liquid  Postural Changes Remain semi-upright after after feeds/meals (Comment);Seated upright at 90 degrees      CHL IP OTHER RECOMMENDATIONS 08/29/2020  Recommended Consults Other (Comment)  Oral Care Recommendations Oral care BID  Other Recommendations --      No flowsheet data found.    No flowsheet data found.         CHL IP ORAL PHASE 08/29/2020  Oral Phase WFL  Oral - Pudding Teaspoon --  Oral - Pudding Cup --  Oral - Honey Teaspoon --  Oral - Honey Cup --  Oral - Nectar Teaspoon --  Oral - Nectar Cup --  Oral - Nectar Straw --  Oral - Thin Teaspoon --  Oral - Thin Cup --  Oral - Thin Straw --  Oral - Puree --  Oral - Mech Soft --  Oral - Regular --  Oral - Multi-Consistency --  Oral - Pill --  Oral Phase - Comment --    CHL IP PHARYNGEAL PHASE 08/29/2020  Pharyngeal Phase [facility]  Pharyngeal- Pudding Teaspoon --  Pharyngeal --  Pharyngeal- Pudding Cup --  Pharyngeal --  Pharyngeal- Honey Teaspoon --  Pharyngeal --  Pharyngeal- Honey Cup --  Pharyngeal --  Pharyngeal- Nectar Teaspoon --  Pharyngeal --  Pharyngeal- Nectar Cup --  Pharyngeal --  Pharyngeal- Nectar Straw --  Pharyngeal --  Pharyngeal- Thin Teaspoon --  Pharyngeal --  Pharyngeal- Thin Cup --  Pharyngeal --  Pharyngeal- Thin Straw --  Pharyngeal --  Pharyngeal- Puree --  Pharyngeal --  Pharyngeal- Mechanical Soft --  Pharyngeal --  Pharyngeal- Regular --  Pharyngeal --  Pharyngeal- Multi-consistency --  Pharyngeal --  Pharyngeal- Pill --  Pharyngeal --  Pharyngeal Comment --     CHL IP CERVICAL ESOPHAGEAL PHASE 08/29/2020  Cervical Esophageal Phase WFL  Pudding Teaspoon --  Pudding Cup --  Honey Teaspoon --  Honey Cup --  Nectar Teaspoon --  Nectar Cup --  Nectar Straw --  Thin Teaspoon --  Thin Cup --  Thin Straw --  Puree --  Mechanical Soft --  Regular --  Multi-consistency --  Pill --  Cervical Esophageal Comment --   Deneise Lever, MS,  CCC-SLP Speech-Language Pathologist   Aliene Altes 08/29/2020, 6:03 PM                                               Patient will benefit from skilled therapeutic intervention in order to improve the following deficits and impairments:   Dysphagia, unspecified type - Plan: DG SWALLOW FUNC OP MEDICARE SPEECH PATH, DG SWALLOW FUNC OP MEDICARE SPEECH PATH  Chronic cough - Plan: DG SWALLOW FUNC OP MEDICARE SPEECH PATH, DG SWALLOW FUNC OP MEDICARE SPEECH PATH        Problem List Patient Active Problem List   Diagnosis Date Noted   Hypomagnesemia 07/27/2020   Spondylosis without myelopathy or radiculopathy, lumbosacral region 07/27/2020   Mixed Alzheimer's and vascular dementia ([place]) 06/17/2020   Chronic pain syndrome 06/15/2020   Pharmacologic therapy 06/15/2020   Disorder of skeletal system 06/15/2020   Problems influencing health status 06/15/2020   Abnormal MRI, cervical spine (11/14/2019) 06/15/2020   Abnormal MRI, lumbar spine (11/14/2019) 06/15/2020   History of lumbar laminectomy 06/15/2020   History of fusion of cervical spine 06/15/2020   DDD (degenerative disc disease), cervical 06/15/2020   DDD (degenerative disc disease), lumbosacral 06/15/2020   Lumbosacral facet arthropathy 06/15/2020   Cervical facet hypertrophy 06/15/2020   DJD (degenerative joint disease) of cervical spine 06/15/2020   Chronic anticoagulation (Plavix) 06/15/2020   Lumbar facet joint pain (Bilateral) 06/15/2020   History of CVA (cerebrovascular accident) 06/15/2020   Poor historian 06/15/2020   Chronic low back pain (1ry area of Pain) (Bilateral) (R>L) w/o sciatica 12/15/2019   Lumbar stenosis with neurogenic claudication 12/15/2019   Memory loss, short term 10/09/2019   Chronic obstructive pulmonary disease ([place]) 09/18/2018   Morbid obesity with BMI of 40.0-44.9, adult ([place]) 04/03/2018   Hyperlipidemia associated with type 2 diabetes mellitus ([place]) 02/05/2018    Gastroesophageal reflux disease without esophagitis 07/29/2017   Other dysphagia 07/29/2017   Acute cerebrovascular accident (CVA) ([place]) 01/30/2016   Chronic kidney disease 07/14/2014   Clinical depression 07/14/2014   Gait instability 05/05/2014   Arthritis of knee 07/03/2013   Arteriosclerosis of coronary artery 07/03/2013   Type 2 diabetes mellitus with diabetic neuropathy, without long-term  current use of insulin ([place]) 07/03/2013   Benign essential HTN 07/03/2013   HLD (hyperlipidemia) 07/03/2013   Breath shortness 07/03/2013   Atherosclerotic heart disease of native coronary artery without angina pectoris 07/03/2013   Sleep apnea 06/18/2013   History of Barrett's esophagus 01/08/2001    Aliene Altes 08/29/2020, 6:02 PM  [facility] [place], [place], [address] Phone: [phone]   Fax:     Name: LANSING AGRAWAL MRN: [id number] Date of Birth: June 09, 1940

## 2020-08-30 DIAGNOSIS — Z20822 Contact with and (suspected) exposure to covid-19: Secondary | ICD-10-CM | POA: Diagnosis not present

## 2020-09-17 DIAGNOSIS — Z20822 Contact with and (suspected) exposure to covid-19: Secondary | ICD-10-CM | POA: Diagnosis not present

## 2020-09-20 DIAGNOSIS — E1159 Type 2 diabetes mellitus with other circulatory complications: Secondary | ICD-10-CM | POA: Diagnosis not present

## 2020-09-20 DIAGNOSIS — E669 Obesity, unspecified: Secondary | ICD-10-CM | POA: Diagnosis not present

## 2020-09-20 DIAGNOSIS — E1169 Type 2 diabetes mellitus with other specified complication: Secondary | ICD-10-CM | POA: Diagnosis not present

## 2020-09-20 DIAGNOSIS — E1140 Type 2 diabetes mellitus with diabetic neuropathy, unspecified: Secondary | ICD-10-CM | POA: Diagnosis not present

## 2020-09-20 DIAGNOSIS — I700 Atherosclerosis of aorta: Secondary | ICD-10-CM | POA: Insufficient documentation

## 2020-10-18 IMAGING — CR DG CHEST 2V
1 series · 2 of 2 positions shown · non-contrast
Comparison: 01/25/2015

CLINICAL DATA: Left rib pain

EXAM:
CHEST - 2 VIEW

[Series 1: dg chest 2 view · 0.14mm/px · 2 of 2 slices shown]
[im 1/2]
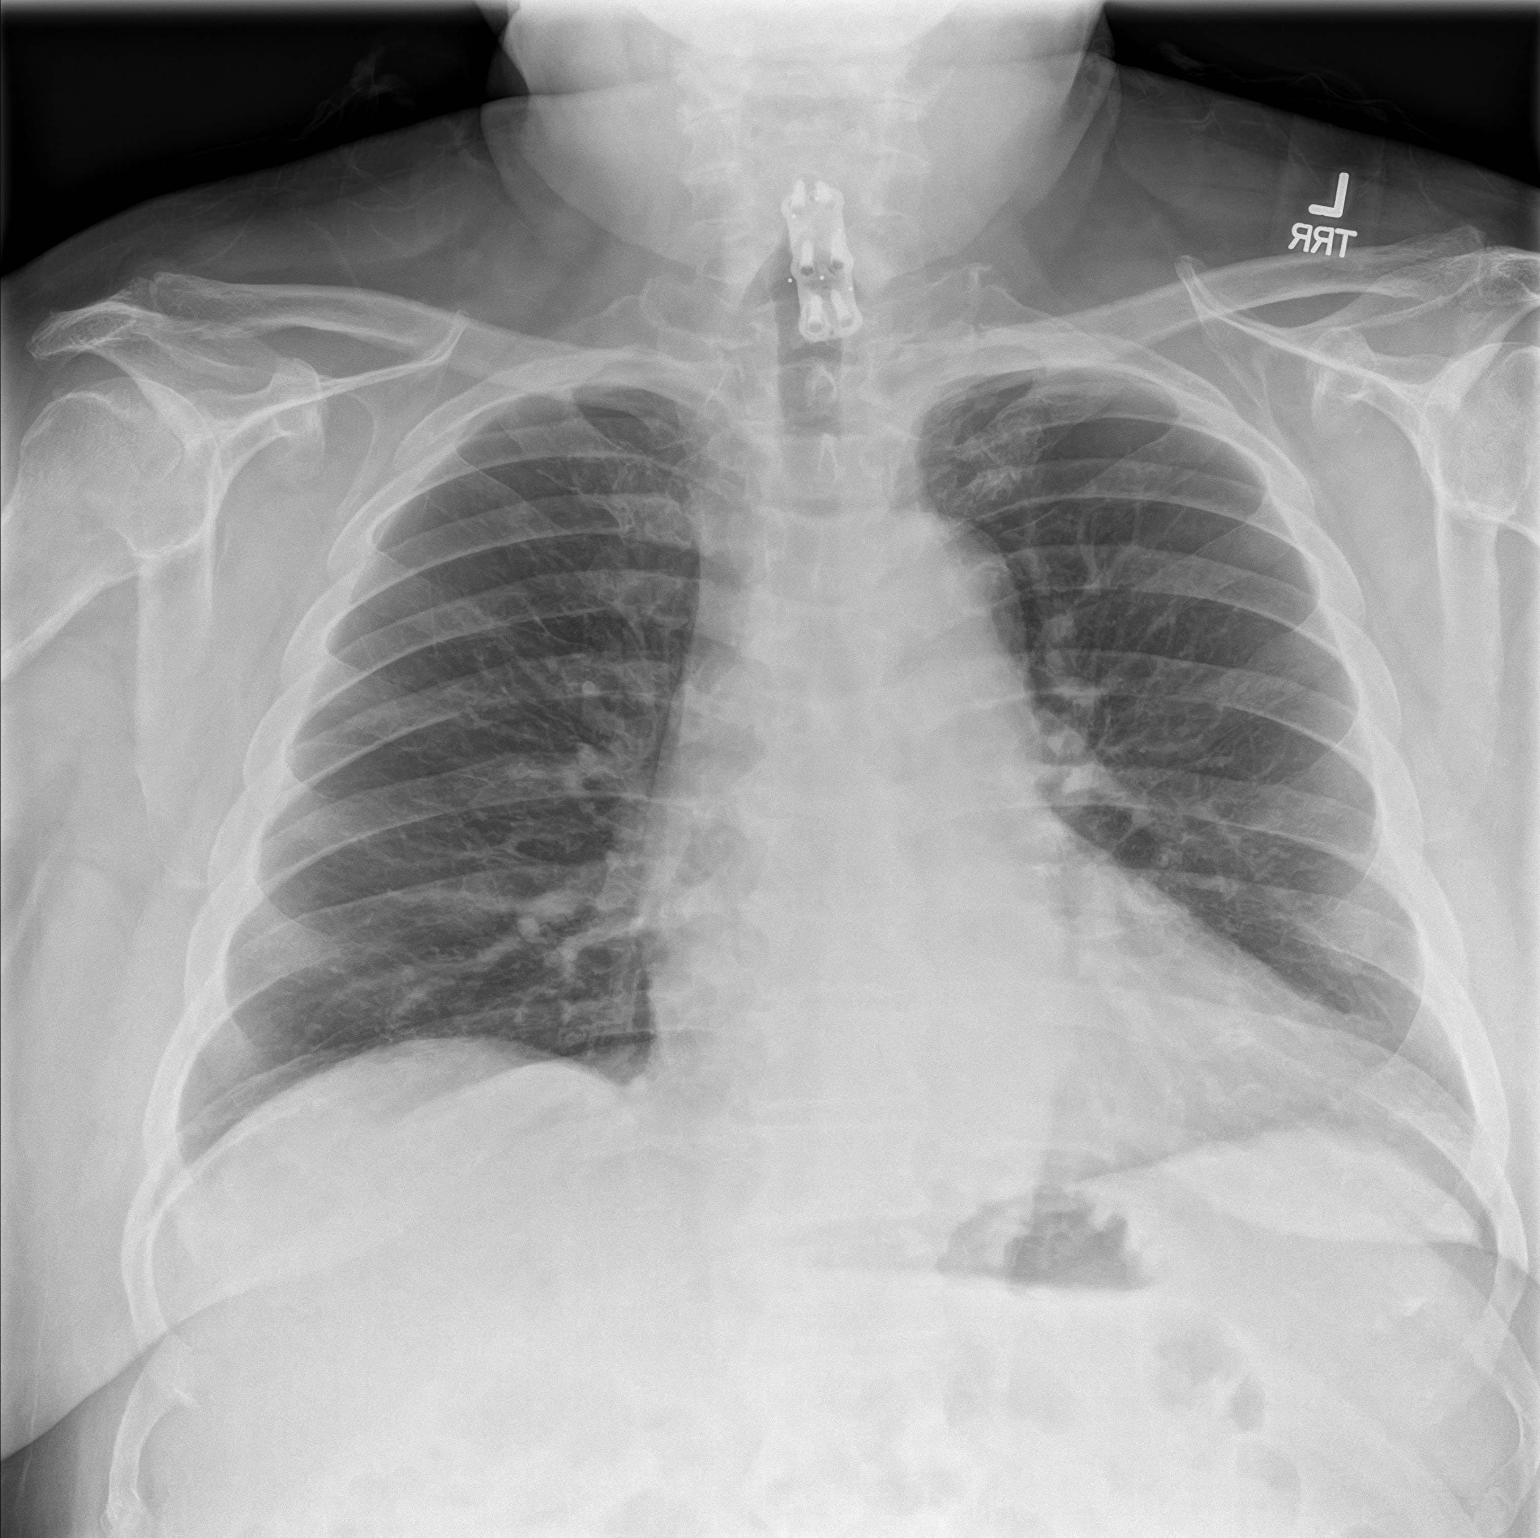
[im 2/2]
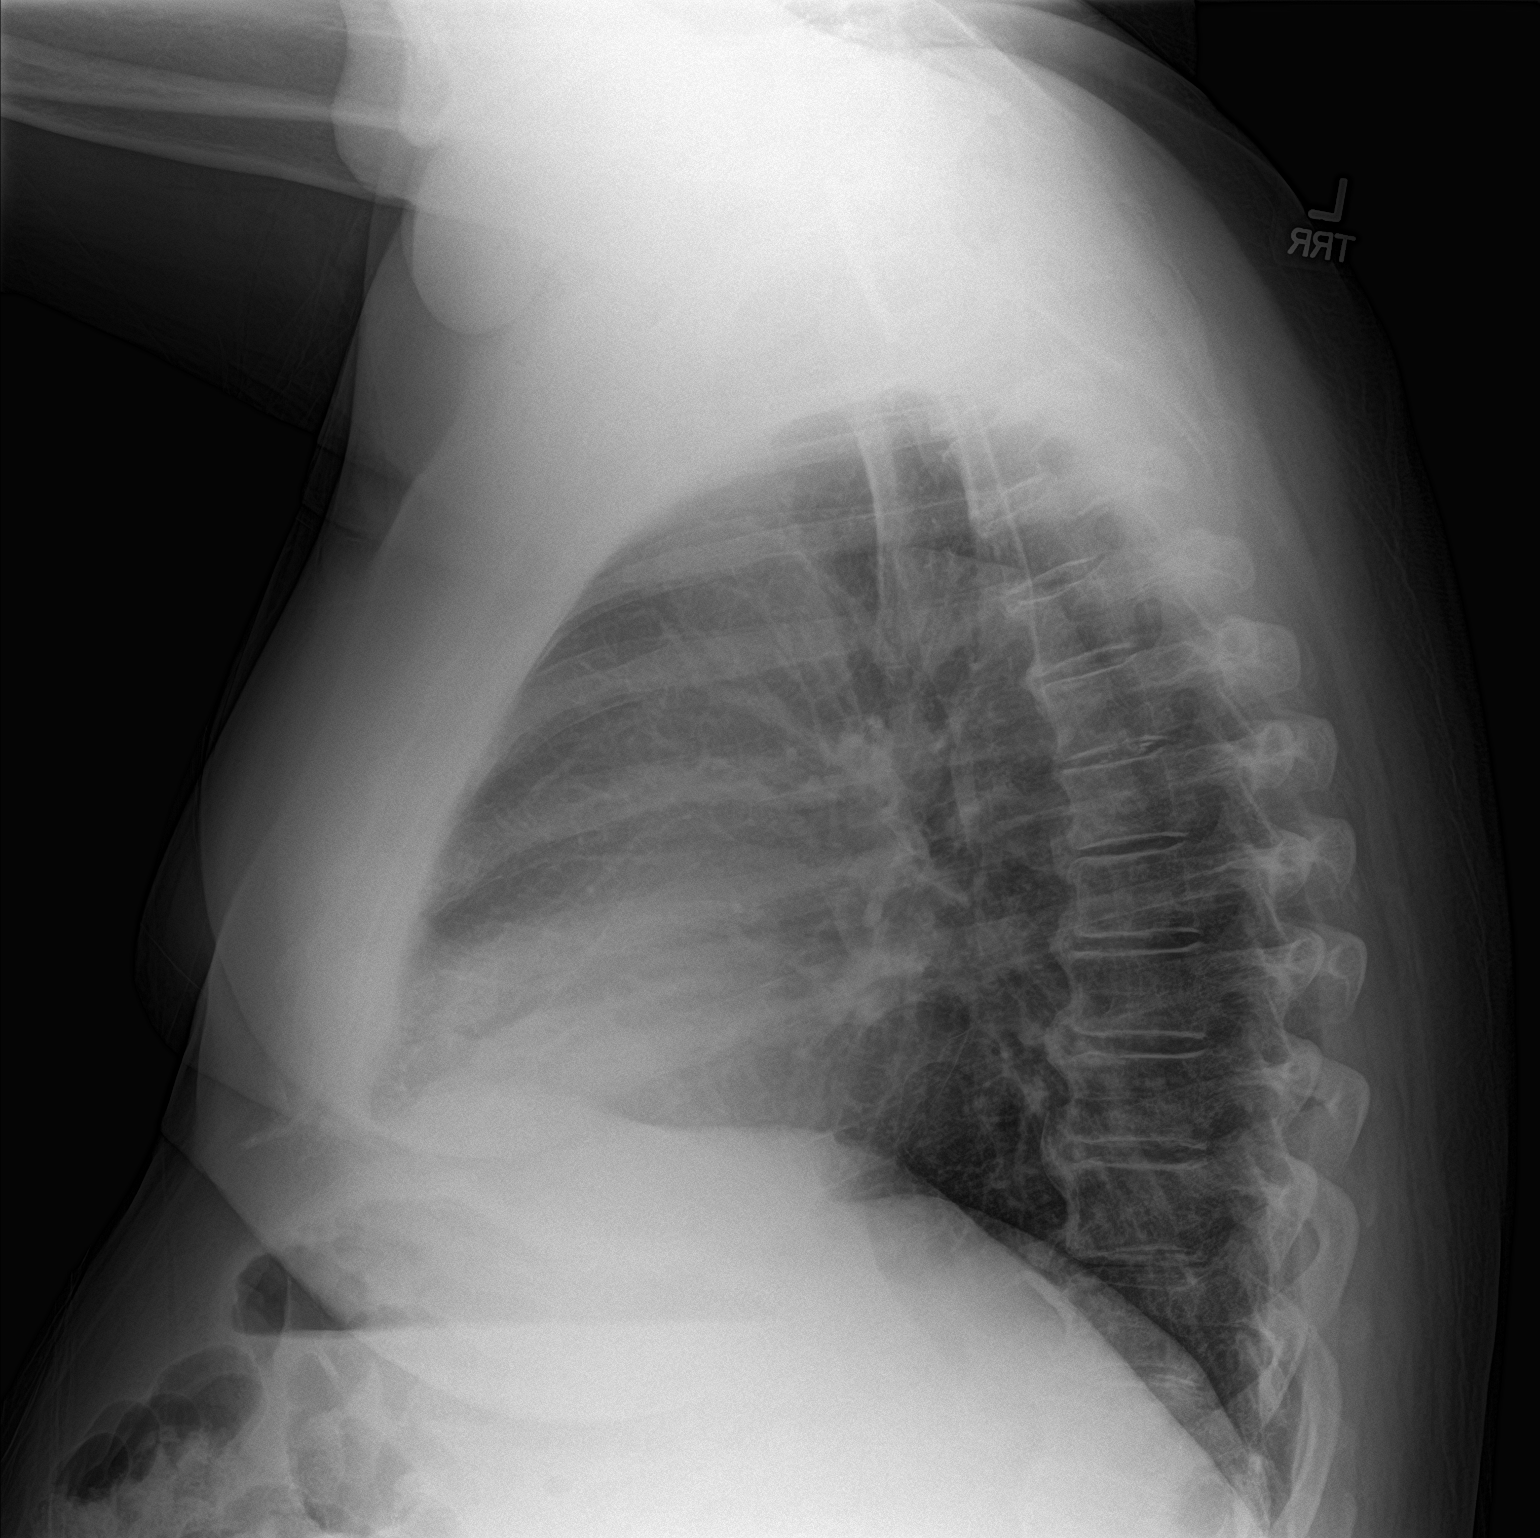

[2 of 2 positions shown; findings below may reference images not displayed]

FINDINGS: Heart is borderline in size. No confluent airspace opacities,
effusions or edema. No acute bony abnormality. Degenerative changes
in the thoracic spine.
IMPRESSION: No active cardiopulmonary disease.

## 2020-10-21 DIAGNOSIS — E785 Hyperlipidemia, unspecified: Secondary | ICD-10-CM | POA: Diagnosis not present

## 2020-10-21 DIAGNOSIS — I10 Essential (primary) hypertension: Secondary | ICD-10-CM | POA: Diagnosis not present

## 2020-10-21 DIAGNOSIS — E1140 Type 2 diabetes mellitus with diabetic neuropathy, unspecified: Secondary | ICD-10-CM | POA: Diagnosis not present

## 2020-10-21 DIAGNOSIS — N401 Enlarged prostate with lower urinary tract symptoms: Secondary | ICD-10-CM | POA: Diagnosis not present

## 2020-10-21 DIAGNOSIS — K219 Gastro-esophageal reflux disease without esophagitis: Secondary | ICD-10-CM | POA: Diagnosis not present

## 2020-10-21 DIAGNOSIS — M545 Low back pain, unspecified: Secondary | ICD-10-CM | POA: Diagnosis not present

## 2020-10-21 DIAGNOSIS — E1169 Type 2 diabetes mellitus with other specified complication: Secondary | ICD-10-CM | POA: Diagnosis not present

## 2020-10-21 DIAGNOSIS — R413 Other amnesia: Secondary | ICD-10-CM | POA: Diagnosis not present

## 2020-10-21 DIAGNOSIS — I2510 Atherosclerotic heart disease of native coronary artery without angina pectoris: Secondary | ICD-10-CM | POA: Diagnosis not present

## 2020-10-28 ENCOUNTER — Other Ambulatory Visit: Payer: Self-pay

## 2020-10-28 ENCOUNTER — Inpatient Hospital Stay: Payer: Medicare HMO | Attending: Radiation Oncology

## 2020-10-28 DIAGNOSIS — C61 Malignant neoplasm of prostate: Secondary | ICD-10-CM | POA: Insufficient documentation

## 2020-10-28 LAB — PSA: Prostatic Specific Antigen: 0.01 ng/mL (ref 0.00–4.00)

## 2020-10-31 NOTE — Progress Notes (Deleted)
PROVIDER NOTE: Information contained herein reflects review and annotations entered in association with encounter. Interpretation of such information and data should be left to medically-trained personnel. Information provided to patient can be located elsewhere in the medical record under "Patient Instructions". Document created using STT-dictation technology, any transcriptional errors that may result from process are unintentional.    Patient: Taylor Austin  Service Category: E/M  Provider: Gaspar Cola, MD  DOB: April 19, 1940  DOS: 11/02/2020  Specialty: Interventional Pain Management  MRN: [id number]  Setting: Ambulatory outpatient  PCP: Taylor Pitch, MD  Type: Established Patient    Referring Provider: Juluis Pitch, MD  Location: Office  Delivery: Face-to-face     Primary Reason(s) for Visit: Encounter for evaluation before starting new chronic pain management plan of care (Level of risk: moderate) CC: No chief complaint on file.  HPI  Taylor Austin is a 80 y.o. year old, male patient, who comes today for a follow-up evaluation to review the test results and decide on a treatment plan. He has Arthritis of knee; Chronic kidney disease; Arteriosclerosis of coronary artery; Clinical depression; Type 2 diabetes mellitus with diabetic neuropathy, without long-term current use of insulin ([place]); Benign essential HTN; Gait instability; HLD (hyperlipidemia); Breath shortness; Sleep apnea; Acute cerebrovascular accident (CVA) ([place]); Morbid obesity with BMI of 40.0-44.9, adult ([place]); Chronic low back pain (1ry area of Pain) (Bilateral) (R>L) w/o sciatica; Chronic obstructive pulmonary disease ([place]); Gastroesophageal reflux disease without esophagitis; History of Barrett's esophagus; Hyperlipidemia associated with type 2 diabetes mellitus ([place]); Lumbar stenosis with neurogenic claudication; Memory loss, short term; Other dysphagia; Atherosclerotic heart disease of native coronary artery without  angina pectoris; Chronic pain syndrome; Pharmacologic therapy; Disorder of skeletal system; Problems influencing health status; Abnormal MRI, cervical spine (11/14/2019); Abnormal MRI, lumbar spine (11/14/2019); History of lumbar laminectomy; History of fusion of cervical spine; DDD (degenerative disc disease), cervical; DDD (degenerative disc disease), lumbosacral; Lumbosacral facet arthropathy; Cervical facet hypertrophy; DJD (degenerative joint disease) of cervical spine; Chronic anticoagulation (Plavix); Lumbar facet joint pain (Bilateral); History of CVA (cerebrovascular accident); Poor historian; Mixed Alzheimer's and vascular dementia ([place]); Hypomagnesemia; and Spondylosis without myelopathy or radiculopathy, lumbosacral region on their problem list. His primarily concern today is the No chief complaint on file.  Pain Assessment: Location:     Radiating:   Onset:   Duration:   Quality:   Severity:  /10 (subjective, self-reported pain score)  Effect on ADL:   Timing:   Modifying factors:   BP:    HR:    Taylor Austin comes in today for a follow-up visit after his initial evaluation on 07/27/2020. Today we went over the results of his tests. These were explained in "[place]man's terms". During today's appointment we went over my diagnostic impression, as well as the proposed treatment plan.  ***  In considering the treatment plan options, Taylor Austin was reminded that I no longer take patients for medication management only. I asked him to let me know if he had no intention of taking advantage of the interventional therapies, so that we could make arrangements to provide this space to someone interested. I also made it clear that undergoing interventional therapies for the purpose of getting pain medications is very inappropriate on the part of a patient, and it will not be tolerated in this practice. This type of behavior would suggest true addiction and therefore it requires referral to an  addiction specialist.   Further details on both, my assessment(s), as well as the proposed treatment plan,  please see below.  Controlled Substance Pharmacotherapy Assessment REMS (Risk Evaluation and Mitigation Strategy)  Analgesic: None MME/day: 0 mg/day  Pill Count: None expected due to no prior prescriptions written by our practice. No notes on file Pharmacokinetics: Liberation and absorption (onset of action): WNL Distribution (time to peak effect): WNL Metabolism and excretion (duration of action): WNL         Pharmacodynamics: Desired effects: Analgesia: Taylor Austin reports >50% benefit. Functional ability: Patient reports that medication allows him to accomplish basic ADLs Clinically meaningful improvement in function (CMIF): Sustained CMIF goals met Perceived effectiveness: Described as relatively effective, allowing for increase in activities of daily living (ADL) Undesirable effects: Side-effects or Adverse reactions: None reported Monitoring: [place] PMP: PDMP reviewed during this encounter. Online review of the past 15-month period previously conducted. Not applicable at this point since we have not taken over the patient's medication management yet. List of other Serum/Urine Drug Screening Test(s):  No results found for: AMPHSCRSER, BARBSCRSER, BENZOSCRSER, COCAINSCRSER, COCAINSCRNUR, PCPSCRSER, THCSCRSER, THCU, CANNABQUANT, OPIATESCRSER, OXYSCRSER, [place], [place] List of all UDS test(s) done:  Lab Results  Component Value Date   SUMMARY Note 06/15/2020   Last UDS on record: Summary  Date Value Ref Range Status  06/15/2020 Note  Final    Comment:    ==================================================================== Compliance Drug Analysis, Ur ==================================================================== Test                             Result       Flag       Units  Drug Present and Declared for Prescription Verification   Gabapentin                      PRESENT      EXPECTED   Duloxetine                     PRESENT      EXPECTED  Drug Present not Declared for Prescription Verification   Acetaminophen                  PRESENT      UNEXPECTED   Ibuprofen                      PRESENT      UNEXPECTED   Diphenhydramine                PRESENT      UNEXPECTED   Lidocaine                      PRESENT      UNEXPECTED  Drug Absent but Declared for Prescription Verification   Salicylate                     Not Detected UNEXPECTED    Aspirin, as indicated in the declared medication list, is not always    detected even when used as directed.  ==================================================================== Test                      Result    Flag   Units      Ref Range   Creatinine              34               mg/dL      >=20 ==================================================================== Declared Medications:  The flagging and interpretation on this report are based on the  following declared medications.  Unexpected results may arise from  inaccuracies in the declared medications.   **Note: The testing scope of this panel includes these medications:   Duloxetine (Cymbalta)  Gabapentin (Neurontin)   **Note: The testing scope of this panel does not include small to  moderate amounts of these reported medications:   Aspirin   **Note: The testing scope of this panel does not include the  following reported medications:   Albuterol (Ventolin HFA)  Atorvastatin (Lipitor)  Calcium  Canagliflozin (Invokana)  Clopidogrel (Plavix)  Cyanocobalamin  Donepezil (Aricept)  Finasteride (Proscar)  Glimepiride (Amaryl)  Glyburide (Glucovance)  Losartan (Cozaar)  Magnesium (Mag-Ox)  Metformin (Glucovance)  Multivitamin  Oxymetazoline (Afrin)  Pantoprazole (Protonix)  Pioglitazone (Actos)  Probiotic  Sildenafil  Tamsulosin (Flomax)  Umeclidinium (Anoro)  Vilanterol  (Anoro) ==================================================================== For clinical consultation, please call [phone]. ====================================================================    UDS interpretation: No unexpected findings.          Medication Assessment Form: Patient introduced to form today Treatment compliance: Treatment may start today if patient agrees with proposed plan. Evaluation of compliance is not applicable at this point Risk Assessment Profile: Aberrant behavior: See initial evaluations. None observed or detected today Comorbid factors increasing risk of overdose: See initial evaluation. No additional risks detected today Opioid risk tool (ORT):  Opioid Risk  06/15/2020  Alcohol 3  Illegal Drugs 0  Rx Drugs 0  Alcohol 0  Illegal Drugs 0  Rx Drugs 0  Age between 16-45 years  0  History of Preadolescent Sexual Abuse 0  Psychological Disease 0  Depression 0  Opioid Risk Tool Scoring 3  Opioid Risk Interpretation Low Risk    ORT Scoring interpretation table:  Score <3 = Low Risk for SUD  Score between 4-7 = Moderate Risk for SUD  Score >8 = High Risk for Opioid Abuse   Risk of substance use disorder (SUD): Low  Risk Mitigation Strategies:  Patient opioid safety counseling: Completed today. Counseling provided to patient as per "Patient Counseling Document". Document signed by patient, attesting to counseling and understanding Patient-Prescriber Agreement (PPA): Obtained today.  Controlled substance notification to other providers: Written and sent today.  Pharmacologic Plan: Today we may be taking over the patient's pharmacological regimen. See below.             Laboratory Chemistry Profile   Renal Lab Results  Component Value Date   BUN 21 06/15/2020   CREATININE 0.92 06/15/2020   GFRAA >60 02/02/2016   GFRNONAA >60 06/15/2020   SPECGRAV 1.020 01/10/2017   PHUR 8.5 (H) 01/10/2017   PROTEINUR Negative 01/10/2017      Electrolytes Lab Results  Component Value Date   NA 137 06/15/2020   K 4.9 06/15/2020   CL 100 06/15/2020   CALCIUM 9.5 06/15/2020   MG 1.6 (L) 06/15/2020     Hepatic Lab Results  Component Value Date   AST 22 06/15/2020   ALT 21 06/15/2020   ALBUMIN 4.0 06/15/2020   ALKPHOS 48 06/15/2020     ID No results found for: LYMEIGGIGMAB, HIV, SARSCOV2NAA, STAPHAUREUS, MRSAPCR, HCVAB, PREGTESTUR, RMSFIGG, QFVRPH1IGG, QFVRPH2IGG, [facility]   Bone Lab Results  Component Value Date   25OHVITD1 44 06/15/2020   25OHVITD2 <1.0 06/15/2020   25OHVITD3 43 06/15/2020     Endocrine Lab Results  Component Value Date   GLUCOSE 98 06/15/2020   GLUCOSEU Negative 01/10/2017   HGBA1C 6.6 (H) 01/31/2016  TSH 1.949 02/01/2016     Neuropathy Lab Results  Component Value Date   VITAMINB12 644 06/15/2020   FOLATE 33.0 02/01/2016   HGBA1C 6.6 (H) 01/31/2016     CNS No results found for: COLORCSF, APPEARCSF, RBCCOUNTCSF, WBCCSF, POLYSCSF, LYMPHSCSF, EOSCSF, PROTEINCSF, GLUCCSF, JCVIRUS, CSFOLI, IGGCSF, LABACHR, ACETBL, LABACHR, ACETBL   Inflammation (CRP: Acute  ESR: Chronic) Lab Results  Component Value Date   CRP 0.6 06/15/2020   ESRSEDRATE 13 06/15/2020     Rheumatology No results found for: RF, ANA, LABURIC, URICUR, LYMEIGGIGMAB, LYMEABIGMQN, HLAB27   Coagulation Lab Results  Component Value Date   INR 0.90 01/30/2016   LABPROT 12.1 01/30/2016   APTT 30 01/30/2016   PLT 175 12/09/2017     Cardiovascular Lab Results  Component Value Date   TROPONINI <0.03 01/30/2016   HGB 12.2 (L) 12/09/2017   HCT 37.0 (L) 12/09/2017     Screening No results found for: SARSCOV2NAA, COVIDSOURCE, STAPHAUREUS, MRSAPCR, HCVAB, HIV, PREGTESTUR   Cancer No results found for: CEA, CA125, LABCA2   Allergens No results found for: ALMOND, APPLE, ASPARAGUS, AVOCADO, BANANA, BARLEY, BASIL, BAYLEAF, GREENBEAN, LIMABEAN, WHITEBEAN, BEEFIGE, REDBEET, BLUEBERRY, BROCCOLI, CABBAGE, MELON, CARROT,  CASEIN, CASHEWNUT, CAULIFLOWER, CELERY     Note: Lab results reviewed.  Recent Diagnostic Imaging Review  Cervical Imaging: Cervical MR wo contrast: No results found for this or any previous visit.  Cervical MR wo contrast: No valid procedures specified. Cervical MR w/wo contrast: Results for orders placed during the hospital encounter of 11/13/19  MR CERVICAL SPINE W WO CONTRAST  Narrative CLINICAL DATA:  Initial evaluation for gait disturbance, balance difficulty. History of prior surgery and prostate cancer.  EXAM: MRI CERVICAL SPINE WITHOUT AND WITH CONTRAST  TECHNIQUE: Multiplanar and multiecho pulse sequences of the cervical spine, to include the craniocervical junction and cervicothoracic junction, were obtained without and with intravenous contrast.  CONTRAST:  58mL GADAVIST GADOBUTROL 1 MMOL/ML IV SOLN  COMPARISON:  None available.  FINDINGS: Alignment: Straightening of the normal cervical lordosis. Trace anterolisthesis of C3 on C4, chronic and facet mediated.  Vertebrae: Susceptibility artifact related to prior ACDF at C5-C7 with solid arthrodesis. Vertebral body height maintained without acute or chronic fracture. Bone marrow signal intensity within normal limits. Subcentimeter benign hemangioma noted within the C2 vertebral body. No worrisome osseous lesions. No abnormal marrow edema or enhancement.  Cord: Normal signal and morphology.  Posterior Fossa, vertebral arteries, paraspinal tissues: Visualized brain and posterior fossa within normal limits. Craniocervical junction normal. Paraspinous and prevertebral soft tissues within normal limits. Normal intravascular flow voids seen within the vertebral arteries bilaterally.  Disc levels:  C2-C3: Negative interspace. Moderate right worse than left facet hypertrophy. No spinal stenosis. Mild bilateral C3 foraminal narrowing.  C3-C4: Mild disc bulge with uncovertebral hypertrophy. Moderate right worse  than left facet hypertrophy. Mild ligamentum flavum redundancy. Mild spinal stenosis without cord deformity. Moderate left worse than right C4 foraminal stenosis.  C4-C5: Mild disc bulge with uncovertebral hypertrophy. Mild flattening and effacement of the ventral thecal sac. Moderate left with mild right facet hypertrophy. Mild spinal stenosis without cord deformity. Moderate left with mild right C5 foraminal stenosis.  C5-C6: Prior fusion. No residual spinal stenosis. Residual uncovertebral spurring with moderate left and mild to moderate right C6 foraminal narrowing.  C6-C7: Prior fusion. No residual spinal stenosis. Residual left-sided uncovertebral spurring with mild to moderate left C7 foraminal stenosis.  C7-T1: Minimal disc bulge. Bilateral facet hypertrophy. No significant canal stenosis. Foramina remain patent.  Visualized upper thoracic  spine demonstrates no significant finding.  IMPRESSION: 1. Prior ACDF at C5-C7 without residual spinal stenosis. Persistent uncovertebral spurring with residual mild to moderate bilateral C6 and C7 foraminal stenosis as above. 2. Mild disc bulging with uncovertebral and facet hypertrophy at C3-4 and C4-5 with resultant mild spinal stenosis but no cord deformity. Moderate left and mild right C4 and C5 foraminal narrowing. 3. Moderate multilevel facet arthrosis throughout the cervical spine as above, which could contribute to underlying neck pain.   Electronically Signed By: Jeannine Boga M.D. On: 11/14/2019 02:54  Cervical CT wo contrast: No results found for this or any previous visit.  Cervical CT outside: No results found for this or any previous visit.  Cervical DG F/E views: No results found for this or any previous visit.  Cervical DG Bending/F/E views: No results found for this or any previous visit.   Shoulder Imaging: Shoulder-R MR w/wo contrast: No results found for this or any previous visit.  Shoulder-L MR  w/wo contrast: No results found for this or any previous visit.  Shoulder-R MR wo contrast: No results found for this or any previous visit.  Shoulder-L MR wo contrast: No results found for this or any previous visit.  Shoulder-R DG: No results found for this or any previous visit.  Shoulder-L DG: No results found for this or any previous visit.   Thoracic Imaging: Thoracic MR wo contrast: No results found for this or any previous visit.  Thoracic MR wo contrast: No valid procedures specified. Thoracic MR w/wo contrast: No results found for this or any previous visit.  Thoracic CT wo contrast: No results found for this or any previous visit.  Thoracic CT w/wo contrast: No results found for this or any previous visit.  Thoracic CT w/wo contrast: No results found for this or any previous visit.  Thoracic DG 4 views: No results found for this or any previous visit.  Thoracic DG: No results found for this or any previous visit.  Thoracic DG w/swimmers view: No results found for this or any previous visit.   Lumbosacral Imaging: Lumbar MR wo contrast: Results for orders placed during the hospital encounter of 09/24/18  MR LUMBAR SPINE WO CONTRAST  Narrative CLINICAL DATA:  Chronic low back pain and stiffness with bilateral foot tingling and weakness. No recent injury.  EXAM: MRI LUMBAR SPINE WITHOUT CONTRAST  TECHNIQUE: Multiplanar, multisequence MR imaging of the lumbar spine was performed. No intravenous contrast was administered.  COMPARISON:  CT lumbar spine 08/21/2014.  FINDINGS: Segmentation:  Standard.  Alignment:  Maintained.  Vertebrae: No fracture, evidence of discitis, or worrisome bone lesion. Small Schmorl's node in the superior endplate of Y60 and a small hemangioma in L1 are noted.  Conus medullaris and cauda equina: Conus extends to the L1 level. Conus and cauda equina appear normal.  Paraspinal and other soft tissues: Negative.  Disc  levels:  T11-12 is imaged only in the sagittal plane and negative.  T12-L1: Mild facet degenerative disease.  Otherwise negative.  L1-2: Shallow disc bulge and mild ligament flavum thickening. There is mild central canal stenosis. The foramina are open.  L2-3: Shallow disc bulge with mild ligamentum flavum thickening and facet degenerative change. There is mild central canal and left foraminal narrowing. Right foramen open.  L3-4: Moderate ligamentum flavum thickening and facet degenerative disease. There is a disc bulge eccentrically prominent to the left with a more focal protrusion in foramen. Moderate to moderately severe central canal stenosis is present with narrowing in  the left subarticular recess and foramen which could impact the exiting left L3 and descending left roots. Moderate right foraminal narrowing is also seen.  L4-5: Left laminotomy defect. Bilateral facet degenerative change is worse on the left. There is a shallow disc bulge to the central and foramina open.  L5-S1: Moderately severe bilateral facet degenerative change is worse on right. There is a shallow disc bulge without central canal stenosis. Mild to moderate foraminal narrowing is worse on right.  IMPRESSION: Spondylosis appears worst at L3-4 where moderate to moderately severe central canal stenosis is present. There is also marked left foraminal narrowing and narrowing in the left subarticular recess at this level which could impact both the exiting left L3 and descending left roots. Moderate right narrowing is present at this level.  Status post left laminotomy at L4-5. There is a shallow disc bulge to the left but no stenosis.  Multilevel facet degenerative disease is worst at L4-5 and S1.   Electronically Signed By: Drusilla Kanner M.D. On: 09/24/2018 14:17  Lumbar MR wo contrast: No valid procedures specified. Lumbar MR w/wo contrast: Results for orders placed during the hospital  encounter of 11/13/19  MR Lumbar Spine W Wo Contrast  Narrative CLINICAL DATA:  Chronic low back and bilateral leg pain.  EXAM: MRI LUMBAR SPINE WITHOUT AND WITH CONTRAST  TECHNIQUE: Multiplanar and multiecho pulse sequences of the lumbar spine were obtained without and with intravenous contrast.  CONTRAST:  53mL GADAVIST GADOBUTROL 1 MMOL/ML IV SOLN  COMPARISON:  MRI lumbar spine dated September 24, 2018.  FINDINGS: Segmentation:  Standard.  Alignment:  Unchanged trace anterolisthesis at L4-L5.  Vertebrae: No fracture, evidence of discitis, or bone lesion. Enlarging Schmorl's node involving the L3 inferior endplate. Unchanged L1 hemangioma.  Conus medullaris and cauda equina: Conus extends to the L1-L2 level. Conus and cauda equina appear normal. No intradural enhancement.  Paraspinal and other soft tissues: Negative.  Disc levels:  T12-L1:  Negative.  L1-L2:  Negative.  L2-L3: Progressive mild disc bulging with prominent posterior epidural fat. Unchanged mild bilateral facet arthropathy. Progressive now moderate spinal canal stenosis. No neuroforaminal stenosis.  L3-L4: Unchanged diffuse disc bulging with superimposed left foraminal disc protrusion. Unchanged bilateral facet arthropathy. Unchanged moderate to severe spinal canal stenosis with severe left and moderate right neuroforaminal stenosis.  L4-L5: Prior left laminotomy. Unchanged shallow disc bulge eccentric to the left and mild bilateral facet arthropathy. No stenosis.  L5-S1: Negative disc. Unchanged moderate to severe bilateral facet arthropathy. Unchanged moderate bilateral neuroforaminal stenosis. No spinal canal stenosis.  IMPRESSION: 1. Multilevel degenerative changes of the lumbar spine as described above, progressed at L2-L3 with there is now moderate spinal canal stenosis. 2. Unchanged moderate to severe spinal canal stenosis and severe left neuroforaminal stenosis at L3-L4. 3.  Unchanged moderate bilateral neuroforaminal stenosis at L5-S1.   Electronically Signed By: Obie Dredge M.D. On: 11/14/2019 16:30  Lumbar CT wo contrast: No results found for this or any previous visit.  Lumbar CT w/wo contrast: No results found for this or any previous visit.  Lumbar CT w/wo contrast: No results found for this or any previous visit.  Lumbar DG (Complete) 4+V: No results found for this or any previous visit.        Lumbar DG F/E views: No results found for this or any previous visit.        Lumbar DG Bending views: Results for orders placed during the hospital encounter of 06/15/20  DG Lumbar Spine Complete W/Bend  Narrative CLINICAL DATA:  Low back pain.  EXAM: LUMBAR SPINE - COMPLETE WITH BENDING VIEWS  COMPARISON:  MRI November 13, 2019  FINDINGS: Trace retrolisthesis of L2 versus L3 on neutral, flexion, and extension imaging. No other malalignment. Lower lumbar facet degenerative changes. Multilevel degenerative disc disease, particularly in the lower thoracic spine and upper lumbar spine. Anterior wedging of L2 is not significantly changed since April 2016. Anterior wedging of T12 is unchanged since November 2021. No other bony abnormalities.  Calcified atherosclerosis in the abdominal aorta.  IMPRESSION: 1. Trace retrolisthesis L2-L2 versus L3 is stable. No other malalignment. 2. Degenerative changes as above. 3. Anterior wedging of T12 and L2 is not significantly changed since previous studies. 4. Calcified atherosclerosis in the abdominal aorta.   Electronically Signed By: Dorise Bullion III M.D On: 06/17/2020 17:04        Lumbar DG Myelogram views: No results found for this or any previous visit.  Lumbar DG Myelogram: No results found for this or any previous visit.  Lumbar DG Myelogram: No results found for this or any previous visit.  Lumbar DG Myelogram: No results found for this or any previous visit.  Lumbar DG Myelogram  Lumbosacral: No results found for this or any previous visit.   Sacroiliac Joint Imaging: Sacroiliac Joint DG: No results found for this or any previous visit.  Sacroiliac Joint MR w/wo contrast: No results found for this or any previous visit.  Sacroiliac Joint MR wo contrast: No results found for this or any previous visit.   Spine Imaging: MRA Spinal Canal w/ cm: No results found for this or any previous visit.  MRA Spinal Canal wo/ cm: No valid procedures specified. MRA Spinal Canal w/wo cm: No results found for this or any previous visit.   Hip Imaging: Hip-R MR w/wo contrast: No results found for this or any previous visit.  Hip-L MR w/wo contrast: No results found for this or any previous visit.  Hip-R MR wo contrast: No results found for this or any previous visit.  Hip-L MR wo contrast: No results found for this or any previous visit.  Hip-R CT w/wo contrast: No results found for this or any previous visit.  Hip-L CT w/wo contrast: No results found for this or any previous visit.  Hip-R CT wo contrast: No results found for this or any previous visit.  Hip-L CT wo contrast: No results found for this or any previous visit.  Hip-R DG 2-3 views: No results found for this or any previous visit.  Hip-L DG 2-3 views: No results found for this or any previous visit.  Hip-B DG Bilateral: No results found for this or any previous visit.   Knee Imaging: Knee-R MR w/wo contrast: No results found for this or any previous visit.  Knee-L MR w/wo contrast: No results found for this or any previous visit.  Knee-R MR wo contrast: No results found for this or any previous visit.  Knee-L MR wo contrast: No results found for this or any previous visit.  Knee-R CT w/wo contrast: No results found for this or any previous visit.  Knee-L CT w/wo contrast: No results found for this or any previous visit.  Knee-R CT wo contrast: No results found for this or any previous  visit.  Knee-L CT wo contrast: No results found for this or any previous visit.  Knee-R DG 4 views: No results found for this or any previous visit.  Knee-L DG 4 views: Results for orders placed  during the hospital encounter of 07/03/20  DG Knee Complete 4 Views Left  Narrative CLINICAL DATA:  Left knee pain.  EXAM: LEFT KNEE - COMPLETE 4+ VIEW  COMPARISON:  None.  FINDINGS: No fracture or dislocation. No joint effusion. Degenerative changes in the medial compartment are identified. There may be mild loss of joint space in the medial compartment. Minimal degenerative changes in the patellofemoral compartment.  IMPRESSION: 1. No acute abnormalities. 2. Degenerative changes as above, particularly in the medial and patellofemoral compartments.   Electronically Signed By: Dorise Bullion III M.D On: 07/03/2020 17:15   Ankle Imaging: Ankle-R DG Complete: No results found for this or any previous visit.  Ankle-L DG Complete: No results found for this or any previous visit.   Foot Imaging: Foot-R DG Complete: No results found for this or any previous visit.  Foot-L DG Complete: No results found for this or any previous visit.   Elbow Imaging: Elbow-R DG Complete: No results found for this or any previous visit.  Elbow-L DG Complete: No results found for this or any previous visit.   Wrist Imaging: Wrist-R DG Complete: No results found for this or any previous visit.  Wrist-L DG Complete: No results found for this or any previous visit.   Hand Imaging: Hand-R DG Complete: No results found for this or any previous visit.  Hand-L DG Complete: No results found for this or any previous visit.   Complexity Note: Imaging results reviewed. Results shared with Mr. Mayorquin, using Layman's terms.                         Meds   Current Outpatient Medications:    ACCU-CHEK AVIVA PLUS test strip, USE STRIP TO CHECK GLUCOSE ONCE DAILY, Disp: , Rfl: 0   albuterol  (PROVENTIL HFA;VENTOLIN HFA) 108 (90 Base) MCG/ACT inhaler, Inhale into the lungs every 6 (six) hours as needed for wheezing or shortness of breath., Disp: , Rfl:    aspirin EC 81 MG tablet, Take 81 mg by mouth daily., Disp: , Rfl:    atorvastatin (LIPITOR) 40 MG tablet, TAKE 1 TABLET BY MOUTH ONCE DAILY, Disp: , Rfl:    calcium carbonate (OSCAL) 1500 (600 Ca) MG TABS tablet, Take by mouth 2 (two) times daily with a meal., Disp: , Rfl:    clopidogrel (PLAVIX) 75 MG tablet, Take 1 tablet (75 mg total) by mouth daily., Disp: 30 tablet, Rfl: 0   Cyanocobalamin (VITAMIN B-12 PO), Take by mouth., Disp: , Rfl:    donepezil (ARICEPT) 10 MG tablet, Take by mouth., Disp: , Rfl:    DULoxetine (CYMBALTA) 60 MG capsule, , Disp: , Rfl:    finasteride (PROSCAR) 5 MG tablet, TAKE 1 TABLET EVERY DAY, Disp: 90 tablet, Rfl: 3   gabapentin (NEURONTIN) 100 MG capsule, Take 100 mg by mouth 3 (three) times daily. $RemoveBefo'200mg'hQKKRMhexyE$  midday and $RemoveBe'300mg'KqgzYzskU$  at night, Disp: , Rfl:    glimepiride (AMARYL) 4 MG tablet, Take by mouth., Disp: , Rfl:    glyBURIDE-metformin (GLUCOVANCE) 5-500 MG per tablet, TAKE ONE TABLET BY MOUTH TWICE DAILY WITH MEALS, Disp: , Rfl:    INVOKANA 100 MG TABS tablet, Take 100 mg by mouth daily., Disp: , Rfl:    losartan (COZAAR) 100 MG tablet, Take 100 mg by mouth daily., Disp: , Rfl:    magnesium oxide (MAG-OX) 400 MG tablet, Take 400 mg by mouth 2 (two) times daily. , Disp: , Rfl:    metFORMIN (GLUCOPHAGE-XR) 500  MG 24 hr tablet, Take 2 tablets by mouth 2 (two) times daily., Disp: , Rfl:    Multiple Vitamin (MULTI-VITAMINS) TABS, Take 1 tablet by mouth daily. , Disp: , Rfl:    oxymetazoline (AFRIN) 0.05 % nasal spray, Place 1 spray into both nostrils 2 (two) times daily., Disp: , Rfl:    pantoprazole (PROTONIX) 40 MG tablet, Take by mouth., Disp: , Rfl:    pioglitazone (ACTOS) 15 MG tablet, TAKE 3 TABLETS ONE TIME DAILY, Disp: , Rfl:    Probiotic Product (PROBIOTIC PO), Take by mouth., Disp: , Rfl:     sildenafil (REVATIO) 20 MG tablet, 1-5 tablets as needed one hour prior to intercourse, Disp: 30 tablet, Rfl: 3   tamsulosin (FLOMAX) 0.4 MG CAPS capsule, Take 1 capsule (0.4 mg total) by mouth daily after supper., Disp: 90 capsule, Rfl: 3   umeclidinium-vilanterol (ANORO ELLIPTA) 62.5-25 MCG/INH AEPB, Inhale 1 puff into the lungs daily., Disp: , Rfl:   ROS  Constitutional: Denies any fever or chills Gastrointestinal: No reported hemesis, hematochezia, vomiting, or acute GI distress Musculoskeletal: Denies any acute onset joint swelling, redness, loss of ROM, or weakness Neurological: No reported episodes of acute onset apraxia, aphasia, dysarthria, agnosia, amnesia, paralysis, loss of coordination, or loss of consciousness  Allergies  Mr. Vanderwoude is allergic to hydrocodone and oxycodone-acetaminophen.  [place]  Drug: Mr. Stecher  reports no history of drug use. Alcohol:  reports current alcohol use of about 1.0 standard drink per week. Tobacco:  reports that he quit smoking about 47 years ago. His smoking use included cigarettes. He has never used smokeless tobacco. Medical:  has a past medical history of Allergy, Anemia, Arthritis, Back pain, Barrett esophagus, BPH (benign prostatic hyperplasia), Chronic kidney disease, COPD (chronic obstructive pulmonary disease) ([place]), Coronary artery disease, Depression, Diabetes mellitus without complication ([place]), Diverticulitis, Dysphagia, Esophageal reflux, Esophageal reflux, GERD (gastroesophageal reflux disease), Heart murmur, Hyperlipidemia, Hypersomnia, Hypertension, Kidney stones, Low testosterone, OAB (overactive bladder), Obesity, Presence of dental bridge, Rheumatic fever, Sleep apnea, Stroke ([place]) (01/2016), Vertigo, and Vitamin B12 deficiency. Surgical: Mr. Parcel  has a past surgical history that includes Lumbar spine surgery; Cervical spine surgery; Thoracic spine surgery; Colonoscopy; Esophagogastroduodenoscopy endoscopy; Carotid stent;  Back surgery; Tonsillectomy; Coronary angioplasty; Cardiac catheterization (08/11/2014); Cardiac catheterization (N/A, 11/17/2014); Colonoscopy with propofol (N/A, 06/10/2015); Cataract extraction w/PHACO (Left, 04/03/2016); Cataract extraction w/PHACO (Right, 05/01/2016); Eye surgery; and Esophagogastroduodenoscopy (egd) with propofol (N/A, 06/28/2017). Family: family history includes Anesthesia problems in his father and mother; Prostate cancer in his brother; Stroke in his father.  Constitutional Exam  General appearance: Well nourished, well developed, and well hydrated. In no apparent acute distress There were no vitals filed for this visit. BMI Assessment: Estimated body mass index is 41.97 kg/m as calculated from the following:   Height as of 07/03/20: $RemoveBef'5\' 6"'UQFMjyqHvU$  (1.676 m).   Weight as of 07/03/20: 260 lb (117.9 kg).  BMI interpretation table: BMI level Category Range association with higher incidence of chronic pain  <18 kg/m2 Underweight   18.5-24.9 kg/m2 Ideal body weight   25-29.9 kg/m2 Overweight Increased incidence by 20%  30-34.9 kg/m2 Obese (Class I) Increased incidence by 68%  35-39.9 kg/m2 Severe obesity (Class II) Increased incidence by 136%  >40 kg/m2 Extreme obesity (Class III) Increased incidence by 254%   Patient's current BMI Ideal Body weight  There is no height or weight on file to calculate BMI. Patient weight not recorded   BMI Readings from Last 4 Encounters:  07/03/20 41.97 kg/m  06/15/20 43.27  kg/m  04/26/20 43.27 kg/m  04/20/20 43.27 kg/m   Wt Readings from Last 4 Encounters:  07/03/20 260 lb (117.9 kg)  06/15/20 260 lb (117.9 kg)  04/26/20 260 lb (117.9 kg)  04/20/20 260 lb (117.9 kg)    Psych/Mental status: Alert, oriented x 3 (person, place, & time)       Eyes: PERLA Respiratory: No evidence of acute respiratory distress  Assessment & Plan  Primary Diagnosis & Pertinent Problem List: There were no encounter diagnoses.  Visit Diagnosis: No diagnosis  found. Problems updated and reviewed during this visit: No problems updated.  Plan of Care  Pharmacotherapy (Medications Ordered): No orders of the defined types were placed in this encounter.  Procedure Orders    No procedure(s) ordered today   Lab Orders  No laboratory test(s) ordered today   Imaging Orders  No imaging studies ordered today   Referral Orders  No referral(s) requested today    Pharmacological management options:  Opioid Analgesics: We'll take over management today. See above orders Membrane stabilizer: Options discussed, including a trial. Muscle relaxant: We have discussed the possibility of a trial NSAID: Trial discussed. Other analgesic(s): To be determined at a later time     {There is no content from the last Plan section.}   Provider-requested follow-up: No follow-ups on file. Recent Visits No visits were found meeting these conditions. Showing recent visits within past 90 days and meeting all other requirements Future Appointments Date Type Provider Dept  11/02/20 Appointment Milinda Pointer, MD [facility]  Showing future appointments within next 90 days and meeting all other requirements Primary Care Physician: Taylor Pitch, MD Note by: Taylor Cola, MD Date: 11/02/2020; Time: 11:17 AM

## 2020-10-31 NOTE — Progress Notes (Signed)
11/01/20 4:17 PM   Taylor Austin [birth year] [id number]  Referring provider:  Juluis Pitch, MD [phone] [place],  [place] [address] Chief Complaint  Patient presents with   Prostate Cancer     HPI: Taylor Austin is a 80 y.o.male with a personal history of prostate cancer, voiding dysfunction.OAB, who presents today for 6 month follow-up with PSA, PVR, and IPSS.  His surgical pathology is consistent with intermediate Gleason 4+3 prostate cancer. He is s/p IMRT in 12/2017.   Prostate biopsy on 08/13/2017 revealed 2 cores positive of disease, 2% of the left base described as 4+3 prostate cancer as well as Gleason 3+3 at the left mid involving only 2%.  There is also a suspicious lesion in the left apex but this was nondiagnostic.  On 02/18/2018 he completed 6 month Lupron depot.   His most recent PSA on 10/28/2020 remains undetectable.   He underwent cystoscopy 01/2017 with 3 to 4 cm which was somewhat obstructive without a significant median lobe or bladder pathology.  TRUS volume 29 cc.  Urodynamics in 12/2017 indicated dysfunctional voiding and OAB.   He is accompanied by his wife today. He reports today that he did not follow through with physical therapy.       IPSS     Row Name 11/01/20 1500         International Prostate Symptom Score   How often have you had the sensation of not emptying your bladder? Almost always     How often have you had to urinate less than every two hours? Almost always     How often have you found you stopped and started again several times when you urinated? Less than half the time     How often have you found it difficult to postpone urination? Almost always     How often have you had a weak urinary stream? Almost always     How often have you had to strain to start urination? Not at All     How many times did you typically get up at night to urinate? 3 Times     Total IPSS Score 25       Quality of Life due to urinary  symptoms   If you were to spend the rest of your life with your urinary condition just the way it is now how would you feel about that? Mostly Disatisfied              Score:  1-7 Mild 8-19 Moderate 20-35 Severe     PMH: Past Medical History:  Diagnosis Date   Allergy    Anemia    Arthritis    Back pain    Barrett esophagus    BPH (benign prostatic hyperplasia)    Chronic kidney disease    COPD (chronic obstructive pulmonary disease) (HCC)    Coronary artery disease    Depression    Diabetes mellitus without complication (HCC)    Diverticulitis    Dysphagia    Esophageal reflux    Esophageal reflux    GERD (gastroesophageal reflux disease)    Heart murmur    Hyperlipidemia    Hypersomnia    Hypertension    Kidney stones    Low testosterone    OAB (overactive bladder)    Obesity    Presence of dental bridge    2 - top   Rheumatic fever    Sleep apnea    BiPAP   Stroke (  [place]) 01/2016   Vertigo    Vitamin B12 deficiency     Surgical History: Past Surgical History:  Procedure Laterality Date   BACK SURGERY     CARDIAC CATHETERIZATION  08/11/2014   Procedure: Left Heart Cath and Coronary Angiography;  Surgeon: Teodoro Spray, MD;  Location: [facility];  Service: Cardiovascular;;   CARDIAC CATHETERIZATION N/A 11/17/2014   Procedure: Right Heart Cath;  Surgeon: Teodoro Spray, MD;  Location: [facility];  Service: Cardiovascular;  Laterality: N/A;   CAROTID STENT     CATARACT EXTRACTION W/PHACO Left 04/03/2016   Procedure: CATARACT EXTRACTION PHACO AND INTRAOCULAR LENS PLACEMENT ([place]) left diabetic;  Surgeon: Eulogio Bear, MD;  [facility];  Service: Ophthalmology;  Laterality: Left;  diabetic - oral meds sleep apnea   CATARACT EXTRACTION W/PHACO Right 05/01/2016   Procedure: CATARACT EXTRACTION PHACO AND INTRAOCULAR LENS PLACEMENT ([place]) Right diabetic;  Surgeon: Eulogio Bear, MD;  Location: [facility];   Service: Ophthalmology;  Laterality: Right;  Diabetic oral meds sleep apnea   CERVICAL SPINE SURGERY     COLONOSCOPY     COLONOSCOPY WITH PROPOFOL N/A 06/10/2015   Procedure: COLONOSCOPY WITH PROPOFOL;  Surgeon: Manya Silvas, MD;  Location: [facility];  Service: Endoscopy;  Laterality: N/A;   CORONARY ANGIOPLASTY     ESOPHAGOGASTRODUODENOSCOPY (EGD) WITH PROPOFOL N/A 06/28/2017   Procedure: ESOPHAGOGASTRODUODENOSCOPY (EGD) WITH PROPOFOL;  Surgeon: Manya Silvas, MD;  Location: [facility];  Service: Endoscopy;  Laterality: N/A;   ESOPHAGOGASTRODUODENOSCOPY ENDOSCOPY     EYE SURGERY     LUMBAR SPINE SURGERY     THORACIC SPINE SURGERY     TONSILLECTOMY      Home Medications:  Allergies as of 11/01/2020       Reactions   Hydrocodone Shortness Of Breath   Oxycodone-acetaminophen Shortness Of Breath        Medication List        Accurate as of November 01, 2020  4:17 PM. If you have any questions, ask your nurse or doctor.          Accu-Chek Aviva Plus test strip Generic drug: glucose blood USE STRIP TO CHECK GLUCOSE ONCE DAILY   albuterol 108 (90 Base) MCG/ACT inhaler Commonly known as: VENTOLIN HFA Inhale into the lungs every 6 (six) hours as needed for wheezing or shortness of breath.   aspirin EC 81 MG tablet Take 81 mg by mouth daily.   atorvastatin 40 MG tablet Commonly known as: LIPITOR TAKE 1 TABLET BY MOUTH ONCE DAILY   calcium carbonate 1500 (600 Ca) MG Tabs tablet Commonly known as: OSCAL Take by mouth 2 (two) times daily with a meal.   clopidogrel 75 MG tablet Commonly known as: PLAVIX Take 1 tablet (75 mg total) by mouth daily.   donepezil 10 MG tablet Commonly known as: ARICEPT Take by mouth.   DULoxetine 60 MG capsule Commonly known as: CYMBALTA   finasteride 5 MG tablet Commonly known as: PROSCAR TAKE 1 TABLET EVERY DAY   gabapentin 300 MG capsule Commonly known as: NEURONTIN What changed: Another medication with the same  name was removed. Continue taking this medication, and follow the directions you see here. Changed by: Hollice Espy, MD   glimepiride 4 MG tablet Commonly known as: AMARYL Take by mouth.   glyBURIDE-metformin 5-500 MG tablet Commonly known as: GLUCOVANCE TAKE ONE TABLET BY MOUTH TWICE DAILY WITH MEALS   Invokana 100 MG Tabs tablet Generic drug:  canagliflozin Take 100 mg by mouth daily.   losartan 100 MG tablet Commonly known as: COZAAR Take 100 mg by mouth daily.   magnesium oxide 400 MG tablet Commonly known as: MAG-OX Take 400 mg by mouth 2 (two) times daily.   metFORMIN 500 MG 24 hr tablet Commonly known as: GLUCOPHAGE-XR Take 2 tablets by mouth 2 (two) times daily.   Multi-Vitamins Tabs Take 1 tablet by mouth daily.   oxymetazoline 0.05 % nasal spray Commonly known as: AFRIN Place 1 spray into both nostrils 2 (two) times daily.   pantoprazole 40 MG tablet Commonly known as: PROTONIX Take by mouth.   pioglitazone 15 MG tablet Commonly known as: ACTOS TAKE 3 TABLETS ONE TIME DAILY   PROBIOTIC PO Take by mouth.   sildenafil 20 MG tablet Commonly known as: REVATIO 1-5 tablets as needed one hour prior to intercourse   tamsulosin 0.4 MG Caps capsule Commonly known as: FLOMAX Take 1 capsule (0.4 mg total) by mouth daily after supper.   umeclidinium-vilanterol 62.5-25 MCG/INH Aepb Commonly known as: ANORO ELLIPTA Inhale 1 puff into the lungs daily.   VITAMIN B-12 PO Take by mouth.        Allergies:  Allergies  Allergen Reactions   Hydrocodone Shortness Of Breath   Oxycodone-Acetaminophen Shortness Of Breath    Family History: Family History  Problem Relation Age of Onset   Stroke Father    Anesthesia problems Father    Anesthesia problems Mother    Prostate cancer Brother    Kidney disease Neg Hx    Bladder Cancer Neg Hx     Social History:  reports that he quit smoking about 47 years ago. His smoking use included cigarettes. He has  never used smokeless tobacco. He reports current alcohol use of about 1.0 standard drink per week. He reports that he does not use drugs.   Physical Exam: BP 100/68   Pulse 82   Ht 5\' 5"  (1.651 m)   Wt 260 lb (117.9 kg)   BMI 43.27 kg/m   Constitutional:  Alert and oriented, No acute distress. HEENT: [place] AT, moist mucus membranes.  Trachea midline, no masses. Cardiovascular: No clubbing, cyanosis, or edema. Respiratory: Normal respiratory effort, no increased work of breathing. Skin: No rashes, bruises or suspicious lesions. Neurologic: Grossly intact, no focal deficits, moving all 4 extremities. Psychiatric: Normal mood and affect.  Laboratory Data:  Lab Results  Component Value Date   CREATININE 0.92 06/15/2020    Results for orders placed or performed in visit on 10/28/20  PSA  Result Value Ref Range   Prostatic Specific Antigen 0.01 0.00 - 4.00 ng/mL   PVR 0  Assessment & Plan:    Prostate cancer ([place])  - Status post ADT x6 months with XRT - PSA has remained undetectable  - PSA; Future  - BLADDER SCAN AMB NON-IMAGING  2. OAB  - Severe urinary symptoms has failed maximal medical therapy previously failed all anticholinergics   - Urodynamics consistent with pelvic floor dysfunction  - Strongly recommend and encourage pelvic floor therapy and Kegel exercises. Recommend cystoscopy to rule out underlying issues and review anatomy of the bladder for worsening urinary symptoms.   - Discussed Botox injections into the bladder for OAB if therapy does not work.   Follow-up with cystoscopy   I,Kailey Littlejohn,acting as a scribe for Hollice Espy, MD.,have documented all relevant documentation on the behalf of Hollice Espy, MD,as directed by  Hollice Espy, MD while in the presence of [place]  Erlene Quan, MD.  I have reviewed the above documentation for accuracy and completeness, and I agree with the above.   Hollice Espy, MD  [facility] [address] [place], [place] [address] [phone]

## 2020-11-01 ENCOUNTER — Other Ambulatory Visit: Payer: Self-pay

## 2020-11-01 ENCOUNTER — Ambulatory Visit: Payer: Medicare HMO | Admitting: Urology

## 2020-11-01 VITALS — BP 100/68 | HR 82 | Ht 65.0 in | Wt 260.0 lb

## 2020-11-01 DIAGNOSIS — C61 Malignant neoplasm of prostate: Secondary | ICD-10-CM

## 2020-11-01 NOTE — Patient Instructions (Signed)

## 2020-11-02 ENCOUNTER — Ambulatory Visit: Payer: Medicare HMO | Admitting: Pain Medicine

## 2020-11-04 ENCOUNTER — Ambulatory Visit: Payer: Medicare HMO | Admitting: Pain Medicine

## 2020-11-04 ENCOUNTER — Ambulatory Visit
Admission: RE | Admit: 2020-11-04 | Discharge: 2020-11-04 | Disposition: A | Discharge status: Home / Self Care | Payer: Medicare HMO | Source: Ambulatory Visit | Attending: Radiation Oncology | Admitting: Radiation Oncology

## 2020-11-04 ENCOUNTER — Encounter: Payer: Self-pay | Admitting: Radiation Oncology

## 2020-11-04 ENCOUNTER — Other Ambulatory Visit: Payer: Self-pay

## 2020-11-04 VITALS — BP 135/55 | HR 64 | Temp 96.5°F | Wt 256.5 lb

## 2020-11-04 DIAGNOSIS — Z923 Personal history of irradiation: Secondary | ICD-10-CM | POA: Diagnosis not present

## 2020-11-04 DIAGNOSIS — C61 Malignant neoplasm of prostate: Secondary | ICD-10-CM | POA: Diagnosis not present

## 2020-11-04 DIAGNOSIS — R351 Nocturia: Secondary | ICD-10-CM | POA: Insufficient documentation

## 2020-11-04 DIAGNOSIS — Z08 Encounter for follow-up examination after completed treatment for malignant neoplasm: Secondary | ICD-10-CM | POA: Diagnosis not present

## 2020-11-04 NOTE — Progress Notes (Signed)
Radiation Oncology Follow up Note  Name: Taylor Austin   Date:   11/04/2020 MRN:  [id number] DOB: [birth year]    This 80 y.o. male presents to the clinic today for 3-1/2-year follow-up status post IMRT radiation therapy for Gleason 7 (4+3) adenocarcinoma the prostate stage IIa.  REFERRING PROVIDER: Juluis Pitch, MD  HPI: Patient is a 80 year old male now out 3-1/2 years having completed IMRT radiation therapy for Gleason 7 adenocarcinoma of his prostate.  Seen today in routine follow-up he is doing well continues to have.  Lower urinary tract symptoms including urgency frequency and nocturia.  He has been seen by Dr. Erlene Quan and his urodynamics are consistent with pelvic floor dysfunction.  Patient has been recommended for pelvic floor therapy and Kegel exercises.  He is also to undergo cystoscopy in the near future.  COMPLICATIONS OF TREATMENT: present  FOLLOW UP COMPLIANCE: keeps appointments   PHYSICAL EXAM:  BP (!) 135/55   Pulse 64   Temp (!) 96.5 F (35.8 C)   Wt 256 lb 8 oz (116.3 kg)   BMI 42.68 kg/m  Well-developed well-nourished patient in NAD. HEENT reveals PERLA, EOMI, discs not visualized.  Oral cavity is clear. No oral mucosal lesions are identified. Neck is clear without evidence of cervical or supraclavicular adenopathy. Lungs are clear to A&P. Cardiac examination is essentially unremarkable with regular rate and rhythm without murmur rub or thrill. Abdomen is benign with no organomegaly or masses noted. Motor sensory and DTR levels are equal and symmetric in the upper and lower extremities. Cranial nerves II through XII are grossly intact. Proprioception is intact. No peripheral adenopathy or edema is identified. No motor or sensory levels are noted. Crude visual fields are within normal range.  RADIOLOGY RESULTS: No current films for review  PLAN: Present time patient is under excellent biochemical control of his prostate cancer.  He continues close follow-up care  with urology for his urologic symptoms.  I have asked to see him back in 1 year for follow-up.  Patient knows to call with any concerns.  I would like to take this opportunity to thank you for allowing me to participate in the care of your patient.Noreene Filbert, MD

## 2020-11-07 NOTE — Progress Notes (Deleted)
No show

## 2020-11-08 ENCOUNTER — Telehealth: Payer: Self-pay

## 2020-11-09 ENCOUNTER — Ambulatory Visit (HOSPITAL_BASED_OUTPATIENT_CLINIC_OR_DEPARTMENT_OTHER): Payer: Medicare HMO | Admitting: Pain Medicine

## 2020-11-09 DIAGNOSIS — G894 Chronic pain syndrome: Secondary | ICD-10-CM

## 2020-11-19 ENCOUNTER — Other Ambulatory Visit: Payer: Self-pay

## 2020-11-19 ENCOUNTER — Emergency Department: Payer: Medicare HMO

## 2020-11-19 ENCOUNTER — Emergency Department
Admission: EM | Admit: 2020-11-19 | Discharge: 2020-11-19 | Disposition: A | Discharge status: Home / Self Care | Payer: Medicare HMO | Attending: Emergency Medicine | Admitting: Emergency Medicine

## 2020-11-19 DIAGNOSIS — W19XXXA Unspecified fall, initial encounter: Secondary | ICD-10-CM

## 2020-11-19 DIAGNOSIS — S32010A Wedge compression fracture of first lumbar vertebra, initial encounter for closed fracture: Secondary | ICD-10-CM | POA: Diagnosis not present

## 2020-11-19 DIAGNOSIS — Z7901 Long term (current) use of anticoagulants: Secondary | ICD-10-CM | POA: Diagnosis not present

## 2020-11-19 DIAGNOSIS — N189 Chronic kidney disease, unspecified: Secondary | ICD-10-CM | POA: Insufficient documentation

## 2020-11-19 DIAGNOSIS — S32009A Unspecified fracture of unspecified lumbar vertebra, initial encounter for closed fracture: Secondary | ICD-10-CM

## 2020-11-19 DIAGNOSIS — S3992XA Unspecified injury of lower back, initial encounter: Secondary | ICD-10-CM | POA: Diagnosis present

## 2020-11-19 DIAGNOSIS — S32029A Unspecified fracture of second lumbar vertebra, initial encounter for closed fracture: Secondary | ICD-10-CM | POA: Diagnosis not present

## 2020-11-19 DIAGNOSIS — Z79899 Other long term (current) drug therapy: Secondary | ICD-10-CM | POA: Diagnosis not present

## 2020-11-19 DIAGNOSIS — M4312 Spondylolisthesis, cervical region: Secondary | ICD-10-CM | POA: Diagnosis not present

## 2020-11-19 DIAGNOSIS — R519 Headache, unspecified: Secondary | ICD-10-CM | POA: Insufficient documentation

## 2020-11-19 DIAGNOSIS — Z87891 Personal history of nicotine dependence: Secondary | ICD-10-CM | POA: Diagnosis not present

## 2020-11-19 DIAGNOSIS — E1122 Type 2 diabetes mellitus with diabetic chronic kidney disease: Secondary | ICD-10-CM | POA: Insufficient documentation

## 2020-11-19 DIAGNOSIS — I2510 Atherosclerotic heart disease of native coronary artery without angina pectoris: Secondary | ICD-10-CM | POA: Diagnosis not present

## 2020-11-19 DIAGNOSIS — W11XXXA Fall on and from ladder, initial encounter: Secondary | ICD-10-CM | POA: Diagnosis not present

## 2020-11-19 DIAGNOSIS — I129 Hypertensive chronic kidney disease with stage 1 through stage 4 chronic kidney disease, or unspecified chronic kidney disease: Secondary | ICD-10-CM | POA: Insufficient documentation

## 2020-11-19 DIAGNOSIS — S0990XA Unspecified injury of head, initial encounter: Secondary | ICD-10-CM | POA: Diagnosis not present

## 2020-11-19 DIAGNOSIS — S3991XA Unspecified injury of abdomen, initial encounter: Secondary | ICD-10-CM

## 2020-11-19 DIAGNOSIS — S199XXA Unspecified injury of neck, initial encounter: Secondary | ICD-10-CM | POA: Diagnosis not present

## 2020-11-19 DIAGNOSIS — J449 Chronic obstructive pulmonary disease, unspecified: Secondary | ICD-10-CM | POA: Diagnosis not present

## 2020-11-19 DIAGNOSIS — Z7982 Long term (current) use of aspirin: Secondary | ICD-10-CM | POA: Insufficient documentation

## 2020-11-19 LAB — COMPREHENSIVE METABOLIC PANEL
ALT: 20 U/L (ref 0–44)
AST: 23 U/L (ref 15–41)
Albumin: 3.8 g/dL (ref 3.5–5.0)
Alkaline Phosphatase: 55 U/L (ref 38–126)
Anion gap: 5 (ref 5–15)
BUN: 17 mg/dL (ref 8–23)
CO2: 30 mmol/L (ref 22–32)
Calcium: 9 mg/dL (ref 8.9–10.3)
Chloride: 100 mmol/L (ref 98–111)
Creatinine, Ser: 0.91 mg/dL (ref 0.61–1.24)
GFR, Estimated: >60 mL/min (ref >60)
Glucose, Bld: 167 mg/dL — ABNORMAL HIGH (ref 70–99)
Potassium: 4.4 mmol/L (ref 3.5–5.1)
Sodium: 135 mmol/L (ref 135–145)
Total Bilirubin: 1.2 mg/dL (ref 0.3–1.2)
Total Protein: 6.7 g/dL (ref 6.5–8.1)

## 2020-11-19 LAB — CBC WITH DIFFERENTIAL/PLATELET
Abs Immature Granulocytes: 0.01 10*3/uL (ref 0.00–0.07)
Basophils Absolute: 0 10*3/uL (ref 0.0–0.1)
Basophils Relative: 1 %
Eosinophils Absolute: 0.1 10*3/uL (ref 0.0–0.5)
Eosinophils Relative: 2 %
HCT: 36.8 % — ABNORMAL LOW (ref 39.0–52.0)
Hemoglobin: 11.7 g/dL — ABNORMAL LOW (ref 13.0–17.0)
Immature Granulocytes: 0 %
Lymphocytes Relative: 19 %
Lymphs Abs: 0.8 10*3/uL (ref 0.7–4.0)
MCH: 29.5 pg (ref 26.0–34.0)
MCHC: 31.8 g/dL (ref 30.0–36.0)
MCV: 92.7 fL (ref 80.0–100.0)
Monocytes Absolute: 0.6 10*3/uL (ref 0.1–1.0)
Monocytes Relative: 14 %
Neutro Abs: 2.8 10*3/uL (ref 1.7–7.7)
Neutrophils Relative %: 64 %
Platelets: 193 10*3/uL (ref 150–400)
RBC: 3.97 MIL/uL — ABNORMAL LOW (ref 4.22–5.81)
RDW: 15.5 % (ref 11.5–15.5)
WBC: 4.4 10*3/uL (ref 4.0–10.5)
nRBC: 0 % (ref 0.0–0.2)

## 2020-11-19 MED ORDER — IOHEXOL 300 MG/ML  SOLN
100.0000 mL | Freq: Once | INTRAMUSCULAR | Status: AC | PRN
  Administered 2020-11-19: 100 mL via INTRAVENOUS

## 2020-11-19 NOTE — Discharge Instructions (Signed)
Use Tylenol for pain and fevers.  Up to 1000 mg per dose, up to 4 times per day.  Do not take more than 4000 mg of Tylenol/acetaminophen within 24 hours..  As we discussed, you did fracture/break the left-sided L2 transverse process.  Otherwise, no injuries on your CT scans.  That the bruising on your flank will linger for weeks and likely move positions with gravity and time.

## 2020-11-19 NOTE — ED Triage Notes (Signed)
Pt states he was on a ladder about 3-4 feet high and fell onto a steel coffee table Wednesday- pt has large bruise on his L flank and other small bruises on his back- pt has other minor scrapes on his arms

## 2020-11-19 NOTE — ED Provider Notes (Signed)
[facility] Emergency Department Provider Note ____________________________________________   None    (approximate)  I have reviewed the triage vital signs and the nursing notes.  HISTORY  Chief Complaint Fall   HPI Taylor Austin is a 80 y.o. malewho presents to the ED for evaluation of fall and flank pain/bruising.   Chart review indicates obese patient with a history of stroke on DAPT with Plavix.  Patient reports a fall that occurred 3 days ago, on Wednesday.  He reports accidentally falling about 4 feet off of a ladder while trying to put up an awning.  He reports he fell directly onto the edge of a metal coffee table striking his left flank.  He reports developing bruising to this area and aching pain.  He reports explicit concern for his kidney.  Reports moderate intensity pain that is worse with ambulation and twisting, nonradiating. No head injury, syncope, fevers or recent illnesses.  Past Medical History:  Diagnosis Date   Allergy    Anemia    Arthritis    Back pain    Barrett esophagus    BPH (benign prostatic hyperplasia)    Chronic kidney disease    COPD (chronic obstructive pulmonary disease) (HCC)    Coronary artery disease    Depression    Diabetes mellitus without complication (HCC)    Diverticulitis    Dysphagia    Esophageal reflux    Esophageal reflux    GERD (gastroesophageal reflux disease)    Heart murmur    Hyperlipidemia    Hypersomnia    Hypertension    Kidney stones    Low testosterone    OAB (overactive bladder)    Obesity    Presence of dental bridge    2 - top   Rheumatic fever    Sleep apnea    BiPAP   Stroke ([place]) 01/2016   Vertigo    Vitamin B12 deficiency     Patient Active Problem List   Diagnosis Date Noted   Hypomagnesemia 07/27/2020   Spondylosis without myelopathy or radiculopathy, lumbosacral region 07/27/2020   Mixed Alzheimer's and vascular dementia ([place]) 06/17/2020   Chronic pain  syndrome 06/15/2020   Pharmacologic therapy 06/15/2020   Disorder of skeletal system 06/15/2020   Problems influencing health status 06/15/2020   Abnormal MRI, cervical spine (11/14/2019) 06/15/2020   Abnormal MRI, lumbar spine (11/14/2019) 06/15/2020   History of lumbar laminectomy 06/15/2020   History of fusion of cervical spine 06/15/2020   DDD (degenerative disc disease), cervical 06/15/2020   DDD (degenerative disc disease), lumbosacral 06/15/2020   Lumbosacral facet arthropathy 06/15/2020   Cervical facet hypertrophy 06/15/2020   DJD (degenerative joint disease) of cervical spine 06/15/2020   Chronic anticoagulation (Plavix) 06/15/2020   Lumbar facet joint pain (Bilateral) 06/15/2020   History of CVA (cerebrovascular accident) 06/15/2020   Poor historian 06/15/2020   Chronic low back pain (1ry area of Pain) (Bilateral) (R>L) w/o sciatica 12/15/2019   Lumbar stenosis with neurogenic claudication 12/15/2019   Memory loss, short term 10/09/2019   Chronic obstructive pulmonary disease ([place]) 09/18/2018   Morbid obesity with BMI of 40.0-44.9, adult ([place]) 04/03/2018   Hyperlipidemia associated with type 2 diabetes mellitus ([place]) 02/05/2018   Gastroesophageal reflux disease without esophagitis 07/29/2017   Other dysphagia 07/29/2017   Acute cerebrovascular accident (CVA) ([place]) 01/30/2016   Chronic kidney disease 07/14/2014   Clinical depression 07/14/2014   Gait instability 05/05/2014   Arthritis of knee 07/03/2013   Arteriosclerosis of coronary artery  07/03/2013   Type 2 diabetes mellitus with diabetic neuropathy, without long-term current use of insulin ([place]) 07/03/2013   Benign essential HTN 07/03/2013   HLD (hyperlipidemia) 07/03/2013   Breath shortness 07/03/2013   Atherosclerotic heart disease of native coronary artery without angina pectoris 07/03/2013   Sleep apnea 06/18/2013   History of Barrett's esophagus 01/08/2001    Past Surgical History:  Procedure Laterality  Date   BACK SURGERY     CARDIAC CATHETERIZATION  08/11/2014   Procedure: Left Heart Cath and Coronary Angiography;  Surgeon: Teodoro Spray, MD;  Location: [place] [facility];  Service: Cardiovascular;;   CARDIAC CATHETERIZATION N/A 11/17/2014   Procedure: Right Heart Cath;  Surgeon: Teodoro Spray, MD;  Location: [place] [facility];  Service: Cardiovascular;  Laterality: N/A;   CAROTID STENT     CATARACT EXTRACTION W/PHACO Left 04/03/2016   Procedure: CATARACT EXTRACTION PHACO AND INTRAOCULAR LENS PLACEMENT ([place]) left diabetic;  Surgeon: Eulogio Bear, MD;  Location: [place];  Service: Ophthalmology;  Laterality: Left;  diabetic - oral meds sleep apnea   CATARACT EXTRACTION W/PHACO Right 05/01/2016   Procedure: CATARACT EXTRACTION PHACO AND INTRAOCULAR LENS PLACEMENT ([place]) Right diabetic;  Surgeon: Eulogio Bear, MD;  Location: [place];  Service: Ophthalmology;  Laterality: Right;  Diabetic oral meds sleep apnea   CERVICAL SPINE SURGERY     COLONOSCOPY     COLONOSCOPY WITH PROPOFOL N/A 06/10/2015   Procedure: COLONOSCOPY WITH PROPOFOL;  Surgeon: Manya Silvas, MD;  Location: [facility] ENDOSCOPY;  Service: Endoscopy;  Laterality: N/A;   CORONARY ANGIOPLASTY     ESOPHAGOGASTRODUODENOSCOPY (EGD) WITH PROPOFOL N/A 06/28/2017   Procedure: ESOPHAGOGASTRODUODENOSCOPY (EGD) WITH PROPOFOL;  Surgeon: Manya Silvas, MD;  Location[facility] ENDOSCOPY;  Service: Endoscopy;  Laterality: N/A;   ESOPHAGOGASTRODUODENOSCOPY ENDOSCOPY     EYE SURGERY     LUMBAR SPINE SURGERY     THORACIC SPINE SURGERY     TONSILLECTOMY      Prior to Admission medications   Medication Sig Start Date End Date Taking? Authorizing Provider  ACCU-CHEK AVIVA PLUS test strip USE STRIP TO CHECK GLUCOSE ONCE DAILY 06/19/17   [provider]  albuterol (PROVENTIL HFA;VENTOLIN HFA) 108 (90 Base) MCG/ACT inhaler Inhale into the lungs every 6 (six) hours as needed for wheezing or shortness of breath.     [provider]  aspirin EC 81 MG tablet Take 81 mg by mouth daily.    [provider]  atorvastatin (LIPITOR) 40 MG tablet TAKE 1 TABLET BY MOUTH ONCE DAILY 04/29/17   [provider]  calcium carbonate (OSCAL) 1500 (600 Ca) MG TABS tablet Take by mouth 2 (two) times daily with a meal.    [provider]  clopidogrel (PLAVIX) 75 MG tablet Take 1 tablet (75 mg total) by mouth daily. 02/01/16   Hillary Bow, MD  Cyanocobalamin (VITAMIN B-12 PO) Take by mouth.    [provider]  donepezil (ARICEPT) 10 MG tablet Take by mouth. 04/15/17 07/14/17  [provider]  DULoxetine (CYMBALTA) 60 MG capsule  08/14/18   [provider]  finasteride (PROSCAR) 5 MG tablet TAKE 1 TABLET EVERY DAY 03/18/20   Hollice Espy, MD  gabapentin (NEURONTIN) 300 MG capsule  09/13/20   [provider]  glimepiride (AMARYL) 4 MG tablet Take by mouth. 01/15/20 01/14/21  [provider]  glyBURIDE-metformin (GLUCOVANCE) 5-500 MG per tablet TAKE ONE TABLET BY MOUTH TWICE DAILY WITH MEALS 04/01/14   [provider]  INVOKANA 100 MG TABS tablet Take 100 mg by mouth daily. 04/16/20   [provider]  losartan (COZAAR) 100 MG tablet Take 100 mg by mouth daily.    [provider]  magnesium oxide (MAG-OX) 400 MG tablet Take 400 mg by mouth 2 (two) times daily.     [provider]  Multiple Vitamin (MULTI-VITAMINS) TABS Take 1 tablet by mouth daily.     [provider]  oxymetazoline (AFRIN) 0.05 % nasal spray Place 1 spray into both nostrils 2 (two) times daily.    [provider]  pantoprazole (PROTONIX) 40 MG tablet Take by mouth. 04/10/17   [provider]  pioglitazone (ACTOS) 15 MG tablet TAKE 3 TABLETS ONE TIME DAILY 03/17/20   [provider]  Probiotic Product (PROBIOTIC PO) Take by mouth.    [provider]  sildenafil (REVATIO) 20 MG tablet 1-5 tablets as needed one hour prior  to intercourse 04/26/20   Hollice Espy, MD  tamsulosin (FLOMAX) 0.4 MG CAPS capsule Take 1 capsule (0.4 mg total) by mouth daily after supper. 01/30/18   Noreene Filbert, MD  umeclidinium-vilanterol (ANORO ELLIPTA) 62.5-25 MCG/INH AEPB Inhale 1 puff into the lungs daily.    [provider]    Allergies Hydrocodone and Oxycodone-acetaminophen  Family History  Problem Relation Age of Onset   Stroke Father    Anesthesia problems Father    Anesthesia problems Mother    Prostate cancer Brother    Kidney disease Neg Hx    Bladder Cancer Neg Hx     Social History Social History   Tobacco Use   Smoking status: Former    Types: Cigarettes    Quit date: 07/13/1973    Years since quitting: 47.3   Smokeless tobacco: Never   Tobacco comments:    reports he smoked 4 packs a day for 10 years and quit in 1975  Vaping Use   Vaping Use: Never used  Substance Use Topics   Alcohol use: Yes    Alcohol/week: 1.0 standard drink    Types: 1 Standard drinks or equivalent per week    Comment: rarely drinks, for holidays   Drug use: No    Review of Systems  Constitutional: No fever/chills Eyes: No visual changes. ENT: No sore throat. Cardiovascular: Denies chest pain. Respiratory: Denies shortness of breath. Gastrointestinal: No abdominal pain.  No nausea, no vomiting.  No diarrhea.  No constipation. Genitourinary: Negative for dysuria. Musculoskeletal:  Positive for mechanical fall causing left flank bruising, pain Skin: Negative for rash. Neurological: Negative for headaches, focal weakness or numbness.  ____________________________________________   PHYSICAL EXAM:  VITAL SIGNS: Vitals:   11/19/20 1018  BP: 113/61  Pulse: 64  Resp: 18  Temp: 99.1 F (37.3 C)  SpO2: 95%     Constitutional: Alert and oriented. Well appearing and in no acute distress. Ambulatory with a normal gait.  Pleasant and conversational. Eyes: Conjunctivae are normal. PERRL. EOMI. Head:  Atraumatic. Nose: No congestion/rhinnorhea. Mouth/Throat: Mucous membranes are moist.  Oropharynx non-erythematous. Neck: No stridor. No cervical spine tenderness to palpation. Cardiovascular: Normal rate, regular rhythm. Grossly normal heart sounds.  Good peripheral circulation. Respiratory: Normal respiratory effort.  No retractions. Lungs CTAB. Gastrointestinal: Soft , nondistended, nontender to palpation. No CVA tenderness. Musculoskeletal: No lower extremity tenderness.  No joint effusions.  Rather large area of bruising to his left flank and left-sided paraspinal lumbar back.  About 12 inches laterally, and 3 inches in height.  Tender to palpation.  No discrete lacerations or evidence of open injury, no bleeding. No midline spinal step-offs Neurologic:  Normal speech and language. No gross focal neurologic deficits are appreciated. No gait instability noted. Cranial nerves II through XII intact 5/5 strength and sensation in all 4 extremities Skin:  Skin is warm, dry and intact. No rash noted. Psychiatric: Mood and affect are normal. Speech and behavior are normal.  ____________________________________________   LABS (all labs ordered are listed, but only abnormal results are displayed)  Labs Reviewed  CBC WITH DIFFERENTIAL/PLATELET - Abnormal; Notable for the following components:      Result Value   RBC 3.97 (*)    Hemoglobin 11.7 (*)    HCT 36.8 (*)    All other components within normal limits  COMPREHENSIVE METABOLIC PANEL - Abnormal; Notable for the following components:   Glucose, Bld 167 (*)    All other components within normal limits   ____________________________________________  12 Lead EKG   ____________________________________________  RADIOLOGY  ED MD interpretation:    Official radiology report(s): CT Head Wo Contrast  Result Date: 11/19/2020 CLINICAL DATA:  Neck trauma EXAM: CT HEAD WITHOUT CONTRAST CT CERVICAL SPINE WITHOUT CONTRAST TECHNIQUE:  Multidetector CT imaging of the head and cervical spine was performed following the standard protocol without intravenous contrast. Multiplanar CT image reconstructions of the cervical spine were also generated. COMPARISON:  Head CT dated January 30, 2016; MRI cervical spine dated November 13, 2019 FINDINGS: CT HEAD FINDINGS Brain: Chronic white matter ischemic change. No evidence of acute infarction, hemorrhage, hydrocephalus, extra-axial collection or mass lesion/mass effect. Vascular: No hyperdense vessel or unexpected calcification. Skull: Normal. Negative for fracture or focal lesion. Sinuses/Orbits: No acute finding. Other: None. CT CERVICAL SPINE FINDINGS Alignment: Mild grade 1 anterolisthesis of C3 on C4, likely degenerative. Skull base and vertebrae: No acute fracture. No primary bone lesion or focal pathologic process. Soft tissues and spinal canal: No prevertebral fluid or swelling. No visible canal hematoma. Disc levels: Prior ACDF at C5-C7. Mild multilevel degenerative disc disease. Upper chest: Not included in the field of view. Other: None. IMPRESSION: 1. No acute intracranial abnormality. 2. No CT evidence of traumatic cervical spine injury. Electronically Signed   By: Yetta Glassman M.D.   On: 11/19/2020 12:07   CT Cervical Spine Wo Contrast  Result Date: 11/19/2020 CLINICAL DATA:  Neck trauma EXAM: CT HEAD WITHOUT CONTRAST CT CERVICAL SPINE WITHOUT CONTRAST TECHNIQUE: Multidetector CT imaging of the head and cervical spine was performed following the standard protocol without intravenous contrast. Multiplanar CT image reconstructions of the cervical spine were also generated. COMPARISON:  Head CT dated January 30, 2016; MRI cervical spine dated November 13, 2019 FINDINGS: CT HEAD FINDINGS Brain: Chronic white matter ischemic change. No evidence of acute infarction, hemorrhage, hydrocephalus, extra-axial collection or mass lesion/mass effect. Vascular: No hyperdense vessel or unexpected  calcification. Skull: Normal. Negative for fracture or focal lesion. Sinuses/Orbits: No acute finding. Other: None. CT CERVICAL SPINE FINDINGS Alignment: Mild grade 1 anterolisthesis of C3 on C4, likely degenerative. Skull base and vertebrae: No acute fracture. No primary bone lesion or focal pathologic process. Soft tissues and spinal canal: No prevertebral fluid or swelling. No visible canal hematoma. Disc levels: Prior ACDF at C5-C7. Mild multilevel degenerative disc disease. Upper chest: Not included in the field of view. Other: None. IMPRESSION: 1. No acute intracranial abnormality. 2. No CT evidence of traumatic cervical spine injury. Electronically Signed   By: Yetta Glassman M.D.   On: 11/19/2020 12:07   CT  CHEST ABDOMEN PELVIS W CONTRAST  Result Date: 11/19/2020 CLINICAL DATA:  Abdominal trauma EXAM: CT CHEST, ABDOMEN, AND PELVIS WITH CONTRAST TECHNIQUE: Multidetector CT imaging of the chest, abdomen and pelvis was performed following the standard protocol during bolus administration of intravenous contrast. CONTRAST:  186mL OMNIPAQUE IOHEXOL 300 MG/ML  SOLN COMPARISON:  Lumbar spine MRI 11/13/2019. FINDINGS: CT CHEST FINDINGS Cardiovascular: Normal cardiac size.No pericardial disease.Coronary artery calcifications.No central pulmonary embolism. Mediastinum/Nodes: No lymphadenopathy.The thyroid is unremarkable.Esophagus is unremarkable.The trachea is unremarkable. Lungs/Pleura: No focal airspace consolidation.No suspicious pulmonary nodules or masses.No pleural effusion.No pneumothorax. Musculoskeletal: No acute osseous abnormality.No suspicious lytic or blastic lesions. Multilevel degenerative changes of the spine. Partially visualized cervical spine ACDF. CT ABDOMEN PELVIS FINDINGS Hepatobiliary: No hepatic injury or perihepatic hematoma. No gallstones, gallbladder wall thickening, or biliary dilatation. Pancreas: Unremarkable. No pancreatic ductal dilatation or surrounding inflammatory changes.  Spleen: No splenic injury or parasplenic hematoma. Adrenals/Urinary Tract: No adrenal hemorrhage or renal injury identified. Bladder is unremarkable. No hydronephrosis. There is a punctate nonobstructive right renal stone. Simple left renal cyst. Bladder is unremarkable. Stomach/Bowel: The stomach is within normal limits. There is no evidence of bowel obstruction. There is a probable small duodenal diverticulum. The appendix is normal. Colonic diverticulosis without evidence of acute diverticulitis. There is a moderate colonic stool burden, most prominent at the hepatic flexure. No acute inflammatory process involving the bowel. No evidence of acute bowel injury. Vascular/Lymphatic: No significant vascular findings are present. No enlarged abdominal or pelvic lymph nodes. Reproductive: Unremarkable. Other: Bilateral fat containing inguinal hernias. No bowel containing hernia. No abdominopelvic ascites. No free air. Musculoskeletal: There is a minimally displaced left L2 transverse process fracture. There is chronic 20% anterior height loss of T12 as seen on prior MRI in November 2021. Multilevel degenerative changes of the spine. Mild bilateral hip osteoarthritis. IMPRESSION: Minimally displaced left L2 transverse process fracture. No other acute trauma in the abdomen or pelvis. No evidence of acute trauma in the chest. Electronically Signed   By: Maurine Simmering M.D.   On: 11/19/2020 12:11    ____________________________________________   PROCEDURES and INTERVENTIONS  Procedure(s) performed (including Critical Care):  Procedures  Medications  iohexol (OMNIPAQUE) 300 MG/ML solution 100 mL (100 mLs Intravenous Contrast Given 11/19/20 1155)    ____________________________________________   MDM / ED COURSE   80 year old male on DAPT presents to the ED a few days after mechanical fall with evidence of a L2 transverse process fracture and overlying flank bruising, but amenable to outpatient management.   Normal vitals and reassuring examination.  He is ambulatory without evidence of neurologic vascular deficits.  Large area of bruising to his left lower flank and paraspinal lumbar back.  No spinal step-offs.  No evidence of open injury.  Pan CT imaging obtained due to the mechanism of trauma and him being on DAPT.  Only demonstrates L2 transverse process fracture.  Otherwise benign.  We will discharge with return precautions.     ____________________________________________   FINAL CLINICAL IMPRESSION(S) / ED DIAGNOSES  Final diagnoses:  Abdominal trauma  Fall, initial encounter  Closed fracture of transverse process of lumbar vertebra, initial encounter [facility])     ED Discharge Orders     None        Akya Fiorello   Note:  This document was prepared using Dragon voice recognition software and may include unintentional dictation errors.    Vladimir Crofts, MD 11/19/20 [id number]

## 2020-11-21 NOTE — Progress Notes (Signed)
   11/22/2020 CC:  Chief Complaint  Patient presents with   Cysto     HPI: Taylor Austin is a 80 y.o. male with a personal history of of prostate cancer, voiding dysfunction.OAB, who returns today for cystoscopy.  His surgical pathology is consistent with intermediate Gleason 4+3 prostate cancer. He is s/p IMRT in 12/2017.    Prostate biopsy on 08/13/2017 revealed 2 cores positive of disease, 2% of the left base described as 4+3 prostate cancer as well as Gleason 3+3 at the left mid involving only 2%.  There is also a suspicious lesion in the left apex but this was nondiagnostic.   UDS 12/2017  indicated that he may also have dysfunctional voiding and OAB. Previously tried multiple medications including anticholinergics, finasteride, Myrbetriq, PTNS without any improvement.  He previously failed all anticholinergics.   He reports today that he has an appointment with physical therapy.   Vitals:   11/22/20 1501  BP: 134/84  Pulse: 87   NED. A&Ox3.   No respiratory distress   Abd soft, NT, ND Normal phallus with bilateral descended testicles  Cystoscopy Procedure Note  Patient identification was confirmed, informed consent was obtained, and patient was prepped using Betadine solution.  Lidocaine jelly was administered per urethral meatus.     Pre-Procedure: - Inspection reveals a normal caliber ureteral meatus.  Procedure: The flexible cystoscope was introduced without difficulty - No urethral strictures/lesions are present. - Normal prostate  - Normal bladder neck - Bilateral ureteral orifices identified - Bladder mucosa  reveals no ulcers, tumors, or lesions - No bladder stones - Mild trabeculation - Small diverticulum at the dome of the bladder.   Retroflexion shows unremarkable    Post-Procedure: - Patient tolerated the procedure well   Assessment/ Plan:  Refractory urgency and urge incontinence  - Cystoscopy unremarkable today   2. Pelvic floor  dysfunction - Severe urinary symptoms on maximal medical therapy with finasteride and Flomax, previously failed all anticholinergics   - Urodynamics consistent with pelvic floor dysfunction  - He has scheduled appointment with physical therapy   Return in 3 months for IPSS, PVR, UA   I,Kailey Littlejohn,acting as a scribe for Hollice Espy, MD.,have documented all relevant documentation on the behalf of Hollice Espy, MD,as directed by  Hollice Espy, MD while in the presence of Hollice Espy, MD.  I have reviewed the above documentation for accuracy and completeness, and I agree with the above.   Hollice Espy, MD

## 2020-11-22 ENCOUNTER — Ambulatory Visit: Payer: Medicare HMO | Admitting: Urology

## 2020-11-22 ENCOUNTER — Other Ambulatory Visit: Payer: Self-pay

## 2020-11-22 VITALS — BP 134/84 | HR 87 | Ht 65.0 in | Wt 260.0 lb

## 2020-11-22 DIAGNOSIS — C61 Malignant neoplasm of prostate: Secondary | ICD-10-CM

## 2020-11-22 LAB — URINALYSIS, COMPLETE
Bilirubin, UA: NEGATIVE
Leukocytes,UA: NEGATIVE
Nitrite, UA: NEGATIVE
Protein,UA: NEGATIVE
Specific Gravity, UA: 1.015 (ref 1.005–1.030)
Urobilinogen, Ur: 1 mg/dL (ref 0.2–1.0)
pH, UA: 6.5 (ref 5.0–7.5)

## 2020-11-22 LAB — MICROSCOPIC EXAMINATION
Bacteria, UA: NONE SEEN
Epithelial Cells (non renal): NONE SEEN /hpf (ref 0–10)

## 2020-11-29 ENCOUNTER — Ambulatory Visit: Payer: Medicare HMO | Attending: Family Medicine | Admitting: Physical Therapy

## 2020-11-29 ENCOUNTER — Other Ambulatory Visit: Payer: Self-pay

## 2020-11-29 DIAGNOSIS — R278 Other lack of coordination: Secondary | ICD-10-CM | POA: Insufficient documentation

## 2020-11-29 DIAGNOSIS — C61 Malignant neoplasm of prostate: Secondary | ICD-10-CM | POA: Insufficient documentation

## 2020-11-29 DIAGNOSIS — Z9181 History of falling: Secondary | ICD-10-CM | POA: Diagnosis not present

## 2020-11-29 DIAGNOSIS — N393 Stress incontinence (female) (male): Secondary | ICD-10-CM | POA: Diagnosis not present

## 2020-11-29 DIAGNOSIS — R2689 Other abnormalities of gait and mobility: Secondary | ICD-10-CM | POA: Diagnosis not present

## 2020-11-29 NOTE — Patient Instructions (Addendum)
PELVIC FLOOR / KEGEL EXERCISES   Pelvic floor/ Kegel exercises are used to strengthen the muscles in the base of your pelvis that are responsible for supporting your pelvic organs and preventing urine/feces leakage. Based on your therapist's recommendations, they can be performed while standing, sitting, or lying down.  Make yourself aware of this muscle group by using these cues: Imagine you are in a crowded room and you feel the need to pass gas. Your response is to pull up and in at the rectum. Close the rectum. Pull the muscles up inside your body,feeling your scrotum lifting as well . Feel the pelvic floor muscles lift as if you were walking into a cold lake. Place your hand on top of your pubic bone. Tighten and draw in the muscles around the anal muscles without squeezing the buttock muscles.  Common Errors: Breath holding: If you are holding your breath, you may be bearing down against your bladder instead of pulling it up. If you belly bulges up while you are squeezing, you are holding your breath. Be sure to breathe gently in and out while exercising. Counting out loud may help you avoid holding your breath. Accessory muscle use: You should not see or feel other muscle movement when performing pelvic floor exercises. When done properly, no one can tell that you are performing the exercises. Keep the buttocks, belly and inner thighs relaxed. Overdoing it: Your muscles can fatigue and stop working for you if you over-exercise. You may actually leak more or feel soreness at the lower abdomen or rectum.  YOUR HOME EXERCISE PROGRAM   SHORT HOLDS: Position: on back, Inhale and then exhale. Then squeeze the muscle.  (Be sure to let belly sink in with exhales and not push outward) Perform 4 repetitions, 5  Times/day   TEN SLOW BREATHS -   LONG HOLDS: Position: on back Inhale and then exhale. Then squeeze the muscle and count aloud for 3 seconds. Rest with three long breaths. (Be sure to  let belly sink in with exhales and not push outward) Perform 5  repetitions, __5_  different times/day  **ALSO SQUEEZE BEFORE YOUR SNEEZE, COUGH, LAUGH to decrease downward pressure   **ALSO EXHALE BEFORE YOU RISE AGAINST GRAVITy  ___   Proper body mechanics with getting out of a chair to decrease strain  on back &pelvic floor   Avoid holding your breath when Getting out of the chair:  Scoot to front part of chair chair Heels behind knees, feet are hip width apart, nose over toes  Inhale like you are smelling roses Exhale to stand    __  Refer to vestibular rehab PT after consulting doctor referral  [email or web address] Phone [phone] Fax [phone]   ___  See your doctor about your falls and bruises   __ Use walker at home as you heal from your fall and while you have vertigo to minimize falls

## 2020-11-30 NOTE — Therapy (Signed)
[place] MAIN [facility] SERVICES [address] [place], [place], [address] Phone: [phone]   Fax:  [phone]  Physical Therapy Evaluation / Discharge Summary  Patient Details  Name: Taylor Austin MRN: [id number] Date of Birth: [birth year] Referring Provider (PT): Erlene Quan   Encounter Date: 11/29/2020   PT End of Session - 11/30/20 1303     Visit Number 1    Number of Visits 1    Date for PT Re-Evaluation 11/30/20    PT Start Time 1700    PT Stop Time 0102    PT Time Calculation (min) 70 min    Activity Tolerance Patient tolerated treatment well    Behavior During Therapy [facility] for tasks assessed/performed             Past Medical History:  Diagnosis Date   Allergy    Anemia    Arthritis    Back pain    Barrett esophagus    BPH (benign prostatic hyperplasia)    Chronic kidney disease    COPD (chronic obstructive pulmonary disease) ([place])    Coronary artery disease    Depression    Diabetes mellitus without complication (HCC)    Diverticulitis    Dysphagia    Esophageal reflux    Esophageal reflux    GERD (gastroesophageal reflux disease)    Heart murmur    Hyperlipidemia    Hypersomnia    Hypertension    Kidney stones    Low testosterone    OAB (overactive bladder)    Obesity    Presence of dental bridge    2 - top   Rheumatic fever    Sleep apnea    BiPAP   Stroke ([place]) 01/2016   Vertigo    Vitamin B12 deficiency     Past Surgical History:  Procedure Laterality Date   BACK SURGERY     CARDIAC CATHETERIZATION  08/11/2014   Procedure: Left Heart Cath and Coronary Angiography;  Surgeon: Teodoro Spray, MD;  Location: [place] [facility];  Service: Cardiovascular;;   CARDIAC CATHETERIZATION N/A 11/17/2014   Procedure: Right Heart Cath;  Surgeon: Teodoro Spray, MD;  Location: [place] [facility];  Service: Cardiovascular;  Laterality: N/A;   CAROTID STENT     CATARACT EXTRACTION W/PHACO Left 04/03/2016    Procedure: CATARACT EXTRACTION PHACO AND INTRAOCULAR LENS PLACEMENT ([place]) left diabetic;  Surgeon: Eulogio Bear, MD;  Location: [place];  Service: Ophthalmology;  Laterality: Left;  diabetic - oral meds sleep apnea   CATARACT EXTRACTION W/PHACO Right 05/01/2016   Procedure: CATARACT EXTRACTION PHACO AND INTRAOCULAR LENS PLACEMENT ([place]) Right diabetic;  Surgeon: Eulogio Bear, MD;  Location: [place];  Service: Ophthalmology;  Laterality: Right;  Diabetic oral meds sleep apnea   CERVICAL SPINE SURGERY     COLONOSCOPY     COLONOSCOPY WITH PROPOFOL N/A 06/10/2015   Procedure: COLONOSCOPY WITH PROPOFOL;  Surgeon: Manya Silvas, MD;  Location: [facility] ENDOSCOPY;  Service: Endoscopy;  Laterality: N/A;   CORONARY ANGIOPLASTY     ESOPHAGOGASTRODUODENOSCOPY (EGD) WITH PROPOFOL N/A 06/28/2017   Procedure: ESOPHAGOGASTRODUODENOSCOPY (EGD) WITH PROPOFOL;  Surgeon: Manya Silvas, MD;  Location: [facility] ENDOSCOPY;  Service: Endoscopy;  Laterality: N/A;   ESOPHAGOGASTRODUODENOSCOPY ENDOSCOPY     EYE SURGERY     LUMBAR SPINE SURGERY     THORACIC SPINE SURGERY     TONSILLECTOMY      There were no vitals filed for this visit.  Subjective Assessment - 11/29/20 1719     Subjective Pt had 40 radiation Tx for prostate CA 2019-2020. Pt started wearing pads for leakage for the past year. When he stands up from sitting, he gets the urge to urinate. It is not bad when he does not wait too long.  Pt wears pad inside a pull up. Pt had a fall off ladder and stool. Wife reports he finally consented to seeking medical attention for first fall after he had increased bruising / swelling on his back. Pt has not seen medical provider since recent fall off ladder. Pt reports he has had vertigo. He currently has vertigo.    Patient is accompained by: Family member   wife               [facility] PT Assessment - 11/30/20 2206       Assessment   Medical Diagnosis prostate cancer     Referring Provider (PT) Erlene Quan      Precautions   Precautions Fall      Balance Screen   Has the patient fallen in the past 6 months Yes    How many times? 2   off ladder and off stool   Has the patient had a decrease in activity level because of a fear of falling?  Yes    Is the patient reluctant to leave their home because of a fear of falling?  No      Prior Function   Level of Independence Independent      Observation/Other Assessments   Observations brusing noted on L foot, Did not assess bruising he reported on back of leg/ back      Sit to Stand   Comments IND but antalgic due to bruised foot from fall      Strength   Overall Strength Comments BLE seated 4/5      Bed Mobility   Bed Mobility --   IND with logrolling <> sit     Ambulation/Gait   Gait Comments limping, scissoring pattern, antalgic, ( gait belt provided from waiting area to PT office, WC provided from office to car). Pt relied on UE support for balance during t/f.                        Objective measurements completed on examination: See above findings.     Pelvic Floor Special Questions - 11/30/20 2207     External Perineal Exam through clothing, 3 sec, 5 reps in hooklying,  quick contractions 1 sec, 4 reps              [facility] Adult PT Treatment/Exercise - 11/30/20 2206       Therapeutic Activites    Other Therapeutic Activities educated on kegels, referral to vestibular rehab, referral to MD to assess injuries related to past falls, receive prescription for foot protection to minimize pain and fall risks      Neuro Re-ed    Neuro Re-ed Details  cued for kegel contractions                       PT Short Term Goals - 11/30/20 2138       PT SHORT TERM GOAL #1   Title Pt will demo proper deep core coordination in order to progress to kegel exercises    Time 1    Period Days    Status Achieved    Target Date 11/29/20  PT SHORT TERM GOAL #2   Title Pt  will demo 3 sec, > 3 reps in hooklying in order to gain endurance of pelvic floor mm to gain continence    Time 1    Period Days    Status Achieved    Target Date 11/29/20      PT SHORT TERM GOAL #3   Title Pt will be referred to vestibular rehab for vertigo, explained fall prevention with use of walker, referred to MD re:  injuries to foot, back, leg from past falls.    Time 1    Period Days    Status Achieved    Target Date 11/29/20                       Plan - 11/30/20 2203     Clinical Impression Statement Pt is a 80 yo who reports urinary incontinence after radiation Tx for prostate cancer in 2019-2020. Pt was educated on short and long pelvic floor contractions with proper deep core coordination. Discharging pt today because pt had 2 falls recently which have impact his gait. Pt also has vertigo. Pt presented with gait deviations that places him for high fall risk. Explained to pt and wife that pt would benefit from referral to MD to assess his falls and to get referral for vestibular rehab for his vertigo which contributes to his gait deviations and  high fall risk. Explained priority right now is to address his medical conditions to ensure safety with his mobility, recovery from falls, and address his vertigo. Pt demo'd IND with kegel training which he can continue to practice in hooklying position, safe from falls. Wife and pt voiced understanding and plan to contact his MD.   Pt is d/c today.    Personal Factors and Comorbidities Comorbidity 3+    Examination-Activity Limitations Continence    Stability/Clinical Decision Making Unstable/Unpredictable    Clinical Decision Making High    Rehab Potential Good    PT Frequency One time visit    PT Treatment/Interventions Neuromuscular re-education;Therapeutic activities;ADLs/Self Care Home Management    Consulted and Agree with Plan of Care Patient             Patient will benefit from skilled therapeutic  intervention in order to improve the following deficits and impairments:  Decreased activity tolerance, Decreased coordination, Decreased safety awareness, Decreased strength, Decreased range of motion, Decreased cognition, Decreased balance, Abnormal gait, Decreased scar mobility, Decreased mobility, Decreased endurance, Improper body mechanics, Difficulty walking, Postural dysfunction, Pain, Impaired sensation, Increased muscle spasms, Hypomobility  Visit Diagnosis: Other lack of coordination  Stress incontinence (male) (male)  Other abnormalities of gait and mobility  Risk for falls     Problem List Patient Active Problem List   Diagnosis Date Noted   Hypomagnesemia 07/27/2020   Spondylosis without myelopathy or radiculopathy, lumbosacral region 07/27/2020   Mixed Alzheimer's and vascular dementia ([place]) 06/17/2020   Chronic pain syndrome 06/15/2020   Pharmacologic therapy 06/15/2020   Disorder of skeletal system 06/15/2020   Problems influencing health status 06/15/2020   Abnormal MRI, cervical spine (11/14/2019) 06/15/2020   Abnormal MRI, lumbar spine (11/14/2019) 06/15/2020   History of lumbar laminectomy 06/15/2020   History of fusion of cervical spine 06/15/2020   DDD (degenerative disc disease), cervical 06/15/2020   DDD (degenerative disc disease), lumbosacral 06/15/2020   Lumbosacral facet arthropathy 06/15/2020   Cervical facet hypertrophy 06/15/2020   DJD (degenerative joint disease) of cervical spine 06/15/2020  Chronic anticoagulation (Plavix) 06/15/2020   Lumbar facet joint pain (Bilateral) 06/15/2020   History of CVA (cerebrovascular accident) 06/15/2020   Poor historian 06/15/2020   Chronic low back pain (1ry area of Pain) (Bilateral) (R>L) w/o sciatica 12/15/2019   Lumbar stenosis with neurogenic claudication 12/15/2019   Memory loss, short term 10/09/2019   Chronic obstructive pulmonary disease ([place]) 09/18/2018   Morbid obesity with BMI of 40.0-44.9,  adult ([place]) 04/03/2018   Hyperlipidemia associated with type 2 diabetes mellitus ([place]) 02/05/2018   Gastroesophageal reflux disease without esophagitis 07/29/2017   Other dysphagia 07/29/2017   Acute cerebrovascular accident (CVA) ([place]) 01/30/2016   Chronic kidney disease 07/14/2014   Clinical depression 07/14/2014   Gait instability 05/05/2014   Arthritis of knee 07/03/2013   Arteriosclerosis of coronary artery 07/03/2013   Type 2 diabetes mellitus with diabetic neuropathy, without long-term current use of insulin ([place]) 07/03/2013   Benign essential HTN 07/03/2013   HLD (hyperlipidemia) 07/03/2013   Breath shortness 07/03/2013   Atherosclerotic heart disease of native coronary artery without angina pectoris 07/03/2013   Sleep apnea 06/18/2013   History of Barrett's esophagus 01/08/2001    Jerl Mina, PT 11/30/2020, 10:17 PM  [place] MAIN [facility] SERVICES [address] [place], [place], [address] Phone: [phone]   Fax:  [phone]  Name: JONTE SHILLER MRN: [id number] Date of Birth: Dec 30, 1940

## 2020-12-05 DIAGNOSIS — M79672 Pain in left foot: Secondary | ICD-10-CM | POA: Diagnosis not present

## 2020-12-06 NOTE — Progress Notes (Signed)
PROVIDER NOTE: Information contained herein reflects review and annotations entered in association with encounter. Interpretation of such information and data should be left to medically-trained personnel. Information provided to patient can be located elsewhere in the medical record under "Patient Instructions". Document created using STT-dictation technology, any transcriptional errors that may result from process are unintentional.    Patient: Taylor Austin  Service Category: E/M  Provider: Gaspar Cola, MD  DOB: 08-13-1940  DOS: 12/07/2020  Specialty: Interventional Pain Management  MRN: [id number]  Setting: Ambulatory outpatient  PCP: Juluis Pitch, MD  Type: Established Patient    Referring Provider: Juluis Pitch, MD  Location: Office  Delivery: Face-to-face     Primary Reason(s) for Visit: Encounter for evaluation before starting new chronic pain management plan of care (Level of risk: moderate) CC: Back Pain  HPI  Taylor Austin is a 80 y.o. year old, male patient, who comes today for a follow-up evaluation to review the test results and decide on a treatment plan. He has Arthritis of knee; Chronic kidney disease; Arteriosclerosis of coronary artery; Clinical depression; Type 2 diabetes mellitus with diabetic neuropathy, without long-term current use of insulin ([place]); Benign essential HTN; Gait instability; HLD (hyperlipidemia); Breath shortness; Sleep apnea; Acute cerebrovascular accident (CVA) ([place]); Morbid obesity with BMI of 40.0-44.9, adult ([place]); Chronic low back pain (1ry area of Pain) (Bilateral) (R>L) w/o sciatica; Chronic obstructive pulmonary disease ([place]); Gastroesophageal reflux disease without esophagitis; History of Barrett's esophagus; Hyperlipidemia associated with type 2 diabetes mellitus ([place]); Lumbar stenosis with neurogenic claudication; Memory loss, short term; Other dysphagia; Atherosclerotic heart disease of native coronary artery without angina pectoris;  Chronic pain syndrome; Pharmacologic therapy; Disorder of skeletal system; Problems influencing health status; Abnormal MRI, cervical spine (11/14/2019); Abnormal MRI, lumbar spine (11/14/2019); History of lumbar laminectomy; History of fusion of cervical spine; DDD (degenerative disc disease), cervical; DDD (degenerative disc disease), lumbosacral; Lumbosacral facet arthropathy (Multilevel) (Bilateral); Cervical facet hypertrophy; DJD (degenerative joint disease) of cervical spine; Chronic anticoagulation (Plavix); Lumbar facet joint pain (Bilateral); History of CVA (cerebrovascular accident); Poor historian; Mixed Alzheimer's and vascular dementia ([place]); Hypomagnesemia; Spondylosis without myelopathy or radiculopathy, lumbosacral region; Aortic atherosclerosis ([place]); Vertigo, Chronic; Retrolisthesis of L2 over L3; and At high risk for falls on their problem list. His primarily concern today is the Back Pain  Pain Assessment: Location: Lower Back Radiating: Radiates from lower back into the left leg (entire leg) into the toes of left foot Onset: More than a month ago Duration: Chronic pain Quality: Constant Severity: 7 /10 (subjective, self-reported pain score)  Effect on ADL: "Makes walking worse and slows me down". Timing: Constant Modifying factors: Resting and laying down BP: (!) 146/99  HR: 92  Taylor Austin comes in today for a follow-up visit after his initial evaluation on 11/08/2020. Today we went over the results of his tests. These were explained in "Layman's terms". During today's appointment we went over my diagnostic impression, as well as the proposed treatment plan.  Review of initial evaluation: "The patient is a poor historian.  Thankfully his wife was present and very helpful.  He also seems to have great difficulty allowing for a 2 way communication take place.  For this reason, extracting information from this patient was very difficult and took a lot longer than it should  have.  According to the patient the primary area of pain inside of the lower back (Bilateral) (R>L) he indicates having had 1 prior lumbar spine surgery that took place around September 2006 at the  [facility] in [place].  His surgeon appears to have been a Dr.Scheid (they did not know if the surgeon was a neurosurgeon or an Doctor, general practice).  They described the pain as being referred to both buttocks areas and having a "shooting" character to it (neuropathic).  According to them he had physical therapy but it did not help.  Apparently he started it twice but it was not completed.  The patient has a relatively recent MRI.   Interventional therapies: (by Girtha Hake, MD-PMR) 1.  Left L4 TFESI x1 (03/03/2020) 2.  Left L5 TFESI x2 (01/14/2020; 02/11/2020)   The patient secondary area of pain is that of the lower extremities (Bilateral) (R>L).  This is the part of the encounter that was very confusing since he admitted to having right lower extremity pain going all the way down to the top of the foot and what seems to be an L5 dermatomal distribution however, on the left side he started going back and forth and eventually lost his patience and indicated that he did not have any pain on the left lower extremity.  He admitted to having bilateral cramps in his calf that appears to victim primarily at bedtime.  He also describes being seen bilateral lower extremity muscle wasting and numbness, which again I cannot extract from him the location of this numbness.  Every time that I asked him a specific question he would go back to saying that he is primary pain was in the lower back and buttocks.  I asked him whether or not he had any pain in the hips and the knees, but he did not provide me with information on this.   Physical exam: The patient had negative bilateral straight leg raise.  Positive pain in the lower lumbar region with Lumbar spine hyperextension.  Hyperextension and rotation  maneuver as well as Lynelle Smoke maneuver was positive for bilateral lumbar facet arthropathy.  Provocative Patrick maneuver was negative bilaterally for hip or sacroiliac joint pain.   The patient also has multiple medical problems including having had a stroke around January 2008 and currently being on Plavix anticoagulation.  He is morbidly obese, COPD, with chronic kidney disease, gastroesophageal reflux disease, type 2 diabetes, hypertension, coronary artery disease, hyperlipidemia, shortness of breath, sleep apnea, and morbid obesity with a BMI of 43.27 kg/m."  Today we reviewed the results of the lab work and ordered imaging studies.  These were explained to the patient in layman's terms.  Patient made aware of the hypomagnesemia and recommended to take over-the-counter magnesium 400-500 mg p.o. twice daily since he does not appear to be having any kidney problems.  Today's physical exam: Today we reviewed the physical exam done on the initial evaluation and we have confirmed that the patient had a negative bilateral straight leg raise; positive bilateral reproduction of the low back pain on Lumbar hyperextension on rotation maneuver (Kemps maneuver).  However, today when the [facility] maneuver was performed, the patient did have a positive left hip arthralgia and decreased range of motion suggesting the possibility of left hip arthropathy.  However, what stood up as exactly reproducing the patient's pain was the hyperextension and rotation maneuver suggesting the primary etiology of the lumbar spine to be bilateral lumbar facet pain.  Flexion-extension x-rays of the lumbar spine did confirm retrolisthesis, DJD and DDD of the lumbar spine, anterior wedging of T12 and L2 as well as lumbar facet arthropathy.  The anterior wedging of L2 has not changed since  prior imaging done in 2016 and the T12 wedging has also not changed since prior imaging done on November 2021.  Neither 1 seems to be acute.  The  retrolisthesis seems to be stable as there is no pathological movement described.  In considering the treatment plan options, Mr. Haskew was reminded that I no longer take patients for medication management only. I asked him to let me know if he had no intention of taking advantage of the interventional therapies, so that we could make arrangements to provide this space to someone interested. I also made it clear that undergoing interventional therapies for the purpose of getting pain medications is very inappropriate on the part of a patient, and it will not be tolerated in this practice. This type of behavior would suggest true addiction and therefore it requires referral to an addiction specialist.   Further details on both, my assessment(s), as well as the proposed treatment plan, please see below.  Controlled Substance Pharmacotherapy Assessment REMS (Risk Evaluation and Mitigation Strategy)  Analgesic: None. No chronic opioid analgesics therapy prescribed by our practice. MME/day: 0 mg/day  Pill Count: None expected due to no prior prescriptions written by our practice. Al Decant, RN  12/07/2020 10:14 AM  Signed Secure message sent to PCP Youlanda Roys. Lovie Macadamia, MD regarding approval to pause Plavix 15m 7-10 days prior to Lumbar Facet Block procedure.   EAl Decant RN   RAl Decant RN  12/07/2020  8:35 AM  Sign when Signing Visit Safety precautions to be maintained throughout the outpatient stay will include: orient to surroundings, keep bed in low position, maintain call bell within reach at all times, provide assistance with transfer out of bed and ambulation.    Pharmacokinetics: Liberation and absorption (onset of action): WNL Distribution (time to peak effect): WNL Metabolism and excretion (duration of action): WNL         Pharmacodynamics: Desired effects: Analgesia: Mr. GGovonireports >50% benefit. Functional ability: Patient reports that medication allows him to  accomplish basic ADLs Clinically meaningful improvement in function (CMIF): Sustained CMIF goals met Perceived effectiveness: Described as relatively effective, allowing for increase in activities of daily living (ADL) Undesirable effects: Side-effects or Adverse reactions: None reported Monitoring:  PMP: PDMP reviewed during this encounter. Online review of the past 142-montheriod previously conducted. Not applicable at this point since we have not taken over the patient's medication management yet. List of other Serum/Urine Drug Screening Test(s):  No results found for: AMPHSCRSER, BARBSCRSER, BENZOSCRSER, COCAINSCRSER, COCAINSCRNUR, PCPSCRSER, THCSCRSER, THCU, CANNABQUANT, OP[place]con[place] of all UDS test(s) done:  Lab Results  Component Value Date   SUMMARY Note 06/15/2020   Last UDS on record: Summary  Date Value Ref Range Status  06/15/2020 Note  Final    Comment:    ==================================================================== Compliance Drug Analysis, Ur ==================================================================== Test                             Result       Flag       Units  Drug Present and Declared for Prescription Verification   Gabapentin                     PRESENT      EXPECTED   Duloxetine                     PRESENT      EXPECTED  Drug  Present not Declared for Prescription Verification   Acetaminophen                  PRESENT      UNEXPECTED   Ibuprofen                      PRESENT      UNEXPECTED   Diphenhydramine                PRESENT      UNEXPECTED   Lidocaine                      PRESENT      UNEXPECTED  Drug Absent but Declared for Prescription Verification   Salicylate                     Not Detected UNEXPECTED    Aspirin, as indicated in the declared medication list, is not always    detected even when used as directed.  ==================================================================== Test                       Result    Flag   Units      Ref Range   Creatinine              34               mg/dL      >=20 ==================================================================== Declared Medications:  The flagging and interpretation on this report are based on the  following declared medications.  Unexpected results may arise from  inaccuracies in the declared medications.   **Note: The testing scope of this panel includes these medications:   Duloxetine (Cymbalta)  Gabapentin (Neurontin)   **Note: The testing scope of this panel does not include small to  moderate amounts of these reported medications:   Aspirin   **Note: The testing scope of this panel does not include the  following reported medications:   Albuterol (Ventolin HFA)  Atorvastatin (Lipitor)  Calcium  Canagliflozin (Invokana)  Clopidogrel (Plavix)  Cyanocobalamin  Donepezil (Aricept)  Finasteride (Proscar)  Glimepiride (Amaryl)  Glyburide (Glucovance)  Losartan (Cozaar)  Magnesium (Mag-Ox)  Metformin (Glucovance)  Multivitamin  Oxymetazoline (Afrin)  Pantoprazole (Protonix)  Pioglitazone (Actos)  Probiotic  Sildenafil  Tamsulosin (Flomax)  Umeclidinium (Anoro)  Vilanterol (Anoro) ==================================================================== For clinical consultation, please call [phone]. ====================================================================    UDS interpretation: No unexpected findings.          Medication Assessment Form: Not applicable. No opioids. Treatment compliance: Not applicable Risk Assessment Profile: Aberrant behavior: See initial evaluations. None observed or detected today Comorbid factors increasing risk of overdose: See initial evaluation. No additional risks detected today Opioid risk tool (ORT):  Opioid Risk  12/07/2020  Alcohol 3  Illegal Drugs 0  Rx Drugs 0  Alcohol 0  Illegal Drugs 0  Rx Drugs 0  Age between 16-45 years  0  History of  Preadolescent Sexual Abuse -  Psychological Disease 0  Depression 0  Opioid Risk Tool Scoring 3  Opioid Risk Interpretation Low Risk    ORT Scoring interpretation table:  Score <3 = Low Risk for SUD  Score between 4-7 = Moderate Risk for SUD  Score >8 = High Risk for Opioid Abuse   Risk of substance use disorder (SUD): Low  Risk Mitigation Strategies:  Patient opioid safety counseling: No controlled substances prescribed. Patient-Prescriber Agreement (PPA): No agreement signed.  Controlled substance notification to other providers: None required. No opioid therapy.  Pharmacologic Plan: Non-opioid analgesic therapy offered.             Laboratory Chemistry Profile   Renal Lab Results  Component Value Date   BUN 17 11/19/2020   CREATININE 0.91 11/19/2020   GFRAA >60 02/02/2016   GFRNONAA >60 11/19/2020   SPECGRAV 1.015 11/22/2020   PHUR 6.5 11/22/2020   PROTEINUR Negative 11/22/2020     Electrolytes Lab Results  Component Value Date   NA 135 11/19/2020   K 4.4 11/19/2020   CL 100 11/19/2020   CALCIUM 9.0 11/19/2020   MG 1.6 (L) 06/15/2020     Hepatic Lab Results  Component Value Date   AST 23 11/19/2020   ALT 20 11/19/2020   ALBUMIN 3.8 11/19/2020   ALKPHOS 55 11/19/2020     ID No results found for: LYMEIGGIGMAB, HIV, SARSCOV2NAA, STAPHAUREUS, MRSAPCR, HCVAB, PREGTESTUR, RMSFIGG, QFVRPH1IGG, QFVRPH2IGG, [facility]   Bone Lab Results  Component Value Date   25OHVITD1 44 06/15/2020   25OHVITD2 <1.0 06/15/2020   25OHVITD3 43 06/15/2020     Endocrine Lab Results  Component Value Date   GLUCOSE 167 (H) 11/19/2020   GLUCOSEU 3+ (A) 11/22/2020   HGBA1C 6.6 (H) 01/31/2016   TSH 1.949 02/01/2016     Neuropathy Lab Results  Component Value Date   VITAMINB12 644 06/15/2020   FOLATE 33.0 02/01/2016   HGBA1C 6.6 (H) 01/31/2016     CNS No results found for: COLORCSF, APPEARCSF, RBCCOUNTCSF, WBCCSF, POLYSCSF, LYMPHSCSF, EOSCSF, PROTEINCSF, GLUCCSF,  JCVIRUS, CSFOLI, IGGCSF, LABACHR, ACETBL, LABACHR, ACETBL   Inflammation (CRP: Acute  ESR: Chronic) Lab Results  Component Value Date   CRP 0.6 06/15/2020   ESRSEDRATE 13 06/15/2020     Rheumatology No results found for: RF, ANA, LABURIC, URICUR, LYMEIGGIGMAB, LYMEABIGMQN, HLAB27   Coagulation Lab Results  Component Value Date   INR 0.90 01/30/2016   LABPROT 12.1 01/30/2016   APTT 30 01/30/2016   PLT 193 11/19/2020     Cardiovascular Lab Results  Component Value Date   TROPONINI <0.03 01/30/2016   HGB 11.7 (L) 11/19/2020   HCT 36.8 (L) 11/19/2020     Screening No results found for: SARSCOV2NAA, COVIDSOURCE, STAPHAUREUS, MRSAPCR, HCVAB, HIV, PREGTESTUR   Cancer No results found for: CEA, CA125, LABCA2   Allergens No results found for: ALMOND, APPLE, ASPARAGUS, AVOCADO, BANANA, BARLEY, BASIL, BAYLEAF, GREENBEAN, LIMABEAN, WHITEBEAN, BEEFIGE, REDBEET, BLUEBERRY, BROCCOLI, CABBAGE, MELON, CARROT, CASEIN, CASHEWNUT, CAULIFLOWER, CELERY     Note: Lab results reviewed.  Recent Diagnostic Imaging Review  Cervical Imaging: Cervical MR w/wo contrast: Results for orders placed during the hospital encounter of 11/13/19 MR CERVICAL SPINE W WO CONTRAST   Narrative CLINICAL DATA:  Initial evaluation for gait disturbance, balance difficulty. History of prior surgery and prostate cancer.   EXAM: MRI CERVICAL SPINE WITHOUT AND WITH CONTRAST   TECHNIQUE: Multiplanar and multiecho pulse sequences of the cervical spine, to include the craniocervical junction and cervicothoracic junction, were obtained without and with intravenous contrast.   CONTRAST:  73m GADAVIST GADOBUTROL 1 MMOL/ML IV SOLN   COMPARISON:  None available.   FINDINGS: Alignment: Straightening of the normal cervical lordosis. Trace anterolisthesis of C3 on C4, chronic and facet mediated.   Vertebrae: Susceptibility artifact related to prior ACDF at C5-C7 with solid arthrodesis. Vertebral body height  maintained without acute or chronic fracture. Bone marrow signal intensity within normal limits. Subcentimeter benign hemangioma noted within the C2 vertebral body. No worrisome  osseous lesions. No abnormal marrow edema or enhancement.   Cord: Normal signal and morphology.   Posterior Fossa, vertebral arteries, paraspinal tissues: Visualized brain and posterior fossa within normal limits. Craniocervical junction normal. Paraspinous and prevertebral soft tissues within normal limits. Normal intravascular flow voids seen within the vertebral arteries bilaterally.   Disc levels:   C2-C3: Negative interspace. Moderate right worse than left facet hypertrophy. No spinal stenosis. Mild bilateral C3 foraminal narrowing.   C3-C4: Mild disc bulge with uncovertebral hypertrophy. Moderate right worse than left facet hypertrophy. Mild ligamentum flavum redundancy. Mild spinal stenosis without cord deformity. Moderate left worse than right C4 foraminal stenosis.   C4-C5: Mild disc bulge with uncovertebral hypertrophy. Mild flattening and effacement of the ventral thecal sac. Moderate left with mild right facet hypertrophy. Mild spinal stenosis without cord deformity. Moderate left with mild right C5 foraminal stenosis.   C5-C6: Prior fusion. No residual spinal stenosis. Residual uncovertebral spurring with moderate left and mild to moderate right C6 foraminal narrowing.   C6-C7: Prior fusion. No residual spinal stenosis. Residual left-sided uncovertebral spurring with mild to moderate left C7 foraminal stenosis.   C7-T1: Minimal disc bulge. Bilateral facet hypertrophy. No significant canal stenosis. Foramina remain patent.   Visualized upper thoracic spine demonstrates no significant finding.   IMPRESSION: 1. Prior ACDF at C5-C7 without residual spinal stenosis. Persistent uncovertebral spurring with residual mild to moderate bilateral C6 and C7 foraminal stenosis as above. 2. Mild  disc bulging with uncovertebral and facet hypertrophy at C3-4 and C4-5 with resultant mild spinal stenosis but no cord deformity. Moderate left and mild right C4 and C5 foraminal narrowing. 3. Moderate multilevel facet arthrosis throughout the cervical spine as above, which could contribute to underlying neck pain.     Electronically Signed By: Jeannine Boga M.D. On: 11/14/2019 02:54   Lumbosacral Imaging: Lumbar MR wo contrast: Results for orders placed during the hospital encounter of 09/24/18 MR LUMBAR SPINE WO CONTRAST   Narrative CLINICAL DATA:  Chronic low back pain and stiffness with bilateral foot tingling and weakness. No recent injury.   EXAM: MRI LUMBAR SPINE WITHOUT CONTRAST   TECHNIQUE: Multiplanar, multisequence MR imaging of the lumbar spine was performed. No intravenous contrast was administered.   COMPARISON:  CT lumbar spine 08/21/2014.   FINDINGS: Segmentation:  Standard.   Alignment:  Maintained.   Vertebrae: No fracture, evidence of discitis, or worrisome bone lesion. Small Schmorl's node in the superior endplate of W80 and a small hemangioma in L1 are noted.   Conus medullaris and cauda equina: Conus extends to the L1 level. Conus and cauda equina appear normal.   Paraspinal and other soft tissues: Negative.   Disc levels:   T11-12 is imaged only in the sagittal plane and negative.   T12-L1: Mild facet degenerative disease.  Otherwise negative.   L1-2: Shallow disc bulge and mild ligament flavum thickening. There is mild central canal stenosis. The foramina are open.   L2-3: Shallow disc bulge with mild ligamentum flavum thickening and facet degenerative change. There is mild central canal and left foraminal narrowing. Right foramen open.   L3-4: Moderate ligamentum flavum thickening and facet degenerative disease. There is a disc bulge eccentrically prominent to the left with a more focal protrusion in foramen. Moderate to  moderately severe central canal stenosis is present with narrowing in the left subarticular recess and foramen which could impact the exiting left L3 and descending left roots. Moderate right foraminal narrowing is also seen.   L4-5: Left  laminotomy defect. Bilateral facet degenerative change is worse on the left. There is a shallow disc bulge to the central and foramina open.   L5-S1: Moderately severe bilateral facet degenerative change is worse on right. There is a shallow disc bulge without central canal stenosis. Mild to moderate foraminal narrowing is worse on right.   IMPRESSION: Spondylosis appears worst at L3-4 where moderate to moderately severe central canal stenosis is present. There is also marked left foraminal narrowing and narrowing in the left subarticular recess at this level which could impact both the exiting left L3 and descending left roots. Moderate right narrowing is present at this level.   Status post left laminotomy at L4-5. There is a shallow disc bulge to the left but no stenosis.   Multilevel facet degenerative disease is worst at L4-5 and S1.     Electronically Signed By: Inge Rise M.D. On: 09/24/2018 14:17   Lumbar MR w/wo contrast: Results for orders placed during the hospital encounter of 11/13/19 MR Lumbar Spine W Wo Contrast   Narrative CLINICAL DATA:  Chronic low back and bilateral leg pain.   EXAM: MRI LUMBAR SPINE WITHOUT AND WITH CONTRAST   TECHNIQUE: Multiplanar and multiecho pulse sequences of the lumbar spine were obtained without and with intravenous contrast.   CONTRAST:  54m GADAVIST GADOBUTROL 1 MMOL/ML IV SOLN   COMPARISON:  MRI lumbar spine dated September 24, 2018.   FINDINGS: Segmentation:  Standard.   Alignment:  Unchanged trace anterolisthesis at L4-L5.   Vertebrae: No fracture, evidence of discitis, or bone lesion. Enlarging Schmorl's node involving the L3 inferior endplate. Unchanged L1  hemangioma.   Conus medullaris and cauda equina: Conus extends to the L1-L2 level. Conus and cauda equina appear normal. No intradural enhancement.   Paraspinal and other soft tissues: Negative.   Disc levels:   T12-L1:  Negative.   L1-L2:  Negative.   L2-L3: Progressive mild disc bulging with prominent posterior epidural fat. Unchanged mild bilateral facet arthropathy. Progressive now moderate spinal canal stenosis. No neuroforaminal stenosis.   L3-L4: Unchanged diffuse disc bulging with superimposed left foraminal disc protrusion. Unchanged bilateral facet arthropathy. Unchanged moderate to severe spinal canal stenosis with severe left and moderate right neuroforaminal stenosis.   L4-L5: Prior left laminotomy. Unchanged shallow disc bulge eccentric to the left and mild bilateral facet arthropathy. No stenosis.   L5-S1: Negative disc. Unchanged moderate to severe bilateral facet arthropathy. Unchanged moderate bilateral neuroforaminal stenosis. No spinal canal stenosis.   IMPRESSION: 1. Multilevel degenerative changes of the lumbar spine as described above, progressed at L2-L3 with there is now moderate spinal canal stenosis. 2. Unchanged moderate to severe spinal canal stenosis and severe left neuroforaminal stenosis at L3-L4. 3. Unchanged moderate bilateral neuroforaminal stenosis at L5-S1.     Electronically Signed By: WTitus DubinM.D. On: 11/14/2019 16:30   Lumbar DG Bending views: Results for orders placed during the hospital encounter of 06/15/20 DG Lumbar Spine Complete W/Bend   Narrative CLINICAL DATA:  Low back pain.   EXAM: LUMBAR SPINE - COMPLETE WITH BENDING VIEWS   COMPARISON:  MRI November 13, 2019   FINDINGS: Trace retrolisthesis of L2 versus L3 on neutral, flexion, and extension imaging. No other malalignment. Lower lumbar facet degenerative changes. Multilevel degenerative disc disease, particularly in the lower thoracic spine and upper  lumbar spine. Anterior wedging of L2 is not significantly changed since April 2016. Anterior wedging of T12 is unchanged since November 2021. No other bony abnormalities.   Calcified atherosclerosis  in the abdominal aorta.   IMPRESSION: 1. Trace retrolisthesis L2-L2 versus L3 is stable. No other malalignment. 2. Degenerative changes as above. 3. Anterior wedging of T12 and L2 is not significantly changed since previous studies. 4. Calcified atherosclerosis in the abdominal aorta.     Electronically Signed By: Dorise Bullion III M.D On: 06/17/2020 17:04   Knee Imaging: Knee-L DG 4 views: Results for orders placed during the hospital encounter of 07/03/20 DG Knee Complete 4 Views Left   Narrative CLINICAL DATA:  Left knee pain.   EXAM: LEFT KNEE - COMPLETE 4+ VIEW   COMPARISON:  None.   FINDINGS: No fracture or dislocation. No joint effusion. Degenerative changes in the medial compartment are identified. There may be mild loss of joint space in the medial compartment. Minimal degenerative changes in the patellofemoral compartment.   IMPRESSION: 1. No acute abnormalities. 2. Degenerative changes as above, particularly in the medial and patellofemoral compartments.     Electronically Signed By: Dorise Bullion III M.D On: 07/03/2020 17:15  Meds   Current Outpatient Medications:    ACCU-CHEK AVIVA PLUS test strip, USE STRIP TO CHECK GLUCOSE ONCE DAILY, Disp: , Rfl: 0   albuterol (PROVENTIL HFA;VENTOLIN HFA) 108 (90 Base) MCG/ACT inhaler, Inhale into the lungs every 6 (six) hours as needed for wheezing or shortness of breath., Disp: , Rfl:    aspirin EC 81 MG tablet, Take 81 mg by mouth daily., Disp: , Rfl:    atorvastatin (LIPITOR) 40 MG tablet, TAKE 1 TABLET BY MOUTH ONCE DAILY, Disp: , Rfl:    calcium carbonate (OSCAL) 1500 (600 Ca) MG TABS tablet, Take by mouth 2 (two) times daily with a meal., Disp: , Rfl:    clopidogrel (PLAVIX) 75 MG tablet, Take 1 tablet (75  mg total) by mouth daily., Disp: 30 tablet, Rfl: 0   Cyanocobalamin (VITAMIN B-12 PO), Take by mouth., Disp: , Rfl:    DULoxetine (CYMBALTA) 60 MG capsule, , Disp: , Rfl:    finasteride (PROSCAR) 5 MG tablet, TAKE 1 TABLET EVERY DAY, Disp: 90 tablet, Rfl: 3   gabapentin (NEURONTIN) 300 MG capsule, , Disp: , Rfl:    glimepiride (AMARYL) 4 MG tablet, Take by mouth., Disp: , Rfl:    glyBURIDE-metformin (GLUCOVANCE) 5-500 MG per tablet, TAKE ONE TABLET BY MOUTH TWICE DAILY WITH MEALS, Disp: , Rfl:    INVOKANA 100 MG TABS tablet, Take 100 mg by mouth daily., Disp: , Rfl:    losartan (COZAAR) 100 MG tablet, Take 100 mg by mouth daily., Disp: , Rfl:    magnesium oxide (MAG-OX) 400 MG tablet, Take 400 mg by mouth 2 (two) times daily. , Disp: , Rfl:    Multiple Vitamin (MULTI-VITAMINS) TABS, Take 1 tablet by mouth daily. , Disp: , Rfl:    oxymetazoline (AFRIN) 0.05 % nasal spray, Place 1 spray into both nostrils 2 (two) times daily., Disp: , Rfl:    pantoprazole (PROTONIX) 40 MG tablet, Take by mouth., Disp: , Rfl:    pioglitazone (ACTOS) 15 MG tablet, TAKE 3 TABLETS ONE TIME DAILY, Disp: , Rfl:    Probiotic Product (PROBIOTIC PO), Take by mouth., Disp: , Rfl:    sildenafil (REVATIO) 20 MG tablet, 1-5 tablets as needed one hour prior to intercourse, Disp: 30 tablet, Rfl: 3   tamsulosin (FLOMAX) 0.4 MG CAPS capsule, Take 1 capsule (0.4 mg total) by mouth daily after supper., Disp: 90 capsule, Rfl: 3   umeclidinium-vilanterol (ANORO ELLIPTA) 62.5-25 MCG/INH AEPB, Inhale 1 puff  into the lungs daily., Disp: , Rfl:    donepezil (ARICEPT) 10 MG tablet, Take by mouth., Disp: , Rfl:   ROS  Constitutional: Denies any fever or chills Gastrointestinal: No reported hemesis, hematochezia, vomiting, or acute GI distress Musculoskeletal: Denies any acute onset joint swelling, redness, loss of ROM, or weakness Neurological: No reported episodes of acute onset apraxia, aphasia, dysarthria, agnosia, amnesia, paralysis,  loss of coordination, or loss of consciousness  Allergies  Mr. Catalfamo is allergic to hydrocodone and oxycodone-acetaminophen.  [place]  Drug: Mr. Arnold  reports no history of drug use. Alcohol:  reports current alcohol use of about 1.0 standard drink per week. Tobacco:  reports that he quit smoking about 47 years ago. His smoking use included cigarettes. He has never used smokeless tobacco. Medical:  has a past medical history of Allergy, Anemia, Arthritis, Back pain, Barrett esophagus, BPH (benign prostatic hyperplasia), Chronic kidney disease, COPD (chronic obstructive pulmonary disease) ([place]), Coronary artery disease, Depression, Diabetes mellitus without complication ([place]), Diverticulitis, Dysphagia, Esophageal reflux, Esophageal reflux, GERD (gastroesophageal reflux disease), Heart murmur, Hyperlipidemia, Hypersomnia, Hypertension, Kidney stones, Low testosterone, OAB (overactive bladder), Obesity, Presence of dental bridge, Rheumatic fever, Sleep apnea, Stroke ([place]) (01/2016), Vertigo, and Vitamin B12 deficiency. Surgical: Mr. Oestreicher  has a past surgical history that includes Lumbar spine surgery; Cervical spine surgery; Thoracic spine surgery; Colonoscopy; Esophagogastroduodenoscopy endoscopy; Carotid stent; Back surgery; Tonsillectomy; Coronary angioplasty; Cardiac catheterization (08/11/2014); Cardiac catheterization (N/A, 11/17/2014); Colonoscopy with propofol (N/A, 06/10/2015); Cataract extraction w/PHACO (Left, 04/03/2016); Cataract extraction w/PHACO (Right, 05/01/2016); Eye surgery; and Esophagogastroduodenoscopy (egd) with propofol (N/A, 06/28/2017). Family: family history includes Anesthesia problems in his father and mother; Prostate cancer in his brother; Stroke in his father.  Constitutional Exam  General appearance: Well nourished, well developed, and well hydrated. In no apparent acute distress Vitals:   12/07/20 0832  BP: (!) 146/99  Pulse: 92  Resp: 16  Temp: (!) 97.4 F  (36.3 C)  TempSrc: Temporal  SpO2: 97%  Weight: 260 lb (117.9 kg)  Height: _0  (1.651 m)   BMI Assessment: Estimated body mass index is 43.27 kg/m as calculated from the following:   Height as of this encounter: _1  (1.651 m).   Weight as of this encounter: 260 lb (117.9 kg).  BMI interpretation table: BMI level Category Range association with higher incidence of chronic pain  <18 kg/m2 Underweight   18.5-24.9 kg/m2 Ideal body weight   25-29.9 kg/m2 Overweight Increased incidence by 20%  30-34.9 kg/m2 Obese (Class I) Increased incidence by 68%  35-39.9 kg/m2 Severe obesity (Class II) Increased incidence by 136%  >40 kg/m2 Extreme obesity (Class III) Increased incidence by 254%   Patient's current BMI Ideal Body weight  Body mass index is 43.27 kg/m. Ideal body weight: 61.5 kg (135 lb 9.3 oz) Adjusted ideal body weight: 84.1 kg (185 lb 5.6 oz)   BMI Readings from Last 4 Encounters:  12/07/20 43.27 kg/m  11/22/20 43.27 kg/m  11/19/20 43.27 kg/m  11/04/20 42.68 kg/m   Wt Readings from Last 4 Encounters:  12/07/20 260 lb (117.9 kg)  11/22/20 260 lb (117.9 kg)  11/19/20 260 lb (117.9 kg)  11/04/20 256 lb 8 oz (116.3 kg)   Psych/Mental status: Alert, oriented x 3 (person, place, & time)       Eyes: PERLA Respiratory: No evidence of acute respiratory distress  Assessment & Plan  Primary Diagnosis & Pertinent Problem List: The primary encounter diagnosis was Lumbar facet joint pain (Bilateral). Diagnoses of Lumbosacral facet arthropathy,  Retrolisthesis of L2 over L3, DDD (degenerative disc disease), lumbosacral, Chronic low back pain (1ry area of Pain) (Bilateral) (R>L) w/o sciatica, Spondylosis without myelopathy or radiculopathy, lumbosacral region, Chronic anticoagulation (Plavix), Vertigo, Chronic, At high risk for falls, and Hypomagnesemia were also pertinent to this visit.  Visit Diagnosis: 1. Lumbar facet joint pain (Bilateral)   2. Lumbosacral facet arthropathy    3. Retrolisthesis of L2 over L3   4. DDD (degenerative disc disease), lumbosacral   5. Chronic low back pain (1ry area of Pain) (Bilateral) (R>L) w/o sciatica   6. Spondylosis without myelopathy or radiculopathy, lumbosacral region   7. Chronic anticoagulation (Plavix)   8. Vertigo, Chronic   9. At high risk for falls   10. Hypomagnesemia    Problems updated and reviewed during this visit: Problem  Retrolisthesis of L2 over L3   Stable as of 06/15/2020 flexion and extension x-rays.   Lumbosacral facet arthropathy (Multilevel) (Bilateral)  Vertigo, Chronic  At High Risk for Falls   Plan of Care  Pharmacotherapy (Medications Ordered): No orders of the defined types were placed in this encounter.  Procedure Orders         LUMBAR FACET(MEDIAL BRANCH NERVE BLOCK) MBNB     Lab Orders  No laboratory test(s) ordered today   Imaging Orders  No imaging studies ordered today   Referral Orders  No referral(s) requested today   Pharmacological management options:  Opioid Analgesics: I will not be prescribing any opioids at this time Membrane stabilizer: None prescribed at this time Muscle relaxant: None prescribed at this time NSAID: None prescribed at this time Other analgesic(s): None prescribed at this time      Interventional Therapies  Risk  Complexity Considerations:   Estimated body mass index is 43.27 kg/m as calculated from the following:   Height as of this encounter: _0  (1.651 m).   Weight as of this encounter: 260 lb (117.9 kg). PLAVIX Anticoagulation (Stop: 7-10 days  Restart: 2 hours)   Planned  Pending:   Diagnostic bilateral lumbar facet MBB #1    Under consideration:   Diagnostic bilateral lumbar facet block #1    Completed by Girtha Hake, MD: ([facility] PMR) Left L4 TFESI x1 (03/03/2020)  Left L5 TFESI x2 (01/14/2020; 02/11/2020)    Completed:   None at this time   Therapeutic  Palliative (PRN) options:   None established     Provider-requested follow-up: Return for (Clinic) procedure: (B) L-FCT Blk #1, (Sed-anx), (Blood Thinner Protocol). Recent Visits No visits were found meeting these conditions. Showing recent visits within past 90 days and meeting all other requirements Today's Visits Date Type Provider Dept  12/07/20 Office Visit Milinda Pointer, MD [facility]  Showing today's visits and meeting all other requirements Future Appointments No visits were found meeting these conditions. Showing future appointments within next 90 days and meeting all other requirements Primary Care Physician: Juluis Pitch, MD Note by: Gaspar Cola, MD Date: 12/07/2020; Time: 10:24 AM

## 2020-12-07 ENCOUNTER — Ambulatory Visit: Payer: Medicare HMO | Attending: Pain Medicine | Admitting: Pain Medicine

## 2020-12-07 ENCOUNTER — Other Ambulatory Visit: Payer: Self-pay

## 2020-12-07 ENCOUNTER — Encounter: Payer: Self-pay | Admitting: Pain Medicine

## 2020-12-07 ENCOUNTER — Telehealth: Payer: Self-pay

## 2020-12-07 VITALS — BP 146/99 | HR 92 | Temp 97.4°F | Resp 16 | Ht 65.0 in | Wt 260.0 lb

## 2020-12-07 DIAGNOSIS — Z7901 Long term (current) use of anticoagulants: Secondary | ICD-10-CM | POA: Diagnosis not present

## 2020-12-07 DIAGNOSIS — M4310 Spondylolisthesis, site unspecified: Secondary | ICD-10-CM | POA: Insufficient documentation

## 2020-12-07 DIAGNOSIS — M47817 Spondylosis without myelopathy or radiculopathy, lumbosacral region: Secondary | ICD-10-CM | POA: Diagnosis not present

## 2020-12-07 DIAGNOSIS — M545 Low back pain, unspecified: Secondary | ICD-10-CM | POA: Diagnosis not present

## 2020-12-07 DIAGNOSIS — G8929 Other chronic pain: Secondary | ICD-10-CM | POA: Insufficient documentation

## 2020-12-07 DIAGNOSIS — R42 Dizziness and giddiness: Secondary | ICD-10-CM | POA: Insufficient documentation

## 2020-12-07 DIAGNOSIS — M5459 Other low back pain: Secondary | ICD-10-CM | POA: Insufficient documentation

## 2020-12-07 DIAGNOSIS — M5137 Other intervertebral disc degeneration, lumbosacral region: Secondary | ICD-10-CM | POA: Diagnosis not present

## 2020-12-07 DIAGNOSIS — Z9181 History of falling: Secondary | ICD-10-CM | POA: Insufficient documentation

## 2020-12-07 NOTE — Progress Notes (Signed)
Secure message sent to PCP Youlanda Roys. Lovie Macadamia, MD regarding approval to pause Plavix 75mg  7-10 days prior to Lumbar Facet Block procedure.   Al Decant, RN

## 2020-12-07 NOTE — Progress Notes (Signed)
Safety precautions to be maintained throughout the outpatient stay will include: orient to surroundings, keep bed in low position, maintain call bell within reach at all times, provide assistance with transfer out of bed and ambulation.  

## 2020-12-07 NOTE — Patient Instructions (Addendum)
____________________________________________________________________________________________  Blood Thinners  IMPORTANT NOTICE:  If you take any of these, make sure to notify the nursing staff.  Failure to do so may result in injury.  Recommended time intervals to stop and restart blood-thinners, before & after invasive procedures  Generic Name Brand Name Pre-procedure. Stop this long before procedure. Post-procedure. Minimum waiting period before restarting.  Abciximab Reopro 15 days 2 hrs  Alteplase Activase 10 days 10 days  Anagrelide Agrylin    Apixaban Eliquis 3 days 6 hrs  Cilostazol Pletal 3 days 5 hrs  Clopidogrel Plavix 7-10 days 2 hrs  Dabigatran Pradaxa 5 days 6 hrs  Dalteparin Fragmin 24 hours 4 hrs  Dipyridamole Aggrenox 11days 2 hrs  Edoxaban Lixiana; Savaysa 3 days 2 hrs  Enoxaparin  Lovenox 24 hours 4 hrs  Eptifibatide Integrillin 8 hours 2 hrs  Fondaparinux  Arixtra 72 hours 12 hrs  Hydroxychloroquine Plaquenil 11 days   Prasugrel Effient 7-10 days 6 hrs  Reteplase Retavase 10 days 10 days  Rivaroxaban Xarelto 3 days 6 hrs  Ticagrelor Brilinta 5-7 days 6 hrs  Ticlopidine Ticlid 10-14 days 2 hrs  Tinzaparin Innohep 24 hours 4 hrs  Tirofiban Aggrastat 8 hours 2 hrs  Warfarin Coumadin 5 days 2 hrs   Other medications with blood-thinning effects  Product indications Generic (Brand) names Note  Cholesterol Lipitor Stop 4 days before procedure  Blood thinner (injectable) Heparin (LMW or LMWH Heparin) Stop 24 hours before procedure  Cancer Ibrutinib (Imbruvica) Stop 7 days before procedure  Malaria/Rheumatoid Hydroxychloroquine (Plaquenil) Stop 11 days before procedure  Thrombolytics  10 days before or after procedures   Over-the-counter (OTC) Products with blood-thinning effects  Product Common names Stop Time  Aspirin > 325 mg Goody Powders, Excedrin, etc. 11 days  Aspirin ? 81 mg  7 days  Fish oil  4 days  Garlic supplements  7 days  Ginkgo biloba  36  hours  Ginseng  24 hours  NSAIDs Ibuprofen, Naprosyn, etc. 3 days  Vitamin E  4 days   ____________________________________________________________________________________________ ______________________________________________________________________  Preparing for Procedure with Sedation  NOTICE: Due to recent regulatory changes, starting on August 08, 2020, procedures requiring intravenous (IV) sedation will no longer be performed at the [facility].  These types of procedures are required to be performed at [facility] ambulatory surgery facility.  We are very sorry for the inconvenience.  Procedure appointments are limited to planned procedures: No Prescription Refills. No disability issues will be discussed. No medication changes will be discussed.  Instructions: Oral Intake: Do not eat or drink anything for at least 8 hours prior to your procedure. (Exception: Blood Pressure Medication. See below.) Transportation: A driver is required. You may not drive yourself after the procedure. Blood Pressure Medicine: Do not forget to take your blood pressure medicine with a sip of water the morning of the procedure. If your Diastolic (lower reading) is above 100 mmHg, elective cases will be cancelled/rescheduled. Blood thinners: These will need to be stopped for procedures. Notify our staff if you are taking any blood thinners. Depending on which one you take, there will be specific instructions on how and when to stop it. Diabetics on insulin: Notify the staff so that you can be scheduled 1st case in the morning. If your diabetes requires high dose insulin, take only  of your normal insulin dose the morning of the procedure and notify the staff that you have done so. Preventing infections: Shower with an antibacterial soap the morning of your procedure.  Build-up your immune system: Take 1000 mg of Vitamin C with every meal (3 times a day) the day prior to your procedure. Antibiotics: Inform  the staff if you have a condition or reason that requires you to take antibiotics before dental procedures. Pregnancy: If you are pregnant, call and cancel the procedure. Sickness: If you have a cold, fever, or any active infections, call and cancel the procedure. Arrival: You must be in the facility at least 30 minutes prior to your scheduled procedure. Children: Do not bring children with you. Dress appropriately: Bring dark clothing that you would not mind if they get stained. Valuables: Do not bring any jewelry or valuables.  Reasons to call and reschedule or cancel your procedure: (Following these recommendations will minimize the risk of a serious complication.) Surgeries: Avoid having procedures within 2 weeks of any surgery. (Avoid for 2 weeks before or after any surgery). Flu Shots: Avoid having procedures within 2 weeks of a flu shots. (Avoid for 2 weeks before or after immunizations). Barium: Avoid having a procedure within 7-10 days after having had a radiological study involving the use of radiological contrast. (Myelograms, Barium swallow or enema study). Heart attacks: Avoid any elective procedures or surgeries for the initial 6 months after a "Myocardial Infarction" (Heart Attack). Blood thinners: It is imperative that you stop these medications before procedures. Let us know if you if you take any blood thinner.  Infection: Avoid procedures during or within two weeks of an infection (including chest colds or gastrointestinal problems). Symptoms associated with infections include: Localized redness, fever, chills, night sweats or profuse sweating, burning sensation when voiding, cough, congestion, stuffiness, runny nose, sore throat, diarrhea, nausea, vomiting, cold or Flu symptoms, recent or current infections. It is specially important if the infection is over the area that we intend to treat. Heart and lung problems: Symptoms that may suggest an active cardiopulmonary problem  include: cough, chest pain, breathing difficulties or shortness of breath, dizziness, ankle swelling, uncontrolled high or unusually low blood pressure, and/or palpitations. If you are experiencing any of these symptoms, cancel your procedure and contact your primary care physician for an evaluation.  Remember:  Regular Business hours are:  Monday to Thursday 8:00 AM to 4:00 PM  Provider's Schedule: Milinda Pointer, MD:  Procedure days: Tuesday and Thursday 7:30 AM to 4:00 PM  Gillis Santa, MD:  Procedure days: Monday and Wednesday 7:30 AM to 4:00 PM ______________________________________________________________________  ____________________________________________________________________________________________  General Risks and Possible Complications  Patient Responsibilities: It is important that you read this as it is part of your informed consent. It is our duty to inform you of the risks and possible complications associated with treatments offered to you. It is your responsibility as a patient to read this and to ask questions about anything that is not clear or that you believe was not covered in this document.  Patient's Rights: You have the right to refuse treatment. You also have the right to change your mind, even after initially having agreed to have the treatment done. However, under this last option, if you wait until the last second to change your mind, you may be charged for the materials used up to that point.  Introduction: Medicine is not an Chief Strategy Officer. Everything in Medicine, including the lack of treatment(s), carries the potential for danger, harm, or loss (which is by definition: Risk). In Medicine, a complication is a secondary problem, condition, or disease that can aggravate an already existing one. All treatments carry the  risk of possible complications. The fact that a side effects or complications occurs, does not imply that the treatment was conducted  incorrectly. It must be clearly understood that these can happen even when everything is done following the highest safety standards.  No treatment: You can choose not to proceed with the proposed treatment alternative. The "PRO(s)" would include: avoiding the risk of complications associated with the therapy. The "CON(s)" would include: not getting any of the treatment benefits. These benefits fall under one of three categories: diagnostic; therapeutic; and/or palliative. Diagnostic benefits include: getting information which can ultimately lead to improvement of the disease or symptom(s). Therapeutic benefits are those associated with the successful treatment of the disease. Finally, palliative benefits are those related to the decrease of the primary symptoms, without necessarily curing the condition (example: decreasing the pain from a flare-up of a chronic condition, such as incurable terminal cancer).  General Risks and Complications: These are associated to most interventional treatments. They can occur alone, or in combination. They fall under one of the following six (6) categories: no benefit or worsening of symptoms; bleeding; infection; nerve damage; allergic reactions; and/or death. No benefits or worsening of symptoms: In Medicine there are no guarantees, only probabilities. No healthcare provider can ever guarantee that a medical treatment will work, they can only state the probability that it may. Furthermore, there is always the possibility that the condition may worsen, either directly, or indirectly, as a consequence of the treatment. Bleeding: This is more common if the patient is taking a blood thinner, either prescription or over the counter (example: Goody Powders, Fish oil, Aspirin, Garlic, etc.), or if suffering a condition associated with impaired coagulation (example: Hemophilia, cirrhosis of the liver, low platelet counts, etc.). However, even if you do not have one on these, it can  still happen. If you have any of these conditions, or take one of these drugs, make sure to notify your treating physician. Infection: This is more common in patients with a compromised immune system, either due to disease (example: diabetes, cancer, human immunodeficiency virus [HIV], etc.), or due to medications or treatments (example: therapies used to treat cancer and rheumatological diseases). However, even if you do not have one on these, it can still happen. If you have any of these conditions, or take one of these drugs, make sure to notify your treating physician. Nerve Damage: This is more common when the treatment is an invasive one, but it can also happen with the use of medications, such as those used in the treatment of cancer. The damage can occur to small secondary nerves, or to large primary ones, such as those in the spinal cord and brain. This damage may be temporary or permanent and it may lead to impairments that can range from temporary numbness to permanent paralysis and/or brain death. Allergic Reactions: Any time a substance or material comes in contact with our body, there is the possibility of an allergic reaction. These can range from a mild skin rash (contact dermatitis) to a severe systemic reaction (anaphylactic reaction), which can result in death. Death: In general, any medical intervention can result in death, most of the time due to an unforeseen complication. ____________________________________________________________________________________________   Facet Blocks Patient Information  Description: The facets are joints in the spine between the vertebrae.  Like any joints in the body, facets can become irritated and painful.  Arthritis can also effect the facets.  By injecting steroids and local anesthetic in and around these joints,  we can temporarily block the nerve supply to them.  Steroids act directly on irritated nerves and tissues to reduce selling and  inflammation which often leads to decreased pain.  Facet blocks may be done anywhere along the spine from the neck to the low back depending upon the location of your pain.   After numbing the skin with local anesthetic (like Novocaine), a small needle is passed onto the facet joints under x-ray guidance.  You may experience a sensation of pressure while this is being done.  The entire block usually lasts about 15-25 minutes.   Conditions which may be treated by facet blocks:  Low back/buttock pain Neck/shoulder pain Certain types of headaches  Preparation for the injection:  Do not eat any solid food or dairy products within 8 hours of your appointment. You may drink clear liquid up to 3 hours before appointment.  Clear liquids include water, black coffee, juice or soda.  No milk or cream please. You may take your regular medication, including pain medications, with a sip of water before your appointment.  Diabetics should hold regular insulin (if taken separately) and take 1/2 normal NPH dose the morning of the procedure.  Carry some sugar containing items with you to your appointment. A driver must accompany you and be prepared to drive you home after your procedure. Bring all your current medications with you. An IV may be inserted and sedation may be given at the discretion of the physician. A blood pressure cuff, EKG and other monitors will often be applied during the procedure.  Some patients may need to have extra oxygen administered for a short period. You will be asked to provide medical information, including your allergies and medications, prior to the procedure.  We must know immediately if you are taking blood thinners (like Coumadin/Warfarin) or if you are allergic to IV iodine contrast (dye).  We must know if you could possible be pregnant.  Possible side-effects:  Bleeding from needle site Infection (rare, may require surgery) Nerve injury (rare) Numbness & tingling  (temporary) Difficulty urinating (rare, temporary) Spinal headache (a headache worse with upright posture) Light-headedness (temporary) Pain at injection site (serveral days) Decreased blood pressure (rare, temporary) Weakness in arm/leg (temporary) Pressure sensation in back/neck (temporary)   Call if you experience:  Fever/chills associated with headache or increased back/neck pain Headache worsened by an upright position New onset, weakness or numbness of an extremity below the injection site Hives or difficulty breathing (go to the emergency room) Inflammation or drainage at the injection site(s) Severe back/neck pain greater than usual New symptoms which are concerning to you  Please note:  Although the local anesthetic injected can often make your back or neck feel good for several hours after the injection, the pain will likely return. It takes 3-7 days for steroids to work.  You may not notice any pain relief for at least one week.  If effective, we will often do a series of 2-3 injections spaced 3-6 weeks apart to maximally decrease your pain.  After the initial series, you may be a candidate for a more permanent nerve block of the facets.  If you have any questions, please call #[phone]) [facility]

## 2020-12-08 ENCOUNTER — Encounter: Payer: Medicare HMO | Admitting: Physical Therapy

## 2020-12-08 ENCOUNTER — Telehealth: Payer: Self-pay

## 2020-12-08 ENCOUNTER — Telehealth: Payer: Self-pay | Admitting: *Deleted

## 2020-12-08 NOTE — Progress Notes (Signed)
Clearance Request faxed to [facility] @ [place] fax number [phone].   Al Decant, RN

## 2020-12-08 NOTE — Telephone Encounter (Signed)
Patient contacted, informed that facet block has been approved His wife spoke with Dr. Reuel Boom office this a.m., received permission to stop Plavix 7 days.  Permission request faxed to Dr. Reuel Boom office.

## 2020-12-08 NOTE — Telephone Encounter (Signed)
I have the authorization for the lumbar facets. Check out says blood thinner protocol. Has anyone sent the request to stop blood thinners

## 2020-12-08 NOTE — Telephone Encounter (Signed)
He is scheduled for 12/20/20

## 2020-12-09 DIAGNOSIS — M79672 Pain in left foot: Secondary | ICD-10-CM | POA: Diagnosis not present

## 2020-12-09 DIAGNOSIS — I739 Peripheral vascular disease, unspecified: Secondary | ICD-10-CM | POA: Diagnosis not present

## 2020-12-09 DIAGNOSIS — M25475 Effusion, left foot: Secondary | ICD-10-CM | POA: Diagnosis not present

## 2020-12-09 DIAGNOSIS — S92322A Displaced fracture of second metatarsal bone, left foot, initial encounter for closed fracture: Secondary | ICD-10-CM | POA: Diagnosis not present

## 2020-12-09 DIAGNOSIS — R262 Difficulty in walking, not elsewhere classified: Secondary | ICD-10-CM | POA: Diagnosis not present

## 2020-12-15 ENCOUNTER — Encounter: Payer: Medicare HMO | Admitting: Physical Therapy

## 2020-12-16 DIAGNOSIS — I10 Essential (primary) hypertension: Secondary | ICD-10-CM | POA: Diagnosis not present

## 2020-12-16 DIAGNOSIS — E1169 Type 2 diabetes mellitus with other specified complication: Secondary | ICD-10-CM | POA: Diagnosis not present

## 2020-12-16 DIAGNOSIS — E1140 Type 2 diabetes mellitus with diabetic neuropathy, unspecified: Secondary | ICD-10-CM | POA: Diagnosis not present

## 2020-12-16 DIAGNOSIS — E785 Hyperlipidemia, unspecified: Secondary | ICD-10-CM | POA: Diagnosis not present

## 2020-12-20 ENCOUNTER — Ambulatory Visit (HOSPITAL_BASED_OUTPATIENT_CLINIC_OR_DEPARTMENT_OTHER): Payer: Medicare HMO | Admitting: Pain Medicine

## 2020-12-20 ENCOUNTER — Encounter: Payer: Self-pay | Admitting: Pain Medicine

## 2020-12-20 ENCOUNTER — Other Ambulatory Visit: Payer: Self-pay

## 2020-12-20 ENCOUNTER — Ambulatory Visit
Admission: RE | Admit: 2020-12-20 | Discharge: 2020-12-20 | Disposition: A | Discharge status: Home / Self Care | Payer: Medicare HMO | Source: Ambulatory Visit | Attending: Pain Medicine | Admitting: Pain Medicine

## 2020-12-20 VITALS — BP 154/82 | HR 74 | Temp 97.1°F | Resp 15 | Ht 65.0 in | Wt 258.0 lb

## 2020-12-20 DIAGNOSIS — G8929 Other chronic pain: Secondary | ICD-10-CM

## 2020-12-20 DIAGNOSIS — Z7901 Long term (current) use of anticoagulants: Secondary | ICD-10-CM | POA: Insufficient documentation

## 2020-12-20 DIAGNOSIS — M5459 Other low back pain: Secondary | ICD-10-CM

## 2020-12-20 DIAGNOSIS — R937 Abnormal findings on diagnostic imaging of other parts of musculoskeletal system: Secondary | ICD-10-CM

## 2020-12-20 DIAGNOSIS — M4310 Spondylolisthesis, site unspecified: Secondary | ICD-10-CM | POA: Diagnosis not present

## 2020-12-20 DIAGNOSIS — M47816 Spondylosis without myelopathy or radiculopathy, lumbar region: Secondary | ICD-10-CM | POA: Diagnosis not present

## 2020-12-20 DIAGNOSIS — M5137 Other intervertebral disc degeneration, lumbosacral region: Secondary | ICD-10-CM

## 2020-12-20 DIAGNOSIS — M47817 Spondylosis without myelopathy or radiculopathy, lumbosacral region: Secondary | ICD-10-CM | POA: Diagnosis not present

## 2020-12-20 MED ORDER — ROPIVACAINE HCL 2 MG/ML IJ SOLN
INTRAMUSCULAR | Status: AC
  Filled 2020-12-20: qty 20

## 2020-12-20 MED ORDER — LIDOCAINE HCL 2 % IJ SOLN
20.0000 mL | Freq: Once | INTRAMUSCULAR | Status: AC
  Administered 2020-12-20: 200 mg

## 2020-12-20 MED ORDER — PENTAFLUOROPROP-TETRAFLUOROETH EX AERO
INHALATION_SPRAY | Freq: Once | CUTANEOUS | Status: AC
  Administered 2020-12-20: 30 via TOPICAL
  Filled 2020-12-20: qty 116

## 2020-12-20 MED ORDER — TRIAMCINOLONE ACETONIDE 40 MG/ML IJ SUSP
INTRAMUSCULAR | Status: AC
  Filled 2020-12-20: qty 2

## 2020-12-20 MED ORDER — MIDAZOLAM HCL 5 MG/5ML IJ SOLN
0.5000 mg | Freq: Once | INTRAMUSCULAR | Status: AC
  Administered 2020-12-20: 2 mg via INTRAVENOUS
  Filled 2020-12-20: qty 5

## 2020-12-20 MED ORDER — LACTATED RINGERS IV SOLN
1000.0000 mL | Freq: Once | INTRAVENOUS | Status: AC
  Administered 2020-12-20: 1000 mL via INTRAVENOUS

## 2020-12-20 MED ORDER — TRIAMCINOLONE ACETONIDE 40 MG/ML IJ SUSP
80.0000 mg | Freq: Once | INTRAMUSCULAR | Status: AC
  Administered 2020-12-20: 80 mg

## 2020-12-20 MED ORDER — LIDOCAINE HCL 2 % IJ SOLN
INTRAMUSCULAR | Status: AC
  Filled 2020-12-20: qty 10

## 2020-12-20 MED ORDER — ROPIVACAINE HCL 2 MG/ML IJ SOLN
18.0000 mL | Freq: Once | INTRAMUSCULAR | Status: AC
  Administered 2020-12-20: 18 mL via PERINEURAL

## 2020-12-20 NOTE — Progress Notes (Signed)
PROVIDER NOTE: Information contained herein reflects review and annotations entered in association with encounter. Interpretation of such information and data should be left to medically-trained personnel. Information provided to patient can be located elsewhere in the medical record under "Patient Instructions". Document created using STT-dictation technology, any transcriptional errors that may result from process are unintentional.    Patient: Taylor Austin  Service Category: Procedure Provider: Gaspar Cola, MD DOB: [birth year] DOS: 12/20/2020 Location: [facility] MRN: [id number] Setting: Ambulatory - outpatient Referring Provider: Milinda Pointer, MD Type: Established Patient Specialty: Interventional Pain Management PCP: Juluis Pitch, MD  Primary Reason for Visit: Interventional Pain Management Treatment. CC: Back Pain (low)    Procedure:          Type: Lumbar Facet, Medial Branch Block(s)          Primary Purpose: Diagnostic/Therapeutic Region: Posterolateral Lumbosacral Spine Level: L2, L3, L4, L5, & S1 Medial Branch Level(s). Injecting these levels blocks the L3-4, L4-5, and L5-S1 lumbar facet joints. Laterality: Bilateral Anesthesia: Local (1-2% Lidocaine)  Anxiolysis: None  Sedation: None  Guidance: Fluoroscopy          Indications: 1. Retrolisthesis of L2 over L3   2. Lumbar facet joint syndrome   3. Lumbosacral facet arthropathy (Multilevel) (Bilateral)   4. Spondylosis without myelopathy or radiculopathy, lumbosacral region   5. Lumbar facet joint pain (Bilateral)   6. DDD (degenerative disc disease), lumbosacral   7. Chronic low back pain (1ry area of Pain) (Bilateral) (R>L) w/o sciatica   8. Abnormal MRI, lumbar spine (11/14/2019)   9. Chronic anticoagulation (Plavix)    Pain Score: Pre-procedure: 7 /10 Post-procedure: 0-No pain/10     Position: Prone  Pre-op H&P Assessment:  Taylor Austin is a 80 y.o. (year old), male patient,  seen today for interventional treatment. He  has a past surgical history that includes Lumbar spine surgery; Cervical spine surgery; Thoracic spine surgery; Colonoscopy; Esophagogastroduodenoscopy endoscopy; Carotid stent; Back surgery; Tonsillectomy; Coronary angioplasty; Cardiac catheterization (08/11/2014); Cardiac catheterization (N/A, 11/17/2014); Colonoscopy with propofol (N/A, 06/10/2015); Cataract extraction w/PHACO (Left, 04/03/2016); Cataract extraction w/PHACO (Right, 05/01/2016); Eye surgery; and Esophagogastroduodenoscopy (egd) with propofol (N/A, 06/28/2017). Taylor Austin has a current medication list which includes the following prescription(s): accu-chek aviva plus, albuterol, aspirin ec, atorvastatin, calcium carbonate, clopidogrel, cyanocobalamin, donepezil, duloxetine, finasteride, gabapentin, glimepiride, glyburide-metformin, invokana, losartan, magnesium oxide, multi-vitamins, oxymetazoline, pantoprazole, pioglitazone, probiotic product, sildenafil, tamsulosin, and umeclidinium-vilanterol. His primarily concern today is the Back Pain (low)  Initial Vital Signs:  Pulse/HCG Rate: 74ECG Heart Rate: 87 Temp: (!) 97.1 F (36.2 C) Resp: 18 BP: (!) 151/77 SpO2: 97 %  BMI: Estimated body mass index is 42.93 kg/m as calculated from the following:   Height as of this encounter: 5\' 5"  (1.651 m).   Weight as of this encounter: 258 lb (117 kg).  Risk Assessment: Allergies: Reviewed. He is allergic to hydrocodone and oxycodone-acetaminophen.  Allergy Precautions: None required Coagulopathies: Reviewed. None identified.  Blood-thinner therapy: None at this time Active Infection(s): Reviewed. None identified. Taylor Austin is afebrile  Site Confirmation: Taylor Austin was asked to confirm the procedure and laterality before marking the site Procedure checklist: Completed Consent: Before the procedure and under the influence of no sedative(s), amnesic(s), or anxiolytics, the patient was informed  of the treatment options, risks and possible complications. To fulfill our ethical and legal obligations, as recommended by the American Medical Association's Code of Ethics, I have informed the patient of my clinical impression; the nature and purpose of the  treatment or procedure; the risks, benefits, and possible complications of the intervention; the alternatives, including doing nothing; the risk(s) and benefit(s) of the alternative treatment(s) or procedure(s); and the risk(s) and benefit(s) of doing nothing. The patient was provided information about the general risks and possible complications associated with the procedure. These may include, but are not limited to: failure to achieve desired goals, infection, bleeding, organ or nerve damage, allergic reactions, paralysis, and death. In addition, the patient was informed of those risks and complications associated to Spine-related procedures, such as failure to decrease pain; infection (i.e.: Meningitis, epidural or intraspinal abscess); bleeding (i.e.: epidural hematoma, subarachnoid hemorrhage, or any other type of intraspinal or peri-dural bleeding); organ or nerve damage (i.e.: Any type of peripheral nerve, nerve root, or spinal cord injury) with subsequent damage to sensory, motor, and/or autonomic systems, resulting in permanent pain, numbness, and/or weakness of one or several areas of the body; allergic reactions; (i.e.: anaphylactic reaction); and/or death. Furthermore, the patient was informed of those risks and complications associated with the medications. These include, but are not limited to: allergic reactions (i.e.: anaphylactic or anaphylactoid reaction(s)); adrenal axis suppression; blood sugar elevation that in diabetics may result in ketoacidosis or comma; water retention that in patients with history of congestive heart failure may result in shortness of breath, pulmonary edema, and decompensation with resultant heart failure; weight  gain; swelling or edema; medication-induced neural toxicity; particulate matter embolism and blood vessel occlusion with resultant organ, and/or nervous system infarction; and/or aseptic necrosis of one or more joints. Finally, the patient was informed that Medicine is not an exact science; therefore, there is also the possibility of unforeseen or unpredictable risks and/or possible complications that may result in a catastrophic outcome. The patient indicated having understood very clearly. We have given the patient no guarantees and we have made no promises. Enough time was given to the patient to ask questions, all of which were answered to the patient's satisfaction. Mr. Skoczylas has indicated that he wanted to continue with the procedure. Attestation: I, the ordering provider, attest that I have discussed with the patient the benefits, risks, side-effects, alternatives, likelihood of achieving goals, and potential problems during recovery for the procedure that I have provided informed consent. Date  Time: 12/20/2020  9:14 AM  Pre-Procedure Preparation:  Monitoring: As per clinic protocol. Respiration, ETCO2, SpO2, BP, heart rate and rhythm monitor placed and checked for adequate function Safety Precautions: Patient was assessed for positional comfort and pressure points before starting the procedure. Time-out: I initiated and conducted the "Time-out" before starting the procedure, as per protocol. The patient was asked to participate by confirming the accuracy of the "Time Out" information. Verification of the correct person, site, and procedure were performed and confirmed by me, the nursing staff, and the patient. "Time-out" conducted as per Joint Commission's Universal Protocol (UP.01.01.01). Time: 2376  Description of Procedure:          Laterality: Bilateral. The procedure was performed in identical fashion on both sides. Levels:  L2, L3, L4, L5, & S1 Medial Branch Level(s) Area Prepped:  Posterior Lumbosacral Region DuraPrep (Iodine Povacrylex [0.7% available iodine] and Isopropyl Alcohol, 74% w/w) Safety Precautions: Aspiration looking for blood return was conducted prior to all injections. At no point did we inject any substances, as a needle was being advanced. Before injecting, the patient was told to immediately notify me if he was experiencing any new onset of "ringing in the ears, or metallic taste in the mouth". No attempts  were made at seeking any paresthesias. Safe injection practices and needle disposal techniques used. Medications properly checked for expiration dates. SDV (single dose vial) medications used. After the completion of the procedure, all disposable equipment used was discarded in the proper designated medical waste containers. Local Anesthesia: Protocol guidelines were followed. The patient was positioned over the fluoroscopy table. The area was prepped in the usual manner. The time-out was completed. The target area was identified using fluoroscopy. A 12-in long, straight, sterile hemostat was used with fluoroscopic guidance to locate the targets for each level blocked. Once located, the skin was marked with an approved surgical skin marker. Once all sites were marked, the skin (epidermis, dermis, and hypodermis), as well as deeper tissues (fat, connective tissue and muscle) were infiltrated with a small amount of a short-acting local anesthetic, loaded on a 10cc syringe with a 25G, 1.5-in  Needle. An appropriate amount of time was allowed for local anesthetics to take effect before proceeding to the next step. Local Anesthetic: Lidocaine 2.0% The unused portion of the local anesthetic was discarded in the proper designated containers. Technical explanation of process:  L2 Medial Branch Nerve Block (MBB): The target area for the L2 medial branch is at the junction of the postero-lateral aspect of the superior articular process and the superior, posterior, and medial  edge of the transverse process of L3. Under fluoroscopic guidance, a Quincke needle was inserted until contact was made with os over the superior postero-lateral aspect of the pedicular shadow (target area). After negative aspiration for blood, 0.5 mL of the nerve block solution was injected without difficulty or complication. The needle was removed intact. L3 Medial Branch Nerve Block (MBB): The target area for the L3 medial branch is at the junction of the postero-lateral aspect of the superior articular process and the superior, posterior, and medial edge of the transverse process of L4. Under fluoroscopic guidance, a Quincke needle was inserted until contact was made with os over the superior postero-lateral aspect of the pedicular shadow (target area). After negative aspiration for blood, 0.5 mL of the nerve block solution was injected without difficulty or complication. The needle was removed intact. L4 Medial Branch Nerve Block (MBB): The target area for the L4 medial branch is at the junction of the postero-lateral aspect of the superior articular process and the superior, posterior, and medial edge of the transverse process of L5. Under fluoroscopic guidance, a Quincke needle was inserted until contact was made with os over the superior postero-lateral aspect of the pedicular shadow (target area). After negative aspiration for blood, 0.5 mL of the nerve block solution was injected without difficulty or complication. The needle was removed intact. L5 Medial Branch Nerve Block (MBB): The target area for the L5 medial branch is at the junction of the postero-lateral aspect of the superior articular process and the superior, posterior, and medial edge of the sacral ala. Under fluoroscopic guidance, a Quincke needle was inserted until contact was made with os over the superior postero-lateral aspect of the pedicular shadow (target area). After negative aspiration for blood, 0.5 mL of the nerve block solution  was injected without difficulty or complication. The needle was removed intact. S1 Medial Branch Nerve Block (MBB): The target area for the S1 medial branch is at the posterior and inferior 6 o'clock position of the L5-S1 facet joint. Under fluoroscopic guidance, the Quincke needle inserted for the L5 MBB was redirected until contact was made with os over the inferior and postero aspect of  the sacrum, at the 6 o' clock position under the L5-S1 facet joint (Target area). After negative aspiration for blood, 0.5 mL of the nerve block solution was injected without difficulty or complication. The needle was removed intact.  Nerve block solution: 0.2% PF-Ropivacaine + Triamcinolone (40 mg/mL) diluted to a final concentration of 4 mg of Triamcinolone/mL of Ropivacaine The unused portion of the solution was discarded in the proper designated containers. Procedural Needles: 22-gauge, 3.5-inch, Quincke needles used for all levels.  Once the entire procedure was completed, the treated area was cleaned, making sure to leave some of the prepping solution back to take advantage of its long term bactericidal properties.      Illustration of the posterior view of the lumbar spine and the posterior neural structures. Laminae of L2 through S1 are labeled. DPRL5, dorsal primary ramus of L5; DPRS1, dorsal primary ramus of S1; DPR3, dorsal primary ramus of L3; FJ, facet (zygapophyseal) joint L3-L4; I, inferior articular process of L4; LB1, lateral branch of dorsal primary ramus of L1; IAB, inferior articular branches from L3 medial branch (supplies L4-L5 facet joint); IBP, intermediate branch plexus; MB3, medial branch of dorsal primary ramus of L3; NR3, third lumbar nerve root; S, superior articular process of L5; SAB, superior articular branches from L4 (supplies L4-5 facet joint also); TP3, transverse process of L3.  Vitals:   12/20/20 0952 12/20/20 0957 12/20/20 1001 12/20/20 1012  BP: (!) 155/90 (!) 139/93 (!)  148/82 (!) 154/82  Pulse:      Resp: 16 18 16 15   Temp:      SpO2: 95% 95% 95% 98%  Weight:      Height:         Start Time: 0952 hrs. End Time: 1001 hrs.  Imaging Guidance (Spinal):          Type of Imaging Technique: Fluoroscopy Guidance (Spinal) Indication(s): Assistance in needle guidance and placement for procedures requiring needle placement in or near specific anatomical locations not easily accessible without such assistance. Exposure Time: Please see nurses notes. Contrast: None used. Fluoroscopic Guidance: I was personally present during the use of fluoroscopy. "Tunnel Vision Technique" used to obtain the best possible view of the target area. Parallax error corrected before commencing the procedure. "Direction-depth-direction" technique used to introduce the needle under continuous pulsed fluoroscopy. Once target was reached, antero-posterior, oblique, and lateral fluoroscopic projection used confirm needle placement in all planes. Images permanently stored in EMR. Interpretation: No contrast injected. I personally interpreted the imaging intraoperatively. Adequate needle placement confirmed in multiple planes. Permanent images saved into the patient's record.  Antibiotic Prophylaxis:   Anti-infectives (From admission, onward)    None      Indication(s): None identified  Post-operative Assessment:  Post-procedure Vital Signs:  Pulse/HCG Rate: 7495 Temp: (!) 97.1 F (36.2 C) Resp: 15 BP: (!) 154/82 SpO2: 98 %  EBL: None  Complications: No immediate post-treatment complications observed by team, or reported by patient.  Note: The patient tolerated the entire procedure well. A repeat set of vitals were taken after the procedure and the patient was kept under observation following institutional policy, for this type of procedure. Post-procedural neurological assessment was performed, showing return to baseline, prior to discharge. The patient was provided with  post-procedure discharge instructions, including a section on how to identify potential problems. Should any problems arise concerning this procedure, the patient was given instructions to immediately contact us, at any time, without hesitation. In any case, we plan to contact the patient by  telephone for a follow-up status report regarding this interventional procedure.  Comments:  No additional relevant information.  Plan of Care  Orders:  Orders Placed This Encounter  Procedures   LUMBAR FACET(MEDIAL BRANCH NERVE BLOCK) MBNB    Scheduling Instructions:     Procedure: Lumbar facet block (AKA.: Lumbosacral medial branch nerve block)     Side: Bilateral     Level: L3-4, L4-5, & L5-S1 Facets (L2, L3, L4, L5, & S1 Medial Branch Nerves)     Sedation: Patient's choice.     Timeframe: Today    Order Specific Question:   Where will this procedure be performed?    Answer:   [facility]   DG PAIN [facility] C-ARM 1-60 MIN NO REPORT    Intraoperative interpretation by procedural physician at [place].    Standing Status:   Standing    Number of Occurrences:   1    Order Specific Question:   Reason for exam:    Answer:   Assistance in needle guidance and placement for procedures requiring needle placement in or near specific anatomical locations not easily accessible without such assistance.   Informed Consent Details: Physician/Practitioner Attestation; Transcribe to consent form and obtain patient signature    Nursing Order: Transcribe to consent form and obtain patient signature. Note: Always confirm laterality of pain with Mr. Puebla, before procedure.    Order Specific Question:   Physician/Practitioner attestation of informed consent for procedure/surgical case    Answer:   I, the physician/practitioner, attest that I have discussed with the patient the benefits, risks, side effects, alternatives, likelihood of achieving goals and potential problems during recovery for  the procedure that I have provided informed consent.    Order Specific Question:   Procedure    Answer:   Lumbar Facet Block  under fluoroscopic guidance    Order Specific Question:   Physician/Practitioner performing the procedure    Answer:   Dayton Sherr A. Dossie Arbour MD    Order Specific Question:   Indication/Reason    Answer:   Low Back Pain, with our without leg pain, due to Facet Joint Arthralgia (Joint Pain) Spondylosis (Arthritis of the Spine), without myelopathy or radiculopathy (Nerve Damage).   Care order/instruction: Please confirm that the patient has stopped the Plavix (Clopidogrel) x 7-10 days prior to procedure or surgery.    Please confirm that the patient has stopped the Plavix (Clopidogrel) x 7-10 days prior to procedure or surgery.    Standing Status:   Standing    Number of Occurrences:   1   Provide equipment / supplies at bedside    "Block Tray" (Disposable  single use) Needle type: SpinalSpinal Amount/quantity: 4 Size: Regular (3.5-inch) Gauge: 22G    Standing Status:   Standing    Number of Occurrences:   1    Order Specific Question:   Specify    Answer:   Block Tray   Bleeding precautions    Standing Status:   Standing    Number of Occurrences:   1    Chronic Opioid Analgesic:  None. No chronic opioid analgesics therapy prescribed by our practice. MME/day: 0 mg/day   Medications ordered for procedure: Meds ordered this encounter  Medications   lidocaine (XYLOCAINE) 2 % (with pres) injection 400 mg   pentafluoroprop-tetrafluoroeth (GEBAUERS) aerosol   lactated ringers infusion 1,000 mL   midazolam (VERSED) 5 MG/5ML injection 0.5-2 mg    Make sure Flumazenil is available in the pyxis when using this medication.  If oversedation occurs, administer 0.2 mg IV over 15 sec. If after 45 sec no response, administer 0.2 mg again over 1 min; may repeat at 1 min intervals; not to exceed 4 doses (1 mg)   ropivacaine (PF) 2 mg/mL (0.2%) (NAROPIN) injection 18 mL    triamcinolone acetonide (KENALOG-40) injection 80 mg    Medications administered: We administered lidocaine, pentafluoroprop-tetrafluoroeth, lactated ringers, midazolam, ropivacaine (PF) 2 mg/mL (0.2%), and triamcinolone acetonide.  See the medical record for exact dosing, route, and time of administration.  Follow-up plan:   Return in about 2 weeks (around 01/03/2021) for Proc-day (T,Th), (VV), (PPE).       Interventional Therapies  Risk  Complexity Considerations:   Estimated body mass index is 43.27 kg/m as calculated from the following:   Height as of this encounter: 5\' 5"  (1.651 m).   Weight as of this encounter: 260 lb (117.9 kg). PLAVIX Anticoagulation (Stop: 7-10 days  Restart: 2 hours)   Planned  Pending:   Diagnostic bilateral lumbar facet MBB #1    Under consideration:   Diagnostic bilateral lumbar facet block #1    Completed by Girtha Hake, MD: ([facility] PMR) Left L4 TFESI x1 (03/03/2020)  Left L5 TFESI x2 (01/14/2020; 02/11/2020)    Completed:   None at this time   Therapeutic  Palliative (PRN) options:   None established    Recent Visits Date Type Provider Dept  12/07/20 Office Visit Milinda Pointer, MD [facility]  Showing recent visits within past 90 days and meeting all other requirements Today's Visits Date Type Provider Dept  12/20/20 Procedure visit Milinda Pointer, MD [facility]  Showing today's visits and meeting all other requirements Future Appointments Date Type Provider Dept  01/03/21 Appointment Milinda Pointer, MD [facility]  Showing future appointments within next 90 days and meeting all other requirements Disposition: Discharge home  Discharge (Date  Time): 12/20/2020;   hrs.   Primary Care Physician: Juluis Pitch, MD Location: [facility] Note by: Gaspar Cola, MD Date: 12/20/2020; Time: 1:07 PM  Disclaimer:  Medicine is not an Armed forces logistics/support/administrative officer. The only guarantee in medicine is that nothing is guaranteed. It is important to note that the decision to proceed with this intervention was based on the information collected from the patient. The Data and conclusions were drawn from the patient's questionnaire, the interview, and the physical examination. Because the information was provided in large part by the patient, it cannot be guaranteed that it has not been purposely or unconsciously manipulated. Every effort has been made to obtain as much relevant data as possible for this evaluation. It is important to note that the conclusions that lead to this procedure are derived in large part from the available data. Always take into account that the treatment will also be dependent on availability of resources and existing treatment guidelines, considered by other Pain Management Practitioners as being common knowledge and practice, at the time of the intervention. For Medico-Legal purposes, it is also important to point out that variation in procedural techniques and pharmacological choices are the acceptable norm. The indications, contraindications, technique, and results of the above procedure should only be interpreted and judged by a Board-Certified Interventional Pain Specialist with extensive familiarity and expertise in the same exact procedure and technique.

## 2020-12-20 NOTE — Patient Instructions (Signed)

## 2020-12-20 NOTE — Progress Notes (Signed)
Safety precautions to be maintained throughout the outpatient stay will include: orient to surroundings, keep bed in low position, maintain call bell within reach at all times, provide assistance with transfer out of bed and ambulation.  

## 2020-12-21 ENCOUNTER — Telehealth: Payer: Self-pay

## 2020-12-21 NOTE — Telephone Encounter (Signed)
Post procedure phone call. Patient states he is doing well.  

## 2020-12-22 ENCOUNTER — Encounter: Payer: Medicare HMO | Admitting: Physical Therapy

## 2020-12-23 DIAGNOSIS — E785 Hyperlipidemia, unspecified: Secondary | ICD-10-CM | POA: Diagnosis not present

## 2020-12-23 DIAGNOSIS — E1140 Type 2 diabetes mellitus with diabetic neuropathy, unspecified: Secondary | ICD-10-CM | POA: Diagnosis not present

## 2020-12-23 DIAGNOSIS — E1159 Type 2 diabetes mellitus with other circulatory complications: Secondary | ICD-10-CM | POA: Diagnosis not present

## 2020-12-23 DIAGNOSIS — E1169 Type 2 diabetes mellitus with other specified complication: Secondary | ICD-10-CM | POA: Diagnosis not present

## 2020-12-23 DIAGNOSIS — I152 Hypertension secondary to endocrine disorders: Secondary | ICD-10-CM | POA: Diagnosis not present

## 2020-12-23 DIAGNOSIS — E669 Obesity, unspecified: Secondary | ICD-10-CM | POA: Diagnosis not present

## 2020-12-29 ENCOUNTER — Encounter: Payer: Medicare HMO | Admitting: Physical Therapy

## 2021-01-02 NOTE — Progress Notes (Signed)
Patient: Taylor Austin  Service Category: E/M  Provider: Gaspar Cola, MD  DOB: 1940-08-07  DOS: 01/03/2021  Location: Office  MRN: [id number]  Setting: Ambulatory outpatient  Referring Provider: Juluis Pitch, MD  Type: Established Patient  Specialty: Interventional Pain Management  PCP: Juluis Pitch, MD  Location: Remote location  Delivery: TeleHealth     Virtual Encounter - Pain Management PROVIDER NOTE: Information contained herein reflects review and annotations entered in association with encounter. Interpretation of such information and data should be left to medically-trained personnel. Information provided to patient can be located elsewhere in the medical record under "Patient Instructions". Document created using STT-dictation technology, any transcriptional errors that may result from process are unintentional.    Contact & Pharmacy Preferred: [phone] Home: [phone] (home) Mobile: [phone] (mobile) E-mail: [email or web address], [place] - [place] [address] Phone: [phone] Fax: [phone]  [facility] [place], [place] [address] [place] [address] Phone: [phone] Fax: [phone]  [facility] - [place], [place] [address] Phone: [phone] Fax: [phone]   Pre-screening  Taylor Austin offered "in-person" vs "virtual" encounter. He indicated preferring virtual for this encounter.   Reason COVID-19*   Social distancing based on CDC and AMA recommendations.   I contacted Taylor Austin on 01/03/2021 via telephone.      I clearly identified myself as Gaspar Cola, MD. I verified that I was speaking with the correct person using two identifiers (Name: Taylor Austin, and date of birth: [birth year]).  Consent I sought verbal advanced  consent from Taylor Austin for virtual visit interactions. I informed Taylor Austin of possible security and privacy concerns, risks, and limitations associated with providing "not-in-person" medical evaluation and management services. I also informed Taylor Austin of the availability of "in-person" appointments. Finally, I informed him that there would be a charge for the virtual visit and that he could be  personally, fully or partially, financially responsible for it. Taylor Austin expressed understanding and agreed to proceed.   Historic Elements   Taylor Austin is a 80 y.o. year old, male patient evaluated today after our last contact on 12/20/2020. Mr. Cato  has a past medical history of Allergy, Anemia, Arthritis, Back pain, Barrett esophagus, BPH (benign prostatic hyperplasia), Chronic kidney disease, COPD (chronic obstructive pulmonary disease) ([place]), Coronary artery disease, Depression, Diabetes mellitus without complication ([place]), Diverticulitis, Dysphagia, Esophageal reflux, Esophageal reflux, GERD (gastroesophageal reflux disease), Heart murmur, Hyperlipidemia, Hypersomnia, Hypertension, Kidney stones, Low testosterone, OAB (overactive bladder), Obesity, Presence of dental bridge, Rheumatic fever, Sleep apnea, Stroke ([place]) (01/2016), Vertigo, and Vitamin B12 deficiency. He also  has a past surgical history that includes Lumbar spine surgery; Cervical spine surgery; Thoracic spine surgery; Colonoscopy; Esophagogastroduodenoscopy endoscopy; Carotid stent; Back surgery; Tonsillectomy; Coronary angioplasty; Cardiac catheterization (08/11/2014); Cardiac catheterization (N/A, 11/17/2014); Colonoscopy with propofol (N/A, 06/10/2015); Cataract extraction w/PHACO (Left, 04/03/2016); Cataract extraction w/PHACO (Right, 05/01/2016); Eye surgery; and Esophagogastroduodenoscopy (egd) with propofol (N/A, 06/28/2017). Taylor Austin has a current medication list which includes the following prescription(s):  accu-chek aviva plus, albuterol, aspirin ec, atorvastatin, calcium carbonate, clopidogrel, cyanocobalamin, donepezil, duloxetine, finasteride, gabapentin, glimepiride, glyburide-metformin, invokana, losartan, magnesium oxide, multi-vitamins, oxymetazoline, pantoprazole, pioglitazone, probiotic product, sildenafil, tamsulosin, and umeclidinium-vilanterol. He  reports that he quit smoking about 47 years ago. His smoking use included cigarettes. He has never used  smokeless tobacco. He reports current alcohol use of about 1.0 standard drink per week. He reports that he does not use drugs. Taylor Austin is allergic to hydrocodone and oxycodone-acetaminophen.   HPI  Today, he is being contacted for a post-procedure assessment.  On the patient's initial evaluation the patient had indicated to me that his primary area of pain was that of the lower back followed by his right lower extremity pain.  At the time of then initial evaluation on 06/15/2020 he clarified for me that initially he was having bilateral lower extremity pain, but in further questioning him, it would seem that at the time of the initial evaluation he was only having pain on the right lower extremity going all the way down to the top of the foot and what seem to be an L5 dermatomal distribution.  In the case of the low back pain, this pain was described as his primary area of pain, bilateral, with the right side being worse than the left.  Interestingly, the medical record reveals that on 01/14/2020 and 02/11/2020 he had 2 different left L5 transforaminal epidural steroid injections which he had referred to me had given him 100% relief of the pain in the left lower extremity, but no long-term benefit in a similar fashion he refers that the left L4 transforaminal epidural steroid injection that he had on 03/03/2020 did not give him any short-term or long-term relief of the pain, but as seen on the patient's initial evaluation on 06/15/2020, at that time he was  not having any left lower extremity pain and today he also refers not having any pain in the left leg except for his left foot pain secondary to a second toe fracture which occurred 2 days ago.  At this point, it is fairly clear to me that I need to be very careful extracting information from this patient since he apparently has procedural expectations that are not necessarily out of par with what we are intending to achieve with each 1 of these procedures.  In a similar fashion, when my nurses called him to get the postprocedure information from the bilateral lumbar facet block he indicated having had 100% relief of the pain for approximately 2 days and then the pain having come back.  However, as I ask him for details regarding his current pain, it turns out that he currently is having absolutely no low back pain meaning that he has attained 100% ongoing relief of the low back pain.  I did not proceeded to ask him about the pain in his right leg and he has indicated that he is not having any pain down the leg or into his foot (as he had described on 06/15/2020) meaning that he has attained 100% relief of the pain that seem to have been going down the L5 dermatomal distribution on the right side.  I then went on to ask him about the pain in the left leg and we kept on going back and forth with regards to pain that he was experiencing in his foot but not through the leg and it was not until approximately 10 minutes into our conversation and questioning that he decided to volunteer that 2 days ago he had fractured his second toe and that the pain that he was experiencing in the left foot was actually secondary to a fracture and nothing that he had prior to 2 days ago.  The patient indicates that the bilateral lumbar facet block has provided him with  100% relief of the pain for the duration of the local anesthetic which has in fact persisted past the duration of that local anesthetic and currently he is enjoying  100% relief of his low back pain and he is also not having any lower extremity pain except for pain on his left foot, but this is secondary to the fact that 2 days ago he broke his second toe.  Since he is currently having no pain, except for that of his recent fracture, I have encouraged him to give me a call whenever the low back pain returns.  He indicated that he would.  Post-procedure evaluation     Procedure:          Type: Lumbar Facet, Medial Branch Block(s) #1     Primary Purpose: Diagnostic/Therapeutic Region: Posterolateral Lumbosacral Spine Level: L2, L3, L4, L5, & S1 Medial Branch Level(s). Injecting these levels blocks the L3-4, L4-5, and L5-S1 lumbar facet joints. Laterality: Bilateral Anesthesia: Local (1-2% Lidocaine)  Anxiolysis: None  Sedation: None  Guidance: Fluoroscopy          Indications: 1. Retrolisthesis of L2 over L3   2. Lumbar facet joint syndrome   3. Lumbosacral facet arthropathy (Multilevel) (Bilateral)   4. Spondylosis without myelopathy or radiculopathy, lumbosacral region   5. Lumbar facet joint pain (Bilateral)   6. DDD (degenerative disc disease), lumbosacral   7. Chronic low back pain (1ry area of Pain) (Bilateral) (R>L) w/o sciatica   8. Abnormal MRI, lumbar spine (11/14/2019)   9. Chronic anticoagulation (Plavix)    Pain Score: Pre-procedure: 7 /10 Post-procedure: 0-No pain/10      Effectiveness:  Initial hour after procedure: 100 %. Subsequent 4-6 hours post-procedure: 100 %. Analgesia past initial 6 hours: 100 % (good pain relief x 2 days.  Continue to have pain in the legs, but it is not as bad as pre-procedure.). Ongoing improvement:  Analgesic: Upon further the patient indicates currently having an ongoing 100% relief of the low back pain and also having no lower extremity pain except for his left foot, but this is secondary to a broken second toe, which apparently just happened 2 days ago. Function: Taylor Austin reports improvement  in function ROM: Taylor Austin reports improvement in ROM  Pharmacotherapy Assessment   Opioid Analgesic: None. No chronic opioid analgesics therapy prescribed by our practice. MME/day: 0 mg/day   Monitoring: [place] PMP: PDMP reviewed during this encounter.       Pharmacotherapy: No side-effects or adverse reactions reported. Compliance: No problems identified. Effectiveness: Clinically acceptable. Plan: Refer to "POC". UDS:  Summary  Date Value Ref Range Status  06/15/2020 Note  Final    Comment:    ==================================================================== Compliance Drug Analysis, Ur ==================================================================== Test                             Result       Flag       Units  Drug Present and Declared for Prescription Verification   Gabapentin                     PRESENT      EXPECTED   Duloxetine                     PRESENT      EXPECTED  Drug Present not Declared for Prescription Verification   Acetaminophen  PRESENT      UNEXPECTED   Ibuprofen                      PRESENT      UNEXPECTED   Diphenhydramine                PRESENT      UNEXPECTED   Lidocaine                      PRESENT      UNEXPECTED  Drug Absent but Declared for Prescription Verification   Salicylate                     Not Detected UNEXPECTED    Aspirin, as indicated in the declared medication list, is not always    detected even when used as directed.  ==================================================================== Test                      Result    Flag   Units      Ref Range   Creatinine              34               mg/dL      >=20 ==================================================================== Declared Medications:  The flagging and interpretation on this report are based on the  following declared medications.  Unexpected results may arise from  inaccuracies in the declared medications.   **Note: The testing scope of  this panel includes these medications:   Duloxetine (Cymbalta)  Gabapentin (Neurontin)   **Note: The testing scope of this panel does not include small to  moderate amounts of these reported medications:   Aspirin   **Note: The testing scope of this panel does not include the  following reported medications:   Albuterol (Ventolin HFA)  Atorvastatin (Lipitor)  Calcium  Canagliflozin (Invokana)  Clopidogrel (Plavix)  Cyanocobalamin  Donepezil (Aricept)  Finasteride (Proscar)  Glimepiride (Amaryl)  Glyburide (Glucovance)  Losartan (Cozaar)  Magnesium (Mag-Ox)  Metformin (Glucovance)  Multivitamin  Oxymetazoline (Afrin)  Pantoprazole (Protonix)  Pioglitazone (Actos)  Probiotic  Sildenafil  Tamsulosin (Flomax)  Umeclidinium (Anoro)  Vilanterol (Anoro) ==================================================================== For clinical consultation, please call [phone]. ====================================================================      Laboratory Chemistry Profile   Renal Lab Results  Component Value Date   BUN 17 11/19/2020   CREATININE 0.91 11/19/2020   GFRAA >60 02/02/2016   GFRNONAA >60 11/19/2020    Hepatic Lab Results  Component Value Date   AST 23 11/19/2020   ALT 20 11/19/2020   ALBUMIN 3.8 11/19/2020   ALKPHOS 55 11/19/2020    Electrolytes Lab Results  Component Value Date   NA 135 11/19/2020   K 4.4 11/19/2020   CL 100 11/19/2020   CALCIUM 9.0 11/19/2020   MG 1.6 (L) 06/15/2020    Bone Lab Results  Component Value Date   25OHVITD1 44 06/15/2020   25OHVITD2 <1.0 06/15/2020   25OHVITD3 43 06/15/2020    Inflammation (CRP: Acute Phase) (ESR: Chronic Phase) Lab Results  Component Value Date   CRP 0.6 06/15/2020   ESRSEDRATE 13 06/15/2020         Note: Above Lab results reviewed.  Imaging  DG PAIN CLINIC C-ARM 1-60 MIN NO REPORT Fluoro was used, but no Radiologist interpretation will be provided.  Please refer to "NOTES"  tab for provider progress note.  Assessment  The primary encounter diagnosis was Lumbar  facet joint syndrome. Diagnoses of Chronic low back pain (1ry area of Pain) (Bilateral) (R>L) w/o sciatica, Retrolisthesis of L2 over L3, and Lumbar facet joint pain (Bilateral) were also pertinent to this visit.  Plan of Care  Problem-specific:  No problem-specific Assessment & Plan notes found for this encounter.  Taylor Austin has a current medication list which includes the following long-term medication(s): albuterol, atorvastatin, calcium carbonate, donepezil, duloxetine, gabapentin, glimepiride, glyburide-metformin, losartan, pantoprazole, and pioglitazone.  Pharmacotherapy (Medications Ordered): No orders of the defined types were placed in this encounter.  Orders:  No orders of the defined types were placed in this encounter.  Follow-up plan:   No follow-ups on file.     Interventional Therapies  Risk   Complexity Considerations:   Estimated body mass index is 43.27 kg/m as calculated from the following:   Height as of this encounter: _0  (1.651 m).   Weight as of this encounter: 260 lb (117.9 kg).  PLAVIX Anticoagulation (Stop: 7-10 days   Restart: 2 hours)   Planned   Pending:      Under consideration:   Diagnostic bilateral lumbar facet block #2    Completed by Girtha Hake, MD: ([facility]) Left L4 TFESI x1 (03/03/2020) (0/0/0/0) (however, currently having no left lower extremity pain)  Left L5 TFESI x2 (01/14/2020; 02/11/2020) (100/100/0/0) (however, currently having no left lower extremity pain)    Completed:   Diagnostic/therapeutic bilateral lumbar facet MBB x1 (12/20/2020) (100/100/100/100)    Therapeutic   Palliative (PRN) options:   None established    Recent Visits Date Type Provider Dept  12/20/20 Procedure visit Milinda Pointer, MD [facility]  12/07/20 Office Visit Milinda Pointer, MD [facility]  Showing recent  visits within past 90 days and meeting all other requirements Today's Visits Date Type Provider Dept  01/03/21 Office Visit Milinda Pointer, MD [facility]  Showing today's visits and meeting all other requirements Future Appointments No visits were found meeting these conditions. Showing future appointments within next 90 days and meeting all other requirements  I discussed the assessment and treatment plan with the patient. The patient was provided an opportunity to ask questions and all were answered. The patient agreed with the plan and demonstrated an understanding of the instructions.  Patient advised to call back or seek an in-person evaluation if the symptoms or condition worsens.  Duration of encounter: 25 minutes.  Note by: Gaspar Cola, MD Date: 01/03/2021; Time: 5:08 PM

## 2021-01-03 ENCOUNTER — Other Ambulatory Visit: Payer: Self-pay

## 2021-01-03 ENCOUNTER — Ambulatory Visit: Payer: Medicare HMO | Attending: Pain Medicine | Admitting: Pain Medicine

## 2021-01-03 ENCOUNTER — Telehealth: Payer: Self-pay

## 2021-01-03 DIAGNOSIS — M47816 Spondylosis without myelopathy or radiculopathy, lumbar region: Secondary | ICD-10-CM

## 2021-01-03 DIAGNOSIS — M545 Low back pain, unspecified: Secondary | ICD-10-CM

## 2021-01-03 DIAGNOSIS — M4310 Spondylolisthesis, site unspecified: Secondary | ICD-10-CM | POA: Diagnosis not present

## 2021-01-03 DIAGNOSIS — M5459 Other low back pain: Secondary | ICD-10-CM | POA: Diagnosis not present

## 2021-01-03 DIAGNOSIS — G8929 Other chronic pain: Secondary | ICD-10-CM

## 2021-01-03 NOTE — Telephone Encounter (Signed)
LM for patient to call office for pere virtual appointment questions.

## 2021-01-05 ENCOUNTER — Encounter: Payer: Medicare HMO | Admitting: Physical Therapy

## 2021-01-06 DIAGNOSIS — S92322D Displaced fracture of second metatarsal bone, left foot, subsequent encounter for fracture with routine healing: Secondary | ICD-10-CM | POA: Diagnosis not present

## 2021-01-06 DIAGNOSIS — I739 Peripheral vascular disease, unspecified: Secondary | ICD-10-CM | POA: Diagnosis not present

## 2021-01-06 DIAGNOSIS — M25475 Effusion, left foot: Secondary | ICD-10-CM | POA: Diagnosis not present

## 2021-01-06 DIAGNOSIS — M79672 Pain in left foot: Secondary | ICD-10-CM | POA: Diagnosis not present

## 2021-01-06 DIAGNOSIS — E1142 Type 2 diabetes mellitus with diabetic polyneuropathy: Secondary | ICD-10-CM | POA: Diagnosis not present

## 2021-01-06 DIAGNOSIS — R262 Difficulty in walking, not elsewhere classified: Secondary | ICD-10-CM | POA: Diagnosis not present

## 2021-01-12 ENCOUNTER — Encounter: Payer: Medicare HMO | Admitting: Physical Therapy

## 2021-01-23 DIAGNOSIS — I739 Peripheral vascular disease, unspecified: Secondary | ICD-10-CM | POA: Diagnosis not present

## 2021-01-23 DIAGNOSIS — R262 Difficulty in walking, not elsewhere classified: Secondary | ICD-10-CM | POA: Diagnosis not present

## 2021-01-23 DIAGNOSIS — M25475 Effusion, left foot: Secondary | ICD-10-CM | POA: Diagnosis not present

## 2021-01-23 DIAGNOSIS — M79672 Pain in left foot: Secondary | ICD-10-CM | POA: Diagnosis not present

## 2021-01-23 DIAGNOSIS — M5441 Lumbago with sciatica, right side: Secondary | ICD-10-CM | POA: Diagnosis not present

## 2021-01-23 DIAGNOSIS — S92322D Displaced fracture of second metatarsal bone, left foot, subsequent encounter for fracture with routine healing: Secondary | ICD-10-CM | POA: Diagnosis not present

## 2021-01-23 DIAGNOSIS — M792 Neuralgia and neuritis, unspecified: Secondary | ICD-10-CM | POA: Diagnosis not present

## 2021-01-23 DIAGNOSIS — M5442 Lumbago with sciatica, left side: Secondary | ICD-10-CM | POA: Diagnosis not present

## 2021-01-23 DIAGNOSIS — E1142 Type 2 diabetes mellitus with diabetic polyneuropathy: Secondary | ICD-10-CM | POA: Diagnosis not present

## 2021-02-28 NOTE — Progress Notes (Signed)
03/01/21 3:46 PM   Taylor Austin Jun 08, 1940 [id number]  Referring provider:  Juluis Pitch, MD [phone],  [place] [address] Chief Complaint  Patient presents with   Prostate Cancer     HPI: Taylor Austin is a 81 y.o.male with a personal history of prostate cancer, voiding dysfunction, OAB, who presents today for 3 month follow-up with IPSS and PVR.   His surgical pathology is consistent with intermediate Gleason 4+3 prostate cancer. He is s/p IMRT in 12/2017.    Prostate biopsy on 08/13/2017 revealed 2 cores positive of disease, 2% of the left base described as 4+3 prostate cancer as well as Gleason 3+3 at the left mid involving only 2%.  There is also a suspicious lesion in the left apex but this was nondiagnostic.   UDS 12/2017  indicated that he may also have dysfunctional voiding and OAB. Previously tried multiple medications including anticholinergics, finasteride, Myrbetriq, PTNS without any improvement.  He is accompanied by his wife today. He reports that he has been feeling dizzy he reports that he took his blood pressure medicine today. He reports lower back pain that radiates down his leg. He reports that Viagra did not help with his erections.    He worked with PT x 1 only and then stopped due to need for additional medical work up.     IPSS     Row Name 03/01/21 1500         International Prostate Symptom Score   How often have you had the sensation of not emptying your bladder? Almost always     How often have you had to urinate less than every two hours? About half the time     How often have you found you stopped and started again several times when you urinated? Almost always     How often have you found it difficult to postpone urination? Almost always     How often have you had a weak urinary stream? Almost always     How often have you had to strain to start urination? Less than 1 in 5 times     How many times did you typically get up  at night to urinate? 4 Times     Total IPSS Score 28       Quality of Life due to urinary symptoms   If you were to spend the rest of your life with your urinary condition just the way it is now how would you feel about that? Mostly Disatisfied              Score:  1-7 Mild 8-19 Moderate 20-35 Severe  PMH: Past Medical History:  Diagnosis Date   Allergy    Anemia    Arthritis    Back pain    Barrett esophagus    BPH (benign prostatic hyperplasia)    Chronic kidney disease    COPD (chronic obstructive pulmonary disease) (HCC)    Coronary artery disease    Depression    Diabetes mellitus without complication (HCC)    Diverticulitis    Dysphagia    Esophageal reflux    Esophageal reflux    GERD (gastroesophageal reflux disease)    Heart murmur    Hyperlipidemia    Hypersomnia    Hypertension    Kidney stones    Low testosterone    OAB (overactive bladder)    Obesity    Presence of dental bridge    2 - top  Rheumatic fever    Sleep apnea    BiPAP   Stroke ([place]) 01/2016   Vertigo    Vitamin B12 deficiency     Surgical History: Past Surgical History:  Procedure Laterality Date   BACK SURGERY     CARDIAC CATHETERIZATION  08/11/2014   Procedure: Left Heart Cath and Coronary Angiography;  Surgeon: Teodoro Spray, MD;  Location: [facility];  Service: Cardiovascular;;   CARDIAC CATHETERIZATION N/A 11/17/2014   Procedure: Right Heart Cath;  Surgeon: Teodoro Spray, MD;  Location: [facility];  Service: Cardiovascular;  Laterality: N/A;   CAROTID STENT     CATARACT EXTRACTION W/PHACO Left 04/03/2016   Procedure: CATARACT EXTRACTION PHACO AND INTRAOCULAR LENS PLACEMENT ([place]) left diabetic;  Surgeon: Eulogio Bear, MD;  Location: [facility];  Service: Ophthalmology;  Laterality: Left;  diabetic - oral meds sleep apnea   CATARACT EXTRACTION W/PHACO Right 05/01/2016   Procedure: CATARACT EXTRACTION PHACO AND INTRAOCULAR LENS PLACEMENT  ([place]) Right diabetic;  Surgeon: Eulogio Bear, MD;  Location: [place] [facility];  Service: Ophthalmology;  Laterality: Right;  Diabetic oral meds sleep apnea   CERVICAL SPINE SURGERY     COLONOSCOPY     COLONOSCOPY WITH PROPOFOL N/A 06/10/2015   Procedure: COLONOSCOPY WITH PROPOFOL;  Surgeon: Manya Silvas, MD;  [facility] END[facility]COPY;  Service: Endoscopy;  Laterality: N/A;   CORONARY ANGIOPLASTY     ESOPHAGOGASTRODUODENOSCOPY (EGD) WITH PROPOFOL N/A 06/28/2017   Procedure: ESOPHAGOGASTRODUODENOSCOPY (EGD) WITH PROPOFOL;  Surgeon: Manya Silvas, MD;  Location: [facility] ENDOSCOPY;  Service: Endoscopy;  Laterality: N/A;   ESOPHAGOGASTRODUODENOSCOPY ENDOSCOPY     EYE SURGERY     LUMBAR SPINE SURGERY     THORACIC SPINE SURGERY     TONSILLECTOMY      Home Medications:  Allergies as of 03/01/2021       Reactions   Hydrocodone Shortness Of Breath   Oxycodone-acetaminophen Shortness Of Breath        Medication List        Accurate as of March 01, 2021  3:46 PM. If you have any questions, ask your nurse or doctor.          STOP taking these medications    glyBURIDE-metformin 5-500 MG tablet Commonly known as: GLUCOVANCE Stopped by: Hollice Espy, MD   umeclidinium-vilanterol 62.5-25 MCG/INH Aepb Commonly known as: Celedonio Miyamoto Stopped by: Hollice Espy, MD       TAKE these medications    Accu-Chek Aviva Plus test strip Generic drug: glucose blood USE STRIP TO CHECK GLUCOSE ONCE DAILY   albuterol 108 (90 Base) MCG/ACT inhaler Commonly known as: VENTOLIN HFA Inhale into the lungs every 6 (six) hours as needed for wheezing or shortness of breath.   aspirin EC 81 MG tablet Take 81 mg by mouth daily.   atorvastatin 40 MG tablet Commonly known as: LIPITOR TAKE 1 TABLET BY MOUTH ONCE DAILY   calcium carbonate 1500 (600 Ca) MG Tabs tablet Commonly known as: OSCAL Take by mouth 2 (two) times daily with a meal.   clopidogrel 75 MG tablet Commonly known  as: PLAVIX Take 1 tablet (75 mg total) by mouth daily.   donepezil 10 MG tablet Commonly known as: ARICEPT Take by mouth.   DULoxetine 60 MG capsule Commonly known as: CYMBALTA   finasteride 5 MG tablet Commonly known as: PROSCAR TAKE 1 TABLET EVERY DAY   gabapentin 300 MG capsule Commonly known as: NEURONTIN   glimepiride 4 MG  tablet Commonly known as: AMARYL Take by mouth.   Invokana 100 MG Tabs tablet Generic drug: canagliflozin Take 100 mg by mouth daily.   losartan 100 MG tablet Commonly known as: COZAAR Take 100 mg by mouth daily.   magnesium oxide 400 MG tablet Commonly known as: MAG-OX Take 400 mg by mouth 2 (two) times daily.   metFORMIN 500 MG 24 hr tablet Commonly known as: GLUCOPHAGE-XR Take 1,000 mg by mouth 2 (two) times daily.   Multi-Vitamins Tabs Take 1 tablet by mouth daily.   oxymetazoline 0.05 % nasal spray Commonly known as: AFRIN Place 1 spray into both nostrils 2 (two) times daily.   pantoprazole 40 MG tablet Commonly known as: PROTONIX Take by mouth.   pioglitazone 15 MG tablet Commonly known as: ACTOS TAKE 3 TABLETS ONE TIME DAILY   PROBIOTIC PO Take by mouth.   sildenafil 20 MG tablet Commonly known as: REVATIO 1-5 tablets as needed one hour prior to intercourse   Spiriva Respimat 2.5 MCG/ACT Aers Generic drug: Tiotropium Bromide Monohydrate SMARTSIG:2 Spray(s) By Mouth Daily   tamsulosin 0.4 MG Caps capsule Commonly known as: FLOMAX Take 1 capsule (0.4 mg total) by mouth daily after supper.   Trulicity 1.5 XV/4.0GQ Sopn Generic drug: Dulaglutide Inject into the skin.   VITAMIN B-12 PO Take by mouth.        Allergies:  Allergies  Allergen Reactions   Hydrocodone Shortness Of Breath   Oxycodone-Acetaminophen Shortness Of Breath    Family History: Family History  Problem Relation Age of Onset   Stroke Father    Anesthesia problems Father    Anesthesia problems Mother    Prostate cancer Brother     Kidney disease Neg Hx    Bladder Cancer Neg Hx     Social History:  reports that he quit smoking about 47 years ago. His smoking use included cigarettes. He has never used smokeless tobacco. He reports current alcohol use of about 1.0 standard drink per week. He reports that he does not use drugs.   Physical Exam: BP (!) 91/51    Pulse 85    Ht 5\' 5"  (1.651 m)    Wt 250 lb (113.4 kg)    BMI 41.60 kg/m   Constitutional:  Alert and oriented, No acute distress. HEENT: [place] AT, moist mucus membranes.  Trachea midline, no masses. Cardiovascular: No clubbing, cyanosis, or edema. Respiratory: Normal respiratory effort, no increased work of breathing. Skin: No rashes, bruises or suspicious lesions. Neurologic: Grossly intact, no focal deficits, moving all 4 extremities. Psychiatric: Normal mood and affect.  Laboratory Data:  Lab Results  Component Value Date   CREATININE 0.91 11/19/2020     Pertinent Imaging: Results for orders placed or performed in visit on 03/01/21  BLADDER SCAN AMB NON-IMAGING  Result Value Ref Range   Scan Result 2 ml      Assessment & Plan:    Prostate cancer ([place])  - Status post ADT x6 months with XRT - PSA has remained undetectable  - PSA; pending   2. OAB  - Severe urinary symptoms has failed maximal medical therapy previously failed all anticholinergics - He is emptying adequately today  - Strongly recommend and encourage he continue pelvic floor therapy and Kegel exercises, he and his wife agree to f/u on this   3. Hypotension  - Recommend he skip his blood pressure medicine tonight and recheck his blood pressure at home  - If he continues to feel dizziness or his blood pressure  remains low I advise him to got to the ER.   F/u 6 mo  I,Kailey Littlejohn,acting as a scribe for Hollice Espy, MD.,have documented all relevant documentation on the behalf of Hollice Espy, MD,as directed by  Hollice Espy, MD while in the presence of Hollice Espy,  MD.  I have reviewed the above documentation for accuracy and completeness, and I agree with the above.   Hollice Espy, MD   [facility] [address] [place], [place] [address] [phone]

## 2021-03-01 ENCOUNTER — Ambulatory Visit: Payer: Medicare HMO | Admitting: Urology

## 2021-03-01 ENCOUNTER — Other Ambulatory Visit: Payer: Self-pay

## 2021-03-01 VITALS — BP 91/51 | HR 85 | Ht 65.0 in | Wt 250.0 lb

## 2021-03-01 DIAGNOSIS — C61 Malignant neoplasm of prostate: Secondary | ICD-10-CM

## 2021-03-01 LAB — BLADDER SCAN AMB NON-IMAGING: Scan Result: 2

## 2021-03-26 ENCOUNTER — Other Ambulatory Visit: Payer: Self-pay | Admitting: Urology

## 2021-03-29 DIAGNOSIS — Z0100 Encounter for examination of eyes and vision without abnormal findings: Secondary | ICD-10-CM | POA: Diagnosis not present

## 2021-03-29 DIAGNOSIS — E119 Type 2 diabetes mellitus without complications: Secondary | ICD-10-CM | POA: Diagnosis not present

## 2021-04-18 DIAGNOSIS — E1140 Type 2 diabetes mellitus with diabetic neuropathy, unspecified: Secondary | ICD-10-CM | POA: Diagnosis not present

## 2021-04-24 DIAGNOSIS — E669 Obesity, unspecified: Secondary | ICD-10-CM | POA: Diagnosis not present

## 2021-04-24 DIAGNOSIS — E1159 Type 2 diabetes mellitus with other circulatory complications: Secondary | ICD-10-CM | POA: Diagnosis not present

## 2021-04-24 DIAGNOSIS — E1169 Type 2 diabetes mellitus with other specified complication: Secondary | ICD-10-CM | POA: Diagnosis not present

## 2021-04-24 DIAGNOSIS — E1140 Type 2 diabetes mellitus with diabetic neuropathy, unspecified: Secondary | ICD-10-CM | POA: Diagnosis not present

## 2021-04-24 DIAGNOSIS — I152 Hypertension secondary to endocrine disorders: Secondary | ICD-10-CM | POA: Diagnosis not present

## 2021-04-25 DIAGNOSIS — M549 Dorsalgia, unspecified: Secondary | ICD-10-CM | POA: Diagnosis not present

## 2021-04-25 DIAGNOSIS — J449 Chronic obstructive pulmonary disease, unspecified: Secondary | ICD-10-CM | POA: Diagnosis not present

## 2021-04-25 DIAGNOSIS — Z1389 Encounter for screening for other disorder: Secondary | ICD-10-CM | POA: Diagnosis not present

## 2021-04-25 DIAGNOSIS — Z6841 Body Mass Index (BMI) 40.0 and over, adult: Secondary | ICD-10-CM | POA: Diagnosis not present

## 2021-04-25 DIAGNOSIS — Z0000 Encounter for general adult medical examination without abnormal findings: Secondary | ICD-10-CM | POA: Diagnosis not present

## 2021-04-25 DIAGNOSIS — G4730 Sleep apnea, unspecified: Secondary | ICD-10-CM | POA: Diagnosis not present

## 2021-04-25 DIAGNOSIS — N400 Enlarged prostate without lower urinary tract symptoms: Secondary | ICD-10-CM | POA: Diagnosis not present

## 2021-04-25 DIAGNOSIS — I2510 Atherosclerotic heart disease of native coronary artery without angina pectoris: Secondary | ICD-10-CM | POA: Diagnosis not present

## 2021-04-25 DIAGNOSIS — Z23 Encounter for immunization: Secondary | ICD-10-CM | POA: Diagnosis not present

## 2021-04-25 DIAGNOSIS — I10 Essential (primary) hypertension: Secondary | ICD-10-CM | POA: Diagnosis not present

## 2021-04-25 DIAGNOSIS — E1140 Type 2 diabetes mellitus with diabetic neuropathy, unspecified: Secondary | ICD-10-CM | POA: Diagnosis not present

## 2021-04-25 DIAGNOSIS — K219 Gastro-esophageal reflux disease without esophagitis: Secondary | ICD-10-CM | POA: Diagnosis not present

## 2021-06-12 ENCOUNTER — Emergency Department
Admission: EM | Admit: 2021-06-12 | Discharge: 2021-06-12 | Discharge status: Absent without leave | Payer: Medicare HMO | Source: Home / Self Care

## 2021-06-12 DIAGNOSIS — M25562 Pain in left knee: Secondary | ICD-10-CM | POA: Diagnosis not present

## 2021-06-12 DIAGNOSIS — G8929 Other chronic pain: Secondary | ICD-10-CM | POA: Diagnosis not present

## 2021-06-18 NOTE — Progress Notes (Unsigned)
PROVIDER NOTE: Information contained herein reflects review and annotations entered in association with encounter. Interpretation of such information and data should be left to medically-trained personnel. Information provided to patient can be located elsewhere in the medical record under "Patient Instructions". Document created using STT-dictation technology, any transcriptional errors that may result from process are unintentional.    Patient: Taylor Austin  Service Category: E/M  Provider: Gaspar Cola, MD  DOB: 1940/11/10  DOS: 06/19/2021  Specialty: Interventional Pain Management  MRN: [id number]  Setting: Ambulatory outpatient  PCP: Juluis Pitch, MD  Type: Established Patient    Referring Provider: Juluis Pitch, MD  Location: Office  Delivery: Face-to-face     HPI  Taylor Austin, a 81 y.o. year old male, is here today because of his No primary diagnosis found.. Taylor Austin primary complain today is No chief complaint on file. Last encounter: My last encounter with him was on 01/03/2021. Pertinent problems: Taylor Austin has Arthritis of knee; Gait instability; Chronic low back pain (1ry area of Pain) (Bilateral) (R>L) w/o sciatica; Lumbar stenosis with neurogenic claudication; Chronic pain syndrome; Abnormal MRI, cervical spine (11/14/2019); Abnormal MRI, lumbar spine (11/14/2019); History of lumbar laminectomy; History of fusion of cervical spine; DDD (degenerative disc disease), cervical; DDD (degenerative disc disease), lumbosacral; Lumbosacral facet arthropathy (Multilevel) (Bilateral); Cervical facet hypertrophy; DJD (degenerative joint disease) of cervical spine; Lumbar facet joint pain (Bilateral); Spondylosis without myelopathy or radiculopathy, lumbosacral region; Retrolisthesis of L2 over L3; and Lumbar facet joint syndrome on their pertinent problem list. Pain Assessment: Severity of   is reported as a  /10. Location:    / . Onset:  . Quality:  . Timing:  .  Modifying factor(s):  Marland Kitchen Vitals:  vitals were not taken for this visit.   Reason for encounter:  *** . ***  Pharmacotherapy Assessment  Analgesic: None. No chronic opioid analgesics therapy prescribed by our practice. MME/day: 0 mg/day   Monitoring: [place] PMP: PDMP reviewed during this encounter.       Pharmacotherapy: No side-effects or adverse reactions reported. Compliance: No problems identified. Effectiveness: Clinically acceptable.  No notes on file  UDS:  Summary  Date Value Ref Range Status  06/15/2020 Note  Final    Comment:    ==================================================================== Compliance Drug Analysis, Ur ==================================================================== Test                             Result       Flag       Units  Drug Present and Declared for Prescription Verification   Gabapentin                     PRESENT      EXPECTED   Duloxetine                     PRESENT      EXPECTED  Drug Present not Declared for Prescription Verification   Acetaminophen                  PRESENT      UNEXPECTED   Ibuprofen                      PRESENT      UNEXPECTED   Diphenhydramine                PRESENT      UNEXPECTED   Lidocaine  PRESENT      UNEXPECTED  Drug Absent but Declared for Prescription Verification   Salicylate                     Not Detected UNEXPECTED    Aspirin, as indicated in the declared medication list, is not always    detected even when used as directed.  ==================================================================== Test                      Result    Flag   Units      Ref Range   Creatinine              34               mg/dL      >=20 ==================================================================== Declared Medications:  The flagging and interpretation on this report are based on the  following declared medications.  Unexpected results may arise from  inaccuracies in the declared  medications.   **Note: The testing scope of this panel includes these medications:   Duloxetine (Cymbalta)  Gabapentin (Neurontin)   **Note: The testing scope of this panel does not include small to  moderate amounts of these reported medications:   Aspirin   **Note: The testing scope of this panel does not include the  following reported medications:   Albuterol (Ventolin HFA)  Atorvastatin (Lipitor)  Calcium  Canagliflozin (Invokana)  Clopidogrel (Plavix)  Cyanocobalamin  Donepezil (Aricept)  Finasteride (Proscar)  Glimepiride (Amaryl)  Glyburide (Glucovance)  Losartan (Cozaar)  Magnesium (Mag-Ox)  Metformin (Glucovance)  Multivitamin  Oxymetazoline (Afrin)  Pantoprazole (Protonix)  Pioglitazone (Actos)  Probiotic  Sildenafil  Tamsulosin (Flomax)  Umeclidinium (Anoro)  Vilanterol (Anoro) ==================================================================== For clinical consultation, please call [phone]. ====================================================================      ROS  Constitutional: Denies any fever or chills Gastrointestinal: No reported hemesis, hematochezia, vomiting, or acute GI distress Musculoskeletal: Denies any acute onset joint swelling, redness, loss of ROM, or weakness Neurological: No reported episodes of acute onset apraxia, aphasia, dysarthria, agnosia, amnesia, paralysis, loss of coordination, or loss of consciousness  Medication Review  Cyanocobalamin, DULoxetine, Dulaglutide, Multi-Vitamins, Probiotic Product, Tiotropium Bromide Monohydrate, albuterol, aspirin EC, atorvastatin, calcium carbonate, canagliflozin, clopidogrel, donepezil, finasteride, gabapentin, glimepiride, glucose blood, losartan, magnesium oxide, metFORMIN, oxymetazoline, pantoprazole, pioglitazone, sildenafil, and tamsulosin  History Review  Allergy: Taylor Austin is allergic to hydrocodone and oxycodone-acetaminophen. Drug: Taylor Austin  reports no  history of drug use. Alcohol:  reports current alcohol use of about 1.0 standard drink of alcohol per week. Tobacco:  reports that he quit smoking about 47 years ago. His smoking use included cigarettes. He has never used smokeless tobacco. Social: Taylor Austin  reports that he quit smoking about 47 years ago. His smoking use included cigarettes. He has never used smokeless tobacco. He reports current alcohol use of about 1.0 standard drink of alcohol per week. He reports that he does not use drugs. Medical:  has a past medical history of Allergy, Anemia, Arthritis, Back pain, Barrett esophagus, BPH (benign prostatic hyperplasia), Chronic kidney disease, COPD (chronic obstructive pulmonary disease) ([place]), Coronary artery disease, Depression, Diabetes mellitus without complication ([place]), Diverticulitis, Dysphagia, Esophageal reflux, Esophageal reflux, GERD (gastroesophageal reflux disease), Heart murmur, Hyperlipidemia, Hypersomnia, Hypertension, Kidney stones, Low testosterone, OAB (overactive bladder), Obesity, Presence of dental bridge, Rheumatic fever, Sleep apnea, Stroke ([place]) (01/2016), Vertigo, and Vitamin B12 deficiency. Surgical: Taylor Austin  has a past surgical history that includes Lumbar spine surgery; Cervical spine surgery; Thoracic spine  surgery; Colonoscopy; Esophagogastroduodenoscopy endoscopy; Carotid stent; Back surgery; Tonsillectomy; Coronary angioplasty; Cardiac catheterization (08/11/2014); Cardiac catheterization (N/A, 11/17/2014); Colonoscopy with propofol (N/A, 06/10/2015); Cataract extraction w/PHACO (Left, 04/03/2016); Cataract extraction w/PHACO (Right, 05/01/2016); Eye surgery; and Esophagogastroduodenoscopy (egd) with propofol (N/A, 06/28/2017). Family: family history includes Anesthesia problems in his father and mother; Prostate cancer in his brother; Stroke in his father.  Laboratory Chemistry Profile   Renal Lab Results  Component Value Date   BUN 17 11/19/2020   CREATININE  0.91 11/19/2020   GFRAA >60 02/02/2016   GFRNONAA >60 11/19/2020    Hepatic Lab Results  Component Value Date   AST 23 11/19/2020   ALT 20 11/19/2020   ALBUMIN 3.8 11/19/2020   ALKPHOS 55 11/19/2020    Electrolytes Lab Results  Component Value Date   NA 135 11/19/2020   K 4.4 11/19/2020   CL 100 11/19/2020   CALCIUM 9.0 11/19/2020   MG 1.6 (L) 06/15/2020    Bone Lab Results  Component Value Date   25OHVITD1 44 06/15/2020   25OHVITD2 <1.0 06/15/2020   25OHVITD3 43 06/15/2020    Inflammation (CRP: Acute Phase) (ESR: Chronic Phase) Lab Results  Component Value Date   CRP 0.6 06/15/2020   ESRSEDRATE 13 06/15/2020         Note: Above Lab results reviewed.  Recent Imaging Review  DG PAIN CLINIC C-ARM 1-60 MIN NO REPORT Fluoro was used, but no Radiologist interpretation will be provided.  Please refer to "NOTES" tab for provider progress note. Note: Reviewed        Physical Exam  General appearance: Well nourished, well developed, and well hydrated. In no apparent acute distress Mental status: Alert, oriented x 3 (person, place, & time)       Respiratory: No evidence of acute respiratory distress Eyes: PERLA Vitals: There were no vitals taken for this visit. BMI: Estimated body mass index is 41.6 kg/m as calculated from the following:   Height as of 03/01/21: 5\' 5"  (1.651 m).   Weight as of 03/01/21: 250 lb (113.4 kg). Ideal: Patient weight not recorded  Assessment   Diagnosis Status  No diagnosis found. Controlled Controlled Controlled   Updated Problems: No problems updated.  Plan of Care  Problem-specific:  No problem-specific Assessment & Plan notes found for this encounter.  Taylor Austin has a current medication list which includes the following long-term medication(s): albuterol, atorvastatin, calcium carbonate, donepezil, duloxetine, gabapentin, glimepiride, losartan, pantoprazole, pioglitazone, and spiriva respimat.  Pharmacotherapy  (Medications Ordered): No orders of the defined types were placed in this encounter.  Orders:  No orders of the defined types were placed in this encounter.  Follow-up plan:   No follow-ups on file.     Interventional Therapies  Risk  Complexity Considerations:   Estimated body mass index is 43.27 kg/m as calculated from the following:   Height as of this encounter: 5\' 5"  (1.651 m).   Weight as of this encounter: 260 lb (117.9 kg).  PLAVIX Anticoagulation (Stop: 7-10 days  Restart: 2 hours)   Planned  Pending:      Under consideration:   Diagnostic bilateral lumbar facet block #2    Completed by [name], MD: [facility] PMR) Left L4 TFESI x1 (03/03/2020) (0/0/0/0) (however, currently having no left lower extremity pain)  Left L5 TFESI x2 (01/14/2020; 02/11/2020) (100/100/0/0) (however, currently having no left lower extremity pain)    Completed:   Diagnostic/therapeutic bilateral lumbar facet MBB x1 (12/20/2020) (100/100/100/100)    Therapeutic  Palliative (PRN) options:   None established     Recent Visits No visits were found meeting these conditions. Showing recent visits within past 90 days and meeting all other requirements Future Appointments Date Type Provider Dept  06/19/21 Appointment Milinda Pointer, MD [facility]  Showing future appointments within next 90 days and meeting all other requirements  I discussed the assessment and treatment plan with the patient. The patient was provided an opportunity to ask questions and all were answered. The patient agreed with the plan and demonstrated an understanding of the instructions.  Patient advised to call back or seek an in-person evaluation if the symptoms or condition worsens.  Duration of encounter: *** minutes.  Note by: Gaspar Cola, MD Date: 06/19/2021; Time: 7:45 AM

## 2021-06-19 ENCOUNTER — Ambulatory Visit: Payer: Medicare HMO | Attending: Pain Medicine | Admitting: Pain Medicine

## 2021-06-19 ENCOUNTER — Encounter: Payer: Self-pay | Admitting: Pain Medicine

## 2021-06-19 VITALS — BP 165/82 | HR 76 | Temp 98.4°F | Ht 65.0 in | Wt 260.0 lb

## 2021-06-19 DIAGNOSIS — R937 Abnormal findings on diagnostic imaging of other parts of musculoskeletal system: Secondary | ICD-10-CM | POA: Insufficient documentation

## 2021-06-19 DIAGNOSIS — G8929 Other chronic pain: Secondary | ICD-10-CM | POA: Diagnosis not present

## 2021-06-19 DIAGNOSIS — Z7901 Long term (current) use of anticoagulants: Secondary | ICD-10-CM | POA: Diagnosis not present

## 2021-06-19 DIAGNOSIS — M47817 Spondylosis without myelopathy or radiculopathy, lumbosacral region: Secondary | ICD-10-CM | POA: Insufficient documentation

## 2021-06-19 DIAGNOSIS — M47816 Spondylosis without myelopathy or radiculopathy, lumbar region: Secondary | ICD-10-CM | POA: Diagnosis not present

## 2021-06-19 DIAGNOSIS — M4310 Spondylolisthesis, site unspecified: Secondary | ICD-10-CM | POA: Diagnosis not present

## 2021-06-19 DIAGNOSIS — M5137 Other intervertebral disc degeneration, lumbosacral region: Secondary | ICD-10-CM | POA: Diagnosis not present

## 2021-06-19 DIAGNOSIS — M545 Low back pain, unspecified: Secondary | ICD-10-CM | POA: Insufficient documentation

## 2021-06-19 NOTE — Progress Notes (Signed)
Safety precautions to be maintained throughout the outpatient stay will include: orient to surroundings, keep bed in low position, maintain call bell within reach at all times, provide assistance with transfer out of bed and ambulation.  

## 2021-06-19 NOTE — Patient Instructions (Addendum)
______________________________________________________________________  Preparing for Procedure with Sedation  NOTICE: Due to recent regulatory changes, starting on August 08, 2020, procedures requiring intravenous (IV) sedation will no longer be performed at the [place].  These types of procedures are required to be performed at [facility] ambulatory surgery facility.  We are very sorry for the inconvenience.  Procedure appointments are limited to planned procedures: No Prescription Refills. No disability issues will be discussed. No medication changes will be discussed.  Instructions: Oral Intake: Do not eat or drink anything for at least 8 hours prior to your procedure. (Exception: Blood Pressure Medication. See below.) Transportation: A driver is required. You may not drive yourself after the procedure. Blood Pressure Medicine: Do not forget to take your blood pressure medicine with a sip of water the morning of the procedure. If your Diastolic (lower reading) is above 100 mmHg, elective cases will be cancelled/rescheduled. Blood thinners: These will need to be stopped for procedures. Notify our staff if you are taking any blood thinners. Depending on which one you take, there will be specific instructions on how and when to stop it. Diabetics on insulin: Notify the staff so that you can be scheduled 1st case in the morning. If your diabetes requires high dose insulin, take only  of your normal insulin dose the morning of the procedure and notify the staff that you have done so. Preventing infections: Shower with an antibacterial soap the morning of your procedure. Build-up your immune system: Take 1000 mg of Vitamin C with every meal (3 times a day) the day prior to your procedure. Antibiotics: Inform the staff if you have a condition or reason that requires you to take antibiotics before dental procedures. Pregnancy: If you are pregnant, call and cancel the procedure. Sickness: If  you have a cold, fever, or any active infections, call and cancel the procedure. Arrival: You must be in the facility at least 30 minutes prior to your scheduled procedure. Children: Do not bring children with you. Dress appropriately: There is always the possibility that your clothing may get soiled. Valuables: Do not bring any jewelry or valuables.  Reasons to call and reschedule or cancel your procedure: (Following these recommendations will minimize the risk of a serious complication.) Surgeries: Avoid having procedures within 2 weeks of any surgery. (Avoid for 2 weeks before or after any surgery). Flu Shots: Avoid having procedures within 2 weeks of a flu shots. (Avoid for 2 weeks before or after immunizations). Barium: Avoid having a procedure within 7-10 days after having had a radiological study involving the use of radiological contrast. (Myelograms, Barium swallow or enema study). Heart attacks: Avoid any elective procedures or surgeries for the initial 6 months after a "Myocardial Infarction" (Heart Attack). Blood thinners: It is imperative that you stop these medications before procedures. Let us know if you if you take any blood thinner.  Infection: Avoid procedures during or within two weeks of an infection (including chest colds or gastrointestinal problems). Symptoms associated with infections include: Localized redness, fever, chills, night sweats or profuse sweating, burning sensation when voiding, cough, congestion, stuffiness, runny nose, sore throat, diarrhea, nausea, vomiting, cold or Flu symptoms, recent or current infections. It is specially important if the infection is over the area that we intend to treat. Heart and lung problems: Symptoms that may suggest an active cardiopulmonary problem include: cough, chest pain, breathing difficulties or shortness of breath, dizziness, ankle swelling, uncontrolled high or unusually low blood pressure, and/or palpitations. If you are  experiencing any of these symptoms, cancel your procedure and contact your primary care physician for an evaluation.  Remember:  Regular Business hours are:  Monday to Thursday 8:00 AM to 4:00 PM  Provider's Schedule: Milinda Pointer, MD:  Procedure days: Tuesday and Thursday 7:30 AM to 4:00 PM  Gillis Santa, MD:  Procedure days: Monday and Wednesday 7:30 AM to 4:00 PM ______________________________________________________________________  ____________________________________________________________________________________________  General Risks and Possible Complications  Patient Responsibilities: It is important that you read this as it is part of your informed consent. It is our duty to inform you of the risks and possible complications associated with treatments offered to you. It is your responsibility as a patient to read this and to ask questions about anything that is not clear or that you believe was not covered in this document.  Patient's Rights: You have the right to refuse treatment. You also have the right to change your mind, even after initially having agreed to have the treatment done. However, under this last option, if you wait until the last second to change your mind, you may be charged for the materials used up to that point.  Introduction: Medicine is not an Chief Strategy Officer. Everything in Medicine, including the lack of treatment(s), carries the potential for danger, harm, or loss (which is by definition: Risk). In Medicine, a complication is a secondary problem, condition, or disease that can aggravate an already existing one. All treatments carry the risk of possible complications. The fact that a side effects or complications occurs, does not imply that the treatment was conducted incorrectly. It must be clearly understood that these can happen even when everything is done following the highest safety standards.  No treatment: You can choose not to proceed with the  proposed treatment alternative. The "PRO(s)" would include: avoiding the risk of complications associated with the therapy. The "CON(s)" would include: not getting any of the treatment benefits. These benefits fall under one of three categories: diagnostic; therapeutic; and/or palliative. Diagnostic benefits include: getting information which can ultimately lead to improvement of the disease or symptom(s). Therapeutic benefits are those associated with the successful treatment of the disease. Finally, palliative benefits are those related to the decrease of the primary symptoms, without necessarily curing the condition (example: decreasing the pain from a flare-up of a chronic condition, such as incurable terminal cancer).  General Risks and Complications: These are associated to most interventional treatments. They can occur alone, or in combination. They fall under one of the following six (6) categories: no benefit or worsening of symptoms; bleeding; infection; nerve damage; allergic reactions; and/or death. No benefits or worsening of symptoms: In Medicine there are no guarantees, only probabilities. No healthcare provider can ever guarantee that a medical treatment will work, they can only state the probability that it may. Furthermore, there is always the possibility that the condition may worsen, either directly, or indirectly, as a consequence of the treatment. Bleeding: This is more common if the patient is taking a blood thinner, either prescription or over the counter (example: Goody Powders, Fish oil, Aspirin, Garlic, etc.), or if suffering a condition associated with impaired coagulation (example: Hemophilia, cirrhosis of the liver, low platelet counts, etc.). However, even if you do not have one on these, it can still happen. If you have any of these conditions, or take one of these drugs, make sure to notify your treating physician. Infection: This is more common in patients with a compromised  immune system, either due to disease (example:  diabetes, cancer, human immunodeficiency virus [HIV], etc.), or due to medications or treatments (example: therapies used to treat cancer and rheumatological diseases). However, even if you do not have one on these, it can still happen. If you have any of these conditions, or take one of these drugs, make sure to notify your treating physician. Nerve Damage: This is more common when the treatment is an invasive one, but it can also happen with the use of medications, such as those used in the treatment of cancer. The damage can occur to small secondary nerves, or to large primary ones, such as those in the spinal cord and brain. This damage may be temporary or permanent and it may lead to impairments that can range from temporary numbness to permanent paralysis and/or brain death. Allergic Reactions: Any time a substance or material comes in contact with our body, there is the possibility of an allergic reaction. These can range from a mild skin rash (contact dermatitis) to a severe systemic reaction (anaphylactic reaction), which can result in death. Death: In general, any medical intervention can result in death, most of the time due to an unforeseen complication. ____________________________________________________________________________________________ ____________________________________________________________________________________________  Blood Thinners  IMPORTANT NOTICE:  If you take any of these, make sure to notify the nursing staff.  Failure to do so may result in injury.  Recommended time intervals to stop and restart blood-thinners, before & after invasive procedures  Generic Name Brand Name Pre-procedure. Stop this long before procedure. Post-procedure. Minimum waiting period before restarting.  Abciximab Reopro 15 days 2 hrs  Alteplase Activase 10 days 10 days  Anagrelide Agrylin    Apixaban Eliquis 3 days 6 hrs  Cilostazol Pletal  3 days 5 hrs  Clopidogrel Plavix 7-10 days 2 hrs  Dabigatran Pradaxa 5 days 6 hrs  Dalteparin Fragmin 24 hours 4 hrs  Dipyridamole Aggrenox 11days 2 hrs  Edoxaban Lixiana; Savaysa 3 days 2 hrs  Enoxaparin  Lovenox 24 hours 4 hrs  Eptifibatide Integrillin 8 hours 2 hrs  Fondaparinux  Arixtra 72 hours 12 hrs  Hydroxychloroquine Plaquenil 11 days   Prasugrel Effient 7-10 days 6 hrs  Reteplase Retavase 10 days 10 days  Rivaroxaban Xarelto 3 days 6 hrs  Ticagrelor Brilinta 5-7 days 6 hrs  Ticlopidine Ticlid 10-14 days 2 hrs  Tinzaparin Innohep 24 hours 4 hrs  Tirofiban Aggrastat 8 hours 2 hrs  Warfarin Coumadin 5 days 2 hrs   Other medications with blood-thinning effects  Product indications Generic (Brand) names Note  Cholesterol Lipitor Stop 4 days before procedure  Blood thinner (injectable) Heparin (LMW or LMWH Heparin) Stop 24 hours before procedure  Cancer Ibrutinib (Imbruvica) Stop 7 days before procedure  Malaria/Rheumatoid Hydroxychloroquine (Plaquenil) Stop 11 days before procedure  Thrombolytics  10 days before or after procedures   Over-the-counter (OTC) Products with blood-thinning effects  Product Common names Stop Time  Aspirin > 325 mg Goody Powders, Excedrin, etc. 11 days  Aspirin ? 81 mg  7 days  Fish oil  4 days  Garlic supplements  7 days  Ginkgo biloba  36 hours  Ginseng  24 hours  NSAIDs Ibuprofen, Naprosyn, etc. 3 days  Vitamin E  4 days   ____________________________________________________________________________________________    ______________________________________________________________________________________________  Body mass index (BMI)  Body mass index (BMI) is a common tool for deciding whether a person has an appropriate body weight.  It measures a persons weight in relation to their height.   According to the [employer] of health (NIH): A BMI  of less than 18.5 means that a person is underweight. A BMI of between 18.5 and 24.9  is ideal. A BMI of between 25 and 29.9 is overweight. A BMI over 30 indicates obesity.  Weight Management Required  URGENT: Your weight has been found to be adversely affecting your health.  Dear Taylor Austin:  Your current Estimated body mass index is 43.27 kg/m as calculated from the following:   Height as of this encounter: _0  (1.651 m).   Weight as of this encounter: 260 lb (117.9 kg).  Please use the table below to identify your weight category and associated incidence of chronic pain, secondary to your weight.  Body Mass Index (BMI) Classification BMI level (kg/m2) Category Associated incidence of chronic pain  <18  Underweight   18.5-24.9 Ideal body weight   25-29.9 Overweight  20%  30-34.9 Obese (Class I)  68%  35-39.9 Severe obesity (Class II)  136%  >40 Extreme obesity (Class III)  254%   In addition: You will be considered "Morbidly Obese", if your BMI is above 30 and you have one or more of the following conditions which are known to be caused and/or directly associated with obesity: 1.    Type 2 Diabetes (Which in turn can lead to cardiovascular diseases (CVD), stroke, peripheral vascular diseases (PVD), retinopathy, nephropathy, and neuropathy) 2.    Cardiovascular Disease (High Blood Pressure; Congestive Heart Failure; High Cholesterol; Coronary Artery Disease; Angina; or History of Heart Attacks) 3.    Breathing problems (Asthma; obesity-hypoventilation syndrome; obstructive sleep apnea; chronic inflammatory airway disease; reactive airway disease; or shortness of breath) 4.    Chronic kidney disease 5.    Liver disease (nonalcoholic fatty liver disease) 6.    High blood pressure 7.    Acid reflux (gastroesophageal reflux disease; heartburn) 8.    Osteoarthritis (OA) (with any of the following: hip pain; knee pain; and/or low back pain) 9.    Low back pain (Lumbar Facet Syndrome; and/or Degenerative Disc Disease) 10.  Hip pain (Osteoarthritis of hip) (For every  1 lbs of added body weight, there is a 2 lbs increase in pressure inside of each hip articulation. 1:2 mechanical relationship) 11.  Knee pain (Osteoarthritis of knee) (For every 1 lbs of added body weight, there is a 4 lbs increase in pressure inside of each knee articulation. 1:4 mechanical relationship) (patients with a BMI>30 kg/m2 were 6.8 times more likely to develop knee OA than normal-weight individuals) 12.  Cancer: Epidemiological studies have shown that obesity is a risk factor for: post-menopausal breast cancer; cancers of the endometrium, colon and kidney cancer; malignant adenomas of the oesophagus. Obese subjects have an approximately 1.5-3.5-fold increased risk of developing these cancers compared with normal-weight subjects, and it has been estimated that between 15 and 45% of these cancers can be attributed to overweight. More recent studies suggest that obesity may also increase the risk of other types of cancer, including pancreatic, hepatic and gallbladder cancer. (Ref: Obesity and cancer. Pischon T, Nthlings U, [employer] H. Proc Nutr Soc. 2008 May;67(2):128-45. doi: 26.3785/Y8502774128786767.) The International Agency for Research on Cancer (IARC) has identified 13 cancers associated with overweight and obesity: meningioma, multiple myeloma, adenocarcinoma of the esophagus, and cancers of the thyroid, postmenopausal breast cancer, gallbladder, stomach, liver, pancreas, kidney, ovaries, uterus, colon and rectal (colorectal) cancers. 65 percent of all cancers diagnosed in women and 24 percent of those diagnosed in men are associated with overweight and obesity.  Recommendation: At this point it is urgent  that you take a step back and concentrate in loosing weight. Dedicate 100% of your efforts on this task. Nothing else will improve your health more than bringing your weight down and your BMI to less than 30. If you are here, you probably have chronic pain. We know that most chronic pain  patients have difficulty exercising secondary to their pain. For this reason, you must rely on proper nutrition and diet in order to lose the weight. If your BMI is above 40, you should seriously consider bariatric surgery. A realistic goal is to lose 10% of your body weight over a period of 12 months.  Be honest to yourself, if over time you have unsuccessfully tried to lose weight, then it is time for you to seek professional help and to enter a medically supervised weight management program, and/or undergo bariatric surgery. Stop procrastinating.   Pain management considerations:  1.    Pharmacological Problems: Be advised that the use of opioid analgesics (oxycodone; hydrocodone; morphine; methadone; codeine; and all of their derivatives) have been associated with decreased metabolism and weight gain.  For this reason, should we see that you are unable to lose weight while taking these medications, it may become necessary for [place] to taper down and indefinitely discontinue them.  2.    Technical Problems: The incidence of successful interventional therapies decreases as the patient's BMI increases. It is much more difficult to accomplish a safe and effective interventional therapy on a patient with a BMI above 35. 3.    Radiation Exposure Problems: The x-rays machine, used to accomplish injection therapies, will automatically increase their x-ray output in order to capture an appropriate bone image. This means that radiation exposure increases exponentially with the patient's BMI. (The higher the BMI, the higher the radiation exposure.) Although the level of radiation used at a given time is still safe to the patient, it is not for the physician and/or assisting staff. Unfortunately, radiation exposure is accumulative. Because physicians and the staff have to do procedures and be exposed on a daily basis, this can result in health problems such as cancer and radiation burns. Radiation exposure to the staff is  monitored by the radiation batches that they wear. The exposure levels are reported back to the staff on a quarterly basis. Depending on levels of exposure, physicians and staff may be obligated by law to decrease this exposure. This means that they have the right and obligation to refuse providing therapies where they may be overexposed to radiation. For this reason, physicians may decline to offer therapies such as radiofrequency ablation or implants to patients with a BMI above 40. 4.    Current Trends: Be advised that the current trend is to no longer offer certain therapies to patients with a BMI equal to, or above 35, due to increase perioperative risks, increased technical procedural difficulties, and excessive radiation exposure to healthcare personnel.  ______________________________________________________________________________________________

## 2021-06-23 DIAGNOSIS — G8929 Other chronic pain: Secondary | ICD-10-CM | POA: Diagnosis not present

## 2021-06-23 DIAGNOSIS — M1712 Unilateral primary osteoarthritis, left knee: Secondary | ICD-10-CM | POA: Diagnosis not present

## 2021-06-23 DIAGNOSIS — M25462 Effusion, left knee: Secondary | ICD-10-CM | POA: Diagnosis not present

## 2021-06-23 DIAGNOSIS — M25562 Pain in left knee: Secondary | ICD-10-CM | POA: Diagnosis not present

## 2021-07-06 ENCOUNTER — Ambulatory Visit: Payer: Medicare HMO | Admitting: Pain Medicine

## 2021-07-16 NOTE — Progress Notes (Unsigned)
PROVIDER NOTE: Interpretation of information contained herein should be left to medically-trained personnel. Specific patient instructions are provided elsewhere under "Patient Instructions" section of medical record. This document was created in part using STT-dictation technology, any transcriptional errors that may result from this process are unintentional.  Patient: Taylor Austin Type: Established DOB: 1940/12/22 MRN: [id number] PCP: Juluis Pitch, MD  Service: Procedure DOS: 07/18/2021 Setting: Ambulatory Location: Ambulatory outpatient facility Delivery: Face-to-face Provider: Gaspar Cola, MD Specialty: Interventional Pain Management Specialty designation: 09 Location: Outpatient facility Ref. Prov.: Milinda Pointer, MD    Primary Reason for Visit: Interventional Pain Management Treatment. CC: No chief complaint on file.   Procedure:           Type: Lumbar Facet, Medial Branch Block(s) #2  Laterality: Bilateral  Level: L2, L3, L4, L5, & S1 Medial Branch Level(s). Injecting these levels blocks the L3-4, L4-5 and L5-S1 lumbar facet joints. Imaging: Fluoroscopic guidance Anesthesia: Local anesthesia (1-2% Lidocaine) Anxiolysis: IV Versed         Sedation: None. DOS: 07/18/2021 Performed by: Gaspar Cola, MD  Primary Purpose: Diagnostic/Therapeutic Indications: Low back pain severe enough to impact quality of life or function. No diagnosis found. NAS-11 Pain score:   Pre-procedure:  /10   Post-procedure:  /10     Position / Prep / Materials:  Position: Prone  Prep solution: DuraPrep (Iodine Povacrylex [0.7% available iodine] and Isopropyl Alcohol, 74% w/w) Area Prepped: Posterolateral Lumbosacral Spine (Wide prep: From the lower border of the scapula down to the end of the tailbone and from flank to flank.)  Materials:  Tray: Block Needle(s):  Type: Spinal  Gauge (G): 22  Length: 5-in Qty: 4  Pre-op H&P Assessment:  Taylor Austin is a 81 y.o.  (year old), male patient, seen today for interventional treatment. He  has a past surgical history that includes Lumbar spine surgery; Cervical spine surgery; Thoracic spine surgery; Colonoscopy; Esophagogastroduodenoscopy endoscopy; Carotid stent; Back surgery; Tonsillectomy; Coronary angioplasty; Cardiac catheterization (08/11/2014); Cardiac catheterization (N/A, 11/17/2014); Colonoscopy with propofol (N/A, 06/10/2015); Cataract extraction w/PHACO (Left, 04/03/2016); Cataract extraction w/PHACO (Right, 05/01/2016); Eye surgery; and Esophagogastroduodenoscopy (egd) with propofol (N/A, 06/28/2017). Taylor Austin has a current medication list which includes the following prescription(s): accu-chek aviva plus, albuterol, aspirin ec, atorvastatin, calcium carbonate, clopidogrel, cyanocobalamin, donepezil, duloxetine, finasteride, gabapentin, glimepiride, invokana, losartan, losartan, magnesium oxide, metformin, multi-vitamins, oxymetazoline, pantoprazole, pioglitazone, probiotic product, sildenafil, spiriva respimat, tamsulosin, trulicity, and xultophy. His primarily concern today is the No chief complaint on file.  Initial Vital Signs:  Pulse/HCG Rate:    Temp:   Resp:   BP:   SpO2:    BMI: Estimated body mass index is 43.27 kg/m as calculated from the following:   Height as of 06/19/21: '5\' 5"'$  (1.651 m).   Weight as of 06/19/21: 260 lb (117.9 kg).  Risk Assessment: Allergies: Reviewed. He is allergic to hydrocodone and oxycodone-acetaminophen.  Allergy Precautions: None required Coagulopathies: Reviewed. None identified.  Blood-thinner therapy: None at this time Active Infection(s): Reviewed. None identified. Taylor Austin is afebrile  Site Confirmation: Taylor Austin was asked to confirm the procedure and laterality before marking the site Procedure checklist: Completed Consent: Before the procedure and under the influence of no sedative(s), amnesic(s), or anxiolytics, the patient was informed of the  treatment options, risks and possible complications. To fulfill our ethical and legal obligations, as recommended by the American Medical Association's Code of Ethics, I have informed the patient of my clinical impression; the nature and purpose of the treatment  or procedure; the risks, benefits, and possible complications of the intervention; the alternatives, including doing nothing; the risk(s) and benefit(s) of the alternative treatment(s) or procedure(s); and the risk(s) and benefit(s) of doing nothing. The patient was provided information about the general risks and possible complications associated with the procedure. These may include, but are not limited to: failure to achieve desired goals, infection, bleeding, organ or nerve damage, allergic reactions, paralysis, and death. In addition, the patient was informed of those risks and complications associated to Spine-related procedures, such as failure to decrease pain; infection (i.e.: Meningitis, epidural or intraspinal abscess); bleeding (i.e.: epidural hematoma, subarachnoid hemorrhage, or any other type of intraspinal or peri-dural bleeding); organ or nerve damage (i.e.: Any type of peripheral nerve, nerve root, or spinal cord injury) with subsequent damage to sensory, motor, and/or autonomic systems, resulting in permanent pain, numbness, and/or weakness of one or several areas of the body; allergic reactions; (i.e.: anaphylactic reaction); and/or death. Furthermore, the patient was informed of those risks and complications associated with the medications. These include, but are not limited to: allergic reactions (i.e.: anaphylactic or anaphylactoid reaction(s)); adrenal axis suppression; blood sugar elevation that in diabetics may result in ketoacidosis or comma; water retention that in patients with history of congestive heart failure may result in shortness of breath, pulmonary edema, and decompensation with resultant heart failure; weight gain;  swelling or edema; medication-induced neural toxicity; particulate matter embolism and blood vessel occlusion with resultant organ, and/or nervous system infarction; and/or aseptic necrosis of one or more joints. Finally, the patient was informed that Medicine is not an exact science; therefore, there is also the possibility of unforeseen or unpredictable risks and/or possible complications that may result in a catastrophic outcome. The patient indicated having understood very clearly. We have given the patient no guarantees and we have made no promises. Enough time was given to the patient to ask questions, all of which were answered to the patient's satisfaction. Mr. Hoefle has indicated that he wanted to continue with the procedure. Attestation: I, the ordering provider, attest that I have discussed with the patient the benefits, risks, side-effects, alternatives, likelihood of achieving goals, and potential problems during recovery for the procedure that I have provided informed consent. Date  Time: {CHL [facility]-PAIN TIME CHOICES:[id number]}  Pre-Procedure Preparation:  Monitoring: As per clinic protocol. Respiration, ETCO2, SpO2, BP, heart rate and rhythm monitor placed and checked for adequate function Safety Precautions: Patient was assessed for positional comfort and pressure points before starting the procedure. Time-out: I initiated and conducted the "Time-out" before starting the procedure, as per protocol. The patient was asked to participate by confirming the accuracy of the "Time Out" information. Verification of the correct person, site, and procedure were performed and confirmed by me, the nursing staff, and the patient. "Time-out" conducted as per Joint Commission's Universal Protocol (UP.01.01.01). Time:    Description of Procedure:          Laterality: Bilateral. The procedure was performed in identical fashion on both sides. Targeted Levels:  L2, L3, L4, L5, & S1 Medial Branch  Level(s)  Safety Precautions: Aspiration looking for blood return was conducted prior to all injections. At no point did we inject any substances, as a needle was being advanced. Before injecting, the patient was told to immediately notify me if he was experiencing any new onset of "ringing in the ears, or metallic taste in the mouth". No attempts were made at seeking any paresthesias. Safe injection practices and needle disposal techniques used.  Medications properly checked for expiration dates. SDV (single dose vial) medications used. After the completion of the procedure, all disposable equipment used was discarded in the proper designated medical waste containers. Local Anesthesia: Protocol guidelines were followed. The patient was positioned over the fluoroscopy table. The area was prepped in the usual manner. The time-out was completed. The target area was identified using fluoroscopy. A 12-in long, straight, sterile hemostat was used with fluoroscopic guidance to locate the targets for each level blocked. Once located, the skin was marked with an approved surgical skin marker. Once all sites were marked, the skin (epidermis, dermis, and hypodermis), as well as deeper tissues (fat, connective tissue and muscle) were infiltrated with a small amount of a short-acting local anesthetic, loaded on a 10cc syringe with a 25G, 1.5-in  Needle. An appropriate amount of time was allowed for local anesthetics to take effect before proceeding to the next step. Local Anesthetic: Lidocaine 2.0% The unused portion of the local anesthetic was discarded in the proper designated containers. Technical description of process:  L2 Medial Branch Nerve Block (MBB): The target area for the L2 medial branch is at the junction of the postero-lateral aspect of the superior articular process and the superior, posterior, and medial edge of the transverse process of L3. Under fluoroscopic guidance, a Quincke needle was inserted until  contact was made with os over the superior postero-lateral aspect of the pedicular shadow (target area). After negative aspiration for blood, 0.5 mL of the nerve block solution was injected without difficulty or complication. The needle was removed intact. L3 Medial Branch Nerve Block (MBB): The target area for the L3 medial branch is at the junction of the postero-lateral aspect of the superior articular process and the superior, posterior, and medial edge of the transverse process of L4. Under fluoroscopic guidance, a Quincke needle was inserted until contact was made with os over the superior postero-lateral aspect of the pedicular shadow (target area). After negative aspiration for blood, 0.5 mL of the nerve block solution was injected without difficulty or complication. The needle was removed intact. L4 Medial Branch Nerve Block (MBB): The target area for the L4 medial branch is at the junction of the postero-lateral aspect of the superior articular process and the superior, posterior, and medial edge of the transverse process of L5. Under fluoroscopic guidance, a Quincke needle was inserted until contact was made with os over the superior postero-lateral aspect of the pedicular shadow (target area). After negative aspiration for blood, 0.5 mL of the nerve block solution was injected without difficulty or complication. The needle was removed intact. L5 Medial Branch Nerve Block (MBB): The target area for the L5 medial branch is at the junction of the postero-lateral aspect of the superior articular process and the superior, posterior, and medial edge of the sacral ala. Under fluoroscopic guidance, a Quincke needle was inserted until contact was made with os over the superior postero-lateral aspect of the pedicular shadow (target area). After negative aspiration for blood, 0.5 mL of the nerve block solution was injected without difficulty or complication. The needle was removed intact. S1 Medial Branch Nerve  Block (MBB): The target area for the S1 medial branch is at the posterior and inferior 6 o'clock position of the L5-S1 facet joint. Under fluoroscopic guidance, the Quincke needle inserted for the L5 MBB was redirected until contact was made with os over the inferior and postero aspect of the sacrum, at the 6 o' clock position under the L5-S1 facet joint (Target  area). After negative aspiration for blood, 0.5 mL of the nerve block solution was injected without difficulty or complication. The needle was removed intact.  Once the entire procedure was completed, the treated area was cleaned, making sure to leave some of the prepping solution back to take advantage of its long term bactericidal properties.      Illustration of the posterior view of the lumbar spine and the posterior neural structures. Laminae of L2 through S1 are labeled. DPRL5, dorsal primary ramus of L5; DPRS1, dorsal primary ramus of S1; DPR3, dorsal primary ramus of L3; FJ, facet (zygapophyseal) joint L3-L4; I, inferior articular process of L4; LB1, lateral branch of dorsal primary ramus of L1; IAB, inferior articular branches from L3 medial branch (supplies L4-L5 facet joint); IBP, intermediate branch plexus; MB3, medial branch of dorsal primary ramus of L3; NR3, third lumbar nerve root; S, superior articular process of L5; SAB, superior articular branches from L4 (supplies L4-5 facet joint also); TP3, transverse process of L3.  There were no vitals filed for this visit.   Start Time:   hrs. End Time:   hrs.  Imaging Guidance (Spinal):          Type of Imaging Technique: Fluoroscopy Guidance (Spinal) Indication(s): Assistance in needle guidance and placement for procedures requiring needle placement in or near specific anatomical locations not easily accessible without such assistance. Exposure Time: Please see nurses notes. Contrast: None used. Fluoroscopic Guidance: I was personally present during the use of fluoroscopy.  "Tunnel Vision Technique" used to obtain the best possible view of the target area. Parallax error corrected before commencing the procedure. "Direction-depth-direction" technique used to introduce the needle under continuous pulsed fluoroscopy. Once target was reached, antero-posterior, oblique, and lateral fluoroscopic projection used confirm needle placement in all planes. Images permanently stored in EMR. Interpretation: No contrast injected. I personally interpreted the imaging intraoperatively. Adequate needle placement confirmed in multiple planes. Permanent images saved into the patient's record.  Antibiotic Prophylaxis:   Anti-infectives (From admission, onward)    None      Indication(s): None identified  Post-operative Assessment:  Post-procedure Vital Signs:  Pulse/HCG Rate:    Temp:   Resp:   BP:   SpO2:    EBL: None  Complications: No immediate post-treatment complications observed by team, or reported by patient.  Note: The patient tolerated the entire procedure well. A repeat set of vitals were taken after the procedure and the patient was kept under observation following institutional policy, for this type of procedure. Post-procedural neurological assessment was performed, showing return to baseline, prior to discharge. The patient was provided with post-procedure discharge instructions, including a section on how to identify potential problems. Should any problems arise concerning this procedure, the patient was given instructions to immediately contact us, at any time, without hesitation. In any case, we plan to contact the patient by telephone for a follow-up status report regarding this interventional procedure.  Comments:  No additional relevant information.  Plan of Care  Orders:  No orders of the defined types were placed in this encounter.  Chronic Opioid Analgesic:  None. No chronic opioid analgesics therapy prescribed by our practice. MME/day: 0 mg/day    Medications ordered for procedure: No orders of the defined types were placed in this encounter.  Medications administered: Felipe Drone had no medications administered during this visit.  See the medical record for exact dosing, route, and time of administration.  Follow-up plan:   No follow-ups on file.  Interventional Therapies  Risk  Complexity Considerations:   Estimated body mass index is 43.27 kg/m as calculated from the following:   Height as of this encounter: '5\' 5"'$  (1.651 m).   Weight as of this encounter: 260 lb (117.9 kg).  PLAVIX Anticoagulation (Stop: 7-10 days  Restart: 2 hours)   Planned  Pending:   Diagnostic/therapeutic bilateral lumbar facet MBB #2    Under consideration:   Diagnostic bilateral lumbar facet block #2    Completed by Girtha Hake, MD: ([facility] PMR) Left L4 TFESI x1 (03/03/2020) (0/0/0/0) (however, currently having no left lower extremity pain)  Left L5 TFESI x2 (01/14/2020; 02/11/2020) (100/100/0/0) (however, currently having no left lower extremity pain)    Completed:   Diagnostic/therapeutic bilateral lumbar facet MBB x1 (12/20/2020) (100/100/100/100)    Therapeutic  Palliative (PRN) options:   None established     Recent Visits Date Type Provider Dept  06/19/21 Office Visit Milinda Pointer, MD [facility]  Showing recent visits within past 90 days and meeting all other requirements Future Appointments Date Type Provider Dept  07/18/21 Appointment Milinda Pointer, MD [facility]  Showing future appointments within next 90 days and meeting all other requirements  Disposition: Discharge home  Discharge (Date  Time): 07/18/2021;   hrs.   Primary Care Physician: Juluis Pitch, MD Location: [facility] Note by: Gaspar Cola, MD Date: 07/18/2021; Time: 3:33 PM  Disclaimer:  Medicine is not an Chief Strategy Officer. The only guarantee in medicine is that  nothing is guaranteed. It is important to note that the decision to proceed with this intervention was based on the information collected from the patient. The Data and conclusions were drawn from the patient's questionnaire, the interview, and the physical examination. Because the information was provided in large part by the patient, it cannot be guaranteed that it has not been purposely or unconsciously manipulated. Every effort has been made to obtain as much relevant data as possible for this evaluation. It is important to note that the conclusions that lead to this procedure are derived in large part from the available data. Always take into account that the treatment will also be dependent on availability of resources and existing treatment guidelines, considered by other Pain Management Practitioners as being common knowledge and practice, at the time of the intervention. For Medico-Legal purposes, it is also important to point out that variation in procedural techniques and pharmacological choices are the acceptable norm. The indications, contraindications, technique, and results of the above procedure should only be interpreted and judged by a Board-Certified Interventional Pain Specialist with extensive familiarity and expertise in the same exact procedure and technique.

## 2021-07-18 ENCOUNTER — Ambulatory Visit: Payer: Medicare HMO | Attending: Pain Medicine | Admitting: Pain Medicine

## 2021-07-18 ENCOUNTER — Other Ambulatory Visit: Payer: Self-pay

## 2021-07-18 ENCOUNTER — Encounter: Payer: Self-pay | Admitting: Pain Medicine

## 2021-07-18 ENCOUNTER — Ambulatory Visit
Admission: RE | Admit: 2021-07-18 | Discharge: 2021-07-18 | Disposition: A | Discharge status: Home / Self Care | Payer: Medicare HMO | Source: Ambulatory Visit | Attending: Pain Medicine | Admitting: Pain Medicine

## 2021-07-18 VITALS — BP 155/76 | HR 75 | Temp 97.8°F | Resp 16 | Ht 65.0 in | Wt 260.0 lb

## 2021-07-18 DIAGNOSIS — Z79899 Other long term (current) drug therapy: Secondary | ICD-10-CM | POA: Insufficient documentation

## 2021-07-18 DIAGNOSIS — Z7901 Long term (current) use of anticoagulants: Secondary | ICD-10-CM | POA: Diagnosis not present

## 2021-07-18 DIAGNOSIS — Z7984 Long term (current) use of oral hypoglycemic drugs: Secondary | ICD-10-CM | POA: Diagnosis not present

## 2021-07-18 DIAGNOSIS — M545 Low back pain, unspecified: Secondary | ICD-10-CM | POA: Insufficient documentation

## 2021-07-18 DIAGNOSIS — Z961 Presence of intraocular lens: Secondary | ICD-10-CM | POA: Insufficient documentation

## 2021-07-18 DIAGNOSIS — Z9889 Other specified postprocedural states: Secondary | ICD-10-CM | POA: Insufficient documentation

## 2021-07-18 DIAGNOSIS — M4310 Spondylolisthesis, site unspecified: Secondary | ICD-10-CM | POA: Insufficient documentation

## 2021-07-18 DIAGNOSIS — M5459 Other low back pain: Secondary | ICD-10-CM | POA: Insufficient documentation

## 2021-07-18 DIAGNOSIS — I951 Orthostatic hypotension: Secondary | ICD-10-CM | POA: Insufficient documentation

## 2021-07-18 DIAGNOSIS — Z955 Presence of coronary angioplasty implant and graft: Secondary | ICD-10-CM | POA: Insufficient documentation

## 2021-07-18 DIAGNOSIS — Z9842 Cataract extraction status, left eye: Secondary | ICD-10-CM | POA: Insufficient documentation

## 2021-07-18 DIAGNOSIS — Z7985 Long-term (current) use of injectable non-insulin antidiabetic drugs: Secondary | ICD-10-CM | POA: Diagnosis not present

## 2021-07-18 DIAGNOSIS — R7301 Impaired fasting glucose: Secondary | ICD-10-CM | POA: Diagnosis not present

## 2021-07-18 DIAGNOSIS — M47817 Spondylosis without myelopathy or radiculopathy, lumbosacral region: Secondary | ICD-10-CM | POA: Diagnosis not present

## 2021-07-18 DIAGNOSIS — M47896 Other spondylosis, lumbar region: Secondary | ICD-10-CM | POA: Insufficient documentation

## 2021-07-18 DIAGNOSIS — M5137 Other intervertebral disc degeneration, lumbosacral region: Secondary | ICD-10-CM | POA: Insufficient documentation

## 2021-07-18 DIAGNOSIS — Z7951 Long term (current) use of inhaled steroids: Secondary | ICD-10-CM | POA: Diagnosis not present

## 2021-07-18 DIAGNOSIS — Z9841 Cataract extraction status, right eye: Secondary | ICD-10-CM | POA: Diagnosis not present

## 2021-07-18 DIAGNOSIS — G8929 Other chronic pain: Secondary | ICD-10-CM | POA: Diagnosis not present

## 2021-07-18 DIAGNOSIS — Z7982 Long term (current) use of aspirin: Secondary | ICD-10-CM | POA: Diagnosis not present

## 2021-07-18 DIAGNOSIS — Z6841 Body Mass Index (BMI) 40.0 and over, adult: Secondary | ICD-10-CM | POA: Diagnosis not present

## 2021-07-18 DIAGNOSIS — R937 Abnormal findings on diagnostic imaging of other parts of musculoskeletal system: Secondary | ICD-10-CM | POA: Insufficient documentation

## 2021-07-18 DIAGNOSIS — M47816 Spondylosis without myelopathy or radiculopathy, lumbar region: Secondary | ICD-10-CM | POA: Diagnosis not present

## 2021-07-18 LAB — GLUCOSE, CAPILLARY
Glucose-Capillary: 62 mg/dL — ABNORMAL LOW (ref 70–99)
Glucose-Capillary: 64 mg/dL — ABNORMAL LOW (ref 70–99)
Glucose-Capillary: 86 mg/dL (ref 70–99)
Glucose-Capillary: 87 mg/dL (ref 70–99)

## 2021-07-18 MED ORDER — TRIAMCINOLONE ACETONIDE 40 MG/ML IJ SUSP
80.0000 mg | Freq: Once | INTRAMUSCULAR | Status: AC
  Administered 2021-07-18: 40 mg
  Filled 2021-07-18: qty 2

## 2021-07-18 MED ORDER — MIDAZOLAM HCL 5 MG/5ML IJ SOLN
0.5000 mg | Freq: Once | INTRAMUSCULAR | Status: AC
  Administered 2021-07-18: 2 mg via INTRAVENOUS
  Filled 2021-07-18: qty 5

## 2021-07-18 MED ORDER — LACTATED RINGERS IV SOLN: Freq: Once | INTRAVENOUS | Status: AC

## 2021-07-18 MED ORDER — DEXTROSE 5 % IV SOLN
INTRAVENOUS | Status: DC
  Administered 2021-07-18: 50 mL via INTRAVENOUS

## 2021-07-18 MED ORDER — ROPIVACAINE HCL 2 MG/ML IJ SOLN
18.0000 mL | Freq: Once | INTRAMUSCULAR | Status: AC
  Administered 2021-07-18: 20 mL via PERINEURAL
  Filled 2021-07-18: qty 20

## 2021-07-18 MED ORDER — PENTAFLUOROPROP-TETRAFLUOROETH EX AERO
INHALATION_SPRAY | Freq: Once | CUTANEOUS | Status: AC
  Administered 2021-07-18: 30 via TOPICAL
  Filled 2021-07-18: qty 116

## 2021-07-18 MED ORDER — LIDOCAINE HCL 2 % IJ SOLN
20.0000 mL | Freq: Once | INTRAMUSCULAR | Status: AC
  Administered 2021-07-18: 400 mg
  Filled 2021-07-18: qty 20

## 2021-07-18 NOTE — Progress Notes (Signed)
Safety precautions to be maintained throughout the outpatient stay will include: orient to surroundings, keep bed in low position, maintain call bell within reach at all times, provide assistance with transfer out of bed and ambulation.  

## 2021-07-18 NOTE — Progress Notes (Signed)
50 cc D5W given IV  DW

## 2021-07-18 NOTE — Patient Instructions (Signed)
____________________________________________________________________________________________  Virtual Visits   What is a "Virtual Visit"? It is a healthcare communication encounter (medical visit) that takes place on real time (NOT TEXT or E-MAIL) over the telephone or computer device (desktop, laptop, tablet, smart phone, etc.). It allows for more location flexibility between the patient and the healthcare provider.  Who decides when these types of visits will be used? The physician.  Who is eligible for these types of visits? Only those patients that can be reliably reached over the telephone.  What do you mean by reliably? We do not have time to call everyone multiple times, therefore those that tend to screen calls and then call back later are not suitable candidates for this system. We understand how people are reluctant to pickup on "unknown" calls, therefore, we suggest adding our telephone numbers to your list of "CONTACT(s)". This way, you should be able to readily identify our calls when you receive one. All of our numbers are available below.   Who is not eligible? This option is not available for medication management encounters, specially for controlled substances. Patients on pain medications that fall under the category of controlled substances have to come in for "Face-to-Face" encounters. This is required for mandatory monitoring of these substances. You may be asked to provide a sample for an unannounced urine drug screening test (UDS), and we will need to count your pain pills. Not bringing your pills to be counted may result in no refill. Obviously, neither one of these can be done over the phone.  When will this type of visits be used? You can request a virtual visit whenever you are physically unable to attend a regular appointment. The decision will be made by the physician (or healthcare provider) on a case by case basis.   At what time will I be called? This is an  excellent question. The providers will try to call you whenever they have time available. Do not expect to be called at any specific time. The secretaries will assign you a time for your virtual visit appointment, but this is done simply to keep a list of those patients that need to be called, but not for the purpose of keeping a time schedule. Be advised that the call may come in anytime during the day, between the hours of 8:00 AM and 8::00 PM, depending on provider availability. We do understand that the system is not perfect. If you are unable to be available that day on a moments notice, then request an "in-person" appointment rather than a "virtual visit".  Can I request my medication visits to be "Virtual"? Yes you may request it, but the decision is entirely up to the healthcare provider. Control substances require specific monitoring that requires Face-to-Face encounters. The number of encounters  and the extent of the monitoring is determined on a case by case basis.  Add a new contact to your smart phone and label it "PAIN CLINIC" Under this contact add the following numbers: Main: [phone] (Official Contact Number) Nurses: [phone] (These are outgoing only calling systems. Do not call this number.) Dr. Dalyce Renne: [phone] or [phone] (Outgoing calls only. Do not call this number.)  ____________________________________________________________________________________________   ____________________________________________________________________________________________  Post-Procedure Discharge Instructions  Instructions: Apply ice:  Purpose: This will minimize any swelling and discomfort after procedure.  When: Day of procedure, as soon as you get home. How: Fill a plastic sandwich bag with crushed ice. Cover it with a small towel and apply to   injection site. How long: (15 min on, 15 min off) Apply for 15 minutes then remove x 15 minutes.  Repeat sequence on day  of procedure, until you go to bed. Apply heat:  Purpose: To treat any soreness and discomfort from the procedure. When: Starting the next day after the procedure. How: Apply heat to procedure site starting the day following the procedure. How long: May continue to repeat daily, until discomfort goes away. Food intake: Start with clear liquids (like water) and advance to regular food, as tolerated.  Physical activities: Keep activities to a minimum for the first 8 hours after the procedure. After that, then as tolerated. Driving: If you have received any sedation, be responsible and do not drive. You are not allowed to drive for 24 hours after having sedation. Blood thinner: (Applies only to those taking blood thinners) You may restart your blood thinner 6 hours after your procedure. Insulin: (Applies only to Diabetic patients taking insulin) As soon as you can eat, you may resume your normal dosing schedule. Infection prevention: Keep procedure site clean and dry. Shower daily and clean area with soap and water. Post-procedure Pain Diary: Extremely important that this be done correctly and accurately. Recorded information will be used to determine the next step in treatment. For the purpose of accuracy, follow these rules: Evaluate only the area treated. Do not report or include pain from an untreated area. For the purpose of this evaluation, ignore all other areas of pain, except for the treated area. After your procedure, avoid taking a long nap and attempting to complete the pain diary after you wake up. Instead, set your alarm clock to go off every hour, on the hour, for the initial 8 hours after the procedure. Document the duration of the numbing medicine, and the relief you are getting from it. Do not go to sleep and attempt to complete it later. It will not be accurate. If you received sedation, it is likely that you were given a medication that may cause amnesia. Because of this, completing  the diary at a later time may cause the information to be inaccurate. This information is needed to plan your care. Follow-up appointment: Keep your post-procedure follow-up evaluation appointment after the procedure (usually 2 weeks for most procedures, 6 weeks for radiofrequencies). DO NOT FORGET to bring you pain diary with you.   Expect: (What should I expect to see with my procedure?) From numbing medicine (AKA: Local Anesthetics): Numbness or decrease in pain. You may also experience some weakness, which if present, could last for the duration of the local anesthetic. Onset: Full effect within 15 minutes of injected. Duration: It will depend on the type of local anesthetic used. On the average, 1 to 8 hours.  From steroids (Applies only if steroids were used): Decrease in swelling or inflammation. Once inflammation is improved, relief of the pain will follow. Onset of benefits: Depends on the amount of swelling present. The more swelling, the longer it will take for the benefits to be seen. In some cases, up to 10 days. Duration: Steroids will stay in the system x 2 weeks. Duration of benefits will depend on multiple posibilities including persistent irritating factors. Side-effects: If present, they may typically last 2 weeks (the duration of the steroids). Frequent: Cramps (if they occur, drink Gatorade and take over-the-counter Magnesium 450-500 mg once to twice a day); water retention with temporary weight gain; increases in blood sugar; decreased immune system response; increased appetite. Occasional: Facial flushing (red,   warm cheeks); mood swings; menstrual changes. Uncommon: Long-term decrease or suppression of natural hormones; bone thinning. (These are more common with higher doses or more frequent use. This is why we prefer that our patients avoid having any injection therapies in other practices.)  Very Rare: Severe mood changes; psychosis; aseptic necrosis. From procedure: Some  discomfort is to be expected once the numbing medicine wears off. This should be minimal if ice and heat are applied as instructed.  Call if: (When should I call?) You experience numbness and weakness that gets worse with time, as opposed to wearing off. New onset bowel or bladder incontinence. (Applies only to procedures done in the spine)  Emergency Numbers: Durning business hours (Monday - Thursday, 8:00 AM - 4:00 PM) (Friday, 9:00 AM - 12:00 Noon): [phone] After hours: [phone] NOTE: If you are having a problem and are unable connect with, or to talk to a provider, then go to your nearest urgent care or emergency department. If the problem is serious and urgent, please call 911. ____________________________________________________________________________________________   

## 2021-07-19 ENCOUNTER — Telehealth: Payer: Self-pay | Admitting: *Deleted

## 2021-07-19 NOTE — Telephone Encounter (Signed)
No problems post procedure. 

## 2021-07-23 ENCOUNTER — Emergency Department: Payer: Medicare HMO

## 2021-07-23 ENCOUNTER — Other Ambulatory Visit: Payer: Self-pay

## 2021-07-23 ENCOUNTER — Inpatient Hospital Stay
Admission: EM | Admit: 2021-07-23 | Discharge: 2021-07-26 | DRG: 062 | Disposition: A | Discharge status: Home Health | Payer: Medicare HMO | Attending: Internal Medicine | Admitting: Internal Medicine

## 2021-07-23 DIAGNOSIS — E538 Deficiency of other specified B group vitamins: Secondary | ICD-10-CM | POA: Diagnosis present

## 2021-07-23 DIAGNOSIS — R42 Dizziness and giddiness: Secondary | ICD-10-CM | POA: Diagnosis not present

## 2021-07-23 DIAGNOSIS — I130 Hypertensive heart and chronic kidney disease with heart failure and stage 1 through stage 4 chronic kidney disease, or unspecified chronic kidney disease: Secondary | ICD-10-CM | POA: Diagnosis present

## 2021-07-23 DIAGNOSIS — H53461 Homonymous bilateral field defects, right side: Secondary | ICD-10-CM | POA: Diagnosis present

## 2021-07-23 DIAGNOSIS — I6389 Other cerebral infarction: Secondary | ICD-10-CM | POA: Diagnosis not present

## 2021-07-23 DIAGNOSIS — I2510 Atherosclerotic heart disease of native coronary artery without angina pectoris: Secondary | ICD-10-CM | POA: Diagnosis present

## 2021-07-23 DIAGNOSIS — H539 Unspecified visual disturbance: Secondary | ICD-10-CM | POA: Diagnosis not present

## 2021-07-23 DIAGNOSIS — J449 Chronic obstructive pulmonary disease, unspecified: Secondary | ICD-10-CM | POA: Diagnosis present

## 2021-07-23 DIAGNOSIS — Z794 Long term (current) use of insulin: Secondary | ICD-10-CM

## 2021-07-23 DIAGNOSIS — F05 Delirium due to known physiological condition: Secondary | ICD-10-CM | POA: Diagnosis not present

## 2021-07-23 DIAGNOSIS — E11649 Type 2 diabetes mellitus with hypoglycemia without coma: Secondary | ICD-10-CM | POA: Diagnosis not present

## 2021-07-23 DIAGNOSIS — H5461 Unqualified visual loss, right eye, normal vision left eye: Secondary | ICD-10-CM | POA: Diagnosis present

## 2021-07-23 DIAGNOSIS — F0280 Dementia in other diseases classified elsewhere without behavioral disturbance: Secondary | ICD-10-CM | POA: Diagnosis not present

## 2021-07-23 DIAGNOSIS — H53462 Homonymous bilateral field defects, left side: Secondary | ICD-10-CM | POA: Diagnosis not present

## 2021-07-23 DIAGNOSIS — G8929 Other chronic pain: Secondary | ICD-10-CM | POA: Diagnosis present

## 2021-07-23 DIAGNOSIS — R4701 Aphasia: Secondary | ICD-10-CM | POA: Diagnosis present

## 2021-07-23 DIAGNOSIS — Z8673 Personal history of transient ischemic attack (TIA), and cerebral infarction without residual deficits: Secondary | ICD-10-CM

## 2021-07-23 DIAGNOSIS — E1169 Type 2 diabetes mellitus with other specified complication: Secondary | ICD-10-CM | POA: Diagnosis present

## 2021-07-23 DIAGNOSIS — N181 Chronic kidney disease, stage 1: Secondary | ICD-10-CM | POA: Diagnosis present

## 2021-07-23 DIAGNOSIS — I672 Cerebral atherosclerosis: Secondary | ICD-10-CM | POA: Diagnosis not present

## 2021-07-23 DIAGNOSIS — G309 Alzheimer's disease, unspecified: Secondary | ICD-10-CM | POA: Diagnosis present

## 2021-07-23 DIAGNOSIS — F0153 Vascular dementia, unspecified severity, with mood disturbance: Secondary | ICD-10-CM | POA: Diagnosis not present

## 2021-07-23 DIAGNOSIS — Z9861 Coronary angioplasty status: Secondary | ICD-10-CM

## 2021-07-23 DIAGNOSIS — E1122 Type 2 diabetes mellitus with diabetic chronic kidney disease: Secondary | ICD-10-CM | POA: Diagnosis not present

## 2021-07-23 DIAGNOSIS — I639 Cerebral infarction, unspecified: Principal | ICD-10-CM

## 2021-07-23 DIAGNOSIS — Z823 Family history of stroke: Secondary | ICD-10-CM

## 2021-07-23 DIAGNOSIS — I6503 Occlusion and stenosis of bilateral vertebral arteries: Secondary | ICD-10-CM | POA: Diagnosis not present

## 2021-07-23 DIAGNOSIS — N3281 Overactive bladder: Secondary | ICD-10-CM | POA: Diagnosis present

## 2021-07-23 DIAGNOSIS — Z7902 Long term (current) use of antithrombotics/antiplatelets: Secondary | ICD-10-CM

## 2021-07-23 DIAGNOSIS — I63532 Cerebral infarction due to unspecified occlusion or stenosis of left posterior cerebral artery: Secondary | ICD-10-CM | POA: Diagnosis not present

## 2021-07-23 DIAGNOSIS — Z87891 Personal history of nicotine dependence: Secondary | ICD-10-CM

## 2021-07-23 DIAGNOSIS — Z79899 Other long term (current) drug therapy: Secondary | ICD-10-CM

## 2021-07-23 DIAGNOSIS — E785 Hyperlipidemia, unspecified: Secondary | ICD-10-CM | POA: Diagnosis present

## 2021-07-23 DIAGNOSIS — K219 Gastro-esophageal reflux disease without esophagitis: Secondary | ICD-10-CM | POA: Diagnosis present

## 2021-07-23 DIAGNOSIS — F0150 Vascular dementia without behavioral disturbance: Secondary | ICD-10-CM | POA: Diagnosis present

## 2021-07-23 DIAGNOSIS — I5032 Chronic diastolic (congestive) heart failure: Secondary | ICD-10-CM | POA: Diagnosis present

## 2021-07-23 DIAGNOSIS — D649 Anemia, unspecified: Secondary | ICD-10-CM | POA: Diagnosis present

## 2021-07-23 DIAGNOSIS — Z7984 Long term (current) use of oral hypoglycemic drugs: Secondary | ICD-10-CM

## 2021-07-23 DIAGNOSIS — Z7982 Long term (current) use of aspirin: Secondary | ICD-10-CM

## 2021-07-23 DIAGNOSIS — R4182 Altered mental status, unspecified: Secondary | ICD-10-CM | POA: Diagnosis not present

## 2021-07-23 DIAGNOSIS — G4733 Obstructive sleep apnea (adult) (pediatric): Secondary | ICD-10-CM | POA: Diagnosis present

## 2021-07-23 DIAGNOSIS — I6523 Occlusion and stenosis of bilateral carotid arteries: Secondary | ICD-10-CM | POA: Diagnosis not present

## 2021-07-23 DIAGNOSIS — E1140 Type 2 diabetes mellitus with diabetic neuropathy, unspecified: Secondary | ICD-10-CM | POA: Diagnosis present

## 2021-07-23 DIAGNOSIS — N400 Enlarged prostate without lower urinary tract symptoms: Secondary | ICD-10-CM | POA: Diagnosis present

## 2021-07-23 DIAGNOSIS — R519 Headache, unspecified: Secondary | ICD-10-CM | POA: Diagnosis not present

## 2021-07-23 DIAGNOSIS — Z886 Allergy status to analgesic agent status: Secondary | ICD-10-CM

## 2021-07-23 DIAGNOSIS — F0283 Dementia in other diseases classified elsewhere, unspecified severity, with mood disturbance: Secondary | ICD-10-CM | POA: Diagnosis present

## 2021-07-23 DIAGNOSIS — Z981 Arthrodesis status: Secondary | ICD-10-CM

## 2021-07-23 DIAGNOSIS — R29702 NIHSS score 2: Secondary | ICD-10-CM | POA: Diagnosis present

## 2021-07-23 DIAGNOSIS — Z885 Allergy status to narcotic agent status: Secondary | ICD-10-CM

## 2021-07-23 DIAGNOSIS — Z6841 Body Mass Index (BMI) 40.0 and over, adult: Secondary | ICD-10-CM

## 2021-07-23 DIAGNOSIS — Z8042 Family history of malignant neoplasm of prostate: Secondary | ICD-10-CM

## 2021-07-23 LAB — DIFFERENTIAL
Abs Immature Granulocytes: 0.03 10*3/uL (ref 0.00–0.07)
Basophils Absolute: 0 10*3/uL (ref 0.0–0.1)
Basophils Relative: 0 %
Eosinophils Absolute: 0.1 10*3/uL (ref 0.0–0.5)
Eosinophils Relative: 1 %
Immature Granulocytes: 0 %
Lymphocytes Relative: 17 %
Lymphs Abs: 1.1 10*3/uL (ref 0.7–4.0)
Monocytes Absolute: 0.8 10*3/uL (ref 0.1–1.0)
Monocytes Relative: 12 %
Neutro Abs: 4.7 10*3/uL (ref 1.7–7.7)
Neutrophils Relative %: 70 %

## 2021-07-23 LAB — COMPREHENSIVE METABOLIC PANEL WITH GFR
ALT: 20 U/L (ref 0–44)
AST: 22 U/L (ref 15–41)
Albumin: 4.1 g/dL (ref 3.5–5.0)
Alkaline Phosphatase: 40 U/L (ref 38–126)
Anion gap: 10 (ref 5–15)
BUN: 21 mg/dL (ref 8–23)
CO2: 26 mmol/L (ref 22–32)
Calcium: 9.4 mg/dL (ref 8.9–10.3)
Chloride: 101 mmol/L (ref 98–111)
Creatinine, Ser: 0.87 mg/dL (ref 0.61–1.24)
GFR, Estimated: >60 mL/min
Glucose, Bld: 142 mg/dL — ABNORMAL HIGH (ref 70–99)
Potassium: 4.6 mmol/L (ref 3.5–5.1)
Sodium: 137 mmol/L (ref 135–145)
Total Bilirubin: 0.9 mg/dL (ref 0.3–1.2)
Total Protein: 6.8 g/dL (ref 6.5–8.1)

## 2021-07-23 LAB — GLUCOSE, CAPILLARY
Glucose-Capillary: 62 mg/dL — ABNORMAL LOW (ref 70–99)
Glucose-Capillary: 68 mg/dL — ABNORMAL LOW (ref 70–99)
Glucose-Capillary: 93 mg/dL (ref 70–99)
Glucose-Capillary: 99 mg/dL (ref 70–99)

## 2021-07-23 LAB — CBC
HCT: 36.9 % — ABNORMAL LOW (ref 39.0–52.0)
Hemoglobin: 11.9 g/dL — ABNORMAL LOW (ref 13.0–17.0)
MCH: 29.8 pg (ref 26.0–34.0)
MCHC: 32.2 g/dL (ref 30.0–36.0)
MCV: 92.3 fL (ref 80.0–100.0)
Platelets: 309 10*3/uL (ref 150–400)
RBC: 4 MIL/uL — ABNORMAL LOW (ref 4.22–5.81)
RDW: 15 % (ref 11.5–15.5)
WBC: 6.8 10*3/uL (ref 4.0–10.5)
nRBC: 0 % (ref 0.0–0.2)

## 2021-07-23 LAB — MAGNESIUM: Magnesium: 1.5 mg/dL — ABNORMAL LOW (ref 1.7–2.4)

## 2021-07-23 LAB — CBG MONITORING, ED: Glucose-Capillary: 145 mg/dL — ABNORMAL HIGH (ref 70–99)

## 2021-07-23 LAB — PROTIME-INR
INR: 1 (ref 0.8–1.2)
Prothrombin Time: 12.7 seconds (ref 11.4–15.2)

## 2021-07-23 LAB — ETHANOL: Alcohol, Ethyl (B): <10 mg/dL

## 2021-07-23 LAB — PHOSPHORUS: Phosphorus: 3.8 mg/dL (ref 2.5–4.6)

## 2021-07-23 LAB — APTT: aPTT: 29 seconds (ref 24–36)

## 2021-07-23 MED ORDER — SODIUM CHLORIDE 0.9% FLUSH
3.0000 mL | Freq: Once | INTRAVENOUS | Status: AC
  Administered 2021-07-23: 3 mL via INTRAVENOUS

## 2021-07-23 MED ORDER — POLYETHYLENE GLYCOL 3350 17 G PO PACK: 17.0000 g | PACK | Freq: Every day | ORAL | Status: DC | PRN

## 2021-07-23 MED ORDER — LACTATED RINGERS IV SOLN: INTRAVENOUS | Status: AC

## 2021-07-23 MED ORDER — STROKE: EARLY STAGES OF RECOVERY BOOK: Freq: Once | Status: AC

## 2021-07-23 MED ORDER — IOHEXOL 350 MG/ML SOLN
75.0000 mL | Freq: Once | INTRAVENOUS | Status: AC | PRN
  Administered 2021-07-23: 75 mL via INTRAVENOUS

## 2021-07-23 MED ORDER — DULOXETINE HCL 30 MG PO CPEP
60.0000 mg | ORAL_CAPSULE | Freq: Every day | ORAL | Status: DC
  Administered 2021-07-24 – 2021-07-25 (×2): 60 mg via ORAL
  Filled 2021-07-23 (×2): qty 1
  Filled 2021-07-23: qty 2

## 2021-07-23 MED ORDER — INSULIN ASPART 100 UNIT/ML IJ SOLN
0.0000 [IU] | INTRAMUSCULAR | Status: DC
  Administered 2021-07-24 (×2): 3 [IU] via SUBCUTANEOUS
  Administered 2021-07-25: 5 [IU] via SUBCUTANEOUS
  Administered 2021-07-25 (×2): 2 [IU] via SUBCUTANEOUS
  Administered 2021-07-25 (×2): 3 [IU] via SUBCUTANEOUS
  Filled 2021-07-23 (×7): qty 1

## 2021-07-23 MED ORDER — CALCIUM CARBONATE 1250 (500 CA) MG PO TABS
500.0000 mg | ORAL_TABLET | Freq: Every day | ORAL | Status: DC
  Administered 2021-07-24 – 2021-07-26 (×3): 1250 mg via ORAL
  Filled 2021-07-23 (×3): qty 1

## 2021-07-23 MED ORDER — MAGNESIUM SULFATE 4 GM/100ML IV SOLN
4.0000 g | Freq: Once | INTRAVENOUS | Status: AC
  Administered 2021-07-23: 4 g via INTRAVENOUS
  Filled 2021-07-23: qty 100

## 2021-07-23 MED ORDER — FINASTERIDE 5 MG PO TABS
5.0000 mg | ORAL_TABLET | Freq: Every day | ORAL | Status: DC
  Administered 2021-07-24 – 2021-07-26 (×3): 5 mg via ORAL
  Filled 2021-07-23 (×3): qty 1

## 2021-07-23 MED ORDER — BUTALBITAL-APAP-CAFFEINE 50-325-40 MG PO TABS
1.0000 | ORAL_TABLET | Freq: Four times a day (QID) | ORAL | Status: DC | PRN
Start: 2021-07-23 — End: 2021-07-23

## 2021-07-23 MED ORDER — TENECTEPLASE FOR STROKE
25.0000 mg | PACK | Freq: Once | INTRAVENOUS | Status: AC
Start: 2021-07-23 — End: 2021-07-23
  Administered 2021-07-23: 25 mg via INTRAVENOUS

## 2021-07-23 MED ORDER — PANTOPRAZOLE SODIUM 40 MG IV SOLR
40.0000 mg | Freq: Every day | INTRAVENOUS | Status: DC
  Administered 2021-07-23 – 2021-07-25 (×3): 40 mg via INTRAVENOUS
  Filled 2021-07-23 (×3): qty 10

## 2021-07-23 MED ORDER — DOCUSATE SODIUM 100 MG PO CAPS: 100.0000 mg | ORAL_CAPSULE | Freq: Two times a day (BID) | ORAL | Status: DC | PRN

## 2021-07-23 MED ORDER — LABETALOL HCL 5 MG/ML IV SOLN: 10.0000 mg | INTRAVENOUS | Status: DC | PRN

## 2021-07-23 MED ORDER — SUMATRIPTAN SUCCINATE 50 MG PO TABS
25.0000 mg | ORAL_TABLET | ORAL | Status: DC | PRN
Start: 2021-07-23 — End: 2021-07-24
  Administered 2021-07-23 – 2021-07-24 (×2): 25 mg via ORAL
  Filled 2021-07-23 (×3): qty 1

## 2021-07-23 MED ORDER — LACTATED RINGERS IV BOLUS
500.0000 mL | Freq: Once | INTRAVENOUS | Status: AC
  Administered 2021-07-23: 500 mL via INTRAVENOUS

## 2021-07-23 MED ORDER — BUTALBITAL-APAP-CAFFEINE 50-325-40 MG PO TABS: 2.0000 | ORAL_TABLET | Freq: Four times a day (QID) | ORAL | Status: DC | PRN

## 2021-07-23 MED ORDER — MAGNESIUM OXIDE 400 MG PO TABS
400.0000 mg | ORAL_TABLET | Freq: Every day | ORAL | Status: DC
  Administered 2021-07-24 – 2021-07-26 (×3): 400 mg via ORAL
  Filled 2021-07-23 (×6): qty 1

## 2021-07-23 MED ORDER — LOSARTAN POTASSIUM 50 MG PO TABS
50.0000 mg | ORAL_TABLET | Freq: Every day | ORAL | Status: DC
  Administered 2021-07-24 – 2021-07-26 (×3): 50 mg via ORAL
  Filled 2021-07-23 (×3): qty 1

## 2021-07-23 MED ORDER — TAMSULOSIN HCL 0.4 MG PO CAPS
0.4000 mg | ORAL_CAPSULE | Freq: Every day | ORAL | Status: DC
  Administered 2021-07-24 – 2021-07-25 (×2): 0.4 mg via ORAL
  Filled 2021-07-23 (×2): qty 1

## 2021-07-23 MED ORDER — GABAPENTIN 300 MG PO CAPS
300.0000 mg | ORAL_CAPSULE | Freq: Two times a day (BID) | ORAL | Status: DC
  Administered 2021-07-24 – 2021-07-26 (×4): 300 mg via ORAL
  Filled 2021-07-23 (×4): qty 1

## 2021-07-23 MED ORDER — INSULIN DETEMIR 100 UNIT/ML ~~LOC~~ SOLN: 25.0000 [IU] | Freq: Every day | SUBCUTANEOUS | Status: DC

## 2021-07-23 MED ORDER — ATORVASTATIN CALCIUM 20 MG PO TABS
40.0000 mg | ORAL_TABLET | Freq: Every evening | ORAL | Status: DC
  Administered 2021-07-23: 40 mg via ORAL
  Filled 2021-07-23: qty 2

## 2021-07-23 MED ORDER — SENNOSIDES-DOCUSATE SODIUM 8.6-50 MG PO TABS: 1.0000 | ORAL_TABLET | Freq: Every evening | ORAL | Status: DC | PRN

## 2021-07-23 MED ORDER — DONEPEZIL HCL 5 MG PO TABS
10.0000 mg | ORAL_TABLET | Freq: Every evening | ORAL | Status: DC
  Administered 2021-07-23 – 2021-07-25 (×3): 10 mg via ORAL
  Filled 2021-07-23 (×4): qty 2

## 2021-07-23 MED ORDER — TIOTROPIUM BROMIDE MONOHYDRATE 18 MCG IN CAPS
2.0000 | ORAL_CAPSULE | Freq: Every day | RESPIRATORY_TRACT | Status: DC
Start: 2021-07-23 — End: 2021-07-26
  Administered 2021-07-23 – 2021-07-24 (×2): 36 ug via RESPIRATORY_TRACT
  Filled 2021-07-23: qty 5

## 2021-07-23 MED ORDER — VITAMIN B-12 1000 MCG PO TABS
1000.0000 ug | ORAL_TABLET | Freq: Every day | ORAL | Status: DC
Start: 2021-07-24 — End: 2021-07-26
  Administered 2021-07-24 – 2021-07-26 (×3): 1000 ug via ORAL
  Filled 2021-07-23 (×3): qty 1

## 2021-07-23 MED ORDER — ADULT MULTIVITAMIN W/MINERALS CH
1.0000 | ORAL_TABLET | Freq: Every day | ORAL | Status: DC
  Administered 2021-07-24 – 2021-07-26 (×3): 1 via ORAL
  Filled 2021-07-23 (×3): qty 1

## 2021-07-23 MED ORDER — INSULIN DETEMIR 100 UNIT/ML ~~LOC~~ SOLN
10.0000 [IU] | Freq: Every day | SUBCUTANEOUS | Status: DC
  Administered 2021-07-24 – 2021-07-26 (×3): 10 [IU] via SUBCUTANEOUS
  Filled 2021-07-23 (×3): qty 0.1

## 2021-07-23 NOTE — ED Triage Notes (Signed)
Pt comes with c/o possible stroke. Pt states at 1130 he started to have visual disturbances. Pt states vision is still blurry. Pt stats headache and dizziness. Pt states severe headache.  Pt states weakness and dizziness. Pt is on thinners no falls recently. Pt has hx of stoke.

## 2021-07-23 NOTE — ED Notes (Signed)
Code stroke called to Network engineer. Pt taken to CT at this time.

## 2021-07-23 NOTE — ED Notes (Signed)
Patient arrived in room from CT

## 2021-07-23 NOTE — H&P (Cosign Needed Addendum)
NAME:  SALAM MICUCCI, MRN:  [id number], DOB:  [birth year], LOS: 0 ADMISSION DATE:  07/23/2021, CONSULTATION DATE:  07/23/21 REFERRING MD:  Dr. Joni Fears, CHIEF COMPLAINT:   CODE STROKE  History of Present Illness:  81 yo M presenting to [facility] ED from home where he lives with his wife with complaints that at 11:30 on 07/23/21 he started having visual disturbances.  History provided by wife and patient bedside. He complained of blurry vision & was unable to see the LEFT half of his cell phone screen, severe headache and dizziness. There was an initial delay getting to the ED due to the patient's reluctance to come after his wife suggested it.  The patient has chronic headaches and takes Excedrin multiple times a day, however both wife and patient agreed that his headache today appeared more severe. At baseline the patient is able to ambulate with a walker, and has generalized BLE weakness due to chronic pain in lumber spine. He recently received a lumbar nerve block 2 weeks ago, which has stopped the "shooting" pain and has left him with only some aching in the lumbar region. He also was recently diagnosed with dementia. Wife reports that currently his only symptom is forgetfulness. They deny any other symptoms or recent illnesses. Patient quit tobacco use 40 years ago, drinks socially which has decreased as of late and denies any recreational drug use.  ED course: Upon arrival, per ED documentation, patient complaining of blurry vision, severe headache and dizziness that has remained constant since 11:30. CODE STROKE initiated. Patient denied trauma, fevers, seizure like activity, syncope or neck pain. He confirmed taking Plavix but no systemic anticoagulation.  Modified Rankin score: 0, Initial NIHSS: 3 with Neurologist exam demonstrating RIGHT homonymous hemianopsia and dysphasia/aphasia. Concern for posterior LEFT MCA territory vs less likely LEFT PCA territory. TNKase administered at 64:40 without  complication. Medications given: TNKase and IV contrast Initial Vitals: afebrile 97.8, 19, NSR 82, 135/45 & SpO2 100% on RA Significant labs: (Labs/ Imaging personally reviewed) I, Domingo Pulse Rust[place]ster, AGACNP-BC, personally viewed and interpreted this ECG. EKG Interpretation: Date: 07/23/21, EKG Time: 15:48, Rate: 92, Rhythm: NSR, QRS Axis: LAD, Intervals: normal, ST/T Wave abnormalities: normal,  Narrative Interpretation: NSR Chemistry: Na+: 137, K+: 4.6, BUN/Cr.: 21/0.87, Serum CO2/ AG: 26/10, glucose: 142 Hematology: WBC: 6.8, Hgb: 11.9, plt: 309 PT/INR: 12.7/ 1.0   CT head wo contrast 07/23/21: No acute intracranial abnormality or significant interval change. Stable atrophy and white matter disease. This likely reflects the sequela of chronic microvascular ischemia. Aspects 10/10. CT angio head/neck 07/23/21: Atherosclerotic calcifications at the carotid bifurcations bilaterally without significant stenosis of greater than 50% relative to the more distal vessels. Dense calcifications at the origins of both vertebral arteries with probable high-grade stenoses stenoses. Atherosclerotic calcifications at the dural margin of the left vertebral artery without significant stenosis. Atherosclerotic calcifications within the cavernous internal carotid arteries bilaterally without significant stenosis relative to the more distal vessel. Mild small vessel irregularity without a significant proximal stenosis, aneurysm, or branch vessel occlusion within the Circle of Willis. Multilevel degenerative changes in the cervical spine with anterior fusion at C5-6 and C6-7 is solid.  PCCM consulted for admission due to Suspected CVA status post TNKase administration.  Pertinent  Medical History  Late onset Dementia Anemia BPH COPD CAD s/p PCI Heart murmur T2DM Depression HLD HTN Obesity OSA on BIPAP CVA Rheumatic fever Vertigo  B12 deficiency  Significant Hospital Events: Including procedures,  antibiotic start and stop dates in addition to  other pertinent events   07/23/21: CODE STROKE, LKW 11:30, TNKase administered at 15:56. Admitted to ICU for monitoring  Interim History / Subjective:  Patient A&O, NIHSS: 2 (improved from 3), only current complaints is a headache 7/10 (reports chronic headaches 5-6/10) & chronic lumbar aching. Plan of care discussed in detail and all questions and concerns answered at this time.  Objective   Blood pressure (!) 146/70, pulse 77, temperature 97.8 F (36.6 C), resp. rate (!) 25, SpO2 96 %.       No intake or output data in the 24 hours ending 07/23/21 1932 There were no vitals filed for this visit.  Examination: General: Adult male, critically ill, lying in bed, NAD HEENT: MM pink/moist, anicteric, atraumatic, neck supple Neuro: A&O x 4 able to follow commands, PERRL +3, MAE  Neurological Exam Level of arousal and orientation to time, place, and person were intact. Language including expression, naming, repetition, comprehension was assessed and found intact. Attention span and concentration were normal Recent and remote memory were intact Fund of Knowledge was assessed and was intact CN: II - RIGHT homonymous hemianopsia noted III, IV, VI - Extraocular movements intact. V - Facial sensation intact bilaterally. VII - Facial movement intact bilaterally VIII - HOH X - Palate elevates symmetrically XI - Chin turning & shoulder shrug intact bilaterally. XII - Tongue protrusion intact Motor Strength - strength 5/5 all extremities and pronator drift was absent.  Sensory - Light touch was assessed and was symmetrical Coordination - The patient had normal movements in the hands and feet with no ataxia or dysmetria.  Tremor was absent Gait and Station - deferred.  NIHSS Level of  Consciousness: 0 = Alert, keenly responsive; 1 = Not alert, but arousable by minor stimulation to obey, answer, or respond; 2 = Not alert, requires repeated  stimulation to attend, or is obtunded and requires strong or painful stimulation to make movements (not stereotyped); 3 = Responds only with reflex motor or autonomic effects or totally unresponsive, flaccid, and areflexic   0  LOC Questions 0 = Answers both questions correctly; 1 = Answers one question correctly; 2 = Answers neither question correctly.  0  LOC Commands 0 = Performs both tasks correctly; 1 = Performs one task correctly; 2 = Performs neither task correctly  0  Best Gaze  0 = Normal; 1 = Partial gaze palsy; gaze is abnormal in one or both eyes, but forced deviation or total gaze paresis is not present; 2 = Forced deviation, or total gaze paresis not overcome by the oculocephalic maneuver   0  Visual 0 = No visual loss; 1 = Partial hemianopia; 2 = Complete hemianopia; 3 = Bilateral hemianopia (blind including cortical blindness).   1  Facial Palsy 0 = Normal symmetrical movements; 1 = Minor paralysis (flattened nasolabial fold, asymmetry on smiling); 2 = Partial paralysis (total or near-total paralysis of lower face); 3 = Complete paralysis of one or both sides (absence of facial movement in the upper and lower face).  0  Motor Arm LEFT 0 = No drift; limb holds 90 (or 45) degrees for full 10 secs; 1 = Drift; limb holds 90 (or 45) degrees, but drifts down before full 10 seconds; does not hit bed or other support; 2 = Some effort against gravity; limb cannot get to or maintain (if cued) 90 (or 45) degrees, drifts down to bed, but has some effort against gravity; 3 = No effort against gravity; limb falls; 4 = No movement; UN =  unable to test (Amputation or joint fusion).   0  Motor Arm RIGHT 0 = No drift; limb holds 90 (or 45) degrees for full 10 secs; 1 = Drift; limb holds 90 (or 45) degrees, but drifts down before full 10 seconds; does not hit bed or other support; 2 = Some effort against gravity; limb cannot get to or maintain (if cued) 90 (or 45) degrees, drifts down to bed, but has some effort  against gravity; 3 = No effort against gravity; limb falls; 4 = No movement; UN = unable to test (Amputation or joint fusion).   0  Motor Leg LEFT 0 = No drift; leg holds 30 degree position for full 5 secs; 1 = Drift; leg falls by the end of the 5-sec period but does not hit bed; 2 = Some effort against gravity; leg falls to bed by 5 secs, but has some effort against gravity; 3 = No effort against gravity; leg falls to bed immediately; 4 = No movement; UN = unable to test (Amputation or joint fusion).   0  Motor Leg RIGHT 0 = No drift; leg holds 30 degree position for full 5 secs; 1 = Drift; leg falls by the end of the 5-sec period but does not hit bed; 2 = Some effort against gravity; leg falls to bed by 5 secs, but has some effort against gravity; 3 = No effort against gravity; leg falls to bed immediately; 4 = No movement; UN = unable to test (Amputation or joint fusion).   0  Limb Ataxia 0 = Absent; 1 = Present in one limb; 2 = Present in two limbs; UN = Amputation or joint fusion   0  Sensory  0 = Normal, no sensory loss; 1 = Mild-to-moderate sensory loss, patient feels pinprick is less sharp or is dull on the affected side, or there is a loss of superficial pain with pinprick, but patient is aware of being touched; 2 = Severe to total sensory loss, patient is not aware of being touched in the face, arm, and leg.   0  Best Language 0 = No aphasia; normal; 1 = Mild-to-moderate aphasia; some obvious loss of fluency or facility of comprehension, w/o significant limitation on ideas expressed or form of expression. Reduction of speech and/or comprehension, however, makes conversation about provided materials difficult or impossible. For example, in conversation about provided materials, examiner can identify picture or naming card content from patient's response; 2 = Severe aphasia; all communication is through fragmentary expression; great need for inference, questioning, and guessing by the listener. Range  of information that can be exchanged is limited; listener carries burden of communication. Examiner cannot identify materials provided from patient response; 3 = Mute, global aphasia; no usable speech or auditory comprehension  1  Dysarthria 0 = Normal; 1 = Mild-to-moderate dysarthria, patient slurs at least some words and, at worst, can be understood with some difficulty; 2 = Severe dysarthria, patient's speech is so slurred as to be unintelligible in the absence of or out of proportion to any dysphasia, or is mute/anarthric; UN = Intubated or other physical barrier  0  Extinction/Inattention 0 = No abnormality 1 = Visual, tactile, auditory, spatial, or personal extinction/inattention to bilateral simultaneous stimulation in one of the sensory modalities. 2 = Profound hemi-inattention or extinction to more than one modality; does not recognize own hand or orients to only one side of space.  0  Total   2    CV: s1s2 RRR, NSR  on monitor, no r/m/g Pulm: Regular, non labored on RA , breath sounds clear/diminished-BUL & diminished-BLL GI: soft, rounded, non tender, bs x 4 Skin: scattered ecchymosis,  no rashes/lesions noted Extremities: warm/dry, pulses + 2 R/P, no edema noted  Resolved Hospital Problem list     Assessment & Plan:  Suspected Ischemic Stroke in the setting of chronic microvascular disease PMHx: CVA (2018) & Dementia LKW: 11:30, TNKase administered @ 15:56 - 500 mL LR bolus followed by infusion over 5 hours to protect kidneys after IV contrast exposure - Neuro checks: Q 22mx 2 h, Q 351m 6 h, Q 1 h x 16 hours - NIHSS Q shift/ any change in neurological status - perform bedside swallow > SLP eval if needed - PT/OT consult - Neurology following, appreciate input - f/u MRI/MRA  & CTH in 24 hours - Echocardiogram ordered - f/u Lipid panel & Hgb A1c  -SCD's for VTE prophylaxis - aspirin 81 mg daily and clopidogrel 75 mg daily prior to admission- held for 24 hours post TNKase -  restart outpatient Aricept tomorrow  COPD without exacerbation OSA on BIPAP - BIPAP QHS ordered - supplemental O2 PRN to maintain SPO2 > 92% - continue outpatient Tiotropium inhaler, Bronchodilators PRN  Hypomagnesemia - supplementation provided - outpatient PO Mg ordered - daily BMP, replace electrolytes PRN  Type 2 Diabetes Mellitus Wife reports that the patient will have high's in the low 200's but lows in the 40's, Currently on metformin, glimepiride & insulin: Degludec-Liraglutide - HgbA1c pending, goal < 7.0, f/u in AM - Q 4 CBG monitoring with SSI - reduced long acting insulin to Levemir at 10 units daily, because of wife's description of hypoglycemia - target CBG 140-180 - follow ICU hypo/hyper-glycemia protocol  Hypertension Tight BP control overnight with goal < 180/105 - labetalol Q 2 PRN, start nicardipine drip as needed - losartan PO restarted in AM, as renal function labs allow  Hyperlipidemia LDL pending, goal < 70 - continue home regimen: Lipitor, consider increase PRN  BPH - continue outpatient Proscar & Flomax  Chronic Pain - Due to patient allergies and risk for bleeding, will try Imitrex Q 2 PRN for chronic headaches overnight - Restart outpatient regimen for pain/ nerve control tomorrow: Cymbalta, Gabapentin - offered Lidocaine patch, but both patient and wife state this has been ineffective in the past  Best Practice (right click and "Reselect all SmartList Selections" daily)  Diet/type: Regular consistency (see orders) DVT prophylaxis: SCD GI prophylaxis: PPI Lines: N/A Foley:  N/A Code Status:  full code Last date of multidisciplinary goals of care discussion [07/23/21]  Labs   CBC: Recent Labs  Lab 07/23/21 1537  WBC 6.8  NEUTROABS 4.7  HGB 11.9*  HCT 36.9*  MCV 92.3  PLT 30161  Basic Metabolic Panel: Recent Labs  Lab 07/23/21 1537  NA 137  K 4.6  CL 101  CO2 26  GLUCOSE 142*  BUN 21  CREATININE 0.87  CALCIUM 9.4    GFR: Estimated Creatinine Clearance: 83.2 mL/min (by C-G formula based on SCr of 0.87 mg/dL). Recent Labs  Lab 07/23/21 1537  WBC 6.8    Liver Function Tests: Recent Labs  Lab 07/23/21 1537  AST 22  ALT 20  ALKPHOS 40  BILITOT 0.9  PROT 6.8  ALBUMIN 4.1   No results for input(s): "LIPASE", "AMYLASE" in the last 168 hours. No results for input(s): "AMMONIA" in the last 168 hours.  ABG No results found for: "PHART", "PCO2ART", "PO2ART", "  HCO3", "TCO2", "ACIDBASEDEF", "O2SAT"   Coagulation Profile: Recent Labs  Lab 07/23/21 1537  INR 1.0    Cardiac Enzymes: No results for input(s): "CKTOTAL", "CKMB", "CKMBINDEX", "TROPONINI" in the last 168 hours.  HbA1C: Hgb A1c MFr Bld  Date/Time Value Ref Range Status  01/31/2016 04:48 AM 6.6 (H) 4.8 - 5.6 % Final    Comment:    (NOTE)         Pre-diabetes: 5.7 - 6.4         Diabetes: >6.4         Glycemic control for adults with diabetes: <7.0     CBG: Recent Labs  Lab 07/18/21 0923 07/18/21 0943 07/18/21 1002 07/18/21 1042 07/23/21 1526  GLUCAP 62* 64* 86 87 145*    Review of Systems: Positives in BOLD  Gen: Denies fever, chills, weight change, fatigue, night sweats HEENT: Denies blurred vision, double vision, hearing loss, tinnitus, sinus congestion, rhinorrhea, sore throat, neck stiffness, dysphagia PULM: Denies shortness of breath, cough, sputum production, hemoptysis, wheezing CV: Denies chest pain, edema, orthopnea, paroxysmal nocturnal dyspnea, palpitations GI: Denies abdominal pain, nausea, vomiting, diarrhea, hematochezia, melena, constipation, change in bowel habits GU: Denies dysuria, hematuria, polyuria, oliguria, urethral discharge Endocrine: Denies hot or cold intolerance, polyuria, polyphagia or appetite change Derm: Denies rash, dry skin, scaling or peeling skin change Heme: Denies easy bruising, bleeding, bleeding gums Neuro: Denies headache, numbness, weakness, slurred speech, loss of memory  or consciousness  Past Medical History:  He,  has a past medical history of Allergy, Anemia, Arthritis, Back pain, Barrett esophagus, BPH (benign prostatic hyperplasia), Chronic kidney disease, COPD (chronic obstructive pulmonary disease) ([place]), Coronary artery disease, Depression, Diabetes mellitus without complication ([place]), Diverticulitis, Dysphagia, Esophageal reflux, Esophageal reflux, GERD (gastroesophageal reflux disease), Heart murmur, Hyperlipidemia, Hypersomnia, Hypertension, Kidney stones, Low testosterone, OAB (overactive bladder), Obesity, Presence of dental bridge, Rheumatic fever, Sleep apnea, Stroke ([place]) (01/2016), Vertigo, and Vitamin B12 deficiency.   Surgical History:   Past Surgical History:  Procedure Laterality Date   BACK SURGERY     CARDIAC CATHETERIZATION  08/11/2014   Procedure: Left Heart Cath and Coronary Angiography;  Surgeon: Teodoro Spray, MD;  Location: [facility];  Service: Cardiovascular;;   CARDIAC CATHETERIZATION N/A 11/17/2014   Procedure: Right Heart Cath;  Surgeon: Teodoro Spray, MD;  Location: [facility] [facility];  Service: Cardiovascular;  Laterality: N/A;   CAROTID STENT     CATARACT EXTRACTION W/PHACO Left 04/03/2016   Procedure: CATARACT EXTRACTION PHACO AND INTRAOCULAR LENS PLACEMENT ([place]) left diabetic;  Surgeon: Eulogio Bear, MD;  Location: [facility];  Service: Ophthalmology;  Laterality: Left;  diabetic - oral meds sleep apnea   CATARACT EXTRACTION W/PHACO Right 05/01/2016   Procedure: CATARACT EXTRACTION PHACO AND INTRAOCULAR LENS PLACEMENT ([place]) Right diabetic;  Surgeon: Eulogio Bear, MD;  Location: [facility];  Service: Ophthalmology;  Laterality: Right;  Diabetic oral meds sleep apnea   CERVICAL SPINE SURGERY     COLONOSCOPY     COLONOSCOPY WITH PROPOFOL N/A 06/10/2015   Procedure: COLONOSCOPY WITH PROPOFOL;  Surgeon: Manya Silvas, MD;  Location: [facility] ENDOSCOPY;  Service: Endoscopy;  Laterality: N/A;    CORONARY ANGIOPLASTY     ESOPHAGOGASTRODUODENOSCOPY (EGD) WITH PROPOFOL N/A 06/28/2017   Procedure: ESOPHAGOGASTRODUODENOSCOPY (EGD) WITH PROPOFOL;  Surgeon: Manya Silvas, MD;  Location[facility] END[facility];  Service: Endoscopy;  Laterality: N/A;   ESOPHAGOGASTRODUODENOSCOPY ENDOSCOPY     EYE SURGERY     LUMBAR SPINE SURGERY  THORACIC SPINE SURGERY     TONSILLECTOMY       Social History:   reports that he quit smoking about 48 years ago. His smoking use included cigarettes. He has never used smokeless tobacco. He reports current alcohol use of about 1.0 standard drink of alcohol per week. He reports that he does not use drugs.   Family History:  His family history includes Anesthesia problems in his father and mother; Prostate cancer in his brother; Stroke in his father. There is no history of Kidney disease or Bladder Cancer.   Allergies Allergies  Allergen Reactions   Hydrocodone Shortness Of Breath   Oxycodone-Acetaminophen Shortness Of Breath     Home Medications  Prior to Admission medications   Medication Sig Start Date End Date Taking? Authorizing Provider  aspirin EC 81 MG tablet Take 81 mg by mouth every evening.   Yes [provider]  aspirin-acetaminophen-caffeine (EXCEDRIN MIGRAINE) 805 559 0730 MG tablet Take 1-2 tablets by mouth every 6 (six) hours as needed for headache.   Yes [provider]  atorvastatin (LIPITOR) 40 MG tablet Take 40 mg by mouth every evening.   Yes [provider]  calcium carbonate (OSCAL) 1500 (600 Ca) MG TABS tablet Take 600 mg of elemental calcium by mouth daily with lunch.   Yes [provider]  clopidogrel (PLAVIX) 75 MG tablet Take 1 tablet (75 mg total) by mouth daily. 02/01/16  Yes Sudini, Alveta Heimlich, MD  donepezil (ARICEPT) 10 MG tablet Take 10 mg by mouth every evening.   Yes [provider]  DULoxetine (CYMBALTA) 60 MG capsule Take 60 mg by mouth daily with supper.   Yes [provider]   finasteride (PROSCAR) 5 MG tablet TAKE 1 TABLET EVERY DAY 03/27/21  Yes Hollice Espy, MD  gabapentin (NEURONTIN) 100 MG capsule Take 100 mg by mouth 5 (five) times daily. Take 2 capsules ('600mg'$ ) by mouth at midday and take 1 capsule ('300mg'$ ) by mouth every evening   Yes [provider]  glimepiride (AMARYL) 4 MG tablet Take 4 mg by mouth in the morning.   Yes [provider]  Insulin Degludec-Liraglutide (XULTOPHY) 100-3.6 UNIT-MG/ML SOPN Inject 50 Units into the skin daily. (Only use if blood glucose is >70)   Yes [provider]  losartan (COZAAR) 100 MG tablet Take 50 mg by mouth daily.   Yes [provider]  magnesium oxide (MAG-OX) 400 MG tablet Take 400 mg by mouth daily with lunch.   Yes [provider]  metFORMIN (GLUCOPHAGE-XR) 500 MG 24 hr tablet Take 1,000 mg by mouth 2 (two) times daily with a meal.   Yes [provider]  Multiple Vitamin (MULTI-VITAMINS) TABS Take 1 tablet by mouth daily with lunch.   Yes [provider]  oxymetazoline (AFRIN) 0.05 % nasal spray Place 1 spray into both nostrils 2 (two) times daily as needed for congestion.   Yes [provider]  pantoprazole (PROTONIX) 40 MG tablet Take 40 mg by mouth daily before breakfast.   Yes [provider]  pioglitazone (ACTOS) 15 MG tablet Take 45 mg by mouth every evening.   Yes [provider]  sildenafil (REVATIO) 20 MG tablet 1-5 tablets as needed one hour prior to intercourse 04/26/20  Yes Hollice Espy, MD  tamsulosin (FLOMAX) 0.4 MG CAPS capsule Take 1 capsule (0.4 mg total) by mouth daily after supper. 01/30/18  Yes Chrystal, Eulas Post, MD  Tiotropium Bromide Monohydrate 2.5 MCG/ACT AERS Inhale 2 each into the lungs at bedtime.  Yes [provider]  vitamin B-12 (CYANOCOBALAMIN) 1000 MCG tablet Take 1,000 mcg by mouth daily with lunch.   Yes [provider]     Critical care time: 61 minutes       Venetia Night, AGACNP-BC Acute Care Nurse Practitioner [facility]   [phone] / [phone] Please see Amion for pager details.

## 2021-07-23 NOTE — ED Notes (Signed)
Neurologist at bedside. 

## 2021-07-23 NOTE — Progress Notes (Signed)
[place] Progress Note Patient Name: BENNY DEUTSCHMAN DOB: 02/10/1940 MRN: [id number]   Date of Service  07/23/2021  HPI/Events of Note  PCCM admitting. Patient seen by neurology. S/p thrombolysis for stroke symptoms. Stable vital signs and no distress. HR 69, SBP 153. O2 99.   eICU Interventions  Planned for MRI and stroke work up. Call us if needed. Neuro consult noted. PCCM  notes pending.      Intervention Category Major Interventions: Other: Evaluation Type: New Patient Evaluation  Margaretmary Lombard 07/23/2021, 8:15 PM

## 2021-07-23 NOTE — Progress Notes (Deleted)
Patient: Taylor Austin  Service Category: E/M  Provider: Gaspar Cola, MD  DOB: [birth year]  DOS: 07/27/2021  Location: Office  MRN: [id number]  Setting: Ambulatory outpatient  Referring Provider: Juluis Pitch, MD  Type: Established Patient  Specialty: Interventional Pain Management  PCP: Juluis Pitch, MD  Location: Remote location  Delivery: TeleHealth     Virtual Encounter - Pain Management PROVIDER NOTE: Information contained herein reflects review and annotations entered in association with encounter. Interpretation of such information and data should be left to medically-trained personnel. Information provided to patient can be located elsewhere in the medical record under "Patient Instructions". Document created using STT-dictation technology, any transcriptional errors that may result from process are unintentional.    Contact & Pharmacy Preferred: [phone] Home: [phone] (home) Mobile: [phone] (mobile) E-mail: [email or web address], [place] - [place] [address] Phone: [phone] Fax: [phone]  [facility] [place], [place] [address] [place] [address] Phone: [phone] Fax: [phone]  [facility] - [place], [place] [address] Phone: [phone] Fax: [phone]   Pre-screening  Mr. Fawaz offered "in-person" vs "virtual" encounter. He indicated preferring virtual for this encounter.   Reason COVID-19*  Social distancing based on CDC and AMA recommendations.   I contacted Felipe Drone on 07/27/2021 via telephone.      I clearly identified myself as Gaspar Cola, MD. I verified that I was speaking with the correct person using two identifiers (Name: NAIM MURTHA, and date of birth: Jun 05, 1940).  Consent I sought verbal advanced consent  from Felipe Drone for virtual visit interactions. I informed Mr. Ellithorpe of possible security and privacy concerns, risks, and limitations associated with providing "not-in-person" medical evaluation and management services. I also informed Mr. Labarge of the availability of "in-person" appointments. Finally, I informed him that there would be a charge for the virtual visit and that he could be  personally, fully or partially, financially responsible for it. Mr. Ebel expressed understanding and agreed to proceed.   Historic Elements   Mr. WARIS RODGER is a 81 y.o. year old, male patient evaluated today after our last contact on 07/18/2021. Mr. Philipp  has a past medical history of Allergy, Anemia, Arthritis, Back pain, Barrett esophagus, BPH (benign prostatic hyperplasia), Chronic kidney disease, COPD (chronic obstructive pulmonary disease) ([place]), Coronary artery disease, Depression, Diabetes mellitus without complication ([place]), Diverticulitis, Dysphagia, Esophageal reflux, Esophageal reflux, GERD (gastroesophageal reflux disease), Heart murmur, Hyperlipidemia, Hypersomnia, Hypertension, Kidney stones, Low testosterone, OAB (overactive bladder), Obesity, Presence of dental bridge, Rheumatic fever, Sleep apnea, Stroke ([place]) (01/2016), Vertigo, and Vitamin B12 deficiency. He also  has a past surgical history that includes Lumbar spine surgery; Cervical spine surgery; Thoracic spine surgery; Colonoscopy; Esophagogastroduodenoscopy endoscopy; Carotid stent; Back surgery; Tonsillectomy; Coronary angioplasty; Cardiac catheterization (08/11/2014); Cardiac catheterization (N/A, 11/17/2014); Colonoscopy with propofol (N/A, 06/10/2015); Cataract extraction w/PHACO (Left, 04/03/2016); Cataract extraction w/PHACO (Right, 05/01/2016); Eye surgery; and Esophagogastroduodenoscopy (egd) with propofol (N/A, 06/28/2017). Mr. Delcarlo has a current medication list which includes the following prescription(s): accu-chek  aviva plus, albuterol, aspirin ec, atorvastatin, calcium carbonate, clopidogrel, cyanocobalamin, donepezil, duloxetine, finasteride, gabapentin, glimepiride, xultophy, invokana, losartan, losartan, magnesium oxide, metformin, multi-vitamins, oxymetazoline, pantoprazole, pioglitazone, probiotic product, sildenafil, spiriva respimat, tamsulosin, trulicity, and xultophy. He  reports that he quit smoking about 48 years ago. His smoking use included cigarettes.  He has never used smokeless tobacco. He reports current alcohol use of about 1.0 standard drink of alcohol per week. He reports that he does not use drugs. Mr. Marxen is allergic to hydrocodone and oxycodone-acetaminophen.   HPI  Today, he is being contacted for a post-procedure assessment.  Post-procedure evaluation   Type: Lumbar Facet, Medial Branch Block(s) #2  Laterality: Bilateral  Level: L2, L3, L4, L5, & S1 Medial Branch Level(s). Injecting these levels blocks the L3-4, L4-5 and L5-S1 lumbar facet joints. Imaging: Fluoroscopic guidance Anesthesia: Local anesthesia (1-2% Lidocaine) Anxiolysis: IV Versed 2.0 mg Sedation: None. DOS: 07/18/2021 Performed by: Gaspar Cola, MD  Primary Purpose: Diagnostic/Therapeutic Indications: Low back pain severe enough to impact quality of life or function. 1. Lumbar facet syndrome   2. Spondylosis without myelopathy or radiculopathy, lumbosacral region   3. Lumbar facet joint pain (Bilateral)   4. Lumbosacral facet arthropathy (Multilevel) (Bilateral)   5. Retrolisthesis of L2 over L3   6. DDD (degenerative disc disease), lumbosacral   7. Chronic low back pain (1ry area of Pain) (Bilateral) (R>L) w/o sciatica   8. Abnormal MRI, lumbar spine (11/14/2019)    Chronic anticoagulation (Plavix)    NAS-11 Pain score:   Pre-procedure: 5  (patient reports that he is fine when in his recliner but when he gets up that is when the pain is really bad.)/10   Post-procedure: 5  (patient reports  that he is fine when in his recliner but when he gets up that is when the pain is really bad.)/10      Effectiveness:  Initial hour after procedure:   ***. Subsequent 4-6 hours post-procedure:   ***. Analgesia past initial 6 hours:   ***. Ongoing improvement:  Analgesic:  *** Function:    ***    ROM:    ***     Pharmacotherapy Assessment   Opioid Analgesic: None. No chronic opioid analgesics therapy prescribed by our practice. MME/day: 0 mg/day   Monitoring: [place] PMP: PDMP reviewed during this encounter.       Pharmacotherapy: No side-effects or adverse reactions reported. Compliance: No problems identified. Effectiveness: Clinically acceptable. Plan: Refer to "POC". UDS:  Summary  Date Value Ref Range Status  06/15/2020 Note  Final    Comment:    ==================================================================== Compliance Drug Analysis, Ur ==================================================================== Test                             Result       Flag       Units  Drug Present and Declared for Prescription Verification   Gabapentin                     PRESENT      EXPECTED   Duloxetine                     PRESENT      EXPECTED  Drug Present not Declared for Prescription Verification   Acetaminophen                  PRESENT      UNEXPECTED   Ibuprofen                      PRESENT      UNEXPECTED   Diphenhydramine                PRESENT      UNEXPECTED  Lidocaine                      PRESENT      UNEXPECTED  Drug Absent but Declared for Prescription Verification   Salicylate                     Not Detected UNEXPECTED    Aspirin, as indicated in the declared medication list, is not always    detected even when used as directed.  ==================================================================== Test                      Result    Flag   Units      Ref Range   Creatinine              34               mg/dL       >=20 ==================================================================== Declared Medications:  The flagging and interpretation on this report are based on the  following declared medications.  Unexpected results may arise from  inaccuracies in the declared medications.   **Note: The testing scope of this panel includes these medications:   Duloxetine (Cymbalta)  Gabapentin (Neurontin)   **Note: The testing scope of this panel does not include small to  moderate amounts of these reported medications:   Aspirin   **Note: The testing scope of this panel does not include the  following reported medications:   Albuterol (Ventolin HFA)  Atorvastatin (Lipitor)  Calcium  Canagliflozin (Invokana)  Clopidogrel (Plavix)  Cyanocobalamin  Donepezil (Aricept)  Finasteride (Proscar)  Glimepiride (Amaryl)  Glyburide (Glucovance)  Losartan (Cozaar)  Magnesium (Mag-Ox)  Metformin (Glucovance)  Multivitamin  Oxymetazoline (Afrin)  Pantoprazole (Protonix)  Pioglitazone (Actos)  Probiotic  Sildenafil  Tamsulosin (Flomax)  Umeclidinium (Anoro)  Vilanterol (Anoro) ==================================================================== For clinical consultation, please call [phone]. ====================================================================      Laboratory Chemistry Profile   Renal Lab Results  Component Value Date   BUN 17 11/19/2020   CREATININE 0.91 11/19/2020   GFRAA >60 02/02/2016   GFRNONAA >60 11/19/2020    Hepatic Lab Results  Component Value Date   AST 23 11/19/2020   ALT 20 11/19/2020   ALBUMIN 3.8 11/19/2020   ALKPHOS 55 11/19/2020    Electrolytes Lab Results  Component Value Date   NA 135 11/19/2020   K 4.4 11/19/2020   CL 100 11/19/2020   CALCIUM 9.0 11/19/2020   MG 1.6 (L) 06/15/2020    Bone Lab Results  Component Value Date   25OHVITD1 44 06/15/2020   25OHVITD2 <1.0 06/15/2020   25OHVITD3 43 06/15/2020    Inflammation (CRP: Acute  Phase) (ESR: Chronic Phase) Lab Results  Component Value Date   CRP 0.6 06/15/2020   ESRSEDRATE 13 06/15/2020         Note: Above Lab results reviewed.  Imaging  DG PAIN CLINIC C-ARM 1-60 MIN NO REPORT Fluoro was used, but no Radiologist interpretation will be provided.  Please refer to "NOTES" tab for provider progress note.  Assessment  There were no encounter diagnoses.  Plan of Care  Problem-specific:  No problem-specific Assessment & Plan notes found for this encounter.  Mr. ALECXANDER MAINWARING has a current medication list which includes the following long-term medication(s): albuterol, atorvastatin, calcium carbonate, donepezil, duloxetine, gabapentin, glimepiride, losartan, losartan, pantoprazole, pioglitazone, and spiriva respimat.  Pharmacotherapy (Medications Ordered): No orders of the defined types were placed in this encounter.  Orders:  No orders of the defined types were placed in this encounter.  Follow-up plan:   No follow-ups on file.     Interventional Therapies  Risk  Complexity Considerations:   Estimated body mass index is 43.27 kg/m as calculated from the following:   Height as of this encounter: _0  (1.651 m).   Weight as of this encounter: 260 lb (117.9 kg).  PLAVIX Anticoagulation (Stop: 7-10 days  Restart: 2 hours)   Planned  Pending:   Diagnostic/therapeutic bilateral lumbar facet MBB #2    Under consideration:   Diagnostic bilateral lumbar facet block #2    Completed by Girtha Hake, MD: ([facility] PMR) Left L4 TFESI x1 (03/03/2020) (0/0/0/0) (however, currently having no left lower extremity pain)  Left L5 TFESI x2 (01/14/2020; 02/11/2020) (100/100/0/0) (however, currently having no left lower extremity pain)    Completed:   Diagnostic/therapeutic bilateral lumbar facet MBB x1 (12/20/2020) (100/100/100/100)    Therapeutic  Palliative (PRN) options:   None established      Recent Visits Date Type Provider Dept  07/18/21  Procedure visit Milinda Pointer, MD [facility]  06/19/21 Office Visit Milinda Pointer, MD [facility]  Showing recent visits within past 90 days and meeting all other requirements Future Appointments Date Type Provider Dept  07/27/21 Appointment Milinda Pointer, MD [facility]  Showing future appointments within next 90 days and meeting all other requirements  I discussed the assessment and treatment plan with the patient. The patient was provided an opportunity to ask questions and all were answered. The patient agreed with the plan and demonstrated an understanding of the instructions.  Patient advised to call back or seek an in-person evaluation if the symptoms or condition worsens.  Duration of encounter: *** minutes.  Note by: Gaspar Cola, MD Date: 07/27/2021; Time: 2:52 PM

## 2021-07-23 NOTE — ED Provider Notes (Signed)
[facility] Provider Note    Event Date/Time   First MD Initiated Contact with Patient 07/23/21 1541     (approximate)   History   Chief Complaint: stroke   HPI  Taylor Austin is a 81 y.o. male with a past medical history of hyperlipidemia, diabetes, hypertension, CKD, prior stroke, COPD who comes the ED complaining of vision loss and severe headache and dizziness that started at 11:30 AM today.  Constant.  No trauma, no fevers or neck pain.  He takes Plavix but no anticoagulation.  No seizure or syncope.      Physical Exam   Triage Vital Signs: ED Triage Vitals [07/23/21 1526]  Enc Vitals Group     BP 138/84     Pulse Rate 98     Resp 18     Temp 97.8 F (36.6 C)     Temp src      SpO2 98 %     Weight      Height      Head Circumference      Peak Flow      Pain Score      Pain Loc      Pain Edu?      Excl. in [place]?     Most recent vital signs: Vitals:   07/23/21 1526  BP: 138/84  Pulse: 98  Resp: 18  Temp: 97.8 F (36.6 C)  SpO2: 98%    General: Awake, no distress.  CV:  Good peripheral perfusion.  Regular rate rhythm Resp:  Normal effort.  Auscultation bilateral Abd:  No distention.  Other:  Right-sided vision loss.  Motor symmetric, sensation symmetric.  Speech is halting and somewhat limited. NIH stroke scale 3   ED Results / Procedures / Treatments   Labs (all labs ordered are listed, but only abnormal results are displayed) Labs Reviewed  CBC - Abnormal; Notable for the following components:      Result Value   RBC 4.00 (*)    Hemoglobin 11.9 (*)    HCT 36.9 (*)    All other components within normal limits  CBG MONITORING, ED - Abnormal; Notable for the following components:   Glucose-Capillary 145 (*)    All other components within normal limits  DIFFERENTIAL  PROTIME-INR  APTT  COMPREHENSIVE METABOLIC PANEL  ETHANOL  I-STAT CREATININE, ED     EKG Interpreted by me Sinus rhythm, rate of 92. Normal  axis, intervals. PRWP. Normal ST segments and T waves.    RADIOLOGY CT interpreted by me, negative for intracranial hemorrhage or mass.  No obvious infarct.  Radiology report reviewed.  CTA head neck pending   PROCEDURES:  .1-3 Lead EKG Interpretation  Performed by: Carrie Mew, MD Authorized by: Carrie Mew, MD     Interpretation: normal     ECG rate:  90   ECG rate assessment: normal     Rhythm: sinus rhythm     Ectopy: none     Conduction: normal   Comments:        .Critical Care  Performed by: Carrie Mew, MD Authorized by: Carrie Mew, MD   Critical care provider statement:    Critical care time (minutes):  35   Critical care time was exclusive of:  Separately billable procedures and treating other patients   Critical care was necessary to treat or prevent imminent or life-threatening deterioration of the following conditions:  CNS failure or compromise and circulatory failure   Critical care was  time spent personally by me on the following activities:  Development of treatment plan with patient or surrogate, discussions with consultants, evaluation of patient's response to treatment, examination of patient, obtaining history from patient or surrogate, ordering and performing treatments and interventions, ordering and review of laboratory studies, ordering and review of radiographic studies, pulse oximetry, re-evaluation of patient's condition and review of old charts    [facility]: Medications  sodium chloride flush (NS) 0.9 % injection 3 mL (3 mLs Intravenous Given 07/23/21 1550)  tenecteplase (TNKASE) injection for Stroke 25 mg (25 mg Intravenous Given 07/23/21 1556)     IMPRESSION / MDM / [place] / ED COURSE  I reviewed the triage vital signs and the nursing notes.                              Differential diagnosis includes, but is not limited to, acute ischemic stroke, intracranial hemorrhage, brain mass,  complicated migraine, cerebral aneurysm  Patient's presentation is most consistent with acute presentation with potential threat to life or bodily function.  Patient presents with acute neuro symptoms with deficits concerning for stroke.  Seen immediately on arrival by neurology who recommended tenecteplase which is given at 3:56 p.m.  We will plan for ICU admission.       FINAL CLINICAL IMPRESSION(S) / ED DIAGNOSES   Final diagnoses:  None     Rx / DC Orders   ED Discharge Orders     None        Note:  This document was prepared using Dragon voice recognition software and may include unintentional dictation errors.   Carrie Mew, MD 07/23/21 1744

## 2021-07-23 NOTE — Consult Note (Addendum)
NEURO HOSPITALIST CONSULT NOTE   Requestig physician: Dr. Joni Fears  Reason for Consult: Acute onset of blurry vision, dizziness, weakness and severe headache  History obtained from:  Wife and Chart     HPI:                                                                                                                                          Taylor Austin is an 81 y.o. male with a PMHx of late onset dementia (on donepezil), anemia, BPH, CKD, COPD, CAD, DM, depression, HLD, HTN, obesity, rheumatic fever, atherosclerotic carotid disease s/p stenting, sleep apnea (uses BIPAP), prior stroke, vertigo and B12 deficiency who presents to the ED with acute onset of blurry vision, dizziness, weakness and severe headache. LKN was 1130. He was delayed in coming to the ED due to his initial refusal/reluctance to go when his wife initially suggested going. He takes ASA and Plavix as well as atorvastatin at home. He is not on a blood thinner. Modified Rankin score: 0.  Past Medical History:  Diagnosis Date   Allergy    Anemia    Arthritis    Back pain    Barrett esophagus    BPH (benign prostatic hyperplasia)    Chronic kidney disease    COPD (chronic obstructive pulmonary disease) (HCC)    Coronary artery disease    Depression    Diabetes mellitus without complication (HCC)    Diverticulitis    Dysphagia    Esophageal reflux    Esophageal reflux    GERD (gastroesophageal reflux disease)    Heart murmur    Hyperlipidemia    Hypersomnia    Hypertension    Kidney stones    Low testosterone    OAB (overactive bladder)    Obesity    Presence of dental bridge    2 - top   Rheumatic fever    Sleep apnea    BiPAP   Stroke ([place]) 01/2016   Vertigo    Vitamin B12 deficiency     Past Surgical History:  Procedure Laterality Date   BACK SURGERY     CARDIAC CATHETERIZATION  08/11/2014   Procedure: Left Heart Cath and Coronary Angiography;  Surgeon: Teodoro Spray, MD;   Location: [facility] [facility];  Service: Cardiovascular;;   CARDIAC CATHETERIZATION N/A 11/17/2014   Procedure: Right Heart Cath;  Surgeon: Teodoro Spray, MD;  Location: [facility];  Service: Cardiovascular;  Laterality: N/A;   CAROTID STENT     CATARACT EXTRACTION W/PHACO Left 04/03/2016   Procedure: CATARACT EXTRACTION PHACO AND INTRAOCULAR LENS PLACEMENT ([place]) left diabetic;  Surgeon: Eulogio Bear, MD;  Location: [facility];  Service: Ophthalmology;  Laterality: Left;  diabetic - oral meds sleep apnea  CATARACT EXTRACTION W/PHACO Right 05/01/2016   Procedure: CATARACT EXTRACTION PHACO AND INTRAOCULAR LENS PLACEMENT ([place]) Right diabetic;  Surgeon: Eulogio Bear, MD;  Location: [place];  Service: Ophthalmology;  Laterality: Right;  Diabetic oral meds sleep apnea   CERVICAL SPINE SURGERY     COLONOSCOPY     COLONOSCOPY WITH PROPOFOL N/A 06/10/2015   Procedure: COLONOSCOPY WITH PROPOFOL;  Surgeon: Manya Silvas, MD;  Location: [facility] ENDOSCOPY;  Service: Endoscopy;  Laterality: N/A;   CORONARY ANGIOPLASTY     ESOPHAGOGASTRODUODENOSCOPY (EGD) WITH PROPOFOL N/A 06/28/2017   Procedure: ESOPHAGOGASTRODUODENOSCOPY (EGD) WITH PROPOFOL;  Surgeon: Manya Silvas, MD;  Location[facility] END[facility]COPY;  Service: Endoscopy;  Laterality: N/A;   ESOPHAGOGASTRODUODENOSCOPY ENDOSCOPY     EYE SURGERY     LUMBAR SPINE SURGERY     THORACIC SPINE SURGERY     TONSILLECTOMY      Family History  Problem Relation Age of Onset   Stroke Father    Anesthesia problems Father    Anesthesia problems Mother    Prostate cancer Brother    Kidney disease Neg Hx    Bladder Cancer Neg Hx             Social History:  reports that he quit smoking about 48 years ago. His smoking use included cigarettes. He has never used smokeless tobacco. He reports current alcohol use of about 1.0 standard drink of alcohol per week. He reports that he does not use drugs.  Allergies  Allergen  Reactions   Hydrocodone Shortness Of Breath   Oxycodone-Acetaminophen Shortness Of Breath    MEDICATIONS:                                                                                                                     No current facility-administered medications on file prior to encounter.   Current Outpatient Medications on File Prior to Encounter  Medication Sig Dispense Refill   ACCU-CHEK AVIVA PLUS test strip USE STRIP TO CHECK GLUCOSE ONCE DAILY  0   albuterol (PROVENTIL HFA;VENTOLIN HFA) 108 (90 Base) MCG/ACT inhaler Inhale into the lungs every 6 (six) hours as needed for wheezing or shortness of breath.     aspirin EC 81 MG tablet Take 81 mg by mouth daily.     atorvastatin (LIPITOR) 40 MG tablet TAKE 1 TABLET BY MOUTH ONCE DAILY     calcium carbonate (OSCAL) 1500 (600 Ca) MG TABS tablet Take by mouth 2 (two) times daily with a meal.     clopidogrel (PLAVIX) 75 MG tablet Take 1 tablet (75 mg total) by mouth daily. 30 tablet 0   Cyanocobalamin (VITAMIN B-12 PO) Take by mouth.     donepezil (ARICEPT) 10 MG tablet Take by mouth.     DULoxetine (CYMBALTA) 60 MG capsule      finasteride (PROSCAR) 5 MG tablet TAKE 1 TABLET EVERY DAY 90 tablet 3   gabapentin (NEURONTIN) 300 MG capsule 300 mg 3 (three) times daily. 2 in tha am and  1 at night     glimepiride (AMARYL) 4 MG tablet Take by mouth.     Insulin Degludec-Liraglutide (XULTOPHY) 100-3.6 UNIT-MG/ML SOPN Inject 3 mLs into the skin daily. Only take if BS is <70     INVOKANA 100 MG TABS tablet Take 100 mg by mouth daily. (Patient not taking: Reported on 06/19/2021)     losartan (COZAAR) 100 MG tablet Take 100 mg by mouth daily. (Patient not taking: Reported on 06/19/2021)     losartan (COZAAR) 50 MG tablet Take 25 mg by mouth daily.     magnesium oxide (MAG-OX) 400 MG tablet Take 400 mg by mouth 2 (two) times daily.      metFORMIN (GLUCOPHAGE-XR) 500 MG 24 hr tablet Take 1,000 mg by mouth 2 (two) times daily.     Multiple Vitamin  (MULTI-VITAMINS) TABS Take 1 tablet by mouth daily.      oxymetazoline (AFRIN) 0.05 % nasal spray Place 1 spray into both nostrils 2 (two) times daily.     pantoprazole (PROTONIX) 40 MG tablet Take by mouth.     pioglitazone (ACTOS) 15 MG tablet TAKE 3 TABLETS ONE TIME DAILY     Probiotic Product (PROBIOTIC PO) Take by mouth.     sildenafil (REVATIO) 20 MG tablet 1-5 tablets as needed one hour prior to intercourse 30 tablet 3   SPIRIVA RESPIMAT 2.5 MCG/ACT AERS SMARTSIG:2 Spray(s) By Mouth Daily     tamsulosin (FLOMAX) 0.4 MG CAPS capsule Take 1 capsule (0.4 mg total) by mouth daily after supper. 90 capsule 3   TRULICITY 1.5 HA/1.9FX SOPN Inject into the skin. (Patient not taking: Reported on 06/19/2021)     XULTOPHY 100-3.6 UNIT-MG/ML SOPN Inject into the skin.        ROS:                                                                                                                                       Unable to obtain a comprehensive ROS due to dysphasia.    Blood pressure 138/84, pulse 98, temperature 97.8 F (36.6 C), resp. rate 18, SpO2 98 %.   General Examination:                                                                                                       Physical Exam  HEENT -  [place]/AT    Lungs - Respirations unlabored Extremities- Warm and well perfused. No edema.   Neurological Examination Mental Status: Awake and alert.  Speech is sparse and hesitant. Has difficulty understanding several commands, requiring repetition but will eventually perform. Naming intact for all fingers except the middle finger. With naming card he gives phonemic paraphasic errors to 3 of the items (example: "gup" instead of glove). When attempting to describe the kitchen picture, his fluency significantly worsens and he is only able to describe "the chair is tipping". No dysarthria. Anosognosia regarding speech deficit and visual field cut (see below).  Cranial Nerves: II: Right homonymous  hemianopsia. Does not mention any details from the right side of the picture when asked to describe the kitchen scene from the NIHSS card set. PERRL. III,IV, VI: No ptosis. EOMI. No nystagmus.  V: Temp sensation equal bilaterally VII: Smile symmetric VIII: HOH IX,X: No hypophonia or hoarseness XI: Head is close to the midline, can turn to left and right, but tends to be rotated slightly to the left.  XII: Midline tongue  Motor: Right : Upper extremity   5/5    Left:     Upper extremity   5/5  Lower extremity   5/5     Lower extremity   5/5 No pronator drift.  Sensory: Temp and light touch intact throughout bilaterally without asymmetry. No extinction to DSS.  Deep Tendon Reflexes: 1-2+ and symmetric throughout Plantars: Right: downgoing   Left: downgoing Cerebellar: No ataxia with FNF or H-S bilaterally  Gait: Deferred   Lab Results: Basic Metabolic Panel: No results for input(s): "NA", "K", "CL", "CO2", "GLUCOSE", "BUN", "CREATININE", "CALCIUM", "MG", "PHOS" in the last 168 hours.  CBC: No results for input(s): "WBC", "NEUTROABS", "HGB", "HCT", "MCV", "PLT" in the last 168 hours.  Cardiac Enzymes: No results for input(s): "CKTOTAL", "CKMB", "CKMBINDEX", "TROPONINI" in the last 168 hours.  Lipid Panel: No results for input(s): "CHOL", "TRIG", "HDL", "CHOLHDL", "VLDL", "LDLCALC" in the last 168 hours.  Imaging: No results found.  Assessment: 81 year old male presenting with acute onset of blurry vision, dizziness, weakness and severe headache 1. Exam reveals right homonymous hemianopsia and dysphasia. NIHSS 3. Findings best localize to the posterior left MCA territory versus less likely the left PCA territory with possible anatomical variant of PCA supply to the perisylvian cortices. Overall presentation most consistent with acute ischemic stroke.  2. CT head: No acute intracranial abnormality or significant interval change. Stable atrophy and white matter disease. This likely  reflects the sequela of chronic microvascular ischemia. Aspects 10/10 3. After comprehensive review of possible contraindications, he has no absolute contraindications to TNK administration. 4. Patient is an IV thrombolysis candidate. Discussed extensively the risks/benefits of IV thrombolysis treatment vs. no treatment with his wife, including risks of hemorrhage and death with IV thrombolysis administration versus worse overall outcomes on average in patients within thrombolysis time window who are not administered TNK. The patient's aphasia precludes meaningful medical decision making on his part at this time. Overall benefits of TNK regarding long-term prognosis are felt to outweigh risks. The patient's wife expressed understanding and wish to proceed with TNK.  5. Stroke risk factors: Anemia, morbid obesity, CKD, CAD, DM, HLD, HTN, history of rheumatic fever, atherosclerotic carotid disease, sleep apnea and prior stroke.  Recommendations: 1. STAT CTA of head and neck 2. Admit to the ICU for post-TNK care. Post-TNK order set to include frequent neuro checks and BP management.  3. No antiplatelet medications or anticoagulants for at least 24 hours following TNK.  4. DVT prophylaxis with SCDs.  5. Continue atorvastatin.  6. Will need to be restarted on  his home DAPT regimen (ASA and Plavix) if follow up CT at 24 hours is negative for hemorrhagic conversion. 7. TTE.  8. MRI brain.  9. Cardiac telemetry 10. PT/OT/Speech.  11. NPO until passes swallow evaluation.  12. Sliding scale insulin.  13. Fasting lipid panel, HgbA1c  Addendum: STAT CTA:  1. Atherosclerotic calcifications at the carotid bifurcations bilaterally without significant stenosis of greater than 50% relative to the more distal vessels. 2. Dense calcifications at the origins of both vertebral arteries with probable high-grade stenoses. 3. Atherosclerotic calcifications at the dural margin of the left vertebral artery without  significant stenosis. 4. Atherosclerotic calcifications within the cavernous internal carotid arteries bilaterally without significant stenosis relative to the more distal vessel. 5. Mild small vessel irregularity without a significant proximal stenosis, aneurysm, or branch vessel occlusion within the Circle of Willis. 6. Multilevel degenerative changes in the cervical spine with anterior fusion at C5-6 and C6-7 is solid.   Electronically signed: Dr. Kerney Elbe 07/23/2021, 3:32 PM

## 2021-07-24 ENCOUNTER — Inpatient Hospital Stay: Payer: Medicare HMO

## 2021-07-24 ENCOUNTER — Telehealth: Payer: Self-pay | Admitting: Pain Medicine

## 2021-07-24 DIAGNOSIS — H53461 Homonymous bilateral field defects, right side: Secondary | ICD-10-CM | POA: Diagnosis not present

## 2021-07-24 DIAGNOSIS — I639 Cerebral infarction, unspecified: Secondary | ICD-10-CM | POA: Diagnosis not present

## 2021-07-24 LAB — BASIC METABOLIC PANEL
Anion gap: 8 (ref 5–15)
BUN: 17 mg/dL (ref 8–23)
CO2: 28 mmol/L (ref 22–32)
Calcium: 9.3 mg/dL (ref 8.9–10.3)
Chloride: 102 mmol/L (ref 98–111)
Creatinine, Ser: 0.75 mg/dL (ref 0.61–1.24)
GFR, Estimated: >60 mL/min (ref >60)
Glucose, Bld: 102 mg/dL — ABNORMAL HIGH (ref 70–99)
Potassium: 3.9 mmol/L (ref 3.5–5.1)
Sodium: 138 mmol/L (ref 135–145)

## 2021-07-24 LAB — LIPID PANEL
Cholesterol: 148 mg/dL (ref 0–200)
HDL: 53 mg/dL (ref >40)
LDL Cholesterol: 69 mg/dL (ref 0–99)
Total CHOL/HDL Ratio: 2.8 RATIO
Triglycerides: 129 mg/dL (ref <150)
VLDL: 26 mg/dL (ref 0–40)

## 2021-07-24 LAB — GLUCOSE, CAPILLARY
Glucose-Capillary: 103 mg/dL — ABNORMAL HIGH (ref 70–99)
Glucose-Capillary: 101 mg/dL — ABNORMAL HIGH (ref 70–99)
Glucose-Capillary: 195 mg/dL — ABNORMAL HIGH (ref 70–99)
Glucose-Capillary: 115 mg/dL — ABNORMAL HIGH (ref 70–99)
Glucose-Capillary: 183 mg/dL — ABNORMAL HIGH (ref 70–99)

## 2021-07-24 LAB — CBC
HCT: 35.8 % — ABNORMAL LOW (ref 39.0–52.0)
Hemoglobin: 11.7 g/dL — ABNORMAL LOW (ref 13.0–17.0)
MCH: 29.8 pg (ref 26.0–34.0)
MCHC: 32.7 g/dL (ref 30.0–36.0)
MCV: 91.1 fL (ref 80.0–100.0)
Platelets: 278 10*3/uL (ref 150–400)
RBC: 3.93 MIL/uL — ABNORMAL LOW (ref 4.22–5.81)
RDW: 14.8 % (ref 11.5–15.5)
WBC: 9.2 10*3/uL (ref 4.0–10.5)
nRBC: 0 % (ref 0.0–0.2)

## 2021-07-24 LAB — PHOSPHORUS: Phosphorus: 4.1 mg/dL (ref 2.5–4.6)

## 2021-07-24 LAB — HEMOGLOBIN A1C
Hgb A1c MFr Bld: 6.6 % — ABNORMAL HIGH (ref 4.8–5.6)
Mean Plasma Glucose: 142.72 mg/dL

## 2021-07-24 LAB — MAGNESIUM: Magnesium: 2.1 mg/dL (ref 1.7–2.4)

## 2021-07-24 MED ORDER — SUMATRIPTAN SUCCINATE 50 MG PO TABS
25.0000 mg | ORAL_TABLET | Freq: Once | ORAL | Status: AC
  Administered 2021-07-24: 25 mg via ORAL
  Filled 2021-07-24: qty 1

## 2021-07-24 MED ORDER — ATORVASTATIN CALCIUM 20 MG PO TABS
80.0000 mg | ORAL_TABLET | Freq: Every evening | ORAL | Status: DC
Start: 2021-07-24 — End: 2021-07-26
  Administered 2021-07-24 – 2021-07-25 (×2): 80 mg via ORAL
  Filled 2021-07-24 (×2): qty 4

## 2021-07-24 MED ORDER — CLOPIDOGREL BISULFATE 75 MG PO TABS
75.0000 mg | ORAL_TABLET | Freq: Every day | ORAL | Status: DC
  Administered 2021-07-25 – 2021-07-26 (×2): 75 mg via ORAL
  Filled 2021-07-24 (×2): qty 1

## 2021-07-24 MED ORDER — ASPIRIN 81 MG PO CHEW
81.0000 mg | CHEWABLE_TABLET | Freq: Every day | ORAL | Status: DC
  Administered 2021-07-25 – 2021-07-26 (×2): 81 mg via ORAL
  Filled 2021-07-24 (×2): qty 1

## 2021-07-24 MED ORDER — BUTALBITAL-APAP-CAFFEINE 50-325-40 MG PO TABS
1.0000 | ORAL_TABLET | Freq: Four times a day (QID) | ORAL | Status: DC | PRN
  Administered 2021-07-24 (×2): 2 via ORAL
  Filled 2021-07-24 (×2): qty 2

## 2021-07-24 MED ORDER — CHLORHEXIDINE GLUCONATE CLOTH 2 % EX PADS
6.0000 | MEDICATED_PAD | Freq: Every day | CUTANEOUS | Status: DC
  Administered 2021-07-24 – 2021-07-26 (×3): 6 via TOPICAL

## 2021-07-24 NOTE — Progress Notes (Signed)
CHIEF COMPLAINT:   Chief Complaint  Patient presents with   stroke    Subjective  Alert and awake Stroke seems to have resolved     Objective    ROS NEG  Examination:  General exam: Appears calm and comfortable  Respiratory system: Clear to auscultation. Respiratory effort normal. HEENT: [place]/AT, PERRLA, no thrush, no stridor. Cardiovascular system: S1 & S2 heard, RRR. No JVD, murmurs, rubs, gallops or clicks. No pedal edema.  VITALS:  oral temperature is 98 F (36.7 C). His blood pressure is 140/65 and his pulse is 91. His respiration is 20 and oxygen saturation is 98%.   I personally reviewed Labs under Results section.  Radiology Reports CT HEAD WO CONTRAST (5MM)  Result Date: 07/24/2021 CLINICAL DATA:  Mental status change, unknown cause EXAM: CT HEAD WITHOUT CONTRAST TECHNIQUE: Contiguous axial images were obtained from the base of the skull through the vertex without intravenous contrast. RADIATION DOSE REDUCTION: This exam was performed according to the departmental dose-optimization program which includes automated exposure control, adjustment of the mA and/or kV according to patient size and/or use of iterative reconstruction technique. COMPARISON:  Head CT and CTA yesterday. FINDINGS: Brain: No intracranial hemorrhage, mass effect, or midline shift. Stable degree of atrophy. No hydrocephalus. The basilar cisterns are patent. Stable periventricular and deep white matter hypodensity, most prominently affecting the left cerebral hemisphere. No evidence of territorial infarct or acute ischemia. No extra-axial or intracranial fluid collection. Vascular: Skull base atherosclerosis. Detailed vascular assessment on CTA yesterday. Skull: No fracture or focal lesion. Sinuses/Orbits: No acute findings.  Bilateral cataract resection. Other: None. IMPRESSION: 1. No acute intracranial abnormality. 2. Stable atrophy and chronic small vessel ischemia. Electronically Signed   By: Keith Rake M.D.   On: 07/24/2021 01:35   CT ANGIO HEAD NECK W WO CM (CODE STROKE)  Result Date: 07/23/2021 CLINICAL DATA:  Possible stroke.  Visual disturbances. EXAM: CT ANGIOGRAPHY HEAD AND NECK TECHNIQUE: Multidetector CT imaging of the head and neck was performed using the standard protocol during bolus administration of intravenous contrast. Multiplanar CT image reconstructions and MIPs were obtained to evaluate the vascular anatomy. Carotid stenosis measurements (when applicable) are obtained utilizing NASCET criteria, using the distal internal carotid diameter as the denominator. RADIATION DOSE REDUCTION: This exam was performed according to the departmental dose-optimization program which includes automated exposure control, adjustment of the mA and/or kV according to patient size and/or use of iterative reconstruction technique. CONTRAST:  58m OMNIPAQUE IOHEXOL 350 MG/ML SOLN COMPARISON:  CT head without contrast 07/23/2021. Carotid duplex 01/31/2016. MRA head 01/31/2016 FINDINGS: CTA NECK FINDINGS Aortic arch: 3 vessel arch configuration is present. Atherosclerotic changes are present at the origin the subclavian scratched at atherosclerotic calcifications are present at the origin the left subclavian artery and distal arch without significant stenosis or aneurysm. Right carotid system: Right common carotid artery is within normal limits. Calcifications are present at bifurcation into the proximal right ICA without a significant stenosis of greater than 50% relative to the more distal vessel. The cervical right ICA is otherwise normal. Left carotid system: Left common carotid artery is within normal limits. Dense calcifications are present bifurcation without a significant stenosis of greater than 50% relative to the more distal vessel. Vertebral arteries: Left vertebral artery is the dominant vessel. Dense calcifications are present at the origins of both vertebral arteries with stenoses. No tandem  stenoses are present in either vertebral artery in the neck. Skeleton: Multilevel degenerative changes present in the cervical spine.  Anterior fusion at C5-6 and C6-7 is solid. Facet spurring contributes to foraminal narrowing at C3-4 most notably. No focal osseous lesions are present. Other neck: Soft tissues the neck are otherwise unremarkable. Salivary glands are within normal limits. Thyroid is normal. No significant adenopathy is present. No focal mucosal or submucosal lesions are present. Upper chest: Mild dependent atelectasis is present. Lung apices are otherwise clear. Thoracic inlet is within normal limits. Review of the MIP images confirms the above findings CTA HEAD FINDINGS Anterior circulation: Atherosclerotic calcifications are present within the cavernous internal carotid arteries bilaterally without significant stenosis relative to the more distal vessel. The A1 and M1 segments are within normal limits. The anterior communicating artery is patent. MCA bifurcations are normal. Mild small vessel irregularity is present without a significant proximal stenosis or occlusion. Posterior circulation: The left vertebral artery is the dominant vessel. Calcifications are present at the dural margin of the left vertebral artery without significant stenosis. PICA origins are visualized and normal. The vertebrobasilar junction and basilar artery is normal. Both posterior cerebral arteries originate from the basilar tip without significant stenosis. PCA branch vessels are unremarkable. Venous sinuses: The dural sinuses are patent. The straight sinus deep cerebral veins are intact. Cortical veins are within normal limits. No significant vascular malformation is evident. Anatomic variants: None Review of the MIP images confirms the above findings IMPRESSION: 1. Atherosclerotic calcifications at the carotid bifurcations bilaterally without significant stenosis of greater than 50% relative to the more distal vessels.  2. Dense calcifications at the origins of both vertebral arteries with probable high-grade stenoses stenoses. 3. Atherosclerotic calcifications at the dural margin of the left vertebral artery without significant stenosis. 4. Atherosclerotic calcifications within the cavernous internal carotid arteries bilaterally without significant stenosis relative to the more distal vessel. 5. Mild small vessel irregularity without a significant proximal stenosis, aneurysm, or branch vessel occlusion within the Circle of Willis. 6. Multilevel degenerative changes in the cervical spine with anterior fusion at C5-6 and C6-7 is solid. Electronically Signed   By: San Morelle M.D.   On: 07/23/2021 16:56   CT HEAD CODE STROKE WO CONTRAST  Result Date: 07/23/2021 CLINICAL DATA:  Code stroke. Acute onset of visual disturbances. Persistent blurred vision. Severe headache and dizziness. EXAM: CT HEAD WITHOUT CONTRAST TECHNIQUE: Contiguous axial images were obtained from the base of the skull through the vertex without intravenous contrast. RADIATION DOSE REDUCTION: This exam was performed according to the departmental dose-optimization program which includes automated exposure control, adjustment of the mA and/or kV according to patient size and/or use of iterative reconstruction technique. COMPARISON:  CT head without contrast 11/19/2020 FINDINGS: Brain: No acute infarct, hemorrhage, or mass lesion is present. Moderate atrophy and white matter changes are stable. Basal ganglia are intact. Insular ribbon is normal bilaterally. No acute or focal cortical abnormality is present. The ventricles are of normal size. No significant extraaxial fluid collection is present. Insert normal brainstem Vascular: Atherosclerotic calcifications are present within the cavernous internal carotid arteries. No hyperdense vessel is present. Skull: Calvarium is intact. No focal lytic or blastic lesions are present. No significant extracranial soft  tissue lesion is present. Sinuses/Orbits: The paranasal sinuses and mastoid air cells are clear. Bilateral lens replacements are noted. Globes and orbits are otherwise unremarkable. ASPECTS [facility] Stroke Program Early CT Score) - Ganglionic level infarction (caudate, lentiform nuclei, internal capsule, insula, M1-M3 cortex): 7/7 - Supraganglionic infarction (M4-M6 cortex): 3/3 Total score (0-10 with 10 being normal): 10/10 IMPRESSION: 1. No acute intracranial abnormality  or significant interval change. 2. Stable atrophy and white matter disease. This likely reflects the sequela of chronic microvascular ischemia. 3. Aspects 10/10 The above was relayed via text pager to Dr. Cheral Marker on 07/23/2021 at 15:40 . Electronically Signed   By: San Morelle M.D.   On: 07/23/2021 15:40       Assessment/Plan:  ACUTE ISCHEMIC STROKE S/p TNKase Follow up STROKE PROTOCOL  Not on pressors Not on vent  Can transfer to [facility] service     DVT/GI PRX  assessed I Assessed the need for Labs I Assessed the need for Foley I Assessed the need for Central Venous Line Family Discussion when available I Assessed the need for Mobilization I made an Assessment of medications to be adjusted accordingly Safety Risk assessment completed  CASE DISCUSSED IN MULTIDISCIPLINARY ROUNDS WITH ICU TEAM    Vallarie Fei Patricia Pesa, M.D.  [facility] Pulmonary & Critical Care Medicine  Medical Director [place] Director [place] Department

## 2021-07-24 NOTE — Telephone Encounter (Signed)
Patient's wife called in, Mr. Gorniak is in ICU at [facility]. Wife wanted to update Dr. Lowella Dandy.

## 2021-07-24 NOTE — Progress Notes (Signed)
OT Cancellation Note  Patient Details Name: Taylor Austin MRN: [id number] DOB: [birth year]   Cancelled Treatment:    Reason Eval/Treat Not Completed: Active bedrest order. OT orders received and chart reviewed. Pt currently on strict bedrest orders. OT to continue to follow and will re-attempt evaluation when pt is medically able to participate.   Darleen Crocker, MS, OTR/L , CBIS ascom [phone]  07/24/21, 8:57 AM

## 2021-07-24 NOTE — Progress Notes (Signed)
Neurostatus change, not able to tell me the year or where he is, states his head is still hurting in the frontal region, notified NP and received order for CT of head and for a one time extra dose of Immitrex.

## 2021-07-24 NOTE — Progress Notes (Signed)
PT placed on BIPAP for HS. Patient unaware of current home settings. Start settings of 14/6 and 21% FiO2, with PT tolerating well, and SpO2 at 97-98%. RR 16-18.

## 2021-07-24 NOTE — Progress Notes (Signed)
S: Patient has persistent [facility] and word finding difficulty. Unclear how much of this WFD is new but wife states it is worse than usual. No new neurologic complaints today.   MRI brain shows up to 4cm acute infarct L occipital lobe and punctate acute infarct L thalamus c/w L PCA territory infarct (personal review)  O:  Vitals:   07/24/21 1800 07/24/21 1830  BP: (!) 142/119 (!) 147/87  Pulse: 91 84  Resp: (!) 29 (!) 26  Temp:    SpO2: 100% 94%   Gen: awake, alert CV: RRR Resp: normal WOB  Neurologic exam Mental status: oriented to self, wife, and hospital only Speech: no dysarthria, mild WFD CN: PERRL, EOMI, RHH, face symmetric at rest, hearing intact to voice Motor: 5/5 strength throughout Sensory: SILT Coordination: unable to understand instructions to test Reflexes: 1-2+ symm throughout, toes downgoing bilat Gait: deferred  A/P: 81 yo man with hx dementia p/w [facility] and found to have L occipital stroke. His WFD and disorientation appear to be somewhat worse than baseline, potentially 2/2 small amount of thalamic involvement +/- delirium. He is s/p TNK 24 hrs ago with no complications. No e/o hemorrhagic conversion on 24 hr imaging.  - Restart home ASA '81mg'$  daily + plavix '75mg'$  daily - F/u TTE - Permissive HTN x48 hrs from sx onset goal BP <180/105. PRN labetalol or hydralazine if BP above these parameters. Avoid oral antihypertensives. - Check A1c and LDL + add statin per guidelines - q4 hr neuro checks - STAT head CT for any change in neuro exam - Tele - PT/OT/SLP - Stroke education - Amb referral to neurology upon discharge   Will continue to follow  Su Monks, MD [facility] [phone]  If 7pm- 7am, please page neurology on call as listed in [place].

## 2021-07-24 NOTE — Plan of Care (Signed)
?  Problem: Nutrition: ?Goal: Adequate nutrition will be maintained ?Outcome: Progressing ?  ?Problem: Coping: ?Goal: Level of anxiety will decrease ?Outcome: Progressing ?  ?Problem: Elimination: ?Goal: Will not experience complications related to bowel motility ?Outcome: Progressing ?Goal: Will not experience complications related to urinary retention ?Outcome: Progressing ?  ?Problem: Pain Managment: ?Goal: General experience of comfort will improve ?Outcome: Progressing ?  ?Problem: Safety: ?Goal: Ability to remain free from injury will improve ?Outcome: Progressing ?  ?

## 2021-07-24 NOTE — Consult Note (Signed)
PHARMACY CONSULT NOTE - FOLLOW UP  Pharmacy Consult for Electrolyte Monitoring and Replacement   Recent Labs: Potassium (mmol/L)  Date Value  07/24/2021 3.9  01/04/2014 4.2   Magnesium (mg/dL)  Date Value  07/24/2021 2.1   Calcium (mg/dL)  Date Value  07/24/2021 9.3   Calcium, Total (mg/dL)  Date Value  01/04/2014 9.0   Albumin (g/dL)  Date Value  07/23/2021 4.1  01/04/2014 3.9   Phosphorus (mg/dL)  Date Value  07/24/2021 4.1   Sodium (mmol/L)  Date Value  07/24/2021 138  01/04/2014 138     Assessment: 81yo M w/ h/o HLD, DM, HTN, CKD, prior stroke, & COPD presenting to ED complaining of vision loss and severe headache and dizziness with w/u positive for acute ischemic stroke. S/p TNK in ED 7/16 1600 (Hold all APT x24hrs from dose). Admitted to CCU for post-thrombolytic monitoring with neuro. Pharmacy c/s'd for electrolyte mgmt  Goal of Therapy:  Lytes WNL  Plan:  Scr stable, current lytes WNL. Approximately net neutral fluid balance. No repletion at this time Next labs with AM labs  Lorna Dibble ,PharmD Clinical Pharmacist 07/24/2021 8:34 AM

## 2021-07-24 NOTE — Telephone Encounter (Signed)
Dr Naveira notified.  

## 2021-07-25 ENCOUNTER — Inpatient Hospital Stay
Admit: 2021-07-25 | Discharge: 2021-07-25 | Disposition: A | Discharge status: Home / Self Care | Payer: Medicare HMO | Attending: Pulmonary Disease | Admitting: Pulmonary Disease

## 2021-07-25 DIAGNOSIS — F0150 Vascular dementia without behavioral disturbance: Secondary | ICD-10-CM

## 2021-07-25 DIAGNOSIS — G309 Alzheimer's disease, unspecified: Secondary | ICD-10-CM

## 2021-07-25 DIAGNOSIS — F0280 Dementia in other diseases classified elsewhere without behavioral disturbance: Secondary | ICD-10-CM

## 2021-07-25 DIAGNOSIS — Z6841 Body Mass Index (BMI) 40.0 and over, adult: Secondary | ICD-10-CM

## 2021-07-25 DIAGNOSIS — E1140 Type 2 diabetes mellitus with diabetic neuropathy, unspecified: Secondary | ICD-10-CM | POA: Diagnosis not present

## 2021-07-25 DIAGNOSIS — I639 Cerebral infarction, unspecified: Secondary | ICD-10-CM | POA: Diagnosis not present

## 2021-07-25 LAB — BASIC METABOLIC PANEL
Anion gap: 6 (ref 5–15)
BUN: 20 mg/dL (ref 8–23)
CO2: 27 mmol/L (ref 22–32)
Calcium: 9 mg/dL (ref 8.9–10.3)
Chloride: 102 mmol/L (ref 98–111)
Creatinine, Ser: 0.85 mg/dL (ref 0.61–1.24)
GFR, Estimated: >60 mL/min (ref >60)
Glucose, Bld: 138 mg/dL — ABNORMAL HIGH (ref 70–99)
Potassium: 4.2 mmol/L (ref 3.5–5.1)
Sodium: 135 mmol/L (ref 135–145)

## 2021-07-25 LAB — CBC
HCT: 35.3 % — ABNORMAL LOW (ref 39.0–52.0)
Hemoglobin: 11.5 g/dL — ABNORMAL LOW (ref 13.0–17.0)
MCH: 29.2 pg (ref 26.0–34.0)
MCHC: 32.6 g/dL (ref 30.0–36.0)
MCV: 89.6 fL (ref 80.0–100.0)
Platelets: 282 10*3/uL (ref 150–400)
RBC: 3.94 MIL/uL — ABNORMAL LOW (ref 4.22–5.81)
RDW: 15 % (ref 11.5–15.5)
WBC: 6.4 10*3/uL (ref 4.0–10.5)
nRBC: 0 % (ref 0.0–0.2)

## 2021-07-25 LAB — ECHOCARDIOGRAM COMPLETE
AR max vel: 2.18 cm2
AV Area VTI: 2.02 cm2
AV Area mean vel: 2.13 cm2
AV Mean grad: 6 mmHg
AV Peak grad: 10.1 mmHg
Ao pk vel: 1.59 m/s
Area-P 1/2: 2.9 cm2
S' Lateral: 3.65 cm
Weight: 3929.48 oz

## 2021-07-25 LAB — GLUCOSE, CAPILLARY
Glucose-Capillary: 111 mg/dL — ABNORMAL HIGH (ref 70–99)
Glucose-Capillary: 141 mg/dL — ABNORMAL HIGH (ref 70–99)
Glucose-Capillary: 143 mg/dL — ABNORMAL HIGH (ref 70–99)
Glucose-Capillary: 155 mg/dL — ABNORMAL HIGH (ref 70–99)
Glucose-Capillary: 181 mg/dL — ABNORMAL HIGH (ref 70–99)
Glucose-Capillary: 232 mg/dL — ABNORMAL HIGH (ref 70–99)

## 2021-07-25 LAB — PHOSPHORUS: Phosphorus: 4.7 mg/dL — ABNORMAL HIGH (ref 2.5–4.6)

## 2021-07-25 LAB — MAGNESIUM: Magnesium: 1.9 mg/dL (ref 1.7–2.4)

## 2021-07-25 NOTE — Evaluation (Signed)
Occupational Therapy Evaluation Patient Details Name: Taylor Austin MRN: [id number] DOB: 11/28/1940 Today's Date: 07/25/2021   History of Present Illness Patient is a 81 year old male presenting with visual disturbances.  MRI brain shows up to 4cm acute infarct L occipital lobe and punctate acute infarct L thalamus c/w L PCA territory infarct per neurology. At baseline the patient is able to ambulate with a walker, and has generalized BLE weakness due to chronic pain in lumber spine. Recent diagnosis of dementia   Clinical Impression   Pt was seen for PT/OT co-evaluation this date. Prior to hospital admission, pt was using a RW for ambulation and mostly independent with ADLs. Pt lives with spouse in a two level house - pt wife reports there is a 1/2 bath on the main level, but bedroom/bathroom on second level. Pt wife reports increased agitation in evenings at home. Pt A&O x4. Pt presents to acute OT demonstrating impaired ADL performance and functional mobility 2/2 decreased activity tolerance and functional strength/ROM/balance deficits. Pt currently requires CGA for supine>sit and SUPERVISION for sit>supine - with cues for sequencing. CGA + RW for STS.   MOD A for donning/doffing socks seated EOB - pt able to doff socks using feet, but needs assistance donning socks. CGA + RW with vcs for visual scanning for ADL t/f. CGA with vcs to visually scan enviornment to locate items for grooming tasks at the sink. Pt participated in a visual assessment by drawing a clock on a white board - pt placed the numbers 1-12 counter clockwise and unable to recognize errors with cueing. Vision to be assessed further in functional context. Pt would benefit from skilled OT to address noted impairments and functional limitations (see below for any additional details). Upon hospital discharge, recommend HHOT to maximize pt safety and return to PLOF.      Recommendations for follow up therapy are one component of a  multi-disciplinary discharge planning process, led by the attending physician.  Recommendations may be updated based on patient status, additional functional criteria and insurance authorization.   Follow Up Recommendations  Home health OT    Assistance Recommended at Discharge Frequent or constant Supervision/Assistance  Patient can return home with the following A little help with walking and/or transfers;A lot of help with bathing/dressing/bathroom;Assistance with cooking/housework;Direct supervision/assist for medications management;Direct supervision/assist for financial management;Assist for transportation    Functional Status Assessment  Patient has had a recent decline in their functional status and demonstrates the ability to make significant improvements in function in a reasonable and predictable amount of time.  Equipment Recommendations  Other (comment) (defer to next venue of care)    Recommendations for Other Services       Precautions / Restrictions Precautions Precautions: Fall Restrictions Weight Bearing Restrictions: No      Mobility Bed Mobility Overal bed mobility: Needs Assistance Bed Mobility: Supine to Sit, Sit to Supine     Supine to sit: Min guard Sit to supine: Supervision   General bed mobility comments: increased time and effort required. cues for sequencing    Transfers Overall transfer level: Needs assistance Equipment used: Rolling walker (2 wheels) Transfers: Sit to/from Stand Sit to Stand: Min guard                  Balance Overall balance assessment: Needs assistance Sitting-balance support: Feet supported, No upper extremity supported Sitting balance-Leahy Scale: Good     Standing balance support: No upper extremity supported, During functional activity Standing balance-Leahy Scale: Fair  Standing balance comment: with static standing. patient needed external support of rolling walker with ambulation                            ADL either performed or assessed with clinical judgement   ADL Overall ADL's : Needs assistance/impaired                                       General ADL Comments: MOD A for donning/doffing socks seated EOB - pt able to doff socks using feet, but needs assistance donning socks. CGA + RW with vcs to visually scan enviornment to locate items for grooming tasks at the sink.     Vision   Vision Assessment?: Vision impaired- to be further tested in functional context;Yes Visual Fields: Right homonymous hemianopsia     Perception     Praxis      Pertinent Vitals/Pain Pain Assessment Pain Assessment: No/denies pain     Hand Dominance Right   Extremity/Trunk Assessment Upper Extremity Assessment Upper Extremity Assessment: Overall WFL for tasks assessed   Lower Extremity Assessment Lower Extremity Assessment: Defer to PT evaluation RLE Deficits / Details: 5/5 knee extension, ankle dorsiflexion/plantarflexion RLE Sensation: WNL RLE Coordination: WNL LLE Deficits / Details: 5/5 knee extension, ankle dorsiflexion/plantarflexion LLE Sensation: WNL LLE Coordination: WNL       Communication Communication Communication: No difficulties;HOH   Cognition Arousal/Alertness: Awake/alert Behavior During Therapy: WFL for tasks assessed/performed Overall Cognitive Status: Within Functional Limits for tasks assessed                                 General Comments: patient is able to follow single step commands consistently. he is alert and oriented x 4. spouse does report he has increased agitation in the evenings at home (recent diagnosis of dementia). had patient draw a clock and he placed the numbers 1-12 going counter clockwise - unable to recognize errors with cueing. higher level cognitive functional appears impaired.     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      Prior Functioning/Environment Prior Level of Function : Independent/Modified Independent             Mobility Comments: ambulation using RW, chronic back pain ADLs Comments: Pt was mostly independent with ADLs, pt wife assists wtih donning socks.        OT Problem List: Decreased strength;Decreased activity tolerance;Decreased range of motion;Impaired balance (sitting and/or standing);Impaired vision/perception;Decreased safety awareness      OT Treatment/Interventions: Self-care/ADL training;Therapeutic exercise;Energy conservation;DME and/or AE instruction;Therapeutic activities;Patient/family education;Balance training;Visual/perceptual remediation/compensation    OT Goals(Current goals can be found in the care plan section) Acute Rehab OT Goals Patient Stated Goal: to get better OT Goal  Formulation: With patient Time For Goal Achievement: 08/08/21 Potential to Achieve Goals: Good ADL Goals Pt Will Perform Grooming: with modified independence;standing (with no cues for visual scanning) Pt Will Perform Lower Body Dressing: with min guard assist;sit to/from stand Pt Will Transfer to Toilet: with modified independence;ambulating;regular height toilet (with raised height and LRAD) Additional ADL Goal #1: Pt will verbalize 3/3 visual scanning strategies during meaningful occupations  OT Frequency: Min 2X/week    Co-evaluation PT/OT/SLP Co-Evaluation/Treatment: Yes Reason for  Co-Treatment: To address functional/ADL transfers;For patient/therapist safety PT goals addressed during session: Mobility/safety with mobility OT goals addressed during session: ADL's and self-care      AM-PAC OT "6 Clicks" Daily Activity     Outcome Measure Help from another person eating meals?: A Little Help from another person taking care of personal grooming?: A Little Help from another person toileting, which includes using toliet, bedpan, or urinal?: A Little Help from another person bathing (including washing, rinsing, drying)?: A Lot Help from another person to put on and taking off regular upper body clothing?: A Little Help from another person to put on and taking off regular lower body clothing?: A Lot 6 Click Score: 16   End of Session Equipment Utilized During Treatment: Gait belt;Rolling walker (2 wheels)  Activity Tolerance: Patient tolerated treatment well Patient left: in bed;with call bell/phone within reach;with bed alarm set;with family/visitor present  OT Visit Diagnosis: Other symptoms and signs involving the nervous system (R29.898);Muscle weakness (generalized) (M62.81)                Time: 0350-0938 OT Time Calculation (min): 37 min Charges:  OT General Charges $OT Visit: 1 Visit OT Evaluation $OT Eval Moderate Complexity: 1 Mod OT Treatments $Self Care/Home Management : 8-22 mins  [employer], OTDS  [employer] 07/25/2021, 2:10 PM

## 2021-07-25 NOTE — Hospital Course (Signed)
81 year old male with past medical history of dementia I & D hypertension who presented to the emergency room on 7/16 with complaints of blurry vision, left-sided visual deficit, headache and dizziness.  Patient suspected to have acute CVA and met criteria for tPA.  Head CT unremarkable and patient was administered tPA at 3:56 PM on 7/16.  MRI noted 4 cm acute infarct in the left occipital lobe with punctate acute infarct in left thalamus.  As per tPA protocol, patient was kept in the ICU for monitoring.  Neurology consulted.  Stroke work-up in progress.

## 2021-07-25 NOTE — Assessment & Plan Note (Signed)
Breathing comfortably at baseline.

## 2021-07-25 NOTE — Assessment & Plan Note (Signed)
LDL at target.  Increase Lipitor to 80 mg

## 2021-07-25 NOTE — Evaluation (Addendum)
Physical Therapy Evaluation Patient Details Name: Taylor Austin MRN: [id number] DOB: December 28, 1940 Today's Date: 07/25/2021  History of Present Illness  Patient is a 81 year old male presenting with visual disturbances.  MRI brain shows up to 4cm acute infarct L occipital lobe and punctate acute infarct L thalamus c/w L PCA territory infarct per neurology. At baseline the patient is able to ambulate with a walker, and has generalized BLE weakness due to chronic pain in lumber spine. Recent diagnosis of dementia  Clinical Impression  Patient is agreeable to PT. He is alert and oriented x 4. The spouse reports patient has history of irritability at home in the evenings with recent diagnosis of dementia. He ambulates with a rolling walker without physical assistance and has chronic back pain at baseline. Today, the patient reports a right visual field deficit and needs occasional safety cues with functional mobility. Patient ambulated with and without rolling walker with improved step length and balance with use of rolling walker. He required Min guard assistance with functional mobility for safety. Recommend PT follow up to maximize independence and facilitate return to prior level of function. The spouse wants to bring the patient home with home health services which is appropriate. DME is in place at home already.    Recommendations for follow up therapy are one component of a multi-disciplinary discharge planning process, led by the attending physician.  Recommendations may be updated based on patient status, additional functional criteria and insurance authorization.  Follow Up Recommendations Home health PT      Assistance Recommended at Discharge Set up Supervision/Assistance  Patient can return home with the following  A little help with walking and/or transfers;A little help with bathing/dressing/bathroom;Assist for transportation;Help with stairs or ramp for entrance    Equipment  Recommendations None recommended by PT  Recommendations for Other Services       Functional Status Assessment Patient has had a recent decline in their functional status and demonstrates the ability to make significant improvements in function in a reasonable and predictable amount of time.     Precautions / Restrictions Precautions Precautions: Fall Restrictions Weight Bearing Restrictions: No      Mobility  Bed Mobility Overal bed mobility: Needs Assistance Bed Mobility: Supine to Sit, Sit to Supine     Supine to sit: Min guard Sit to supine: Supervision   General bed mobility comments: increased time and effort required. cues for sequencing    Transfers Overall transfer level: Needs assistance Equipment used: None, Rolling walker (2 wheels) Transfers: Sit to/from Stand Sit to Stand: Min guard           General transfer comment: multiple standing bouts performed. Min gaurd for safety    Ambulation/Gait Ambulation/Gait assistance: Min guard Gait Distance (Feet): 90 Feet (90 ft with rolling walker, 12 ft without rolling walker) Assistive device: Rolling walker (2 wheels), None   Gait velocity: decreased     General Gait Details: patient ambulated into hallway with rolling walker with occasional cues for safety. no loss of balance or increased pain reported with mobility. occasional cues for safety given right visual field deficit. patient also ambulated a short distance without device with decreased step length. recommend to use rolling walker for safety with ambulation. heart rate in the 130's with walking and standing activity  Stairs            Wheelchair Mobility    Modified Rankin (Stroke Patients Only)       Balance Overall balance assessment:  Needs assistance Sitting-balance support: Feet supported Sitting balance-Leahy Scale: Good     Standing balance support: No upper extremity supported Standing balance-Leahy Scale: Fair Standing  balance comment: with static standing. patient needed external support of rolling walker with ambulation                             Pertinent Vitals/Pain Pain Assessment Pain Assessment: No/denies pain    Home Living Family/patient expects to be discharged to:: Private residence Living Arrangements: Spouse/significant other Available Help at Discharge: Family Type of Home: House Home Access: Stairs to enter   Technical brewer of Steps: 1   Home Layout: Two level;1/2 bath on main level;Bed/bath upstairs (has a stair lift to the upstairs) Home Equipment: Conservation officer, nature (2 wheels);Shower seat - built in (elevated toilet set with bars) 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    Prior Function Prior Level of Function : Independent/Modified Independent             Mobility Comments: ambulation using RW, chronic back pain       Hand Dominance   Dominant Hand: Right    Extremity/Trunk Assessment   Upper Extremity Assessment Upper Extremity Assessment: Defer to OT evaluation    Lower Extremity Assessment Lower Extremity Assessment: RLE deficits/detail;LLE deficits/detail RLE Deficits / Details: 5/5 knee extension, ankle dorsiflexion/plantarflexion RLE Sensation: WNL RLE Coordination: WNL LLE Deficits / Details: 5/5 knee extension, ankle dorsiflexion/plantarflexion LLE Sensation: WNL LLE Coordination: WNL       Communication   Communication: No difficulties  Cognition Arousal/Alertness: Awake/alert Behavior During Therapy: WFL for tasks assessed/performed Overall Cognitive Status: Within Functional Limits for tasks assessed                                 General Comments: patient is able to follow single step commands consistently. he is alert and oriented x 4. spouse does report he has increased agitation in the evenings at home (recent diagnosis of dementia). had patient draw a clock and he placed the numbers  1-12 going counter clockwise. higher level cognitive functional appears impaired        General Comments      Exercises     Assessment/Plan    PT Assessment Patient needs continued PT services  PT Problem List Decreased strength;Decreased range of motion;Decreased activity tolerance;Decreased balance;Decreased mobility;Decreased cognition;Decreased knowledge of use of DME;Decreased safety awareness;Cardiopulmonary status limiting activity       PT Treatment Interventions DME instruction;Gait training;Stair training;Functional mobility training;Therapeutic activities;Balance training;Therapeutic exercise;Neuromuscular re-education;Cognitive remediation;Patient/family education    PT Goals (Current goals can be found in the Care Plan section)  Acute Rehab PT Goals Patient Stated Goal: to return home PT Goal Formulation: With patient/family Time For Goal Achievement: 08/08/21 Potential to Achieve Goals: Good    Frequency 7X/week     Co-evaluation PT/OT/SLP Co-Evaluation/Treatment: Yes Reason for Co-Treatment: To address functional/ADL transfers;For patient/therapist safety PT goals addressed during session: Mobility/safety with mobility         AM-PAC PT "6 Clicks" Mobility  Outcome Measure Help needed turning from your back to your side while in a flat bed without using bedrails?: None Help needed moving from lying on your back to sitting on the side of a flat bed without using bedrails?: A Little Help needed moving to and from a bed to a chair (including a wheelchair)?: A Little Help needed  standing up from a chair using your arms (e.g., wheelchair or bedside chair)?: A Little Help needed to walk in hospital room?: A Little Help needed climbing 3-5 steps with a railing? : A Little 6 Click Score: 19    End of Session Equipment Utilized During Treatment: Gait belt Activity Tolerance: Patient tolerated treatment well Patient left: in bed;with call bell/phone within  reach;with bed alarm set;with family/visitor present Nurse Communication: Mobility status PT Visit Diagnosis: Unsteadiness on feet (R26.81);Other symptoms and signs involving the nervous system (R29.898)    Time: 5974-7185 PT Time Calculation (min) (ACUTE ONLY): 37 min   Charges:   PT Evaluation $PT Eval Low Complexity: 1 Low PT Treatments $Gait Training: 8-22 mins       Minna Merritts, PT, MPT   Percell Locus 07/25/2021, 1:30 PM

## 2021-07-25 NOTE — Assessment & Plan Note (Addendum)
Status post tPA.  While vascular intervention seems to have helped his speech and weakness, has not helped his visual deficits.  Restarted on aspirin and Plavix.  Allowing for permissive hypertension until 7/19.  LDL at 69, meeting therapy goals.  A1c at 6.6, which is in controlled range.  Seen by PT and OT who recommended home health.  Outpatient neurology follow-up.  Echocardiogram results pending.

## 2021-07-25 NOTE — Assessment & Plan Note (Signed)
Meets criteria for BMI greater than 40.

## 2021-07-25 NOTE — Assessment & Plan Note (Signed)
CBGs elevated in the hospital.  However A1c at 6.6 on Actos and metformin.  Continue Neurontin.

## 2021-07-25 NOTE — Progress Notes (Addendum)
[facility] Progress Note  Patient: Taylor Austin    YQM:[id number]  DOA: 07/23/2021    Date of Service: the patient was seen and examined on 07/25/2021  Brief hospital course: 81 year old male with past medical history of dementia I & D hypertension who presented to the emergency room on 7/16 with complaints of blurry vision, left-sided visual deficit, headache and dizziness.  Patient suspected to have acute CVA and met criteria for tPA.  Head CT unremarkable and patient was administered tPA at 3:56 PM on 7/16.  MRI noted 4 cm acute infarct in the left occipital lobe with punctate acute infarct in left thalamus.  As per tPA protocol, patient was kept in the ICU for monitoring.  Neurology consulted.  Stroke work-up in progress.  Assessment and Plan: Assessment and Plan: Acute ischemic stroke ([place]) Status post tPA.  While vascular intervention seems to have helped his speech and weakness, has not helped his visual deficits.  Restarted on aspirin and Plavix.  Allowing for permissive hypertension until 7/19.  LDL at 69, meeting therapy goals.  A1c at 6.6, which is in controlled range.  Seen by PT and OT who recommended home health.  Outpatient neurology follow-up.  Echocardiogram results pending.  Mixed Alzheimer's and vascular dementia [facility] sundowning present.  Continue Aricept.  No behavioral disturbance right now.  Type 2 diabetes mellitus with diabetic neuropathy, without long-term current use of insulin (HCC) CBGs elevated in the hospital.  However A1c at 6.6 on Actos and metformin.  Continue Neurontin.  Chronic obstructive pulmonary disease (HCC) Breathing comfortably at baseline.  Hyperlipidemia associated with type 2 diabetes mellitus (HCC) LDL at target.  Continue Lipitor.  Morbid obesity with BMI of 40.0-44.9, adult ([place]) Meets criteria for BMI greater than 40.       Body mass index is 40.87 kg/m.        Consultants: Neurology Critical  care  Procedures: tPA administration Echocardiogram results pending  Antimicrobials: None  Code Status: Full code   Subjective: Patient doing okay, complains of intermittent headache (chronic in nature)  Objective: Elevated blood pressures with systolic in the 528U earlier this morning Vitals:   07/25/21 1300 07/25/21 1400  BP: (!) 138/125 103/61  Pulse: 85 76  Resp: 20 14  Temp:    SpO2: 97% 95%    Intake/Output Summary (Last 24 hours) at 07/25/2021 1615 Last data filed at 07/25/2021 1339 Gross per 24 hour  Intake 480 ml  Output 1475 ml  Net -995 ml   Filed Weights   07/25/21 0500  Weight: 111.4 kg   Body mass index is 40.87 kg/m.  Exam:  General: Alert and oriented x2, no acute distress HEENT: Normocephalic, atraumatic, mucous membranes slightly dry Cardiovascular: Regular rate and rhythm, S1-S2, 2 out of 6 systolic ejection murmur Respiratory: Clear to auscultation bilaterally Abdomen: Soft, nontender, nondistended, positive bowel sounds Musculoskeletal: Cyanosis or edema.  Strength appears symmetrical and intact. Skin: No skin breaks, tears or lesions Psychiatry: Underlying dementia, patient is appropriate Neurology: Downgoing toes.  Normal finger-to-nose.  Does have evidence of loss of right-sided peripheral vision.  Data Reviewed: Lipid panel and A1c reviewed  Disposition:  Status is: Inpatient Remains inpatient appropriate because: Ensure stabilization of blood pressures Results of echocardiogram    Anticipated discharge date: 7/19 Family Communication: Wife at the bedside DVT Prophylaxis: SCDs Start: 07/23/21 1918    Author: Annita Brod ,MD 07/25/2021 4:15 PM  To reach On-call, see care teams to locate the attending and reach out  via [email or web address]. Between 7PM-7AM, please contact night-coverage If you still have difficulty reaching the attending provider, please page the [facility] (Director on Call) for [facility] on amion for  assistance.

## 2021-07-25 NOTE — Consult Note (Addendum)
PHARMACY CONSULT NOTE - FOLLOW UP  Pharmacy Consult for Electrolyte Monitoring and Replacement   Recent Labs: Potassium (mmol/L)  Date Value  07/25/2021 4.2  01/04/2014 4.2   Magnesium (mg/dL)  Date Value  07/25/2021 1.9   Calcium (mg/dL)  Date Value  07/25/2021 9.0   Calcium, Total (mg/dL)  Date Value  01/04/2014 9.0   Albumin (g/dL)  Date Value  07/23/2021 4.1  01/04/2014 3.9   Phosphorus (mg/dL)  Date Value  07/25/2021 4.7 (H)   Sodium (mmol/L)  Date Value  07/25/2021 135  01/04/2014 138     Assessment: 81yo M w/ h/o HLD, DM, HTN, CKD, prior stroke, & COPD presenting to ED complaining of vision loss and severe headache and dizziness with w/u positive for acute ischemic stroke. S/p TNK in ED 7/16 1600 (Hold all APT x24hrs from dose). Admitted to CCU for post-thrombolytic monitoring with neuro. Pharmacy c/s'd for electrolyte mgmt  No MIVF, resumed home MgOx PO supplementation  Goal of Therapy:  Lytes WNL  Plan:  Scr stable, current lytes WNL. Negative net 1.6L fluid balance w/ good UOP 0.46m/k/h. No repletion at this time Next labs with AM labs  BLorna Dibble,PharmD Clinical Pharmacist 07/25/2021 8:18 AM

## 2021-07-25 NOTE — Assessment & Plan Note (Signed)
Hospital sundowning present.  Continue Aricept.  No behavioral disturbance right now.

## 2021-07-26 DIAGNOSIS — G309 Alzheimer's disease, unspecified: Secondary | ICD-10-CM | POA: Diagnosis not present

## 2021-07-26 DIAGNOSIS — I5032 Chronic diastolic (congestive) heart failure: Secondary | ICD-10-CM

## 2021-07-26 DIAGNOSIS — I639 Cerebral infarction, unspecified: Secondary | ICD-10-CM | POA: Diagnosis not present

## 2021-07-26 LAB — BASIC METABOLIC PANEL
Anion gap: 5 (ref 5–15)
BUN: 24 mg/dL — ABNORMAL HIGH (ref 8–23)
CO2: 27 mmol/L (ref 22–32)
Calcium: 9 mg/dL (ref 8.9–10.3)
Chloride: 102 mmol/L (ref 98–111)
Creatinine, Ser: 0.96 mg/dL (ref 0.61–1.24)
GFR, Estimated: >60 mL/min (ref >60)
Glucose, Bld: 151 mg/dL — ABNORMAL HIGH (ref 70–99)
Potassium: 4.9 mmol/L (ref 3.5–5.1)
Sodium: 134 mmol/L — ABNORMAL LOW (ref 135–145)

## 2021-07-26 LAB — PHOSPHORUS: Phosphorus: 4.3 mg/dL (ref 2.5–4.6)

## 2021-07-26 LAB — GLUCOSE, CAPILLARY
Glucose-Capillary: 173 mg/dL — ABNORMAL HIGH (ref 70–99)
Glucose-Capillary: 165 mg/dL — ABNORMAL HIGH (ref 70–99)

## 2021-07-26 LAB — MAGNESIUM: Magnesium: 1.9 mg/dL (ref 1.7–2.4)

## 2021-07-26 MED ORDER — ATORVASTATIN CALCIUM 80 MG PO TABS: 40.0000 mg | ORAL_TABLET | Freq: Every evening | ORAL | 2 refills | Status: DC

## 2021-07-26 MED ORDER — INSULIN ASPART 100 UNIT/ML IJ SOLN
0.0000 [IU] | Freq: Three times a day (TID) | INTRAMUSCULAR | Status: DC
  Administered 2021-07-26 (×2): 3 [IU] via SUBCUTANEOUS
  Filled 2021-07-26 (×2): qty 1

## 2021-07-26 NOTE — Progress Notes (Signed)
   07/25/21 2000  Mobility  HOB Elevated/Bed Position Self regulated  Activity Turned to back - supine  Range of Motion/Exercises Active;All extremities  Level of Assistance Standby assist, set-up cues, supervision of patient - no hands on  Assistive Device None  Activity Response Tolerated well

## 2021-07-26 NOTE — Consult Note (Signed)
PHARMACY CONSULT NOTE - FOLLOW UP  Pharmacy Consult for Electrolyte Monitoring and Replacement   Recent Labs: Potassium (mmol/L)  Date Value  07/26/2021 4.9  01/04/2014 4.2   Magnesium (mg/dL)  Date Value  07/26/2021 1.9   Calcium (mg/dL)  Date Value  07/26/2021 9.0   Calcium, Total (mg/dL)  Date Value  01/04/2014 9.0   Albumin (g/dL)  Date Value  07/23/2021 4.1  01/04/2014 3.9   Phosphorus (mg/dL)  Date Value  07/26/2021 4.3   Sodium (mmol/L)  Date Value  07/26/2021 134 (L)  01/04/2014 138     Assessment: 81yo M w/ h/o HLD, DM, HTN, CKD, prior stroke, & COPD presenting to ED complaining of vision loss and severe headache and dizziness with w/u positive for acute ischemic stroke. S/p TNK in ED 7/16 1600 (Hold all APT x24hrs from dose). Admitted to CCU for post-thrombolytic monitoring with neuro. Pharmacy consulted for CCM monitoring (electrolytes and abx dose adjustment)  Goal of Therapy:  K =/> 4, Mg =/> 2, all other electrolytes WNL  Plan:  Current electrolytes WNL, no replacement needed yesterday (7/18) or today. Patient transferred to medical floor. Will d/c CCM consult per protocol and follow up AM labs for potential adjustments.  Raylen Tangonan Rodriguez-Guzman PharmD, BCPS 07/26/2021 7:23 AM

## 2021-07-26 NOTE — Discharge Summary (Addendum)
Physician Discharge Summary   Patient: Taylor Austin MRN: [id number] DOB: 1940/05/14  Admit date:     07/23/2021  Discharge date: 07/26/21  Discharge Physician: Annita Brod   PCP: Juluis Pitch, MD   Recommendations at discharge:   Medication change: Lipitor increased from 40 mg to 80 mg Patient will follow-up with his neurologist, Dr. Manuella Ghazi, in the next 1 month Patient will have discussion with his PCP about Xultophy.  See below.  Discharge Diagnoses: Active Problems:   Acute ischemic stroke (HCC)   Mixed Alzheimer's and vascular dementia ([place])   Type 2 diabetes mellitus with diabetic neuropathy, without long-term current use of insulin (HCC)   Chronic obstructive pulmonary disease (HCC)   Chronic diastolic CHF (congestive heart failure) (HCC)   Hyperlipidemia associated with type 2 diabetes mellitus ([place])   Morbid obesity with BMI of 40.0-44.9, adult ([place])  Resolved Problems:   * No resolved hospital problems. *  Hospital Course: 81 year old male with past medical history of dementia I & D hypertension who presented to the emergency room on 7/16 with complaints of blurry vision, left-sided visual deficit, headache and dizziness.  Patient suspected to have acute CVA and met criteria for tPA.  Head CT unremarkable and patient was administered tPA at 3:56 PM on 7/16.  MRI noted 4 cm acute infarct in the left occipital lobe with punctate acute infarct in left thalamus.  As per tPA protocol, patient was kept in the ICU for monitoring.  Neurology consulted.    By 7/18, able to be transferred out of ICU.  Interestingly, stroke work-up limited.  Already at target LDL on current dose of statin.  A1c noted good control diabetes at 6.6.  Patient states overall blood pressure relatively well controlled.  Already on aspirin and Plavix.  Seen by PT and OT who recommended home health and patient discharged on 7/19.  Assessment and Plan: Acute ischemic stroke ([place]) Status post tPA.   While vascular intervention seems to have helped his speech and weakness, has not helped his visual deficits.  He will continue in the outpatient therapy for balance compensation  Patient already on aspirin and Plavix which was continued.  Limited amount of intervention.  LDL at 69, meeting therapy goals.  That said, Lipitor increased from 40 mg to 80 mg A1c at 6.6, which is in controlled range.  However in discussion with patient, I wife and patient confirmed that he has intermittent episodes of hypoglycemia.  Patient on Xultophy with instructions to take every morning if CBG less than 70.  Patient's wife sometimes will not give this medication if blood sugar is 80 or 90 with concerns that he may have low blood sugars.  She states at times, over the next 24 hours, his blood sugar does drop into as low as 30s or 40s.  Advised patient's wife to give Xultophy only if blood sugar is less than 70.  However then to keep track and note days on which she has hypoglycemia.  She will discuss this with his PCP.  Patient may be also having hyperglycemia episodes which counterbalanced episodes of hypoglycemia and therefore not at range.  In regards to blood pressure, she will get a blood pressure cuff and keep a closer eye on his blood pressures to ensure that he has no blood pressure spikes.  Seen by PT and OT who recommended home health.  Outpatient neurology follow-up.    Mixed Alzheimer's and vascular dementia [facility] sundowning present.  Continue Aricept.  No  behavioral disturbance right now.  Type 2 diabetes mellitus with diabetic neuropathy, without long-term current use of insulin (HCC) CBGs elevated in the hospital.  However A1c at 6.6 on Actos and metformin plus Xultophy.  See above..  Continue Neurontin.  Chronic obstructive pulmonary disease (HCC) Breathing comfortably at baseline.  Chronic diastolic CHF (congestive heart failure) (HCC) Euvolemic.  Incidentally noted on  echocardiogram  Hyperlipidemia associated with type 2 diabetes mellitus ([place]) LDL at target.  Increase Lipitor to 80 mg  Morbid obesity with BMI of 40.0-44.9, adult (HCC) Meets criteria for BMI greater than 40.         Consultants: Neurology Critical care   Procedures: tPA administration Echocardiogram with grade 1 diastolic dysfunction  Disposition: Home with home health Diet recommendation:  Discharge Diet Orders (From admission, onward)     Start     Ordered   07/26/21 0000  Diet - low sodium heart healthy        07/26/21 1440           Carb modified, cardiac diet DISCHARGE MEDICATION: Allergies as of 07/26/2021       Reactions   Hydrocodone Shortness Of Breath   Oxycodone-acetaminophen Shortness Of Breath, Swelling   Confirmed with wife- tongue swelling and SOB after oxycodone        Medication List     TAKE these medications    aspirin EC 81 MG tablet Take 81 mg by mouth every evening.   aspirin-acetaminophen-caffeine 250-250-65 MG tablet Commonly known as: EXCEDRIN MIGRAINE Take 1-2 tablets by mouth every 6 (six) hours as needed for headache.   atorvastatin 80 MG tablet Commonly known as: LIPITOR Take 0.5 tablets (40 mg total) by mouth every evening. What changed: medication strength   calcium carbonate 1500 (600 Ca) MG Tabs tablet Commonly known as: OSCAL Take 600 mg of elemental calcium by mouth daily with lunch.   clopidogrel 75 MG tablet Commonly known as: PLAVIX Take 1 tablet (75 mg total) by mouth daily.   donepezil 10 MG tablet Commonly known as: ARICEPT Take 10 mg by mouth every evening.   DULoxetine 60 MG capsule Commonly known as: CYMBALTA Take 60 mg by mouth daily with supper.   finasteride 5 MG tablet Commonly known as: PROSCAR TAKE 1 TABLET EVERY DAY   gabapentin 100 MG capsule Commonly known as: NEURONTIN Take 100 mg by mouth 5 (five) times daily. Take 2 capsules ($RemoveBef'600mg'ODdZDECNeF$ ) by mouth at midday and take 1 capsule  ($RemoveB'300mg'mvlKgquR$ ) by mouth every evening   glimepiride 4 MG tablet Commonly known as: AMARYL Take 4 mg by mouth in the morning.   losartan 100 MG tablet Commonly known as: COZAAR Take 50 mg by mouth daily.   magnesium oxide 400 MG tablet Commonly known as: MAG-OX Take 400 mg by mouth daily with lunch.   metFORMIN 500 MG 24 hr tablet Commonly known as: GLUCOPHAGE-XR Take 1,000 mg by mouth 2 (two) times daily with a meal.   Multi-Vitamins Tabs Take 1 tablet by mouth daily with lunch.   oxymetazoline 0.05 % nasal spray Commonly known as: AFRIN Place 1 spray into both nostrils 2 (two) times daily as needed for congestion.   pantoprazole 40 MG tablet Commonly known as: PROTONIX Take 40 mg by mouth daily before breakfast.   pioglitazone 15 MG tablet Commonly known as: ACTOS Take 45 mg by mouth every evening.   sildenafil 20 MG tablet Commonly known as: REVATIO 1-5 tablets as needed one hour prior to intercourse   tamsulosin  0.4 MG Caps capsule Commonly known as: FLOMAX Take 1 capsule (0.4 mg total) by mouth daily after supper.   Tiotropium Bromide Monohydrate 2.5 MCG/ACT Aers Inhale 2 each into the lungs at bedtime.   vitamin B-12 1000 MCG tablet Commonly known as: CYANOCOBALAMIN Take 1,000 mcg by mouth daily with lunch.   Xultophy 100-3.6 UNIT-MG/ML Sopn Generic drug: Insulin Degludec-Liraglutide Inject 50 Units into the skin daily. (Only use if blood glucose is >70)        Discharge Exam: Filed Weights   07/25/21 0500 07/26/21 0402  Weight: 111.4 kg 114.7 kg   General: Alert and oriented x3 Cardiovascular: Regular rate and rhythm, S1-S2 Lungs: Clear to auscultation bilaterally  Condition at discharge: good  The results of significant diagnostics from this hospitalization (including imaging, microbiology, ancillary and laboratory) are listed below for reference.   Imaging Studies: ECHOCARDIOGRAM COMPLETE  Result Date: 07/25/2021    ECHOCARDIOGRAM REPORT    Patient Name:   OPAL MCKELLIPS Date of Exam: 07/25/2021 Medical Rec #:  [id number]        Height:       65.0 in Accession #:    [id number]       Weight:       245.6 lb Date of Birth:  [birth year]        BSA:          2.159 m Patient Age:    23 years         BP:           118/65 mmHg Patient Gender: M                HR:           62 bpm. Exam Location:  [facility] Procedure: 2D Echo, Cardiac Doppler and Color Doppler Indications:     I63.9 Stroke  History:         Patient has prior history of Echocardiogram examinations, most                  recent 01/31/2016. CAD, Coronary stent, COPD; Risk                  Factors:Former Smoker, Diabetes, Hypertension, Dyslipidemia and                  Sleep Apnea.  Sonographer:     Rosalia Hammers Referring Phys:  [id number] BRIT[place] L [place] Diagnosing Phys: Yolonda Kida MD  Sonographer Comments: Technically challenging study due to limited acoustic windows. Image acquisition challenging due to patient body habitus and Image acquisition challenging due to respiratory motion. IMPRESSIONS  1. Left ventricular ejection fraction, by estimation, is 65 to 70%. The left ventricle has normal function. The left ventricle has no regional wall motion abnormalities. The left ventricular internal cavity size was moderately dilated. There is moderate  concentric left ventricular hypertrophy. Left ventricular diastolic parameters are consistent with Grade I diastolic dysfunction (impaired relaxation).  2. Right ventricular systolic function is normal. The right ventricular size is normal.  3. Left atrial size was mildly dilated.  4. Right atrial size was mildly dilated.  5. The mitral valve is normal in structure. Trivial mitral valve regurgitation.  6. The aortic valve is grossly normal. Aortic valve regurgitation is not visualized. Conclusion(s)/Recommendation(s): Poor windows for evaluation of left ventricular function by transthoracic echocardiography. Would recommend an alternative means of  evaluation. FINDINGS  Left Ventricle: Left ventricular ejection fraction, by estimation, is 65 to 70%. The left  ventricle has normal function. The left ventricle has no regional wall motion abnormalities. The left ventricular internal cavity size was moderately dilated. There is moderate concentric left ventricular hypertrophy. Left ventricular diastolic parameters are consistent with Grade I diastolic dysfunction (impaired relaxation). Right Ventricle: The right ventricular size is normal. No increase in right ventricular wall thickness. Right ventricular systolic function is normal. Left Atrium: Left atrial size was mildly dilated. Right Atrium: Right atrial size was mildly dilated. Pericardium: There is no evidence of pericardial effusion. Mitral Valve: The mitral valve is normal in structure. Trivial mitral valve regurgitation. Tricuspid Valve: The tricuspid valve is normal in structure. Tricuspid valve regurgitation is mild. Aortic Valve: The aortic valve is grossly normal. Aortic valve regurgitation is not visualized. Aortic valve mean gradient measures 6.0 mmHg. Aortic valve peak gradient measures 10.1 mmHg. Aortic valve area, by VTI measures 2.02 cm. Pulmonic Valve: The pulmonic valve was normal in structure. Pulmonic valve regurgitation is not visualized. Aorta: The ascending aorta was not well visualized. IAS/Shunts: No atrial level shunt detected by color flow Doppler. Additional Comments: There is no pleural effusion.  LEFT VENTRICLE PLAX 2D LVIDd:         5.75 cm   Diastology LVIDs:         3.65 cm   LV e' medial:    4.98 cm/s LV PW:         1.82 cm   LV E/e' medial:  10.8 LV IVS:        1.62 cm   LV e' lateral:   5.87 cm/s LVOT diam:     2.10 cm   LV E/e' lateral: 9.1 LV SV:         67 LV SV Index:   31 LVOT Area:     3.46 cm  RIGHT VENTRICLE RV Basal diam:  3.50 cm RV S prime:     14.50 cm/s TAPSE (M-mode): 1.7 cm LEFT ATRIUM             Index        RIGHT ATRIUM           Index LA diam:        4.00  cm 1.85 cm/m   RA Area:     13.30 cm LA Vol (A2C):   47.2 ml 21.87 ml/m  RA Volume:   28.40 ml  13.16 ml/m LA Vol (A4C):   55.9 ml 25.90 ml/m LA Biplane Vol: 52.4 ml 24.28 ml/m  AORTIC VALVE AV Area (Vmax):    2.18 cm AV Area (Vmean):   2.13 cm AV Area (VTI):     2.02 cm AV Vmax:           159.00 cm/s AV Vmean:          113.000 cm/s AV VTI:            0.330 m AV Peak Grad:      10.1 mmHg AV Mean Grad:      6.0 mmHg LVOT Vmax:         100.00 cm/s LVOT Vmean:        69.400 cm/s LVOT VTI:          0.192 m LVOT/AV VTI ratio: 0.58  AORTA Ao Root diam: 4.20 cm MITRAL VALVE MV Area (PHT): 2.90 cm    SHUNTS MV Decel Time: 262 msec    Systemic VTI:  0.19 m MV E velocity: 53.60 cm/s  Systemic Diam: 2.10 cm MV A velocity: 94.70 cm/s MV  E/A ratio:  0.57 Yolonda Kida MD Electronically signed by Yolonda Kida MD Signature Date/Time: 07/25/2021/4:59:00 PM    Final    MR BRAIN WO CONTRAST  Result Date: 07/24/2021 CLINICAL DATA:  Follow-up stroke presentation.  Lytic treatment. EXAM: MRI HEAD WITHOUT CONTRAST TECHNIQUE: Multiplanar, multiecho pulse sequences of the brain and surrounding structures were obtained without intravenous contrast. COMPARISON:  CT studies yesterday and today. FINDINGS: Brain: Diffusion imaging shows acute infarction in the left PCA territory affecting a 4 cm region of the left occipital lobe in the posterolateral left thalamus. No other vascular territory insult. No evidence of hemorrhage or significant swelling. Elsewhere, the brain and cerebellum are normal. There are chronic small-vessel ischemic changes of the cerebral hemispheric white matter. No mass, hydrocephalus or extra-axial collection. Vascular: Major vessels at the base of the brain show flow. Skull and upper cervical spine: Negative Sinuses/Orbits: Clear/normal Other: None IMPRESSION: Up to 4 cm acute infarction affecting the left occipital lobe. No mass effect or swelling. No sign of hemorrhage. Punctate acute infarction  in the superior left thalamus. Findings consistent with left PCA territory insult. Chronic small-vessel ischemic changes elsewhere affecting the cerebral hemispheric white matter. Electronically Signed   By: Nelson Chimes M.D.   On: 07/24/2021 15:28   CT HEAD WO CONTRAST  Result Date: 07/24/2021 CLINICAL DATA:  Stroke follow-up.  Post TNKase administration. EXAM: CT HEAD WITHOUT CONTRAST TECHNIQUE: Contiguous axial images were obtained from the base of the skull through the vertex without intravenous contrast. RADIATION DOSE REDUCTION: This exam was performed according to the departmental dose-optimization program which includes automated exposure control, adjustment of the mA and/or kV according to patient size and/or use of iterative reconstruction technique. COMPARISON:  07/24/2021; 01/30/2016 FINDINGS: Brain: Similar appearance of potential evolving infarct involving the left occipital lobe (images 18 through 20, series 3), similar to the examination performed earlier same day though new compared to remote examination performed in 2018. No evidence of hemorrhagic conversion following recent lytic infusion. Redemonstrated additional scattered periventricular hypodensities including chronic geographic hypodensity within the posterior aspect of the left centrum semiovale (image 21, series 3). No CT evidence of intracranial or extra-axial mass. Unchanged size and configuration of the ventricles and basilar cisterns. No midline shift. Vascular: Intracranial atherosclerosis. Skull: No displaced calvarial fracture. Sinuses/Orbits: Limited visualization of the paranasal sinuses and mastoid air cells is normal. No air-fluid levels. Post bilateral cataract surgery. Other: Regional soft tissues appear normal. IMPRESSION: 1. Potential evolving infarct involving the left occipital lobe without evidence of hemorrhagic conversion following recent lytic infusion. Further evaluation with MRI could be performed as indicated.  2. Otherwise, similar findings of microvascular ischemic disease. Electronically Signed   By: Sandi Mariscal M.D.   On: 07/24/2021 15:22   CT HEAD WO CONTRAST (5MM)  Result Date: 07/24/2021 CLINICAL DATA:  Mental status change, unknown cause EXAM: CT HEAD WITHOUT CONTRAST TECHNIQUE: Contiguous axial images were obtained from the base of the skull through the vertex without intravenous contrast. RADIATION DOSE REDUCTION: This exam was performed according to the departmental dose-optimization program which includes automated exposure control, adjustment of the mA and/or kV according to patient size and/or use of iterative reconstruction technique. COMPARISON:  Head CT and CTA yesterday. FINDINGS: Brain: No intracranial hemorrhage, mass effect, or midline shift. Stable degree of atrophy. No hydrocephalus. The basilar cisterns are patent. Stable periventricular and deep white matter hypodensity, most prominently affecting the left cerebral hemisphere. No evidence of territorial infarct or acute ischemia. No extra-axial or  intracranial fluid collection. Vascular: Skull base atherosclerosis. Detailed vascular assessment on CTA yesterday. Skull: No fracture or focal lesion. Sinuses/Orbits: No acute findings.  Bilateral cataract resection. Other: None. IMPRESSION: 1. No acute intracranial abnormality. 2. Stable atrophy and chronic small vessel ischemia. Electronically Signed   By: Keith Rake M.D.   On: 07/24/2021 01:35   CT ANGIO HEAD NECK W WO CM (CODE STROKE)  Result Date: 07/23/2021 CLINICAL DATA:  Possible stroke.  Visual disturbances. EXAM: CT ANGIOGRAPHY HEAD AND NECK TECHNIQUE: Multidetector CT imaging of the head and neck was performed using the standard protocol during bolus administration of intravenous contrast. Multiplanar CT image reconstructions and MIPs were obtained to evaluate the vascular anatomy. Carotid stenosis measurements (when applicable) are obtained utilizing NASCET criteria, using the  distal internal carotid diameter as the denominator. RADIATION DOSE REDUCTION: This exam was performed according to the departmental dose-optimization program which includes automated exposure control, adjustment of the mA and/or kV according to patient size and/or use of iterative reconstruction technique. CONTRAST:  30mL OMNIPAQUE IOHEXOL 350 MG/ML SOLN COMPARISON:  CT head without contrast 07/23/2021. Carotid duplex 01/31/2016. MRA head 01/31/2016 FINDINGS: CTA NECK FINDINGS Aortic arch: 3 vessel arch configuration is present. Atherosclerotic changes are present at the origin the subclavian scratched at atherosclerotic calcifications are present at the origin the left subclavian artery and distal arch without significant stenosis or aneurysm. Right carotid system: Right common carotid artery is within normal limits. Calcifications are present at bifurcation into the proximal right ICA without a significant stenosis of greater than 50% relative to the more distal vessel. The cervical right ICA is otherwise normal. Left carotid system: Left common carotid artery is within normal limits. Dense calcifications are present bifurcation without a significant stenosis of greater than 50% relative to the more distal vessel. Vertebral arteries: Left vertebral artery is the dominant vessel. Dense calcifications are present at the origins of both vertebral arteries with stenoses. No tandem stenoses are present in either vertebral artery in the neck. Skeleton: Multilevel degenerative changes present in the cervical spine. Anterior fusion at C5-6 and C6-7 is solid. Facet spurring contributes to foraminal narrowing at C3-4 most notably. No focal osseous lesions are present. Other neck: Soft tissues the neck are otherwise unremarkable. Salivary glands are within normal limits. Thyroid is normal. No significant adenopathy is present. No focal mucosal or submucosal lesions are present. Upper chest: Mild dependent atelectasis is  present. Lung apices are otherwise clear. Thoracic inlet is within normal limits. Review of the MIP images confirms the above findings CTA HEAD FINDINGS Anterior circulation: Atherosclerotic calcifications are present within the cavernous internal carotid arteries bilaterally without significant stenosis relative to the more distal vessel. The A1 and M1 segments are within normal limits. The anterior communicating artery is patent. MCA bifurcations are normal. Mild small vessel irregularity is present without a significant proximal stenosis or occlusion. Posterior circulation: The left vertebral artery is the dominant vessel. Calcifications are present at the dural margin of the left vertebral artery without significant stenosis. PICA origins are visualized and normal. The vertebrobasilar junction and basilar artery is normal. Both posterior cerebral arteries originate from the basilar tip without significant stenosis. PCA branch vessels are unremarkable. Venous sinuses: The dural sinuses are patent. The straight sinus deep cerebral veins are intact. Cortical veins are within normal limits. No significant vascular malformation is evident. Anatomic variants: None Review of the MIP images confirms the above findings IMPRESSION: 1. Atherosclerotic calcifications at the carotid bifurcations bilaterally without significant stenosis of  greater than 50% relative to the more distal vessels. 2. Dense calcifications at the origins of both vertebral arteries with probable high-grade stenoses stenoses. 3. Atherosclerotic calcifications at the dural margin of the left vertebral artery without significant stenosis. 4. Atherosclerotic calcifications within the cavernous internal carotid arteries bilaterally without significant stenosis relative to the more distal vessel. 5. Mild small vessel irregularity without a significant proximal stenosis, aneurysm, or branch vessel occlusion within the Circle of Willis. 6. Multilevel  degenerative changes in the cervical spine with anterior fusion at C5-6 and C6-7 is solid. Electronically Signed   By: San Morelle M.D.   On: 07/23/2021 16:56   CT HEAD CODE STROKE WO CONTRAST  Result Date: 07/23/2021 CLINICAL DATA:  Code stroke. Acute onset of visual disturbances. Persistent blurred vision. Severe headache and dizziness. EXAM: CT HEAD WITHOUT CONTRAST TECHNIQUE: Contiguous axial images were obtained from the base of the skull through the vertex without intravenous contrast. RADIATION DOSE REDUCTION: This exam was performed according to the departmental dose-optimization program which includes automated exposure control, adjustment of the mA and/or kV according to patient size and/or use of iterative reconstruction technique. COMPARISON:  CT head without contrast 11/19/2020 FINDINGS: Brain: No acute infarct, hemorrhage, or mass lesion is present. Moderate atrophy and white matter changes are stable. Basal ganglia are intact. Insular ribbon is normal bilaterally. No acute or focal cortical abnormality is present. The ventricles are of normal size. No significant extraaxial fluid collection is present. Insert normal brainstem Vascular: Atherosclerotic calcifications are present within the cavernous internal carotid arteries. No hyperdense vessel is present. Skull: Calvarium is intact. No focal lytic or blastic lesions are present. No significant extracranial soft tissue lesion is present. Sinuses/Orbits: The paranasal sinuses and mastoid air cells are clear. Bilateral lens replacements are noted. Globes and orbits are otherwise unremarkable. ASPECTS [facility] Stroke Program Early CT Score) - Ganglionic level infarction (caudate, lentiform nuclei, internal capsule, insula, M1-M3 cortex): 7/7 - Supraganglionic infarction (M4-M6 cortex): 3/3 Total score (0-10 with 10 being normal): 10/10 IMPRESSION: 1. No acute intracranial abnormality or significant interval change. 2. Stable atrophy and  white matter disease. This likely reflects the sequela of chronic microvascular ischemia. 3. Aspects 10/10 The above was relayed via text pager to Dr. Cheral Marker on 07/23/2021 at 15:40 . Electronically Signed   By: San Morelle M.D.   On: 07/23/2021 15:40   DG PAIN CLINIC C-ARM 1-60 MIN NO REPORT  Result Date: 07/18/2021 Fluoro was used, but no Radiologist interpretation will be provided. Please refer to "NOTES" tab for provider progress note.   Microbiology: Results for orders placed or performed in visit on 11/22/20  Microscopic Examination     Status: None   Collection Time: 11/22/20  2:38 PM   Urine  Result Value Ref Range Status   WBC, UA 0-5 0 - 5 /hpf Final   RBC, Urine 0-2 0 - 2 /hpf Final   Epithelial Cells (non renal) None seen 0 - 10 /hpf Final   Bacteria, UA None seen None seen/Few Final    Labs: CBC: Recent Labs  Lab 07/23/21 1537 07/24/21 0415 07/25/21 0445  WBC 6.8 9.2 6.4  NEUTROABS 4.7  --   --   HGB 11.9* 11.7* 11.5*  HCT 36.9* 35.8* 35.3*  MCV 92.3 91.1 89.6  PLT 309 278 876   Basic Metabolic Panel: Recent Labs  Lab 07/23/21 1537 07/23/21 2008 07/24/21 0415 07/25/21 0445 07/26/21 0540  NA 137  --  138 135 134*  K 4.6  --  3.9 4.2 4.9  CL 101  --  102 102 102  CO2 26  --  $R'28 27 27  'jp$ GLUCOSE 142*  --  102* 138* 151*  BUN 21  --  17 20 24*  CREATININE 0.87  --  0.75 0.85 0.96  CALCIUM 9.4  --  9.3 9.0 9.0  MG  --  1.5* 2.1 1.9 1.9  PHOS  --  3.8 4.1 4.7* 4.3   Liver Function Tests: Recent Labs  Lab 07/23/21 1537  AST 22  ALT 20  ALKPHOS 40  BILITOT 0.9  PROT 6.8  ALBUMIN 4.1   CBG: Recent Labs  Lab 07/25/21 1134 07/25/21 1516 07/25/21 2035 07/26/21 0911 07/26/21 1219  GLUCAP 232* 181* 143* 173* 165*    Discharge time spent: less than 30 minutes.  Signed: Annita Brod, MD [facility] 07/26/2021

## 2021-07-26 NOTE — Assessment & Plan Note (Signed)
Euvolemic.  Incidentally noted on echocardiogram

## 2021-07-26 NOTE — Progress Notes (Signed)
Occupational Therapy Treatment Patient Details Name: SYMIR MAH MRN: [id number] DOB: 25-Oct-1940 Today's Date: 07/26/2021   History of present illness Patient is a 81 year old male presenting with visual disturbances.  MRI brain shows up to 4cm acute infarct L occipital lobe and punctate acute infarct L thalamus c/w L PCA territory infarct per neurology. At baseline the patient is able to ambulate with a walker, and has generalized BLE weakness due to chronic pain in lumber spine. Recent diagnosis of dementia   OT comments  Pt seen for OT treatment on this date. Upon arrival to room pt awake and alert, seated in recliner chair with family present. Pt reporting no pain at this time. Pt agreeable to tx focused on visual scanning activities and functional mobility. Pt required SUPERVISION with vcs for safe hand placement for STS. CLOSE SUPERVISION for standing card activity - noted more use of L hand over R hand to complete tasks.   CGA + RW with mod vcs for visual scanning and remembering/locating items during navigation tasks in hallway - pt impulsive at times and occasionally running into objects on the R side, secondary to R sided vision loss. Pt participated in visual field and peripheral vision assessments - noted visual deficits in middle/R lower quadrant. Discussed observations with family. Pt and family instructed to place items on the R side to practice visual scanning. Pt and family verbalized understanding of instruction provided. Pt making good progress toward goals. Pt continues to benefit from skilled OT services to maximize return to PLOF and minimize risk of future falls, injury. Will continue to follow POC. Discharge recommendation remains appropriate.     Recommendations for follow up therapy are one component of a multi-disciplinary discharge planning process, led by the attending physician.  Recommendations may be updated based on patient status, additional functional criteria and  insurance authorization.    Follow Up Recommendations  Home health OT    Assistance Recommended at Discharge Frequent or constant Supervision/Assistance  Patient can return home with the following  A little help with walking and/or transfers;A lot of help with bathing/dressing/bathroom;Assistance with cooking/housework;Direct supervision/assist for medications management;Direct supervision/assist for financial management;Assist for transportation   Equipment Recommendations  Other (comment) (defer to next venue of care)    Recommendations for Other Services      Precautions / Restrictions Precautions Precautions: Fall Restrictions Weight Bearing Restrictions: No       Mobility Bed Mobility               General bed mobility comments: pt in recliner at start and end of session    Transfers Overall transfer level: Needs assistance Equipment used: Rolling walker (2 wheels) Transfers: Sit to/from Stand Sit to Stand: Supervision                 Balance Overall balance assessment: Needs assistance Sitting-balance support: No upper extremity supported, Feet supported Sitting balance-Leahy Scale: Good     Standing balance support: During functional activity, No upper extremity supported Standing balance-Leahy Scale: Fair                             ADL either performed or assessed with clinical judgement   ADL Overall ADL's : Needs assistance/impaired  General ADL Comments: CGA + RW with mod vcs for visual scanning and remembering/locating items during navigation tasks in hallway.    Extremity/Trunk Assessment              Vision   Vision Assessment?: Yes Visual Fields: Right homonymous hemianopsia Additional Comments: peripheral vision screen - noted visual deficits in middle/ R lower quadrant   Perception     Praxis      Cognition Arousal/Alertness: Awake/alert Behavior During  Therapy: WFL for tasks assessed/performed Overall Cognitive Status: Within Functional Limits for tasks assessed                                          Exercises      Shoulder Instructions       General Comments      Pertinent Vitals/ Pain       Pain Assessment Pain Assessment: No/denies pain  Home Living                                          Prior Functioning/Environment              Frequency  Min 2X/week        Progress Toward Goals  OT Goals(current goals can now be found in the care plan section)  Progress towards OT goals: Progressing toward goals  Acute Rehab OT Goals Patient Stated Goal: to get better OT Goal Formulation: With patient Time For Goal Achievement: 08/08/21 Potential to Achieve Goals: Good ADL Goals Pt Will Perform Grooming: with modified independence;standing Pt Will Perform Lower Body Dressing: with min guard assist;sit to/from stand Pt Will Transfer to Toilet: with modified independence;ambulating;regular height toilet Additional ADL Goal #1: Pt will verbalize 3/3 visual scanning strategies during meaningful occupations  Plan Discharge plan remains appropriate;Frequency remains appropriate    Co-evaluation                 AM-PAC OT "6 Clicks" Daily Activity     Outcome Measure   Help from another person eating meals?: A Little Help from another person taking care of personal grooming?: A Little Help from another person toileting, which includes using toliet, bedpan, or urinal?: A Little Help from another person bathing (including washing, rinsing, drying)?: A Lot Help from another person to put on and taking off regular upper body clothing?: A Little Help from another person to put on and taking off regular lower body clothing?: A Lot 6 Click Score: 16    End of Session Equipment Utilized During Treatment: Gait belt;Rolling walker (2 wheels)  OT Visit Diagnosis: Other symptoms and  signs involving the nervous system (R29.898);Muscle weakness (generalized) (M62.81)   Activity Tolerance Patient tolerated treatment well   Patient Left in bed;with call bell/phone within reach;with bed alarm set;with family/visitor present   Nurse Communication          Time: 2025-4270 OT Time Calculation (min): 28 min  Charges: OT General Charges $OT Visit: 1 Visit OT Treatments $Self Care/Home Management : 8-22 mins $Therapeutic Activity: 8-22 mins  [employer], OTDS  [place] Mayela Bullard 07/26/2021, 2:39 PM

## 2021-07-26 NOTE — TOC Initial Note (Signed)
Transition of Care [facility]) - Initial/Assessment Note    Patient Details  Name: Taylor Austin MRN: [id number] Date of Birth: [birth year]  [facility] CM/SW Contact:    Alberteen Sam, LCSW Phone Number: 07/26/2021, 11:27 AM  Clinical Narrative:                  Patient potential discharge home today, CSW met with patient and spouse at bedside.   Confirmed PCP is Dr. Lovie Macadamia, reports they got to [facility] on [address] for pharmacy. Per spouse patient has walker, shower seat in a walk in shower, cane, and chair lift at home. Reports also having an over the toilet seat.   Reports being agreeable to [facility] PT, no preference of agency.   Referral given to [facility] with  [facility].   Wife to transport home via private vehicle, no further discharge needs.   Expected Discharge Plan: [place] Barriers to Discharge: No Barriers Identified   Patient Goals and CMS Choice Patient states their goals for this hospitalization and ongoing recovery are:: to go home CMS Medicare.gov Compare Post Acute Care list provided to:: Patient Choice offered to / list presented to : Patient  Expected Discharge Plan and Services Expected Discharge Plan: [place]       Living arrangements for the past 2 months: Single Family Home                           HH Arranged: PT HH Agency: [place] Date [facility] Agency Contacted: 07/26/21 Time HH Agency Contacted: 1127 Representative spoke with at [place]: Tommi Rumps  Prior Living Arrangements/Services Living arrangements for the past 2 months: [place] Lives with:: Spouse   Do you feel safe going back to the place where you live?: Yes               Activities of Daily Living      Permission Sought/Granted                  Emotional Assessment       Orientation: : Oriented to Self, Oriented to Place, Oriented to  Time, Oriented to Situation Alcohol / Substance Use: Not  Applicable Psych Involvement: No (comment)  Admission diagnosis:  Right homonymous hemianopsia [H53.461] Acute ischemic stroke (HCC) [I63.9] Ischemic stroke ([place]) [I63.9] Ischemic cerebrovascular accident (CVA) ([place]) [I63.9] Patient Active Problem List   Diagnosis Date Noted   Ischemic stroke ([place]) 07/23/2021   Acute ischemic stroke ([place]) 07/23/2021   Fasting hyperglycemia 07/18/2021   Orthostatic hypotension 07/18/2021   Lumbar facet syndrome 12/20/2020   Vertigo, Chronic 12/07/2020   Retrolisthesis of L2 over L3 12/07/2020   At high risk for falls 12/07/2020   Aortic atherosclerosis ([place]) 09/20/2020   Hypomagnesemia 07/27/2020   Spondylosis without myelopathy or radiculopathy, lumbosacral region 07/27/2020   Mixed Alzheimer's and vascular dementia ([place]) 06/17/2020   Chronic pain syndrome 06/15/2020   Pharmacologic therapy 06/15/2020   Disorder of skeletal system 06/15/2020   Problems influencing health status 06/15/2020   Abnormal MRI, cervical spine (11/14/2019) 06/15/2020   Abnormal MRI, lumbar spine (11/14/2019) 06/15/2020   History of lumbar laminectomy 06/15/2020   History of fusion of cervical spine 06/15/2020   DDD (degenerative disc disease), cervical 06/15/2020   DDD (degenerative disc disease), lumbosacral 06/15/2020   Lumbosacral facet arthropathy (Multilevel) (Bilateral) 06/15/2020   Cervical facet hypertrophy 06/15/2020   DJD (degenerative joint  disease) of cervical spine 06/15/2020   Chronic anticoagulation (Plav[place]) 06/15/2020   Lumbar facet joint pain (Bilateral) 06/15/2020   History of CVA (cerebrovascular accident) 06/15/2020   Poor historian 06/15/2020   Chronic low back pain (1ry area of Pain) (Bilateral) (R>L) w/o sciatica 12/15/2019   Lumbar stenosis with neurogenic claudication 12/15/2019   Memory loss, short term 10/09/2019   Chronic obstructive pulmonary disease ([place]) 09/18/2018   Morbid obesity with BMI of 40.0-44.9, adult ([place]) 04/03/2018    Hyperlipidemia associated with type 2 diabetes mellitus ([place]) 02/05/2018   Gastroesophageal reflux disease without esophagitis 07/29/2017   Other dysphagia 07/29/2017   Acute cerebrovascular accident (CVA) ([place]) 01/30/2016   Chronic kidney disease 07/14/2014   Clinical depression 07/14/2014   Gait instability 05/05/2014   Arthritis of knee 07/03/2013   Arteriosclerosis of coronary artery 07/03/2013   Type 2 diabetes mellitus with diabetic neuropathy, without long-term current use of insulin ([place]) 07/03/2013   Benign essential HTN 07/03/2013   HLD (hyperlipidemia) 07/03/2013   Breath shortness 07/03/2013   Atherosclerotic heart disease of native coronary artery without angina pectoris 07/03/2013   Sleep apnea 06/18/2013   History of Barrett's esophagus 01/08/2001   PCP:  Juluis Pitch, MD Pharmacy:   [facility] [address] [place] [address] Phone: [phone] Fax: [phone]  [facility] [place], [place] [address] Phone: [phone] Fax: [phone]  [facility] [place] , [place] [address] Phone: [phone] Fax: [phone]     Social Determinants of Health (SDOH) Interventions    Readmission Risk Interventions     No data to display

## 2021-07-26 NOTE — Care Management Important Message (Signed)
Important Message  Patient Details  Name: Taylor Austin MRN: [id number] Date of Birth: 11-Sep-1940   Medicare Important Message Given:  N/A - LOS <3 / Initial given by admissions     [name] 07/26/2021, 9:20 AM

## 2021-07-26 NOTE — Progress Notes (Signed)
Physical Therapy Treatment Patient Details Name: Taylor Austin MRN: [id number] DOB: March 19, 1940 Today's Date: 07/26/2021   History of Present Illness Patient is a 81 year old male presenting with visual disturbances.  MRI brain shows up to 4cm acute infarct L occipital lobe and punctate acute infarct L thalamus c/w L PCA territory infarct per neurology. At baseline the patient is able to ambulate with a walker, and has generalized BLE weakness due to chronic pain in lumber spine. Recent diagnosis of dementia    PT Comments    Pt was long sitting in bed upon arriving. He was awake watching TV and was able to answer all orientation questions appropriately. Per chart review, pt does have dementia with hx of sundowning. Some cognition deficits come to light during session with slight impulsivity and poor safety awareness noted. He was however easily able to exit bed, stand, and ambulate with RW. HR in A-fib with peak into 120s. Endorses no symptoms throughout session. BP was elevated 136/107. Planning to DC home with spouse ( 24/7 supervision?) + HHPT. He will benefit from HHPT at DC to improve safety with all ADLs.    Recommendations for follow up therapy are one component of a multi-disciplinary discharge planning process, led by the attending physician.  Recommendations may be updated based on patient status, additional functional criteria and insurance authorization.  Follow Up Recommendations  Home health PT     Assistance Recommended at Discharge Set up Supervision/Assistance  Patient can return home with the following A little help with walking and/or transfers;A little help with bathing/dressing/bathroom;Assist for transportation;Help with stairs or ramp for entrance   Equipment Recommendations  None recommended by PT       Precautions / Restrictions Precautions Precautions: Fall Restrictions Weight Bearing Restrictions: No     Mobility  Bed Mobility Overal bed mobility: Needs  Assistance Bed Mobility: Supine to Sit  Supine to sit: Supervision   General bed mobility comments: pt was able to roll L to short sit with use of bed rails however no physical assistance required.    Transfers Overall transfer level: Needs assistance Equipment used: Rolling walker (2 wheels) Transfers: Sit to/from Stand Sit to Stand: Supervision      General transfer comment: pt is slightly impulsive but was able to stand/sit without difficulty or assistance.    Ambulation/Gait Ambulation/Gait assistance: Supervision Gait Distance (Feet): 100 Feet Assistive device: Rolling walker (2 wheels)   Gait velocity: WNL, slightly impulsive/fast cadence with VCs to slow down     General Gait Details: pt was able to ambulate 100 ft with RW without LOB. HR was in A-fib with peak into 120s. He is slightly impulsive and author highly recommends he use RW at DC.      Balance Overall balance assessment: Needs assistance Sitting-balance support: Feet supported, No upper extremity supported Sitting balance-Leahy Scale: Good     Standing balance support: Bilateral upper extremity supported, During functional activity, Reliant on assistive device for balance Standing balance-Leahy Scale: Fair Standing balance comment: high fall risk without BUE support on AD         Cognition Arousal/Alertness: Awake/alert Behavior During Therapy: WFL for tasks assessed/performed Overall Cognitive Status: Within Functional Limits for tasks assessed      General Comments: Pt was alert and able to order breakfast I'ly. Does answer all orientation questions correctly however does present with some cognition concerns during session. Pt is extremely cooperative and pleasant. Baseline dementia per chart with history of sundowning.  Pertinent Vitals/Pain Pain Assessment Pain Assessment: No/denies pain     PT Goals (current goals can now be found in the care plan section) Acute Rehab PT  Goals Patient Stated Goal: home Progress towards PT goals: Progressing toward goals    Frequency    7X/week      PT Plan Current plan remains appropriate    Co-evaluation     PT goals addressed during session: Mobility/safety with mobility;Proper use of DME;Balance;Strengthening/ROM        AM-PAC PT "6 Clicks" Mobility   Outcome Measure  Help needed turning from your back to your side while in a flat bed without using bedrails?: None Help needed moving from lying on your back to sitting on the side of a flat bed without using bedrails?: A Little Help needed moving to and from a bed to a chair (including a wheelchair)?: A Little Help needed standing up from a chair using your arms (e.g., wheelchair or bedside chair)?: A Little Help needed to walk in hospital room?: A Little Help needed climbing 3-5 steps with a railing? : A Little 6 Click Score: 19    End of Session   Activity Tolerance: Patient tolerated treatment well Patient left: in chair;with call bell/phone within reach;with chair alarm set;with nursing/sitter in room Nurse Communication: Mobility status PT Visit Diagnosis: Unsteadiness on feet (R26.81);Other symptoms and signs involving the nervous system (E67.544)     Time: 9201-0071 PT Time Calculation (min) (ACUTE ONLY): 25 min  Charges:  $Gait Training: 8-22 mins $Therapeutic Activity: 8-22 mins                     Julaine Fusi PTA 07/26/21, 8:22 AM

## 2021-07-27 ENCOUNTER — Telehealth: Payer: Medicare HMO | Admitting: Pain Medicine

## 2021-08-01 DIAGNOSIS — Z0100 Encounter for examination of eyes and vision without abnormal findings: Secondary | ICD-10-CM | POA: Diagnosis not present

## 2021-08-02 DIAGNOSIS — Z8673 Personal history of transient ischemic attack (TIA), and cerebral infarction without residual deficits: Secondary | ICD-10-CM | POA: Diagnosis not present

## 2021-08-04 DIAGNOSIS — Z6841 Body Mass Index (BMI) 40.0 and over, adult: Secondary | ICD-10-CM | POA: Diagnosis not present

## 2021-08-04 DIAGNOSIS — G4733 Obstructive sleep apnea (adult) (pediatric): Secondary | ICD-10-CM | POA: Diagnosis not present

## 2021-08-04 DIAGNOSIS — J439 Emphysema, unspecified: Secondary | ICD-10-CM | POA: Diagnosis not present

## 2021-08-07 ENCOUNTER — Ambulatory Visit: Payer: Medicare HMO | Attending: Pain Medicine | Admitting: Pain Medicine

## 2021-08-07 DIAGNOSIS — Z7901 Long term (current) use of anticoagulants: Secondary | ICD-10-CM | POA: Diagnosis not present

## 2021-08-07 DIAGNOSIS — M545 Low back pain, unspecified: Secondary | ICD-10-CM

## 2021-08-07 DIAGNOSIS — G8929 Other chronic pain: Secondary | ICD-10-CM

## 2021-08-07 DIAGNOSIS — M47816 Spondylosis without myelopathy or radiculopathy, lumbar region: Secondary | ICD-10-CM

## 2021-08-07 DIAGNOSIS — R937 Abnormal findings on diagnostic imaging of other parts of musculoskeletal system: Secondary | ICD-10-CM

## 2021-08-07 DIAGNOSIS — M5459 Other low back pain: Secondary | ICD-10-CM

## 2021-08-07 DIAGNOSIS — M47817 Spondylosis without myelopathy or radiculopathy, lumbosacral region: Secondary | ICD-10-CM | POA: Diagnosis not present

## 2021-08-07 DIAGNOSIS — M5137 Other intervertebral disc degeneration, lumbosacral region: Secondary | ICD-10-CM

## 2021-08-07 DIAGNOSIS — M4310 Spondylolisthesis, site unspecified: Secondary | ICD-10-CM | POA: Diagnosis not present

## 2021-08-07 NOTE — Progress Notes (Signed)
Patient: Taylor Austin  Service Category: E/M  Provider: Gaspar Cola, MD  DOB: [birth year]  DOS: 08/07/2021  Location: Office  MRN: [id number]  Setting: Ambulatory outpatient  Referring Provider: Juluis Pitch, MD  Type: Established Patient  Specialty: Interventional Pain Management  PCP: Juluis Pitch, MD  Location: Remote location  Delivery: TeleHealth     Virtual Encounter - Pain Management PROVIDER NOTE: Information contained herein reflects review and annotations entered in association with encounter. Interpretation of such information and data should be left to medically-trained personnel. Information provided to patient can be located elsewhere in the medical record under "Patient Instructions". Document created using STT-dictation technology, any transcriptional errors that may result from process are unintentional.    Contact & Pharmacy Preferred: [phone] Home: [phone] (home) Mobile: [phone] (mobile) E-mail: [email or web address], [place] - [place] [address] Phone: [phone] Fax: [phone]  [facility] [place], [place] [address] Phone: [phone] Fax: [phone]  [facility] [place], [place] [address] Phone: [phone] Fax: [phone]   Pre-screening  Taylor Austin offered "in-person" vs "virtual" encounter. He indicated preferring virtual for this encounter.   Reason COVID-19*  Social distancing based on CDC and AMA recommendations.   I contacted Taylor Austin on 08/07/2021 via telephone.      I clearly identified myself as Gaspar Cola, MD. I verified that I was speaking with the correct person using two identifiers (Name: Taylor Austin, and date of birth: 1940-03-01).  Consent I sought verbal advanced consent  from Taylor Austin for virtual visit interactions. I informed Taylor Austin of possible security and privacy concerns, risks, and limitations associated with providing "not-in-person" medical evaluation and management services. I also informed Taylor Austin of the availability of "in-person" appointments. Finally, I informed him that there would be a charge for the virtual visit and that he could be  personally, fully or partially, financially responsible for it. Taylor Austin expressed understanding and agreed to proceed.   Historic Elements   Taylor Austin is a 81 y.o. year old, male patient evaluated today after our last contact on 07/24/2021. Taylor Austin  has a past medical history of Allergy, Anemia, Arthritis, Back pain, Barrett esophagus, BPH (benign prostatic hyperplasia), Chronic kidney disease, COPD (chronic obstructive pulmonary disease) ([employer]), Coronary artery disease, Depression, Diabetes mellitus without complication ([place]), Diverticulitis, Dysphagia, Esophageal reflux, Esophageal reflux, GERD (gastroesophageal reflux disease), Heart murmur, Hyperlipidemia, Hypersomnia, Hypertension, Kidney stones, Low testosterone, OAB (overactive bladder), Obesity, Presence of dental bridge, Rheumatic fever, Sleep apnea, Stroke ([place]) (01/2016), Vertigo, and Vitamin B12 deficiency. He also  has a past surgical history that includes Lumbar spine surgery; Cervical spine surgery; Thoracic spine surgery; Colonoscopy; Esophagogastroduodenoscopy endoscopy; Carotid stent; Back surgery; Tonsillectomy; Coronary angioplasty; Cardiac catheterization (08/11/2014); Cardiac catheterization (N/A, 11/17/2014); Colonoscopy with propofol (N/A, 06/10/2015); Cataract extraction w/PHACO (Left, 04/03/2016); Cataract extraction w/PHACO (Right, 05/01/2016); Eye surgery; and Esophagogastroduodenoscopy (egd) with propofol (N/A, 06/28/2017). Taylor Austin has a current medication list which includes the following prescription(s): aspirin  ec, aspirin-acetaminophen-caffeine, atorvastatin, calcium carbonate, clopidogrel, donepezil, duloxetine, finasteride, gabapentin, glimepiride, xultophy, losartan, magnesium oxide, metformin, multi-vitamins, oxymetazoline, pantoprazole, pioglitazone, sildenafil, tamsulosin, tiotropium bromide monohydrate, and cyanocobalamin. He  reports that he quit smoking about 48 years ago. His smoking use included cigarettes. He has never used smokeless tobacco. He reports  current alcohol use of about 1.0 standard drink of alcohol per week. He reports that he does not use drugs. Taylor Austin is allergic to hydrocodone and oxycodone-acetaminophen.   HPI  Today, he is being contacted for a post-procedure assessment.   Statement of Medical Necessity:  Taylor Austin to experienced debilitating chronic nerve-associated pain from the Lumbar Facet Syndrome (Spondylosis without myelopathy or radiculopathy, lumbar region [M47.816]).  Duration: This pain has persisted for longer than three months.  Non-surgical care: The patient has either failed to respond, or was unable to tolerate, or simply did not get enough benefit from other more conservative therapies including, but not limited to: 1. Over-the-counter oral analgesic medications (i.e.: ibuprofen, naproxen, etc.) 2. Anti-inflammatory medications 3. Muscle relaxants 4. Membrane stabilizers 5. Opioids 6. Physical therapy (PT), chiropractic manipulation, and/or home exercise program (HEP). 7. Modalities (Heat, ice, etc.)  Invasive therapies: Nerve blocks have failed to provide any significant long-term benefit.  Surgical care: Not indicated.  Physical exam: Has been consistent with Lumbosacral Facet Syndrome.  Diagnostic imaging: Lumbar Facet Arthropathy.                Diagnostic interventional therapies: Taylor Austin has attained greater than 50% reduction in pain from at least two (2) diagnostic medial branch blocks conducted in separate  occasions.   For the above listed reason, I believe, as the examining and treating physician, that it is medically necessary to proceed with Non-Pulsed Radiofrequency Ablation for the purpose of attempting to prolong the duration of the benefits seen with the diagnostic injections.  At this point, after having personally evaluated this patient and being the treating attending physician, it is my professional opinion, as a board-certified pain management specialist, that it is medically necessary for this patient to undergo the treatments that I am ordering.  Any attempt at denying these treatments, by a health care insurance entity, represents a case of "practicing medicine without a license", which is currently illegal in the state of [place].  Furthermore, if this company attempts to use a Mudlogger" to deny such treatment without having performed a face-to-face evaluation, represents an act of gross negligence and malpractice.   Post-procedure evaluation   Type: Lumbar Facet, Medial Branch Block(s) #2  Laterality: Bilateral  Level: L2, L3, L4, L5, & S1 Medial Branch Level(s). Injecting these levels blocks the L3-4, L4-5 and L5-S1 lumbar facet joints. Imaging: Fluoroscopic guidance Anesthesia: Local anesthesia (1-2% Lidocaine) Anxiolysis: IV Versed 2.0 mg Sedation: None. DOS: 07/18/2021 Performed by: Gaspar Cola, MD  Primary Purpose: Diagnostic/Therapeutic Indications: Low back pain severe enough to impact quality of life or function. 1. Lumbar facet syndrome   2. Spondylosis without myelopathy or radiculopathy, lumbosacral region   3. Lumbar facet joint pain (Bilateral)   4. Lumbosacral facet arthropathy (Multilevel) (Bilateral)   5. Retrolisthesis of L2 over L3   6. DDD (degenerative disc disease), lumbosacral   7. Chronic low back pain (1ry area of Pain) (Bilateral) (R>L) w/o sciatica   8. Abnormal MRI, lumbar spine (11/14/2019)    Chronic anticoagulation  (Plavix)    NAS-11 Pain score:   Pre-procedure: 5  (patient reports that he is fine when in his recliner but when he gets up that is when the pain is really bad.)/10   Post-procedure: 5  (patient reports that he is fine when in his recliner but when he gets up that is when the pain is really bad.)/10     Effectiveness:  Initial hour after procedure: 100 %.  Subsequent 4-6 hours post-procedure: 100 %. Analgesia past initial 6 hours: 50 % (ongoing). Ongoing improvement:  Analgesic: The patient indicates having an ongoing 50% improvement in his low back pain.  However, he is still having pain and he is interested in proceeding with radiofrequency ablation. Function: Taylor Austin reports improvement in function ROM: Taylor Austin reports improvement in ROM  Pharmacotherapy Assessment   Opioid Analgesic: None. No chronic opioid analgesics therapy prescribed by our practice. MME/day: 0 mg/day   Monitoring: [place] PMP: PDMP reviewed during this encounter.       Pharmacotherapy: No side-effects or adverse reactions reported. Compliance: No problems identified. Effectiveness: Clinically acceptable. Plan: Refer to "POC". UDS:  Summary  Date Value Ref Range Status  06/15/2020 Note  Final    Comment:    ==================================================================== Compliance Drug Analysis, Ur ==================================================================== Test                             Result       Flag       Units  Drug Present and Declared for Prescription Verification   Gabapentin                     PRESENT      EXPECTED   Duloxetine                     PRESENT      EXPECTED  Drug Present not Declared for Prescription Verification   Acetaminophen                  PRESENT      UNEXPECTED   Ibuprofen                      PRESENT      UNEXPECTED   Diphenhydramine                PRESENT      UNEXPECTED   Lidocaine                      PRESENT      UNEXPECTED  Drug Absent  but Declared for Prescription Verification   Salicylate                     Not Detected UNEXPECTED    Aspirin, as indicated in the declared medication list, is not always    detected even when used as directed.  ==================================================================== Test                      Result    Flag   Units      Ref Range   Creatinine              34               mg/dL      >=20 ==================================================================== Declared Medications:  The flagging and interpretation on this report are based on the  following declared medications.  Unexpected results may arise from  inaccuracies in the declared medications.   **Note: The testing scope of this panel includes these medications:   Duloxetine (Cymbalta)  Gabapentin (Neurontin)   **Note: The testing scope of this panel does not include small to  moderate amounts of these reported medications:   Aspirin   **Note: The testing scope of this panel does not include the  following  reported medications:   Albuterol (Ventolin HFA)  Atorvastatin (Lipitor)  Calcium  Canagliflozin (Invokana)  Clopidogrel (Plavix)  Cyanocobalamin  Donepezil (Aricept)  Finasteride (Proscar)  Glimepiride (Amaryl)  Glyburide (Glucovance)  Losartan (Cozaar)  Magnesium (Mag-Ox)  Metformin (Glucovance)  Multivitamin  Oxymetazoline (Afrin)  Pantoprazole (Protonix)  Pioglitazone (Actos)  Probiotic  Sildenafil  Tamsulosin (Flomax)  Umeclidinium (Anoro)  Vilanterol (Anoro) ==================================================================== For clinical consultation, please call [phone]. ====================================================================    No results found for: "CBDTHCR", "D8THCCBX", "D9THCCBX"   Laboratory Chemistry Profile   Renal Lab Results  Component Value Date   BUN 24 (H) 07/26/2021   CREATININE 0.96 07/26/2021   GFRAA >60 02/02/2016   GFRNONAA >60  07/26/2021    Hepatic Lab Results  Component Value Date   AST 22 07/23/2021   ALT 20 07/23/2021   ALBUMIN 4.1 07/23/2021   ALKPHOS 40 07/23/2021    Electrolytes Lab Results  Component Value Date   NA 134 (L) 07/26/2021   K 4.9 07/26/2021   CL 102 07/26/2021   CALCIUM 9.0 07/26/2021   MG 1.9 07/26/2021   PHOS 4.3 07/26/2021    Bone Lab Results  Component Value Date   25OHVITD1 44 06/15/2020   25OHVITD2 <1.0 06/15/2020   25OHVITD3 43 06/15/2020    Inflammation (CRP: Acute Phase) (ESR: Chronic Phase) Lab Results  Component Value Date   CRP 0.6 06/15/2020   ESRSEDRATE 13 06/15/2020         Note: Above Lab results reviewed.  Imaging  ECHOCARDIOGRAM COMPLETE    ECHOCARDIOGRAM REPORT       Patient Name:   Taylor Austin Date of Exam: 07/25/2021 Medical Rec #:  [id number]        Height:       65.0 in Accession #:    [id number]       Weight:       245.6 lb Date of Birth:  [birth year]        BSA:          2.159 m Patient Age:    17 years         BP:           118/65 mmHg Patient Gender: M                HR:           62 bpm. Exam Location:  [facility]  Procedure: 2D Echo, Cardiac Doppler and Color Doppler  Indications:     I63.9 Stroke   History:         Patient has prior history of Echocardiogram examinations, most                  recent 01/31/2016. CAD, Coronary stent, COPD; Risk                  Factors:Former Smoker, Diabetes, Hypertension, Dyslipidemia and                  Sleep Apnea.   Sonographer:     Rosalia Hammers Referring Phys:  [id number] BRITTON L [place] Diagnosing Phys: Yolonda Kida MD    Sonographer Comments: Technically challenging study due to limited acoustic windows. Image acquisition challenging due to patient body habitus and Image acquisition challenging due to respiratory motion. IMPRESSIONS   1. Left ventricular ejection fraction, by estimation, is 65 to 70%. The left ventricle has normal function. The left ventricle has no regional  wall motion abnormalities. The left ventricular internal cavity size  was moderately dilated. There is moderate  concentric left ventricular hypertrophy. Left ventricular diastolic parameters are consistent with Grade I diastolic dysfunction (impaired relaxation).  2. Right ventricular systolic function is normal. The right ventricular size is normal.  3. Left atrial size was mildly dilated.  4. Right atrial size was mildly dilated.  5. The mitral valve is normal in structure. Trivial mitral valve regurgitation.  6. The aortic valve is grossly normal. Aortic valve regurgitation is not visualized.  Conclusion(s)/Recommendation(s): Poor windows for evaluation of left ventricular function by transthoracic echocardiography. Would recommend an alternative means of evaluation.  FINDINGS  Left Ventricle: Left ventricular ejection fraction, by estimation, is 65 to 70%. The left ventricle has normal function. The left ventricle has no regional wall motion abnormalities. The left ventricular internal cavity size was moderately dilated.  There is moderate concentric left ventricular hypertrophy. Left ventricular diastolic parameters are consistent with Grade I diastolic dysfunction (impaired relaxation).  Right Ventricle: The right ventricular size is normal. No increase in right ventricular wall thickness. Right ventricular systolic function is normal.  Left Atrium: Left atrial size was mildly dilated.  Right Atrium: Right atrial size was mildly dilated.  Pericardium: There is no evidence of pericardial effusion.  Mitral Valve: The mitral valve is normal in structure. Trivial mitral valve regurgitation.  Tricuspid Valve: The tricuspid valve is normal in structure. Tricuspid valve regurgitation is mild.  Aortic Valve: The aortic valve is grossly normal. Aortic valve regurgitation is not visualized. Aortic valve mean gradient measures 6.0 mmHg. Aortic valve peak gradient measures 10.1 mmHg. Aortic valve  area, by VTI measures 2.02 cm.  Pulmonic Valve: The pulmonic valve was normal in structure. Pulmonic valve regurgitation is not visualized.  Aorta: The ascending aorta was not well visualized.  IAS/Shunts: No atrial level shunt detected by color flow Doppler.  Additional Comments: There is no pleural effusion.    LEFT VENTRICLE PLAX 2D LVIDd:         5.75 cm   Diastology LVIDs:         3.65 cm   LV e' medial:    4.98 cm/s LV PW:         1.82 cm   LV E/e' medial:  10.8 LV IVS:        1.62 cm   LV e' lateral:   5.87 cm/s LVOT diam:     2.10 cm   LV E/e' lateral: 9.1 LV SV:         67 LV SV Index:   31 LVOT Area:     3.46 cm    RIGHT VENTRICLE RV Basal diam:  3.50 cm RV S prime:     14.50 cm/s TAPSE (M-mode): 1.7 cm  LEFT ATRIUM             Index        RIGHT ATRIUM           Index LA diam:        4.00 cm 1.85 cm/m   RA Area:     13.30 cm LA Vol (A2C):   47.2 ml 21.87 ml/m  RA Volume:   28.40 ml  13.16 ml/m LA Vol (A4C):   55.9 ml 25.90 ml/m LA Biplane Vol: 52.4 ml 24.28 ml/m  AORTIC VALVE AV Area (Vmax):    2.18 cm AV Area (Vmean):   2.13 cm AV Area (VTI):     2.02 cm AV Vmax:           159.00 cm/s  AV Vmean:          113.000 cm/s AV VTI:            0.330 m AV Peak Grad:      10.1 mmHg AV Mean Grad:      6.0 mmHg LVOT Vmax:         100.00 cm/s LVOT Vmean:        69.400 cm/s LVOT VTI:          0.192 m LVOT/AV VTI ratio: 0.58   AORTA Ao Root diam: 4.20 cm  MITRAL VALVE MV Area (PHT): 2.90 cm    SHUNTS MV Decel Time: 262 msec    Systemic VTI:  0.19 m MV E velocity: 53.60 cm/s  Systemic Diam: 2.10 cm MV A velocity: 94.70 cm/s MV E/A ratio:  0.57  Dwayne D Callwood MD Electronically signed by Yolonda Kida MD Signature Date/Time: 07/25/2021/4:59:00 PM      Final    Assessment  The primary encounter diagnosis was Lumbar facet syndrome. Diagnoses of Lumbar facet joint pain (Bilateral), Spondylosis without myelopathy or radiculopathy, lumbosacral  region, Retrolisthesis of L2 over L3, DDD (degenerative disc disease), lumbosacral, Chronic low back pain (1ry area of Pain) (Bilateral) (R>L) w/o sciatica, Abnormal MRI, lumbar spine (11/14/2019), and Chronic anticoagulation (Plavix) were also pertinent to this visit.  Plan of Care  Problem-specific:  No problem-specific Assessment & Plan notes found for this encounter.  Taylor Austin. BUBBA VANBENSCHOTEN has a current medication list which includes the following long-term medication(s): atorvastatin, calcium carbonate, donepezil, duloxetine, gabapentin, glimepiride, losartan, pantoprazole, pioglitazone, and tiotropium bromide monohydrate.  Pharmacotherapy (Medications Ordered): No orders of the defined types were placed in this encounter.  Orders:  Orders Placed This Encounter  Procedures   Radiofrequency,Lumbar    Standing Status:   Future    Standing Expiration Date:   11/07/2021    Scheduling Instructions:     Side(s): Right-sided     Level: L3-4, L4-5, & L5-S1 Facets (L2, L3, L4, L5, & S1 Medial Branch Nerves)     Sedation: With Sedation.     Scheduling Timeframe: As soon as pre-approved    Order Specific Question:   Where will this procedure be performed?    Answer:   [facility]   Blood Thinner Instructions to Nursing    Always make sure patient has clearance from prescribing physician to stop blood thinners for interventional therapies. If the patient requires a Lovenox-bridge therapy, make sure arrangements are made to institute it with the assistance of the PCP.    Scheduling Instructions:     Have Taylor Austin. Senft stop the Plavix (Clopidogrel) x 7-10 days prior to procedure or surgery.   Follow-up plan:   Return for (68min), procedure (ECT): (R) L-FCT RFA #1.     Interventional Therapies  Risk  Complexity Considerations:   Estimated body mass index is 43.27 kg/m as calculated from the following:   Height as of this encounter: $RemoveBeforeD'5\' 5"'[id number]$  (1.651 m).   Weight as of this encounter:  260 lb (117.9 kg).  PLAVIX Anticoagulation (Stop: 7-10 days  Restart: 2 hours)   Planned  Pending:   Therapeutic right lumbar facet RFA #1     Under consideration:   Therapeutic bilateral lumbar facet RFA #1     Completed by Girtha Hake, MD: ([facility]) Left L4 TFESI x1 (03/03/2020) (0/0/0/0) (however, currently having no left lower extremity pain)  Left L5 TFESI x2 (01/14/2020; 02/11/2020) (100/100/0/0) (however, currently having no left lower  extremity pain)    Completed:   Diagnostic/therapeutic bilateral lumbar facet MBB x2 (07/18/2021) (2nd:100/100/50/50) (1st:100/100/100/100)    Therapeutic  Palliative (PRN) options:   None established    Recent Visits Date Type Provider Dept  07/18/21 Procedure visit Milinda Pointer, MD [facility]  06/19/21 Office Visit Milinda Pointer, MD [facility]  Showing recent visits within past 90 days and meeting all other requirements Today's Visits Date Type Provider Dept  08/07/21 Office Visit Milinda Pointer, MD [facility]  Showing today's visits and meeting all other requirements Future Appointments No visits were found meeting these conditions. Showing future appointments within next 90 days and meeting all other requirements  I discussed the assessment and treatment plan with the patient. The patient was provided an opportunity to ask questions and all were answered. The patient agreed with the plan and demonstrated an understanding of the instructions.  Patient advised to call back or seek an in-person evaluation if the symptoms or condition worsens.  Duration of encounter: 13 minutes.  Note by: Gaspar Cola, MD Date: 08/07/2021; Time: 12:36 PM

## 2021-08-07 NOTE — Patient Instructions (Signed)
______________________________________________________________________  Preparing for Procedure with Sedation  NOTICE: Due to recent regulatory changes, starting on August 08, 2020, procedures requiring intravenous (IV) sedation will no longer be performed at the [facility].  These types of procedures are required to be performed at [facility] ambulatory surgery facility.  We are very sorry for the inconvenience.  Procedure appointments are limited to planned procedures: No Prescription Refills. No disability issues will be discussed. No medication changes will be discussed.  Instructions: Oral Intake: Do not eat or drink anything for at least 8 hours prior to your procedure. (Exception: Blood Pressure Medication. See below.) Transportation: A driver is required. You may not drive yourself after the procedure. Blood Pressure Medicine: Do not forget to take your blood pressure medicine with a sip of water the morning of the procedure. If your Diastolic (lower reading) is above 100 mmHg, elective cases will be cancelled/rescheduled. Blood thinners: These will need to be stopped for procedures. Notify our staff if you are taking any blood thinners. Depending on which one you take, there will be specific instructions on how and when to stop it. Diabetics on insulin: Notify the staff so that you can be scheduled 1st case in the morning. If your diabetes requires high dose insulin, take only  of your normal insulin dose the morning of the procedure and notify the staff that you have done so. Preventing infections: Shower with an antibacterial soap the morning of your procedure. Build-up your immune system: Take 1000 mg of Vitamin C with every meal (3 times a day) the day prior to your procedure. Antibiotics: Inform the staff if you have a condition or reason that requires you to take antibiotics before dental procedures. Pregnancy: If you are pregnant, call and cancel the procedure. Sickness: If  you have a cold, fever, or any active infections, call and cancel the procedure. Arrival: You must be in the facility at least 30 minutes prior to your scheduled procedure. Children: Do not bring children with you. Dress appropriately: There is always the possibility that your clothing may get soiled. Valuables: Do not bring any jewelry or valuables.  Reasons to call and reschedule or cancel your procedure: (Following these recommendations will minimize the risk of a serious complication.) Surgeries: Avoid having procedures within 2 weeks of any surgery. (Avoid for 2 weeks before or after any surgery). Flu Shots: Avoid having procedures within 2 weeks of a flu shots. (Avoid for 2 weeks before or after immunizations). Barium: Avoid having a procedure within 7-10 days after having had a radiological study involving the use of radiological contrast. (Myelograms, Barium swallow or enema study). Heart attacks: Avoid any elective procedures or surgeries for the initial 6 months after a "Myocardial Infarction" (Heart Attack). Blood thinners: It is imperative that you stop these medications before procedures. Let us know if you if you take any blood thinner.  Infection: Avoid procedures during or within two weeks of an infection (including chest colds or gastrointestinal problems). Symptoms associated with infections include: Localized redness, fever, chills, night sweats or profuse sweating, burning sensation when voiding, cough, congestion, stuffiness, runny nose, sore throat, diarrhea, nausea, vomiting, cold or Flu symptoms, recent or current infections. It is specially important if the infection is over the area that we intend to treat. Heart and lung problems: Symptoms that may suggest an active cardiopulmonary problem include: cough, chest pain, breathing difficulties or shortness of breath, dizziness, ankle swelling, uncontrolled high or unusually low blood pressure, and/or palpitations. If you are    experiencing any of these symptoms, cancel your procedure and contact your primary care physician for an evaluation.  Remember:  Regular Business hours are:  Monday to Thursday 8:00 AM to 4:00 PM  Provider's Schedule: Chena Chohan, MD:  Procedure days: Tuesday and Thursday 7:30 AM to 4:00 PM  Bilal Lateef, MD:  Procedure days: Monday and Wednesday 7:30 AM to 4:00 PM ______________________________________________________________________   

## 2021-08-08 DIAGNOSIS — F0152 Vascular dementia, unspecified severity, with psychotic disturbance: Secondary | ICD-10-CM | POA: Diagnosis not present

## 2021-08-08 DIAGNOSIS — R42 Dizziness and giddiness: Secondary | ICD-10-CM | POA: Diagnosis not present

## 2021-08-08 DIAGNOSIS — G8929 Other chronic pain: Secondary | ICD-10-CM | POA: Diagnosis not present

## 2021-08-08 DIAGNOSIS — I69398 Other sequelae of cerebral infarction: Secondary | ICD-10-CM | POA: Diagnosis not present

## 2021-08-08 DIAGNOSIS — M25562 Pain in left knee: Secondary | ICD-10-CM | POA: Diagnosis not present

## 2021-08-08 DIAGNOSIS — R519 Headache, unspecified: Secondary | ICD-10-CM | POA: Diagnosis not present

## 2021-08-08 DIAGNOSIS — M25462 Effusion, left knee: Secondary | ICD-10-CM | POA: Diagnosis not present

## 2021-08-08 DIAGNOSIS — G301 Alzheimer's disease with late onset: Secondary | ICD-10-CM | POA: Diagnosis not present

## 2021-08-08 DIAGNOSIS — H538 Other visual disturbances: Secondary | ICD-10-CM | POA: Diagnosis not present

## 2021-08-08 DIAGNOSIS — F0282 Dementia in other diseases classified elsewhere, unspecified severity, with psychotic disturbance: Secondary | ICD-10-CM | POA: Diagnosis not present

## 2021-08-08 DIAGNOSIS — M1712 Unilateral primary osteoarthritis, left knee: Secondary | ICD-10-CM | POA: Diagnosis not present

## 2021-08-14 ENCOUNTER — Telehealth: Payer: Self-pay

## 2021-08-14 NOTE — Telephone Encounter (Signed)
Per insurance criteria, I called the patient to see what his pain score is currently. He states it has gotten up to an 8 now. He is in quite a bit of pain. Ready to have the procedure.

## 2021-08-21 ENCOUNTER — Other Ambulatory Visit: Payer: Self-pay

## 2021-08-21 ENCOUNTER — Telehealth: Payer: Self-pay

## 2021-08-21 NOTE — Progress Notes (Signed)
Instructions given with sedation to wife.  Instructed to stop plavix for 7 days prior to procedure.

## 2021-08-21 NOTE — Telephone Encounter (Signed)
Patient is coming for a RFA on 8/29. His wife wants a nurse to call them with the instructions. She says he is on plavix.

## 2021-08-24 ENCOUNTER — Other Ambulatory Visit: Payer: Self-pay | Admitting: *Deleted

## 2021-08-24 DIAGNOSIS — C61 Malignant neoplasm of prostate: Secondary | ICD-10-CM

## 2021-08-25 ENCOUNTER — Other Ambulatory Visit: Payer: Medicare HMO

## 2021-08-25 DIAGNOSIS — C61 Malignant neoplasm of prostate: Secondary | ICD-10-CM | POA: Diagnosis not present

## 2021-08-26 LAB — PSA: Prostate Specific Ag, Serum: <0.1 ng/mL (ref 0.0–4.0)

## 2021-08-29 ENCOUNTER — Ambulatory Visit: Payer: Medicare HMO | Admitting: Urology

## 2021-08-29 ENCOUNTER — Encounter: Payer: Self-pay | Admitting: Urology

## 2021-08-29 VITALS — BP 124/76 | HR 91 | Ht 65.0 in | Wt 252.0 lb

## 2021-08-29 DIAGNOSIS — Z8546 Personal history of malignant neoplasm of prostate: Secondary | ICD-10-CM | POA: Diagnosis not present

## 2021-08-29 DIAGNOSIS — N3281 Overactive bladder: Secondary | ICD-10-CM

## 2021-08-29 DIAGNOSIS — C61 Malignant neoplasm of prostate: Secondary | ICD-10-CM

## 2021-08-29 LAB — BLADDER SCAN AMB NON-IMAGING: Scan Result: 0

## 2021-08-29 NOTE — Progress Notes (Signed)
08/29/21 11:21 AM   Taylor Austin 1940-11-28 [id number]  Referring provider:  Juluis Pitch, MD [id number],  [place] [id number] No chief complaint on file.      HPI: Taylor Austin is a 81 y.o.male with a personal history of ZIQUAN FIDEL is a 81 y.o.male with a personal history of prostate cancer, voiding dysfunction, OAB, who presents today for 6 month follow-up with, PSA, IPSS and PVR.     His surgical pathology is consistent with intermediate Gleason 4+3 prostate cancer. He is s/p IMRT in 12/2017.    Prostate biopsy on 08/13/2017 revealed 2 cores positive of disease, 2% of the left base described as 4+3 prostate cancer as well as Gleason 3+3 at the left mid involving only 2%.  There is also a suspicious lesion in the left apex but this was nondiagnostic.   UDS 12/2017  indicated that he may also have dysfunctional voiding and OAB. Previously tried multiple medications including anticholinergics, finasteride, Myrbetriq, PTNS without any improvement.  He has not been able to participate in physical therapy due to issues with dizziness, had recommended vestibular therapist first prior to pelvic floor therapy.  His PSA remains undetectable.   He recently had a stroke hospitalized for this.  He received tPA.  Deficits are primarily ocular/visual.  He reports that he drink carbonated water and coffee. He reports some urinary frequency and nocturia x3.Marland Kitchen He reports no improvement on sildenafil for his erections.   IPSS 24 and PVR 40m.    IPSS     Row Name 08/29/21 1100         International Prostate Symptom Score   How often have you had the sensation of not emptying your bladder? Almost always     How often have you had to urinate less than every two hours? Almost always     How often have you found you stopped and started again several times when you urinated? Less than 1 in 5 times     How often have you found it difficult to postpone urination? Almost  always     How often have you had a weak urinary stream? Almost always     How often have you had to strain to start urination? Not at All     How many times did you typically get up at night to urinate? 3 Times     Total IPSS Score 24       Quality of Life due to urinary symptoms   If you were to spend the rest of your life with your urinary condition just the way it is now how would you feel about that? Mostly Disatisfied              Score:  1-7 Mild 8-19 Moderate 20-35 Severe  PMH: Past Medical History:  Diagnosis Date   Allergy    Anemia    Arthritis    Back pain    Barrett esophagus    BPH (benign prostatic hyperplasia)    Chronic kidney disease    COPD (chronic obstructive pulmonary disease) (HCC)    Coronary artery disease    Depression    Diabetes mellitus without complication (HCC)    Diverticulitis    Dysphagia    Esophageal reflux    Esophageal reflux    GERD (gastroesophageal reflux disease)    Heart murmur    Hyperlipidemia    Hypersomnia    Hypertension    Kidney stones  Low testosterone    OAB (overactive bladder)    Obesity    Presence of dental bridge    2 - top   Rheumatic fever    Sleep apnea    BiPAP   Stroke ([place]) 01/2016   Vertigo    Vitamin B12 deficiency     Surgical History: Past Surgical History:  Procedure Laterality Date   BACK SURGERY     CARDIAC CATHETERIZATION  08/11/2014   Procedure: Left Heart Cath and Coronary Angiography;  Surgeon: Teodoro Spray, MD;  Location: [facility];  Service: Cardiovascular;;   CARDIAC CATHETERIZATION N/A 11/17/2014   Procedure: Right Heart Cath;  Surgeon: Teodoro Spray, MD;  Location: [facility];  Service: Cardiovascular;  Laterality: N/A;   CAROTID STENT     CATARACT EXTRACTION W/PHACO Left 04/03/2016   Procedure: CATARACT EXTRACTION PHACO AND INTRAOCULAR LENS PLACEMENT ([place]) left diabetic;  Surgeon: Eulogio Bear, MD;  Location: [facility];  Service:  Ophthalmology;  Laterality: Left;  diabetic - oral meds sleep apnea   CATARACT EXTRACTION W/PHACO Right 05/01/2016   Procedure: CATARACT EXTRACTION PHACO AND INTRAOCULAR LENS PLACEMENT ([place]) Right diabetic;  Surgeon: Eulogio Bear, MD;  Location: [facility];  Service: Ophthalmology;  Laterality: Right;  Diabetic oral meds sleep apnea   CERVICAL SPINE SURGERY     COLONOSCOPY     COLONOSCOPY WITH PROPOFOL N/A 06/10/2015   Procedure: COLONOSCOPY WITH PROPOFOL;  Surgeon: Manya Silvas, MD;  Location: [facility] END[facility];  Service: Endoscopy;  Laterality: N/A;   CORONARY ANGIOPLASTY     ESOPHAGOGASTRODUODENOSCOPY (EGD) WITH PROPOFOL N/A 06/28/2017   Procedure: ESOPHAGOGASTRODUODENOSCOPY (EGD) WITH PROPOFOL;  Surgeon: Manya Silvas, MD;  Location: [facility] ENDOSCOPY;  Service: Endoscopy;  Laterality: N/A;   ESOPHAGOGASTRODUODENOSCOPY ENDOSCOPY     EYE SURGERY     LUMBAR SPINE SURGERY     THORACIC SPINE SURGERY     TONSILLECTOMY      Home Medications:  Allergies as of 08/29/2021       Reactions   Hydrocodone Shortness Of Breath   Oxycodone-acetaminophen Shortness Of Breath, Swelling   Confirmed with wife- tongue swelling and SOB after oxycodone        Medication List        Accurate as of August 29, 2021 11:21 AM. If you have any questions, ask your nurse or doctor.          aspirin EC 81 MG tablet Take 81 mg by mouth every evening.   aspirin-acetaminophen-caffeine 250-250-65 MG tablet Commonly known as: EXCEDRIN MIGRAINE Take 1-2 tablets by mouth every 6 (six) hours as needed for headache.   atorvastatin 80 MG tablet Commonly known as: LIPITOR Take 0.5 tablets (40 mg total) by mouth every evening.   calcium carbonate 1500 (600 Ca) MG Tabs tablet Commonly known as: OSCAL Take 600 mg of elemental calcium by mouth daily with lunch.   clopidogrel 75 MG tablet Commonly known as: PLAVIX Take 1 tablet (75 mg total) by mouth daily.   cyanocobalamin 1000 MCG  tablet Commonly known as: VITAMIN B12 Take 1,000 mcg by mouth daily with lunch.   donepezil 10 MG tablet Commonly known as: ARICEPT Take 10 mg by mouth every evening.   DULoxetine 60 MG capsule Commonly known as: CYMBALTA Take 60 mg by mouth daily with supper.   finasteride 5 MG tablet Commonly known as: PROSCAR TAKE 1 TABLET EVERY DAY   gabapentin 100 MG capsule Commonly known as: NEURONTIN Take 100  mg by mouth 5 (five) times daily. Take 2 capsules ('600mg'$ ) by mouth at midday and take 1 capsule ('300mg'$ ) by mouth every evening   glimepiride 4 MG tablet Commonly known as: AMARYL Take 4 mg by mouth in the morning.   losartan 100 MG tablet Commonly known as: COZAAR Take 50 mg by mouth daily.   magnesium oxide 400 MG tablet Commonly known as: MAG-OX Take 400 mg by mouth daily with lunch.   metFORMIN 500 MG 24 hr tablet Commonly known as: GLUCOPHAGE-XR Take 1,000 mg by mouth 2 (two) times daily with a meal.   Multi-Vitamins Tabs Take 1 tablet by mouth daily with lunch.   oxymetazoline 0.05 % nasal spray Commonly known as: AFRIN Place 1 spray into both nostrils 2 (two) times daily as needed for congestion.   pantoprazole 40 MG tablet Commonly known as: PROTONIX Take 40 mg by mouth daily before breakfast.   pioglitazone 15 MG tablet Commonly known as: ACTOS Take 45 mg by mouth every evening.   sildenafil 20 MG tablet Commonly known as: REVATIO 1-5 tablets as needed one hour prior to intercourse   tamsulosin 0.4 MG Caps capsule Commonly known as: FLOMAX Take 1 capsule (0.4 mg total) by mouth daily after supper.   Tiotropium Bromide Monohydrate 2.5 MCG/ACT Aers Inhale 2 each into the lungs at bedtime.   Xultophy 100-3.6 UNIT-MG/ML Sopn Generic drug: Insulin Degludec-Liraglutide Inject 50 Units into the skin daily. (Only use if blood glucose is >70)        Allergies:  Allergies  Allergen Reactions   Hydrocodone Shortness Of Breath    Oxycodone-Acetaminophen Shortness Of Breath and Swelling    Confirmed with wife- tongue swelling and SOB after oxycodone    Family History: Family History  Problem Relation Age of Onset   Stroke Father    Anesthesia problems Father    Anesthesia problems Mother    Prostate cancer Brother    Kidney disease Neg Hx    Bladder Cancer Neg Hx     Social History:  reports that he quit smoking about 48 years ago. His smoking use included cigarettes. He has never used smokeless tobacco. He reports current alcohol use of about 1.0 standard drink of alcohol per week. He reports that he does not use drugs.   Physical Exam: BP 124/76   Pulse 91   Ht '5\' 5"'$  (1.651 m)   Wt 252 lb (114.3 kg)   BMI 41.93 kg/m   Constitutional:  Alert and oriented, No acute distress. HEENT: [place] AT, moist mucus membranes.  Trachea midline, no masses. Cardiovascular: No clubbing, cyanosis, or edema. Respiratory: Normal respiratory effort, no increased work of breathing. Skin: No rashes, bruises or suspicious lesions. Neurologic: Grossly intact, no focal deficits, moving all 4 extremities. Psychiatric: Normal mood and affect.  Laboratory Data: Lab Results  Component Value Date   CREATININE 0.96 07/26/2021   Lab Results  Component Value Date   HGBA1C 6.6 (H) 07/24/2021    Pertinent Imaging: Results for orders placed or performed in visit on 08/29/21  Bladder Scan (Post Void Residual) in office  Result Value Ref Range   Scan Result 0       Assessment & Plan:   Prostate cancer ([place])  -NED Status post ADT x6 months with XRT - PSA has remained undetectable  - Return in 1 year for PSA   2. Severe OAB/ possible pelvic floor dysfunction  - Severe urinary symptoms has failed maximal medical therapy previously failed all anticholinergics. Failed  PTNS - Unable to proceed with physical therapy  - Discussed Botox versus behavioral modification such as cutting back on coffee - He is emptying adequately today   -Ideally could participate in pelvic floor therapy but unable to do so in light of multiple medical conditions. -Discussed that options moving forward are limited, encouraged to focus on behavior as primary intervention  F/u 1 year PSA/ IPSS  I,Kailey Littlejohn,acting as a scribe for Hollice Espy, MD.,have documented all relevant documentation on the behalf of Hollice Espy, MD,as directed by  Hollice Espy, MD while in the presence of Hollice Espy, MD.  I have reviewed the above documentation for accuracy and completeness, and I agree with the above.   Hollice Espy, MD  [facility] [address] [place], [place] [address] [phone]  I spent 31 total minutes on the day of the encounter including pre-visit review of the medical record, face-to-face time with the patient, and post visit ordering of labs/imaging/tests.

## 2021-08-31 DIAGNOSIS — Z0100 Encounter for examination of eyes and vision without abnormal findings: Secondary | ICD-10-CM | POA: Diagnosis not present

## 2021-08-31 DIAGNOSIS — E119 Type 2 diabetes mellitus without complications: Secondary | ICD-10-CM | POA: Diagnosis not present

## 2021-09-05 ENCOUNTER — Encounter: Payer: Self-pay | Admitting: Pain Medicine

## 2021-09-05 ENCOUNTER — Telehealth: Payer: Self-pay

## 2021-09-05 ENCOUNTER — Other Ambulatory Visit: Payer: Self-pay

## 2021-09-05 ENCOUNTER — Ambulatory Visit: Payer: Medicare HMO | Attending: Pain Medicine | Admitting: Pain Medicine

## 2021-09-05 VITALS — BP 131/83 | HR 70 | Temp 97.2°F | Resp 16 | Ht 65.0 in | Wt 252.0 lb

## 2021-09-05 DIAGNOSIS — Z8673 Personal history of transient ischemic attack (TIA), and cerebral infarction without residual deficits: Secondary | ICD-10-CM | POA: Insufficient documentation

## 2021-09-05 DIAGNOSIS — M47816 Spondylosis without myelopathy or radiculopathy, lumbar region: Secondary | ICD-10-CM | POA: Insufficient documentation

## 2021-09-05 NOTE — Patient Instructions (Signed)
____________________________________________________________________________________________  Blood Thinners  IMPORTANT NOTICE:  If you take any of these, make sure to notify the nursing staff.  Failure to do so may result in injury.  Recommended time intervals to stop and restart blood-thinners, before & after invasive procedures  Generic Name Brand Name Pre-procedure. Stop this long before procedure. Post-procedure. Minimum waiting period before restarting.  Abciximab Reopro 15 days 2 hrs  Alteplase Activase 10 days 10 days  Anagrelide Agrylin    Apixaban Eliquis 3 days 6 hrs  Cilostazol Pletal 3 days 5 hrs  Clopidogrel Plavix 7-10 days 2 hrs  Dabigatran Pradaxa 5 days 6 hrs  Dalteparin Fragmin 24 hours 4 hrs  Dipyridamole Aggrenox 11days 2 hrs  Edoxaban Lixiana; Savaysa 3 days 2 hrs  Enoxaparin  Lovenox 24 hours 4 hrs  Eptifibatide Integrillin 8 hours 2 hrs  Fondaparinux  Arixtra 72 hours 12 hrs  Hydroxychloroquine Plaquenil 11 days   Prasugrel Effient 7-10 days 6 hrs  Reteplase Retavase 10 days 10 days  Rivaroxaban Xarelto 3 days 6 hrs  Ticagrelor Brilinta 5-7 days 6 hrs  Ticlopidine Ticlid 10-14 days 2 hrs  Tinzaparin Innohep 24 hours 4 hrs  Tirofiban Aggrastat 8 hours 2 hrs  Warfarin Coumadin 5 days 2 hrs   Other medications with blood-thinning effects  Product indications Generic (Brand) names Note  Cholesterol Lipitor Stop 4 days before procedure  Blood thinner (injectable) Heparin (LMW or LMWH Heparin) Stop 24 hours before procedure  Cancer Ibrutinib (Imbruvica) Stop 7 days before procedure  Malaria/Rheumatoid Hydroxychloroquine (Plaquenil) Stop 11 days before procedure  Thrombolytics  10 days before or after procedures   Over-the-counter (OTC) Products with blood-thinning effects  Product Common names Stop Time  Aspirin > 325 mg Goody Powders, Excedrin, etc. 11 days  Aspirin ? 81 mg  7 days  Fish oil  4 days  Garlic supplements  7 days  Ginkgo biloba  36  hours  Ginseng  24 hours  NSAIDs Ibuprofen, Naprosyn, etc. 3 days  Vitamin E  4 days   ____________________________________________________________________________________________   ___________________________________________________________________________________________  Post-Radiofrequency (RF) Discharge Instructions  You have just completed a Radiofrequency Neurotomy.  The following instructions will provide you with information and guidelines for self-care upon discharge.  If at any time you have questions or concerns please call your physician. DO NOT DRIVE YOURSELF!!  Instructions: Apply ice: Fill a plastic sandwich bag with crushed ice. Cover it with a small towel and apply to injection site. Apply for 15 minutes then remove x 15 minutes. Repeat sequence on day of procedure, until you go to bed. The purpose is to minimize swelling and discomfort after procedure. Apply heat: Apply heat to procedure site starting the day following the procedure. The purpose is to treat any soreness and discomfort from the procedure. Food intake: No eating limitations, unless stipulated above.  Nevertheless, if you have had sedation, you may experience some nausea.  In this case, it may be wise to wait at least two hours prior to resuming regular diet. Physical activities: Keep activities to a minimum for the first 8 hours after the procedure. For the first 24 hours after the procedure, do not drive a motor vehicle,  Operate heavy machinery, power tools, or handle any weapons.  Consider walking with the use of an assistive device or accompanied by an adult for the first 24 hours.  Do not drink alcoholic beverages including beer.  Do not make any important decisions or sign any legal documents. Go home and rest today.  Resume activities tomorrow, as tolerated.  Use caution in moving about as you may experience mild leg weakness.  Use caution in cooking, use of household electrical appliances and climbing  steps. Driving: If you have received any sedation, you are not allowed to drive for 24 hours after your procedure. Blood thinner: Restart your blood thinner 6 hours after your procedure. (Only for those taking blood thinners) Insulin: As soon as you can eat, you may resume your normal dosing schedule. (Only for those taking insulin) Medications: May resume pre-procedure medications.  Do not take any drugs, other than what has been prescribed to you. Infection prevention: Keep procedure site clean and dry. Post-procedure Pain Diary: Extremely important that this be done correctly and accurately. Recorded information will be used to determine the next step in treatment. Pain evaluated is that of treated area only. Do not include pain from an untreated area. Complete every hour, on the hour, for the initial 8 hours. Set an alarm to help you do this part accurately. Do not go to sleep and have it completed later. It will not be accurate. Follow-up appointment: Keep your follow-up appointment after the procedure. Usually 2-6 weeks after radiofrequency. Bring you pain diary. The information collected will be essential for your long-term care.   Expect: From numbing medicine (AKA: Local Anesthetics): Numbness or decrease in pain. Onset: Full effect within 15 minutes of injected. Duration: It will depend on the type of local anesthetic used. On the average, 1 to 8 hours.  From steroids (when added): Decrease in swelling or inflammation. Once inflammation is improved, relief of the pain will follow. Onset of benefits: Depends on the amount of swelling present. The more swelling, the longer it will take for the benefits to be seen. In some cases, up to 10 days. Duration: Steroids will stay in the system x 2 weeks. Duration of benefits will depend on multiple posibilities including persistent irritating factors. From procedure: Some discomfort is to be expected once the numbing medicine wears off. In the  case of radiofrequency procedures, this may last as long as 6 weeks. Additional post-procedure pain medication is provided for this. Discomfort is minimized if ice and heat are applied as instructed.  Call if: You experience numbness and weakness that gets worse with time, as opposed to wearing off. He experience any unusual bleeding, difficulty breathing, or loss of the ability to control your bowel and bladder. (This applies to Spinal procedures only) You experience any redness, swelling, heat, red streaks, elevated temperature, fever, or any other signs of a possible infection.  Emergency Numbers: [facility] business hours (Monday - Thursday, 8:00 AM - 4:00 PM) (Friday, 9:00 AM - 12:00 Noon): [phone] After hours: [phone] ____________________________________________________________________________________________

## 2021-09-05 NOTE — Progress Notes (Signed)
PROVIDER NOTE: Information contained herein reflects review and annotations entered in association with encounter. Interpretation of such information and data should be left to medically-trained personnel. Information provided to patient can be located elsewhere in the medical record under "Patient Instructions". Document created using STT-dictation technology, any transcriptional errors that may result from process are unintentional.    Patient: Taylor Austin  Service Category: E/M  Provider: Gaspar Cola, MD  DOB: 31-Jul-1940  DOS: 09/05/2021  Referring Provider: Milinda Pointer, MD  MRN: [id number]  Specialty: Interventional Pain Management  PCP: Juluis Pitch, MD  Type: Established Patient  Setting: Ambulatory outpatient    Location: Office  Delivery: Face-to-face     HPI  Taylor Austin, a 81 y.o. year old male, is here today because of his Lumbar facet joint syndrome [M47.816]. Taylor Austin primary complain today is Back Pain (Lumbar right ) Last encounter: My last encounter with him was on 08/07/2021. Pertinent problems: Taylor Austin has Arthritis of knee; Gait instability; Chronic low back pain (1ry area of Pain) (Bilateral) (R>L) w/o sciatica; Lumbar stenosis with neurogenic claudication; Chronic pain syndrome; Abnormal MRI, cervical spine (11/14/2019); Abnormal MRI, lumbar spine (11/14/2019); History of lumbar laminectomy; History of fusion of cervical spine; DDD (degenerative disc disease), cervical; DDD (degenerative disc disease), lumbosacral; Lumbosacral facet arthropathy (Multilevel) (Bilateral); Cervical facet hypertrophy; DJD (degenerative joint disease) of cervical spine; Lumbar facet joint pain (Bilateral); Spondylosis without myelopathy or radiculopathy, lumbosacral region; Retrolisthesis of L2 over L3; and Lumbar facet syndrome on their pertinent problem list. Pain Assessment: Severity of Chronic pain is reported as a 6 /10. Location: Back Lower, Right/into hip  and down the right leg. Onset: More than a month ago. Quality: Discomfort, Constant, Other (Comment), Shooting (nerve pain that goes right down the leg). Timing: Constant. Modifying factor(s): sitting down. Vitals:  height is _0  (1.651 m) and weight is 252 lb (114.3 kg). His temporal temperature is 97.2 F (36.2 C) (abnormal). His blood pressure is 131/83 and his pulse is 70. His respiration is 16 and oxygen saturation is 95%.   Reason for encounter: The patient presents today for the left-sided lumbar facet radiofrequency ablation under fluoroscopic guidance and IV sedation.  During the precharting review of records I came across and note where it indicated that the patient had had an ischemic stroke on 07/23/2021 where he received tPA to treat the acute occipital infarct.  Based on recommended guidelines, the patient should stay away from elective procedures/surgeries for 6 to 9 months.  Today we have counseled the procedure, we have informed the patient of those guidelines, and we have recommended that they go back on the blood thinners as soon as possible.  The 65-monthmark from his 07/23/2021 ischemic stroke would be on 01/23/2022.  After that date, we will reconsider scheduling him again for the left-sided lumbar facet RFA.   Pharmacotherapy Assessment  Analgesic: None. No chronic opioid analgesics therapy prescribed by our practice. MME/day: 0 mg/day   Monitoring: [place] PMP: PDMP reviewed during this encounter.       Pharmacotherapy: No side-effects or adverse reactions reported. Compliance: No problems identified. Effectiveness: Clinically acceptable.  No notes on file  No results found for: "CBDTHCR" No results found for: "D8THCCBX" No results found for: "D9THCCBX"  UDS:  Summary  Date Value Ref Range Status  06/15/2020 Note  Final    Comment:    ==================================================================== Compliance Drug Analysis,  Ur ==================================================================== Test  Result       Flag       Units  Drug Present and Declared for Prescription Verification   Gabapentin                     PRESENT      EXPECTED   Duloxetine                     PRESENT      EXPECTED  Drug Present not Declared for Prescription Verification   Acetaminophen                  PRESENT      UNEXPECTED   Ibuprofen                      PRESENT      UNEXPECTED   Diphenhydramine                PRESENT      UNEXPECTED   Lidocaine                      PRESENT      UNEXPECTED  Drug Absent but Declared for Prescription Verification   Salicylate                     Not Detected UNEXPECTED    Aspirin, as indicated in the declared medication list, is not always    detected even when used as directed.  ==================================================================== Test                      Result    Flag   Units      Ref Range   Creatinine              34               mg/dL      >=20 ==================================================================== Declared Medications:  The flagging and interpretation on this report are based on the  following declared medications.  Unexpected results may arise from  inaccuracies in the declared medications.   **Note: The testing scope of this panel includes these medications:   Duloxetine (Cymbalta)  Gabapentin (Neurontin)   **Note: The testing scope of this panel does not include small to  moderate amounts of these reported medications:   Aspirin   **Note: The testing scope of this panel does not include the  following reported medications:   Albuterol (Ventolin HFA)  Atorvastatin (Lipitor)  Calcium  Canagliflozin (Invokana)  Clopidogrel (Plavix)  Cyanocobalamin  Donepezil (Aricept)  Finasteride (Proscar)  Glimepiride (Amaryl)  Glyburide (Glucovance)  Losartan (Cozaar)  Magnesium (Mag-Ox)  Metformin (Glucovance)   Multivitamin  Oxymetazoline (Afrin)  Pantoprazole (Protonix)  Pioglitazone (Actos)  Probiotic  Sildenafil  Tamsulosin (Flomax)  Umeclidinium (Anoro)  Vilanterol (Anoro) ==================================================================== For clinical consultation, please call [phone]. ====================================================================       ROS  Constitutional: Denies any fever or chills Gastrointestinal: No reported hemesis, hematochezia, vomiting, or acute GI distress Musculoskeletal: Denies any acute onset joint swelling, redness, loss of ROM, or weakness Neurological: No reported episodes of acute onset apraxia, aphasia, dysarthria, agnosia, amnesia, paralysis, loss of coordination, or loss of consciousness  Medication Review  DULoxetine, Insulin Degludec-Liraglutide, Multi-Vitamins, Tiotropium Bromide Monohydrate, aspirin EC, aspirin-acetaminophen-caffeine, atorvastatin, calcium carbonate, clopidogrel, cyanocobalamin, donepezil, finasteride, gabapentin, glimepiride, losartan, magnesium oxide, metFORMIN, oxymetazoline, pantoprazole, pioglitazone, sildenafil, and tamsulosin  History Review  Allergy: Taylor Austin  is allergic to hydrocodone and oxycodone-acetaminophen. Drug: Taylor Austin  reports no history of drug use. Alcohol:  reports current alcohol use of about 1.0 standard drink of alcohol per week. Tobacco:  reports that he quit smoking about 48 years ago. His smoking use included cigarettes. He has never used smokeless tobacco. Social: Taylor Austin  reports that he quit smoking about 48 years ago. His smoking use included cigarettes. He has never used smokeless tobacco. He reports current alcohol use of about 1.0 standard drink of alcohol per week. He reports that he does not use drugs. Medical:  has a past medical history of Allergy, Anemia, Arthritis, Back pain, Barrett esophagus, BPH (benign prostatic hyperplasia), Chronic kidney disease, COPD  (chronic obstructive pulmonary disease) ([place]), Coronary artery disease, Depression, Diabetes mellitus without complication ([place]), Diverticulitis, Dysphagia, Esophageal reflux, Esophageal reflux, GERD (gastroesophageal reflux disease), Heart murmur, Hyperlipidemia, Hypersomnia, Hypertension, Kidney stones, Low testosterone, OAB (overactive bladder), Obesity, Presence of dental bridge, Rheumatic fever, Sleep apnea, Stroke ([place]) (01/2016), Vertigo, and Vitamin B12 deficiency. Surgical: Taylor Austin  has a past surgical history that includes Lumbar spine surgery; Cervical spine surgery; Thoracic spine surgery; Colonoscopy; Esophagogastroduodenoscopy endoscopy; Carotid stent; Back surgery; Tonsillectomy; Coronary angioplasty; Cardiac catheterization (08/11/2014); Cardiac catheterization (N/A, 11/17/2014); Colonoscopy with propofol (N/A, 06/10/2015); Cataract extraction w/PHACO (Left, 04/03/2016); Cataract extraction w/PHACO (Right, 05/01/2016); Eye surgery; and Esophagogastroduodenoscopy (egd) with propofol (N/A, 06/28/2017). Family: family history includes Anesthesia problems in his father and mother; Prostate cancer in his brother; Stroke in his father.  Laboratory Chemistry Profile   Renal Lab Results  Component Value Date   BUN 24 (H) 07/26/2021   CREATININE 0.96 07/26/2021   GFRAA >60 02/02/2016   GFRNONAA >60 07/26/2021    Hepatic Lab Results  Component Value Date   AST 22 07/23/2021   ALT 20 07/23/2021   ALBUMIN 4.1 07/23/2021   ALKPHOS 40 07/23/2021    Electrolytes Lab Results  Component Value Date   NA 134 (L) 07/26/2021   K 4.9 07/26/2021   CL 102 07/26/2021   CALCIUM 9.0 07/26/2021   MG 1.9 07/26/2021   PHOS 4.3 07/26/2021    Bone Lab Results  Component Value Date   25OHVITD1 44 06/15/2020   25OHVITD2 <1.0 06/15/2020   25OHVITD3 43 06/15/2020    Inflammation (CRP: Acute Phase) (ESR: Chronic Phase) Lab Results  Component Value Date   CRP 0.6 06/15/2020   ESRSEDRATE 13  06/15/2020         Note: Above Lab results reviewed.  Recent Imaging Review  ECHOCARDIOGRAM COMPLETE    ECHOCARDIOGRAM REPORT       Patient Name:   TREMAR WICKENS Date of Exam: 07/25/2021 Medical Rec #:  [id number]        Height:       65.0 in Accession #:    [id number]       Weight:       245.6 lb Date of Birth:  1940/04/15        BSA:          2.159 m Patient Age:    75 years         BP:           118/65 mmHg Patient Gender: M                HR:           62 bpm. Exam Location:  [facility]  Procedure: 2D Echo, Cardiac Doppler and Color Doppler  Indications:  I63.9 Stroke   History:         Patient has prior history of Echocardiogram examinations, most                  recent 01/31/2016. CAD, Coronary stent, COPD; Risk                  Factors:Former Smoker, Diabetes, Hypertension, Dyslipidemia and                  Sleep Apnea.   Sonographer:     Rosalia Hammers Referring Phys:  [id number] BRITTON L RUST-CHESTER Diagnosing Phys: Yolonda Kida MD    Sonographer Comments: Technically challenging study due to limited acoustic windows. Image acquisition challenging due to patient body habitus and Image acquisition challenging due to respiratory motion. IMPRESSIONS   1. Left ventricular ejection fraction, by estimation, is 65 to 70%. The left ventricle has normal function. The left ventricle has no regional wall motion abnormalities. The left ventricular internal cavity size was moderately dilated. There is moderate  concentric left ventricular hypertrophy. Left ventricular diastolic parameters are consistent with Grade I diastolic dysfunction (impaired relaxation).  2. Right ventricular systolic function is normal. The right ventricular size is normal.  3. Left atrial size was mildly dilated.  4. Right atrial size was mildly dilated.  5. The mitral valve is normal in structure. Trivial mitral valve regurgitation.  6. The aortic valve is grossly normal. Aortic valve regurgitation is  not visualized.  Conclusion(s)/Recommendation(s): Poor windows for evaluation of left ventricular function by transthoracic echocardiography. Would recommend an alternative means of evaluation.  FINDINGS  Left Ventricle: Left ventricular ejection fraction, by estimation, is 65 to 70%. The left ventricle has normal function. The left ventricle has no regional wall motion abnormalities. The left ventricular internal cavity size was moderately dilated.  There is moderate concentric left ventricular hypertrophy. Left ventricular diastolic parameters are consistent with Grade I diastolic dysfunction (impaired relaxation).  Right Ventricle: The right ventricular size is normal. No increase in right ventricular wall thickness. Right ventricular systolic function is normal.  Left Atrium: Left atrial size was mildly dilated.  Right Atrium: Right atrial size was mildly dilated.  Pericardium: There is no evidence of pericardial effusion.  Mitral Valve: The mitral valve is normal in structure. Trivial mitral valve regurgitation.  Tricuspid Valve: The tricuspid valve is normal in structure. Tricuspid valve regurgitation is mild.  Aortic Valve: The aortic valve is grossly normal. Aortic valve regurgitation is not visualized. Aortic valve mean gradient measures 6.0 mmHg. Aortic valve peak gradient measures 10.1 mmHg. Aortic valve area, by VTI measures 2.02 cm.  Pulmonic Valve: The pulmonic valve was normal in structure. Pulmonic valve regurgitation is not visualized.  Aorta: The ascending aorta was not well visualized.  IAS/Shunts: No atrial level shunt detected by color flow Doppler.  Additional Comments: There is no pleural effusion.    LEFT VENTRICLE PLAX 2D LVIDd:         5.75 cm   Diastology LVIDs:         3.65 cm   LV e' medial:    4.98 cm/s LV PW:         1.82 cm   LV E/e' medial:  10.8 LV IVS:        1.62 cm   LV e' lateral:   5.87 cm/s LVOT diam:     2.10 cm   LV E/e' lateral: 9.1 LV  SV:  67 LV SV Index:   31 LVOT Area:     3.46 cm    RIGHT VENTRICLE RV Basal diam:  3.50 cm RV S prime:     14.50 cm/s TAPSE (M-mode): 1.7 cm  LEFT ATRIUM             Index        RIGHT ATRIUM           Index LA diam:        4.00 cm 1.85 cm/m   RA Area:     13.30 cm LA Vol (A2C):   47.2 ml 21.87 ml/m  RA Volume:   28.40 ml  13.16 ml/m LA Vol (A4C):   55.9 ml 25.90 ml/m LA Biplane Vol: 52.4 ml 24.28 ml/m  AORTIC VALVE AV Area (Vmax):    2.18 cm AV Area (Vmean):   2.13 cm AV Area (VTI):     2.02 cm AV Vmax:           159.00 cm/s AV Vmean:          113.000 cm/s AV VTI:            0.330 m AV Peak Grad:      10.1 mmHg AV Mean Grad:      6.0 mmHg LVOT Vmax:         100.00 cm/s LVOT Vmean:        69.400 cm/s LVOT VTI:          0.192 m LVOT/AV VTI ratio: 0.58   AORTA Ao Root diam: 4.20 cm  MITRAL VALVE MV Area (PHT): 2.90 cm    SHUNTS MV Decel Time: 262 msec    Systemic VTI:  0.19 m MV E velocity: 53.60 cm/s  Systemic Diam: 2.10 cm MV A velocity: 94.70 cm/s MV E/A ratio:  0.57  Dwayne D Callwood MD Electronically signed by Yolonda Kida MD Signature Date/Time: 07/25/2021/4:59:00 PM      Final   Note: Reviewed        Physical Exam  General appearance: Well nourished, well developed, and well hydrated. In no apparent acute distress Mental status: Alert, oriented x 3 (person, place, & time)       Respiratory: No evidence of acute respiratory distress Eyes: PERLA Vitals: BP 131/83 (BP Location: Right Arm, Patient Position: Sitting, Cuff Size: Normal) Comment (Cuff Size): forearm  Pulse 70   Temp (!) 97.2 F (36.2 C) (Temporal)   Resp 16   Ht _0  (1.651 m)   Wt 252 lb (114.3 kg)   SpO2 95%   BMI 41.93 kg/m  BMI: Estimated body mass index is 41.93 kg/m as calculated from the following:   Height as of this encounter: _1  (1.651 m).   Weight as of this encounter: 252 lb (114.3 kg). Ideal: Ideal body weight: 61.5 kg (135 lb 9.3 oz) Adjusted  ideal body weight: 82.6 kg (182 lb 2.4 oz)  Assessment   Diagnosis Status  1. Lumbar facet syndrome   2. History of CVA (cerebrovascular accident)    Controlled Controlled Controlled   Updated Problems: Problem  Chronic Diastolic Chf (Congestive Heart Failure) (Hcc)   Formatting of this note might be different from the original. Last Assessment & Plan: Formatting of this note might be different from the original. Euvolemic.  Incidentally noted on echocardiogram   Ischemic Stroke (Hcc)  Acute Ischemic Stroke (Hcc)  Cerebral Infarction, Unspecified (Hcc)   Formatting of this note might be different from the original.  Last Assessment & Plan: Formatting of this note might be different from the original. Status post tPA.  While vascular intervention seems to have helped his speech and weakness, has not helped his visual deficits.  He will continue in the outpatient therapy for balance compensation Patient already on aspirin and Plavix which was continued.  Limited amount of intervention.  LDL at 69, meeting therapy goals.  That said, Lipitor increased from 40 mg to 80 mg A1c at 6.6, which is in controlled range.  However in discussion with patient, I wife and patient confirmed that he has intermittent episodes of hypoglycemia.  Patient on Xultophy with instructions to take every morning if CBG less than 70.  Patient's wife sometimes will not give this medication if blood sugar is 80 or 90 with concerns that he may have low blood sugars.  She states at times, over the next 24 hours, his blood sugar does drop into as low as 30s or 40s.  Advised patient's wife to give Xultophy only if blood sugar is less than 70.  However then to keep track and note days on which she has hypoglycemia.  She will discuss this with his PCP.  Patient may be also having hyperglycemia episodes which counterbalanced episodes of hypoglycemia and therefore not at range.  In regards to blood pressure, she will get a blood  pressure cuff and keep a closer eye on his blood pressures to ensure that he has no blood pressure spikes.  Seen by PT and OT who recommended home health.  Outpatient neurology follow-up.   History of Cva (Cerebrovascular Accident)   The patient presents today for the left-sided lumbar facet radiofrequency ablation under fluoroscopic guidance and IV sedation.  During the precharting review of records I came across and note where it indicated that the patient had had an ischemic stroke on 07/23/2021 where he received tPA to treat the acute occipital infarct.  Based on recommended guidelines, the patient should stay away from elective procedures/surgeries for 6 to 9 months.  Today we have counseled the procedure, we have informed the patient of those guidelines, and we have recommended that they go back on the blood thinners as soon as possible.  The 21-monthmark from his 07/23/2021 ischemic stroke would be on 01/23/2022.  After that date, we will reconsider scheduling him again for the left-sided lumbar facet RFA.    Poor Historian   The patient is a very poor historian.  He tends to constantly interrupt and has difficulty with allowing others to communicate with him.  Formatting of this note might be different from the original. Formatting of this note might be different from the original. The patient is a very poor historian.  He tends to constantly interrupt and has difficulty with allowing others to communicate with him.   Prostate Cancer (Hcc)    Plan of Care  Problem-specific:  No problem-specific Assessment & Plan notes found for this encounter.  Mr. JBENNIE CHIRICOhas a current medication list which includes the following long-term medication(s): atorvastatin, calcium carbonate, donepezil, duloxetine, gabapentin, glimepiride, losartan, pantoprazole, pioglitazone, and tiotropium bromide monohydrate.  Pharmacotherapy (Medications Ordered): No orders of the defined types were placed in this  encounter.  Orders:  No orders of the defined types were placed in this encounter.  Follow-up plan:   Return for Eval-day (M,W), (VV) post-stroke plan.     Interventional Therapies  Risk  Complexity Considerations:   Estimated body mass index is 43.27 kg/m as calculated from the  following:   Height as of this encounter: _0  (1.651 m).   Weight as of this encounter: 260 lb (117.9 kg).  PLAVIX Anticoagulation (Stop: 7-10 days  Restart: 2 hours)  2nd Ischemic Stroke on 07/23/2021. No procedures until after 01/23/2022.   Planned  Pending:   Therapeutic right lumbar facet RFA #1 (Hold until he recovers from stroke)    Under consideration:   Therapeutic bilateral lumbar facet RFA #1 (Hold until he recovers from stroke)    Completed:   Diagnostic/therapeutic bilateral lumbar facet MBB x2 (07/18/2021) (2nd:100/100/50/50) (1st:100/100/100/100)    Completed by Girtha Hake, MD: ([facility]) Left L4 TFESI x1 (03/03/2020) (0/0/0/0) (however, currently having no left lower extremity pain)  Left L5 TFESI x2 (01/14/2020; 02/11/2020) (100/100/0/0) (however, currently having no left lower extremity pain)    Therapeutic  Palliative (PRN) options:   None established    Recent Visits Date Type Provider Dept  08/07/21 Office Visit Milinda Pointer, MD [facility]  07/18/21 Procedure visit Milinda Pointer, MD [facility]  06/19/21 Office Visit Milinda Pointer, MD [facility]  Showing recent visits within past 90 days and meeting all other requirements Today's Visits Date Type Provider Dept  09/05/21 Procedure visit Milinda Pointer, MD [facility]  Showing today's visits and meeting all other requirements Future Appointments No visits were found meeting these conditions. Showing future appointments within next 90 days and meeting all other requirements  I discussed the assessment and treatment plan with the patient. The patient  was provided an opportunity to ask questions and all were answered. The patient agreed with the plan and demonstrated an understanding of the instructions.  Patient advised to call back or seek an in-person evaluation if the symptoms or condition worsens.  Duration of encounter: 25 minutes.  Total time on encounter, as per AMA guidelines included both the face-to-face and non-face-to-face time personally spent by the physician and/or other qualified health care professional(s) on the day of the encounter (includes time in activities that require the physician or other qualified health care professional and does not include time in activities normally performed by clinical staff). Physician's time may include the following activities when performed: preparing to see the patient (eg, review of tests, pre-charting review of records) obtaining and/or reviewing separately obtained history performing a medically appropriate examination and/or evaluation counseling and educating the patient/family/caregiver ordering medications, tests, or procedures referring and communicating with other health care professionals (when not separately reported) documenting clinical information in the electronic or other health record independently interpreting results (not separately reported) and communicating results to the patient/ family/caregiver care coordination (not separately reported)  Note by: Gaspar Cola, MD Date: 09/05/2021; Time: 11:51 AM

## 2021-09-06 DIAGNOSIS — F0280 Dementia in other diseases classified elsewhere without behavioral disturbance: Secondary | ICD-10-CM | POA: Diagnosis not present

## 2021-09-06 DIAGNOSIS — G309 Alzheimer's disease, unspecified: Secondary | ICD-10-CM | POA: Diagnosis not present

## 2021-09-06 DIAGNOSIS — F0150 Vascular dementia without behavioral disturbance: Secondary | ICD-10-CM | POA: Diagnosis not present

## 2021-09-06 DIAGNOSIS — G608 Other hereditary and idiopathic neuropathies: Secondary | ICD-10-CM | POA: Diagnosis not present

## 2021-09-06 DIAGNOSIS — R519 Headache, unspecified: Secondary | ICD-10-CM | POA: Diagnosis not present

## 2021-09-06 DIAGNOSIS — G4733 Obstructive sleep apnea (adult) (pediatric): Secondary | ICD-10-CM | POA: Diagnosis not present

## 2021-09-07 DIAGNOSIS — K219 Gastro-esophageal reflux disease without esophagitis: Secondary | ICD-10-CM | POA: Diagnosis not present

## 2021-09-07 DIAGNOSIS — R053 Chronic cough: Secondary | ICD-10-CM | POA: Diagnosis not present

## 2021-09-07 DIAGNOSIS — R490 Dysphonia: Secondary | ICD-10-CM | POA: Diagnosis not present

## 2021-09-09 ENCOUNTER — Emergency Department: Payer: Medicare HMO

## 2021-09-09 ENCOUNTER — Inpatient Hospital Stay
Admission: EM | Admit: 2021-09-09 | Discharge: 2021-09-11 | DRG: 057 | Disposition: A | Discharge status: Home Health | Payer: Medicare HMO | Attending: Internal Medicine | Admitting: Internal Medicine

## 2021-09-09 ENCOUNTER — Inpatient Hospital Stay: Payer: Medicare HMO

## 2021-09-09 ENCOUNTER — Other Ambulatory Visit: Payer: Self-pay

## 2021-09-09 ENCOUNTER — Encounter: Payer: Self-pay | Admitting: Radiology

## 2021-09-09 DIAGNOSIS — K219 Gastro-esophageal reflux disease without esophagitis: Secondary | ICD-10-CM | POA: Diagnosis present

## 2021-09-09 DIAGNOSIS — I6523 Occlusion and stenosis of bilateral carotid arteries: Secondary | ICD-10-CM | POA: Diagnosis not present

## 2021-09-09 DIAGNOSIS — Z794 Long term (current) use of insulin: Secondary | ICD-10-CM

## 2021-09-09 DIAGNOSIS — R29898 Other symptoms and signs involving the musculoskeletal system: Secondary | ICD-10-CM

## 2021-09-09 DIAGNOSIS — M4312 Spondylolisthesis, cervical region: Secondary | ICD-10-CM | POA: Diagnosis not present

## 2021-09-09 DIAGNOSIS — I672 Cerebral atherosclerosis: Secondary | ICD-10-CM | POA: Diagnosis not present

## 2021-09-09 DIAGNOSIS — N3281 Overactive bladder: Secondary | ICD-10-CM | POA: Diagnosis present

## 2021-09-09 DIAGNOSIS — G8324 Monoplegia of upper limb affecting left nondominant side: Secondary | ICD-10-CM | POA: Diagnosis present

## 2021-09-09 DIAGNOSIS — J449 Chronic obstructive pulmonary disease, unspecified: Secondary | ICD-10-CM | POA: Diagnosis present

## 2021-09-09 DIAGNOSIS — Z87891 Personal history of nicotine dependence: Secondary | ICD-10-CM

## 2021-09-09 DIAGNOSIS — M1990 Unspecified osteoarthritis, unspecified site: Secondary | ICD-10-CM | POA: Diagnosis present

## 2021-09-09 DIAGNOSIS — E1142 Type 2 diabetes mellitus with diabetic polyneuropathy: Secondary | ICD-10-CM | POA: Diagnosis not present

## 2021-09-09 DIAGNOSIS — I2510 Atherosclerotic heart disease of native coronary artery without angina pectoris: Secondary | ICD-10-CM | POA: Diagnosis present

## 2021-09-09 DIAGNOSIS — G459 Transient cerebral ischemic attack, unspecified: Secondary | ICD-10-CM | POA: Diagnosis not present

## 2021-09-09 DIAGNOSIS — H53461 Homonymous bilateral field defects, right side: Secondary | ICD-10-CM | POA: Diagnosis not present

## 2021-09-09 DIAGNOSIS — I639 Cerebral infarction, unspecified: Secondary | ICD-10-CM | POA: Diagnosis not present

## 2021-09-09 DIAGNOSIS — I69351 Hemiplegia and hemiparesis following cerebral infarction affecting right dominant side: Secondary | ICD-10-CM | POA: Diagnosis not present

## 2021-09-09 DIAGNOSIS — Z20822 Contact with and (suspected) exposure to covid-19: Secondary | ICD-10-CM | POA: Diagnosis present

## 2021-09-09 DIAGNOSIS — E785 Hyperlipidemia, unspecified: Secondary | ICD-10-CM | POA: Diagnosis present

## 2021-09-09 DIAGNOSIS — R531 Weakness: Secondary | ICD-10-CM | POA: Diagnosis not present

## 2021-09-09 DIAGNOSIS — N400 Enlarged prostate without lower urinary tract symptoms: Secondary | ICD-10-CM | POA: Diagnosis present

## 2021-09-09 DIAGNOSIS — Z823 Family history of stroke: Secondary | ICD-10-CM

## 2021-09-09 DIAGNOSIS — I110 Hypertensive heart disease with heart failure: Secondary | ICD-10-CM | POA: Diagnosis present

## 2021-09-09 DIAGNOSIS — Z7984 Long term (current) use of oral hypoglycemic drugs: Secondary | ICD-10-CM

## 2021-09-09 DIAGNOSIS — Z6841 Body Mass Index (BMI) 40.0 and over, adult: Secondary | ICD-10-CM

## 2021-09-09 DIAGNOSIS — I6381 Other cerebral infarction due to occlusion or stenosis of small artery: Secondary | ICD-10-CM | POA: Diagnosis not present

## 2021-09-09 DIAGNOSIS — Z7982 Long term (current) use of aspirin: Secondary | ICD-10-CM

## 2021-09-09 DIAGNOSIS — Z79899 Other long term (current) drug therapy: Secondary | ICD-10-CM

## 2021-09-09 DIAGNOSIS — R29810 Facial weakness: Secondary | ICD-10-CM | POA: Diagnosis not present

## 2021-09-09 DIAGNOSIS — G4733 Obstructive sleep apnea (adult) (pediatric): Secondary | ICD-10-CM | POA: Diagnosis present

## 2021-09-09 DIAGNOSIS — I6503 Occlusion and stenosis of bilateral vertebral arteries: Secondary | ICD-10-CM | POA: Diagnosis not present

## 2021-09-09 DIAGNOSIS — Z885 Allergy status to narcotic agent status: Secondary | ICD-10-CM

## 2021-09-09 DIAGNOSIS — G319 Degenerative disease of nervous system, unspecified: Secondary | ICD-10-CM | POA: Diagnosis not present

## 2021-09-09 DIAGNOSIS — R2990 Unspecified symptoms and signs involving the nervous system: Secondary | ICD-10-CM | POA: Diagnosis present

## 2021-09-09 DIAGNOSIS — I5032 Chronic diastolic (congestive) heart failure: Secondary | ICD-10-CM | POA: Diagnosis present

## 2021-09-09 DIAGNOSIS — Z961 Presence of intraocular lens: Secondary | ICD-10-CM | POA: Diagnosis present

## 2021-09-09 DIAGNOSIS — I69398 Other sequelae of cerebral infarction: Principal | ICD-10-CM

## 2021-09-09 DIAGNOSIS — I10 Essential (primary) hypertension: Secondary | ICD-10-CM | POA: Diagnosis not present

## 2021-09-09 DIAGNOSIS — F32A Depression, unspecified: Secondary | ICD-10-CM | POA: Diagnosis present

## 2021-09-09 DIAGNOSIS — F03A0 Unspecified dementia, mild, without behavioral disturbance, psychotic disturbance, mood disturbance, and anxiety: Secondary | ICD-10-CM | POA: Diagnosis not present

## 2021-09-09 DIAGNOSIS — Z7902 Long term (current) use of antithrombotics/antiplatelets: Secondary | ICD-10-CM

## 2021-09-09 DIAGNOSIS — Z8546 Personal history of malignant neoplasm of prostate: Secondary | ICD-10-CM

## 2021-09-09 LAB — COMPREHENSIVE METABOLIC PANEL
ALT: 18 U/L (ref 0–44)
AST: 20 U/L (ref 15–41)
Albumin: 3.6 g/dL (ref 3.5–5.0)
Alkaline Phosphatase: 55 U/L (ref 38–126)
Anion gap: 6 (ref 5–15)
BUN: 13 mg/dL (ref 8–23)
CO2: 27 mmol/L (ref 22–32)
Calcium: 8.8 mg/dL — ABNORMAL LOW (ref 8.9–10.3)
Chloride: 104 mmol/L (ref 98–111)
Creatinine, Ser: 0.69 mg/dL (ref 0.61–1.24)
GFR, Estimated: >60 mL/min (ref >60)
Glucose, Bld: 137 mg/dL — ABNORMAL HIGH (ref 70–99)
Potassium: 4.5 mmol/L (ref 3.5–5.1)
Sodium: 137 mmol/L (ref 135–145)
Total Bilirubin: 0.7 mg/dL (ref 0.3–1.2)
Total Protein: 6.3 g/dL — ABNORMAL LOW (ref 6.5–8.1)

## 2021-09-09 LAB — DIFFERENTIAL
Abs Immature Granulocytes: 0.01 10*3/uL (ref 0.00–0.07)
Basophils Absolute: 0 10*3/uL (ref 0.0–0.1)
Basophils Relative: 1 %
Eosinophils Absolute: 0.1 10*3/uL (ref 0.0–0.5)
Eosinophils Relative: 3 %
Immature Granulocytes: 0 %
Lymphocytes Relative: 24 %
Lymphs Abs: 0.9 10*3/uL (ref 0.7–4.0)
Monocytes Absolute: 0.5 10*3/uL (ref 0.1–1.0)
Monocytes Relative: 12 %
Neutro Abs: 2.3 10*3/uL (ref 1.7–7.7)
Neutrophils Relative %: 60 %

## 2021-09-09 LAB — CBC
HCT: 34.9 % — ABNORMAL LOW (ref 39.0–52.0)
Hemoglobin: 10.9 g/dL — ABNORMAL LOW (ref 13.0–17.0)
MCH: 29.3 pg (ref 26.0–34.0)
MCHC: 31.2 g/dL (ref 30.0–36.0)
MCV: 93.8 fL (ref 80.0–100.0)
Platelets: 165 10*3/uL (ref 150–400)
RBC: 3.72 MIL/uL — ABNORMAL LOW (ref 4.22–5.81)
RDW: 15.7 % — ABNORMAL HIGH (ref 11.5–15.5)
WBC: 3.9 10*3/uL — ABNORMAL LOW (ref 4.0–10.5)
nRBC: 0 % (ref 0.0–0.2)

## 2021-09-09 LAB — PROTIME-INR
INR: 1 (ref 0.8–1.2)
Prothrombin Time: 13.3 seconds (ref 11.4–15.2)

## 2021-09-09 LAB — GLUCOSE, CAPILLARY
Glucose-Capillary: 121 mg/dL — ABNORMAL HIGH (ref 70–99)
Glucose-Capillary: 155 mg/dL — ABNORMAL HIGH (ref 70–99)

## 2021-09-09 LAB — SARS CORONAVIRUS 2 BY RT PCR: SARS Coronavirus 2 by RT PCR: NEGATIVE

## 2021-09-09 LAB — CBG MONITORING, ED: Glucose-Capillary: 123 mg/dL — ABNORMAL HIGH (ref 70–99)

## 2021-09-09 LAB — APTT: aPTT: 30 seconds (ref 24–36)

## 2021-09-09 LAB — ETHANOL: Alcohol, Ethyl (B): <10 mg/dL (ref <10)

## 2021-09-09 MED ORDER — SENNOSIDES-DOCUSATE SODIUM 8.6-50 MG PO TABS: 1.0000 | ORAL_TABLET | Freq: Every evening | ORAL | Status: DC | PRN

## 2021-09-09 MED ORDER — SALINE SPRAY 0.65 % NA SOLN
1.0000 | NASAL | Status: DC | PRN
Start: 2021-09-09 — End: 2021-09-11
  Administered 2021-09-09: 1 via NASAL
  Filled 2021-09-09: qty 44

## 2021-09-09 MED ORDER — CLOPIDOGREL BISULFATE 75 MG PO TABS
75.0000 mg | ORAL_TABLET | Freq: Every day | ORAL | Status: DC
  Administered 2021-09-09 – 2021-09-11 (×3): 75 mg via ORAL
  Filled 2021-09-09 (×3): qty 1

## 2021-09-09 MED ORDER — INSULIN ASPART 100 UNIT/ML IJ SOLN: 0.0000 [IU] | Freq: Every day | INTRAMUSCULAR | Status: DC

## 2021-09-09 MED ORDER — PANTOPRAZOLE SODIUM 40 MG PO TBEC
40.0000 mg | DELAYED_RELEASE_TABLET | Freq: Every day | ORAL | Status: DC
  Administered 2021-09-10 – 2021-09-11 (×2): 40 mg via ORAL
  Filled 2021-09-09 (×2): qty 1

## 2021-09-09 MED ORDER — ENOXAPARIN SODIUM 40 MG/0.4ML IJ SOSY: 40.0000 mg | PREFILLED_SYRINGE | INTRAMUSCULAR | Status: DC

## 2021-09-09 MED ORDER — ASPIRIN 300 MG RE SUPP
300.0000 mg | Freq: Every day | RECTAL | Status: DC
  Filled 2021-09-09 (×2): qty 1

## 2021-09-09 MED ORDER — ENOXAPARIN SODIUM 60 MG/0.6ML IJ SOSY
0.5000 mg/kg | PREFILLED_SYRINGE | INTRAMUSCULAR | Status: DC
Start: 2021-09-09 — End: 2021-09-11
  Administered 2021-09-09 – 2021-09-10 (×2): 57.5 mg via SUBCUTANEOUS
  Filled 2021-09-09 (×2): qty 0.6

## 2021-09-09 MED ORDER — DULOXETINE HCL 60 MG PO CPEP
60.0000 mg | ORAL_CAPSULE | Freq: Every day | ORAL | Status: DC
  Administered 2021-09-09 – 2021-09-10 (×2): 60 mg via ORAL
  Filled 2021-09-09 (×3): qty 1

## 2021-09-09 MED ORDER — ONDANSETRON HCL 4 MG/2ML IJ SOLN: 4.0000 mg | Freq: Four times a day (QID) | INTRAMUSCULAR | Status: DC | PRN

## 2021-09-09 MED ORDER — SODIUM CHLORIDE 0.9% FLUSH
3.0000 mL | Freq: Once | INTRAVENOUS | Status: AC
  Administered 2021-09-09: 3 mL via INTRAVENOUS

## 2021-09-09 MED ORDER — TAMSULOSIN HCL 0.4 MG PO CAPS
0.4000 mg | ORAL_CAPSULE | Freq: Every day | ORAL | Status: DC
  Administered 2021-09-09 – 2021-09-10 (×2): 0.4 mg via ORAL
  Filled 2021-09-09 (×2): qty 1

## 2021-09-09 MED ORDER — DONEPEZIL HCL 5 MG PO TABS
10.0000 mg | ORAL_TABLET | Freq: Every evening | ORAL | Status: DC
  Administered 2021-09-09 – 2021-09-10 (×2): 10 mg via ORAL
  Filled 2021-09-09 (×2): qty 2

## 2021-09-09 MED ORDER — INSULIN ASPART 100 UNIT/ML IJ SOLN
0.0000 [IU] | Freq: Three times a day (TID) | INTRAMUSCULAR | Status: DC
  Administered 2021-09-09: 1 [IU] via SUBCUTANEOUS
  Administered 2021-09-10 (×2): 2 [IU] via SUBCUTANEOUS
  Administered 2021-09-11: 1 [IU] via SUBCUTANEOUS
  Filled 2021-09-09 (×4): qty 1

## 2021-09-09 MED ORDER — ONDANSETRON HCL 4 MG PO TABS: 4.0000 mg | ORAL_TABLET | Freq: Four times a day (QID) | ORAL | Status: DC | PRN

## 2021-09-09 MED ORDER — ASPIRIN 81 MG PO CHEW
81.0000 mg | CHEWABLE_TABLET | Freq: Every day | ORAL | Status: DC
  Administered 2021-09-09 – 2021-09-11 (×3): 81 mg via ORAL
  Filled 2021-09-09 (×3): qty 1

## 2021-09-09 MED ORDER — ORAL CARE MOUTH RINSE
15.0000 mL | OROMUCOSAL | Status: DC | PRN
Start: 2021-09-09 — End: 2021-09-11

## 2021-09-09 MED ORDER — FINASTERIDE 5 MG PO TABS
5.0000 mg | ORAL_TABLET | Freq: Every day | ORAL | Status: DC
  Administered 2021-09-10 – 2021-09-11 (×2): 5 mg via ORAL
  Filled 2021-09-09 (×2): qty 1

## 2021-09-09 MED ORDER — STROKE: EARLY STAGES OF RECOVERY BOOK: Freq: Once | Status: AC

## 2021-09-09 MED ORDER — ATORVASTATIN CALCIUM 20 MG PO TABS
40.0000 mg | ORAL_TABLET | Freq: Every day | ORAL | Status: DC
  Administered 2021-09-10 – 2021-09-11 (×2): 40 mg via ORAL
  Filled 2021-09-09 (×2): qty 2

## 2021-09-09 MED ORDER — GABAPENTIN 100 MG PO CAPS
100.0000 mg | ORAL_CAPSULE | Freq: Every day | ORAL | Status: DC
  Administered 2021-09-09 – 2021-09-11 (×9): 100 mg via ORAL
  Filled 2021-09-09 (×9): qty 1

## 2021-09-09 MED ORDER — TRAZODONE HCL 50 MG PO TABS
50.0000 mg | ORAL_TABLET | Freq: Every evening | ORAL | Status: DC | PRN
  Administered 2021-09-10: 50 mg via ORAL
  Filled 2021-09-09: qty 1

## 2021-09-09 MED ORDER — IOHEXOL 350 MG/ML SOLN
75.0000 mL | Freq: Once | INTRAVENOUS | Status: AC | PRN
  Administered 2021-09-09: 75 mL via INTRAVENOUS

## 2021-09-09 NOTE — Progress Notes (Signed)
PT Cancellation Note  Patient Details Name: Taylor Austin MRN: [id number] DOB: May 19, 1940   Cancelled Treatment:    Reason Eval/Treat Not Completed: Other (comment) PT orders received, chart reviewed. Pt presented to the hospital ~1 hour ago & still awaiting formal medical POC to be noted in chart. Will hold PT evaluation at this time & await formal medical POC.  Taylor Austin, PT, DPT 09/09/21, 12:31 PM    [name] 09/09/2021, 12:30 PM

## 2021-09-09 NOTE — ED Notes (Signed)
Activated code Stroke W/[facility]

## 2021-09-09 NOTE — Plan of Care (Signed)

## 2021-09-09 NOTE — Progress Notes (Signed)
PHARMACIST - PHYSICIAN COMMUNICATION  CONCERNING:  Enoxaparin (Lovenox) for DVT Prophylaxis    RECOMMENDATION: Patient was prescribed enoxaprin '40mg'$  q24 hours for VTE prophylaxis.   Filed Weights   09/09/21 1142  Weight: 114 kg (251 lb 5.2 oz)    Body mass index is 41.82 kg/m.  Estimated Creatinine Clearance: 87.4 mL/min (by C-G formula based on SCr of 0.69 mg/dL).   Based on [place] patient is candidate for enoxaparin 0.'5mg'$ /kg TBW SQ every 24 hours based on BMI being >30.  DESCRIPTION: Pharmacy has adjusted enoxaparin dose per [facility] policy.  Patient is now receiving enoxaparin 57 mg every 24 hours    Aniylah Avans Rodriguez-Guzman PharmD, BCPS 09/09/2021 1:07 PM

## 2021-09-09 NOTE — ED Provider Notes (Signed)
[facility] Provider Note    Event Date/Time   First MD Initiated Contact with Patient 09/09/21 1149     (approximate)   History   Facial Droop   HPI  Taylor Austin is a 81 y.o. male with history of recent stroke who comes in with concerns for left-sided facial drooping and left arm weakness that started this morning.  Patient went to bed and was last normal last night around 10-11pm  He reports the symptoms seem getting better already at this time.  He reports that his mostly left arm weakness and a little bit of left facial droop.  He states that when he had his prior stroke it was more vision issues.  He denies any chest pain, shortness of breath, abdominal pain or other concerns.   Physical Exam   Triage Vital Signs: ED Triage Vitals  Enc Vitals Group     BP 09/09/21 1144 128/75     Pulse Rate 09/09/21 1144 76     Resp 09/09/21 1144 20     Temp 09/09/21 1144 98.1 F (36.7 C)     Temp Source 09/09/21 1144 Oral     SpO2 09/09/21 1144 91 %     Weight 09/09/21 1142 251 lb 5.2 oz (114 kg)     Height 09/09/21 1142 '5\' 5"'$  (1.651 m)     Head Circumference --      Peak Flow --      Pain Score 09/09/21 1142 0     Pain Loc --      Pain Edu? --      Excl. in [place]? --     Most recent vital signs: Vitals:   09/09/21 1144  BP: 128/75  Pulse: 76  Resp: 20  Temp: 98.1 F (36.7 C)  SpO2: 91%     General: Awake, no distress.  CV:  Good peripheral perfusion.  Resp:  Normal effort.  Abd:  No distention.  Soft and nontender Other:  Patient symptoms seem to be resolving.  He is got no left arm weakness at this time.  He is maybe a little bit of droop on the left.  Good strength in his legs.   ED Results / Procedures / Treatments   Labs (all labs ordered are listed, but only abnormal results are displayed) Labs Reviewed  CBC - Abnormal; Notable for the following components:      Result Value   WBC 3.9 (*)    RBC 3.72 (*)    Hemoglobin 10.9 (*)     HCT 34.9 (*)    RDW 15.7 (*)    All other components within normal limits  COMPREHENSIVE METABOLIC PANEL - Abnormal; Notable for the following components:   Glucose, Bld 137 (*)    Calcium 8.8 (*)    Total Protein 6.3 (*)    All other components within normal limits  CBG MONITORING, ED - Abnormal; Notable for the following components:   Glucose-Capillary 123 (*)    All other components within normal limits  PROTIME-INR  APTT  DIFFERENTIAL  ETHANOL  CBG MONITORING, ED  I-STAT CREATININE, ED     EKG  My interpretation of EKG:  Sinus rate of 70 without any ST elevation or T wave inversions with occasional PVC, normal intervals  RADIOLOGY I have reviewed the CT head personally interpreted and left PCA infarct chronic but otherwise no intracranial hemorrhage   PROCEDURES:  Critical Care performed: Yes, see critical care procedure note(s)  .1-3  Lead EKG Interpretation  Performed by: Vanessa Loyal, MD Authorized by: Vanessa Northbrook, MD     Interpretation: normal     ECG rate:  70   ECG rate assessment: normal     Rhythm: sinus rhythm     Ectopy: PVCs     Conduction: normal   .Critical Care  Performed by: Vanessa Breese, MD Authorized by: Vanessa Ashley, MD   Critical care provider statement:    Critical care time (minutes):  30   Critical care was necessary to treat or prevent imminent or life-threatening deterioration of the following conditions:  CNS failure or compromise   Critical care was time spent personally by me on the following activities:  Development of treatment plan with patient or surrogate, discussions with consultants, evaluation of patient's response to treatment, examination of patient, ordering and review of laboratory studies, ordering and review of radiographic studies, ordering and performing treatments and interventions, pulse oximetry, re-evaluation of patient's condition and review of old charts    MEDICATIONS ORDERED IN ED: Medications   sodium chloride flush (NS) 0.9 % injection 3 mL (has no administration in time range)   stroke: early stages of recovery book (has no administration in time range)  aspirin chewable tablet 81 mg (has no administration in time range)    Or  aspirin suppository 300 mg (has no administration in time range)  clopidogrel (PLAVIX) tablet 75 mg (has no administration in time range)     IMPRESSION / MDM / ASSESSMENT AND PLAN / ED COURSE  I reviewed the triage vital signs and the nursing notes.   Patient's presentation is most consistent with acute presentation with potential threat to life or bodily function.   Patient comes in with new neurological deficits from prior stroke with last known normal of yesterday.  Stroke code called from triage but patient is on blood thinner and greater than 4.5 hours therefore out of the window for TNK.  Denies any other symptoms to suggest UTI, dissection.  Slightly low saturation noted in triage but on recheck it was 98% he denied any chest pain or shortness of breath  EtOH negative coags normal hemoglobin slightly lower than baseline white count a little bit low but no other infectious symptoms.  CMP reassuring.  Discussed the case with Dr. Welford Roche from neurology who recommended admission for stroke work-up.   The patient is on the cardiac monitor to evaluate for evidence of arrhythmia and/or significant heart rate changes.      FINAL CLINICAL IMPRESSION(S) / ED DIAGNOSES   Final diagnoses:  TIA (transient ischemic attack)     Rx / DC Orders   ED Discharge Orders     None        Note:  This document was prepared using Dragon voice recognition software and may include unintentional dictation errors.   Vanessa [place], MD 09/09/21 [phone]

## 2021-09-09 NOTE — Progress Notes (Signed)
Chaplain responded to code stroke. Chaplain met with patient and his wife. They were welcoming to Chaplain's visit but, pt was anxious to leave. Chaplain offered compassionate presence and active listening to Hutch's frustrations. Both were grateful for Chaplain's visit. Please call Chaplain if needed.

## 2021-09-09 NOTE — ED Notes (Signed)
Patient transported to MRI 

## 2021-09-09 NOTE — ED Triage Notes (Signed)
Pt reports woke up this am with left sided facial drooping and left arm weakness.

## 2021-09-09 NOTE — ED Notes (Signed)
Neurologist at bedside. 

## 2021-09-09 NOTE — Consult Note (Signed)
NEUROLOGY CONSULTATION NOTE   Date of service: September 09, 2021 Patient Name: Taylor Austin MRN:  [id number] DOB:  March 29, 1940 Reason for consult: stroke code Requesting physician: Dr. Marjean Donna _ _ _   _ __   _ __ _ _  __ __   _ __   __ _  History of Present Illness   This is a 81 yo man with hx mild dementia, HTN, HL, L occipital ischemic stroke one month ago after which he got TNK and had residual [facility] who presents after awakening this AM with new L sided facial droop and mild LUE weakness. LKW before bed last night. Not a TNK candidate 2/2 stroke last month. CT head NAICP (personal review). Exam not c/w LVO therefore CTA not performed as part of the stroke code.  ROS   Per HPI: all other systems reviewed and are negative  Past History   I have reviewed the following:  Past Medical History:  Diagnosis Date   Allergy    Anemia    Arthritis    Back pain    Barrett esophagus    BPH (benign prostatic hyperplasia)    Chronic kidney disease    COPD (chronic obstructive pulmonary disease) (HCC)    Coronary artery disease    Depression    Diabetes mellitus without complication (HCC)    Diverticulitis    Dysphagia    Esophageal reflux    Esophageal reflux    GERD (gastroesophageal reflux disease)    Heart murmur    Hyperlipidemia    Hypersomnia    Hypertension    Kidney stones    Low testosterone    OAB (overactive bladder)    Obesity    Presence of dental bridge    2 - top   Rheumatic fever    Sleep apnea    BiPAP   Stroke ([place]) 01/2016   Vertigo    Vitamin B12 deficiency    Past Surgical History:  Procedure Laterality Date   BACK SURGERY     CARDIAC CATHETERIZATION  08/11/2014   Procedure: Left Heart Cath and Coronary Angiography;  Surgeon: Teodoro Spray, MD;  Location: [place] [facility];  Service: Cardiovascular;;   CARDIAC CATHETERIZATION N/A 11/17/2014   Procedure: Right Heart Cath;  Surgeon: Teodoro Spray, MD;  Location: [place] [facility];   Service: Cardiovascular;  Laterality: N/A;   CAROTID STENT     CATARACT EXTRACTION W/PHACO Left 04/03/2016   Procedure: CATARACT EXTRACTION PHACO AND INTRAOCULAR LENS PLACEMENT ([place]) left diabetic;  Surgeon: Eulogio Bear, MD;  Location: [place];  Service: Ophthalmology;  Laterality: Left;  diabetic - oral meds sleep apnea   CATARACT EXTRACTION W/PHACO Right 05/01/2016   Procedure: CATARACT EXTRACTION PHACO AND INTRAOCULAR LENS PLACEMENT ([place]) Right diabetic;  Surgeon: Eulogio Bear, MD;  Location: [place];  Service: Ophthalmology;  Laterality: Right;  Diabetic oral meds sleep apnea   CERVICAL SPINE SURGERY     COLONOSCOPY     COLONOSCOPY WITH PROPOFOL N/A 06/10/2015   Procedure: COLONOSCOPY WITH PROPOFOL;  Surgeon: Manya Silvas, MD;  Location: [facility] ENDOSCOPY;  Service: Endoscopy;  Laterality: N/A;   CORONARY ANGIOPLASTY     ESOPHAGOGASTRODUODENOSCOPY (EGD) WITH PROPOFOL N/A 06/28/2017   Procedure: ESOPHAGOGASTRODUODENOSCOPY (EGD) WITH PROPOFOL;  Surgeon: Manya Silvas, MD;  Location: [facility] ENDOSCOPY;  Service: Endoscopy;  Laterality: N/A;   ESOPHAGOGASTRODUODENOSCOPY ENDOSCOPY     EYE SURGERY     LUMBAR SPINE SURGERY  THORACIC SPINE SURGERY     TONSILLECTOMY     Family History  Problem Relation Age of Onset   Stroke Father    Anesthesia problems Father    Anesthesia problems Mother    Prostate cancer Brother    Kidney disease Neg Hx    Bladder Cancer Neg Hx    Social History   Socioeconomic History   Marital status: Married    Spouse name: Not on file   Number of children: Not on file   Years of education: Not on file   Highest education level: Not on file  Occupational History   Not on file  Tobacco Use   Smoking status: Former    Types: Cigarettes    Quit date: 07/13/1973    Years since quitting: 48.1   Smokeless tobacco: Never   Tobacco comments:    reports he smoked 4 packs a day for 10 years and quit in 1975  Vaping Use    Vaping Use: Never used  Substance and Sexual Activity   Alcohol use: Yes    Alcohol/week: 1.0 standard drink of alcohol    Types: 1 Standard drinks or equivalent per week    Comment: rarely drinks, for holidays   Drug use: No   Sexual activity: Not on file  Other Topics Concern   Not on file  Social History Narrative   Not on file   Social Determinants of Health   Financial Resource Strain: Not on file  Food Insecurity: Not on file  Transportation Needs: Not on file  Physical Activity: Not on file  Stress: Not on file  Social Connections: Not on file   Allergies  Allergen Reactions   Hydrocodone Shortness Of Breath   Oxycodone-Acetaminophen Shortness Of Breath and Swelling    Confirmed with wife- tongue swelling and SOB after oxycodone    Medications   (Not in a hospital admission)     Current Facility-Administered Medications:    [START ON 09/10/2021]  stroke: early stages of recovery book, , Does not apply, Once, Derek Jack, MD   aspirin chewable tablet 81 mg, 81 mg, Oral, Daily, 81 mg at 09/09/21 1225 **OR** aspirin suppository 300 mg, 300 mg, Rectal, Daily, Derek Jack, MD   clopidogrel (PLAVIX) tablet 75 mg, 75 mg, Oral, Daily, Derek Jack, MD, 75 mg at 09/09/21 1225  Current Outpatient Medications:    aspirin EC 81 MG tablet, Take 81 mg by mouth every evening., Disp: , Rfl:    aspirin-acetaminophen-caffeine (EXCEDRIN MIGRAINE) 250-250-65 MG tablet, Take 1-2 tablets by mouth every 6 (six) hours as needed for headache., Disp: , Rfl:    atorvastatin (LIPITOR) 80 MG tablet, Take 0.5 tablets (40 mg total) by mouth every evening., Disp: 30 tablet, Rfl: 2   calcium carbonate (OSCAL) 1500 (600 Ca) MG TABS tablet, Take 600 mg of elemental calcium by mouth daily with lunch., Disp: , Rfl:    clopidogrel (PLAVIX) 75 MG tablet, Take 1 tablet (75 mg total) by mouth daily., Disp: 30 tablet, Rfl: 0   donepezil (ARICEPT) 10 MG tablet, Take 10 mg by mouth every  evening., Disp: , Rfl:    DULoxetine (CYMBALTA) 60 MG capsule, Take 60 mg by mouth daily with supper., Disp: , Rfl:    finasteride (PROSCAR) 5 MG tablet, TAKE 1 TABLET EVERY DAY, Disp: 90 tablet, Rfl: 3   gabapentin (NEURONTIN) 100 MG capsule, Take 100 mg by mouth 5 (five) times daily., Disp: , Rfl:    glimepiride (AMARYL)  4 MG tablet, Take 4 mg by mouth in the morning., Disp: , Rfl:    Insulin Degludec-Liraglutide (XULTOPHY) 100-3.6 UNIT-MG/ML SOPN, Inject 50 Units into the skin daily. (Only use if blood glucose is >70), Disp: , Rfl:    losartan (COZAAR) 100 MG tablet, Take 50 mg by mouth daily., Disp: , Rfl:    magnesium oxide (MAG-OX) 400 MG tablet, Take 400 mg by mouth daily with lunch., Disp: , Rfl:    metFORMIN (GLUCOPHAGE-XR) 500 MG 24 hr tablet, Take 1,000 mg by mouth 2 (two) times daily with a meal., Disp: , Rfl:    Multiple Vitamin (MULTI-VITAMINS) TABS, Take 1 tablet by mouth daily with lunch., Disp: , Rfl:    oxymetazoline (AFRIN) 0.05 % nasal spray, Place 1 spray into both nostrils 2 (two) times daily as needed for congestion., Disp: , Rfl:    pantoprazole (PROTONIX) 40 MG tablet, Take 40 mg by mouth daily before breakfast., Disp: , Rfl:    pioglitazone (ACTOS) 15 MG tablet, Take 45 mg by mouth every evening., Disp: , Rfl:    sildenafil (REVATIO) 20 MG tablet, 1-5 tablets as needed one hour prior to intercourse, Disp: 30 tablet, Rfl: 3   tamsulosin (FLOMAX) 0.4 MG CAPS capsule, Take 1 capsule (0.4 mg total) by mouth daily after supper., Disp: 90 capsule, Rfl: 3   Tiotropium Bromide Monohydrate 2.5 MCG/ACT AERS, Inhale 2 each into the lungs at bedtime., Disp: , Rfl:    vitamin B-12 (CYANOCOBALAMIN) 1000 MCG tablet, Take 1,000 mcg by mouth daily with lunch., Disp: , Rfl:   Vitals   Vitals:   09/09/21 1142 09/09/21 1144 09/09/21 1241  BP:  128/75   Pulse:  76 69  Resp:  20 16  Temp:  98.1 F (36.7 C)   TempSrc:  Oral   SpO2:  91% 98%  Weight: 114 kg    Height: '5\' 5"'$  (1.651 m)        Body mass index is 41.82 kg/m.  Physical Exam   Physical Exam Gen: A&O x4, NAD HEENT: Atraumatic, normocephalic;mucous membranes moist; oropharynx clear, tongue without atrophy or fasciculations. Neck: Supple, trachea midline. Resp: CTAB, no w/r/r CV: RRR, no m/g/r; nml S1 and S2. 2+ symmetric peripheral pulses. Abd: soft/NT/ND; nabs x 4 quad Extrem: Nml bulk; no cyanosis, clubbing, or edema.  Neuro: *MS: A&O x4. Follows multi-step commands.  *Speech: fluid, nondysarthric, able to name and repeat *CN:    I: Deferred   II,III: PERRLA, RHH, optic discs unable to be visualized 2/2 pupillary constriction   III,IV,VI: EOMI w/o nystagmus, no ptosis   V: Sensation intact from V1 to V3 to LT   VII: Eyelid closure was full.  L UMN facial droop   VIII: Hearing intact to voice   IX,X: Voice normal, palate elevates symmetrically    XI: SCM/trap 5/5 bilat   XII: Tongue protrudes midline, no atrophy or fasciculations  *Motor:   Normal bulk.  No tremor, rigidity or bradykinesia. No pronator drift. Mild drift LUE, all other extremities full strength. *Sensory: SILT. No double-simultaneous extinction.  *Coordination:  FNF intact bilat *Reflexes:  2+ and symmetric throughout without clonus; toes down-going bilat *Gait: deferred  NIHSS  1a Level of Conscious.: 0 1b LOC Questions: 0 1c LOC Commands: 0 2 Best Gaze: 0 3 Visual: 1 4 Facial Palsy: 1 5a Motor Arm - left: 1 5b Motor Arm - Right: 0 6a Motor Leg - Left: 0 6b Motor Leg - Right: 0 7 Limb Ataxia: 0 8  Sensory: 0 9 Best Language: 0 10 Dysarthria: 0 11 Extinct. and Inatten.: 0  TOTAL: 3  Premorbid mRS = 1   Labs   CBC:  Recent Labs  Lab 09/09/21 1140  WBC 3.9*  NEUTROABS 2.3  HGB 10.9*  HCT 34.9*  MCV 93.8  PLT 827    Basic Metabolic Panel:  Lab Results  Component Value Date   NA 137 09/09/2021   K 4.5 09/09/2021   CO2 27 09/09/2021   GLUCOSE 137 (H) 09/09/2021   BUN 13 09/09/2021   CREATININE 0.69  09/09/2021   CALCIUM 8.8 (L) 09/09/2021   GFRNONAA >60 09/09/2021   GFRAA >60 02/02/2016   Lipid Panel:  Lab Results  Component Value Date   LDLCALC 69 07/24/2021   HgbA1c:  Lab Results  Component Value Date   HGBA1C 6.6 (H) 07/24/2021   Urine Drug Screen: No results found for: "LABOPIA", "COCAINSCRNUR", "LABBENZ", "AMPHETMU", "THCU", "LABBARB"  Alcohol Level     Component Value Date/Time   ETH <10 09/09/2021 1140     Impression   This is a 81 yo man with hx mild dementia, HTN, HL, L occipital ischemic stroke one month ago after which he got TNK and had residual [facility] who presents after awakening this AM with new L sided facial droop and mild LUE weakness c/f recurrent ischemic event.  Recommendations   - Admit for stroke workup - Permissive HTN x48 hrs from sx onset or until stroke ruled out by MRI goal BP <220/110. PRN labetalol or hydralazine if BP above these parameters. Avoid oral antihypertensives. - MRI brain wo contrast - CTA or MRA H&N - no indication to repeat TTE from 1 mo ago - Continue atorvastatin '80mg'$  daily - Continue home aspirin '81mg'$  daily + plavix '75mg'$  daily - q4 hr neuro checks - STAT head CT for any change in neuro exam - Tele - PT/OT/SLP - Stroke education - Ambulatory cardiac monitor prior to discharge - Amb referral to neurology upon discharge   Will continue to follow  ______________________________________________________________________   Thank you for the opportunity to take part in the care of this patient. If you have any further questions, please contact the neurology consultation attending.  Signed,  Su Monks, MD [facility] [phone]  If 7pm- 7am, please page neurology on call as listed in [place].

## 2021-09-09 NOTE — Progress Notes (Signed)
Code stroke activated @ 1150.  Pt in CT at activation time.  Stack paged @ 1154.  Spoke with Dr. Quinn Axe by phone @ [phone].  CVA in July, not a TNK candidate.  Off cart @ 1200.

## 2021-09-09 NOTE — H&P (Signed)
History and Physical    Patient: Taylor Austin:[id number] DOB: 16-Mar-1940 DOA: 09/09/2021 DOS: the patient was seen and examined on 09/09/2021 PCP: Juluis Pitch, MD  Patient coming from: Home  Chief Complaint:  Chief Complaint  Patient presents with   Facial Droop   HPI: Taylor Austin is a 81 y.o. male with medical history significant for Chronic Mild Dementia (follows with Dr. Manuella Ghazi), Hypertension, Hyperlipidemia, Diabetes Mellitus Type 2, 07/24/2021 Left Occipital Stroke s /p Tenecteplase with residual right sided deficits, and history of 2018 stroke, and OSA (on CPAP nightly) who presents to the ED after waking up this morning with a new left sided facial droop and mild left upper extremity weakness.  Patient states "I am a little more weak than my usual."  His last known normal was yesterday evening.  Neurology evaluated patient and deemed him to not be a TPA candidate.  Mr. Moffatt recently completed 6 weeks of physical therapy.  His wife states that patient takes his Aspirin and Plavix every day and does not miss a dose.  He denies any fevers, chills, chest pain, shortness of breath, nausea, vomiting, diarrhea, abdominal pain, or urinary symptoms.  Review of Systems: As mentioned in the history of present illness. All other systems reviewed and are negative. Past Medical History:  Diagnosis Date   Allergy    Anemia    Arthritis    Back pain    Barrett esophagus    BPH (benign prostatic hyperplasia)    Chronic kidney disease    COPD (chronic obstructive pulmonary disease) (HCC)    Coronary artery disease    Depression    Diabetes mellitus without complication (HCC)    Diverticulitis    Dysphagia    Esophageal reflux    Esophageal reflux    GERD (gastroesophageal reflux disease)    Heart murmur    Hyperlipidemia    Hypersomnia    Hypertension    Kidney stones    Low testosterone    OAB (overactive bladder)    Obesity    Presence of dental bridge    2 - top    Rheumatic fever    Sleep apnea    BiPAP   Stroke ([place]) 01/2016   Vertigo    Vitamin B12 deficiency    Past Surgical History:  Procedure Laterality Date   BACK SURGERY     CARDIAC CATHETERIZATION  08/11/2014   Procedure: Left Heart Cath and Coronary Angiography;  Surgeon: Teodoro Spray, MD;  Location: [facility];  Service: Cardiovascular;;   CARDIAC CATHETERIZATION N/A 11/17/2014   Procedure: Right Heart Cath;  Surgeon: Teodoro Spray, MD;  Location: [facility];  Service: Cardiovascular;  Laterality: N/A;   CAROTID STENT     CATARACT EXTRACTION W/PHACO Left 04/03/2016   Procedure: CATARACT EXTRACTION PHACO AND INTRAOCULAR LENS PLACEMENT ([place]) left diabetic;  Surgeon: Eulogio Bear, MD;  Location: [facility];  Service: Ophthalmology;  Laterality: Left;  diabetic - oral meds sleep apnea   CATARACT EXTRACTION W/PHACO Right 05/01/2016   Procedure: CATARACT EXTRACTION PHACO AND INTRAOCULAR LENS PLACEMENT ([place]) Right diabetic;  Surgeon: Eulogio Bear, MD;  Location: [facility];  Service: Ophthalmology;  Laterality: Right;  Diabetic oral meds sleep apnea   CERVICAL SPINE SURGERY     COLONOSCOPY     COLONOSCOPY WITH PROPOFOL N/A 06/10/2015   Procedure: COLONOSCOPY WITH PROPOFOL;  Surgeon: Manya Silvas, MD;  Location: [facility] ENDOSCOPY;  Service: Endoscopy;  Laterality:  N/A;   CORONARY ANGIOPLASTY     ESOPHAGOGASTRODUODENOSCOPY (EGD) WITH PROPOFOL N/A 06/28/2017   Procedure: ESOPHAGOGASTRODUODENOSCOPY (EGD) WITH PROPOFOL;  Surgeon: Manya Silvas, MD;  Location: [facility] ENDOSCOPY;  Service: Endoscopy;  Laterality: N/A;   ESOPHAGOGASTRODUODENOSCOPY ENDOSCOPY     EYE SURGERY     LUMBAR SPINE SURGERY     THORACIC SPINE SURGERY     TONSILLECTOMY     Social History:  reports that he quit smoking about 48 years ago. His smoking use included cigarettes. He has never used smokeless tobacco. He reports current alcohol use of about 1.0 standard drink of alcohol  per week. He reports that he does not use drugs.  Allergies  Allergen Reactions   Hydrocodone Shortness Of Breath   Oxycodone-Acetaminophen Shortness Of Breath and Swelling    Confirmed with wife- tongue swelling and SOB after oxycodone    Family History  Problem Relation Age of Onset   Stroke Father    Anesthesia problems Father    Anesthesia problems Mother    Prostate cancer Brother    Kidney disease Neg Hx    Bladder Cancer Neg Hx     Prior to Admission medications   Medication Sig Start Date End Date Taking? Authorizing Provider  aspirin EC 81 MG tablet Take 81 mg by mouth every evening.    [provider]  aspirin-acetaminophen-caffeine (EXCEDRIN MIGRAINE) [phone] MG tablet Take 1-2 tablets by mouth every 6 (six) hours as needed for headache.    [provider]  atorvastatin (LIPITOR) 80 MG tablet Take 0.5 tablets (40 mg total) by mouth every evening. 07/26/21   Annita Brod, MD  calcium carbonate (OSCAL) 1500 (600 Ca) MG TABS tablet Take 600 mg of elemental calcium by mouth daily with lunch.    [provider]  clopidogrel (PLAVIX) 75 MG tablet Take 1 tablet (75 mg total) by mouth daily. 02/01/16   Hillary Bow, MD  donepezil (ARICEPT) 10 MG tablet Take 10 mg by mouth every evening.    [provider]  DULoxetine (CYMBALTA) 60 MG capsule Take 60 mg by mouth daily with supper.    [provider]  finasteride (PROSCAR) 5 MG tablet TAKE 1 TABLET EVERY DAY 03/27/21   Hollice Espy, MD  gabapentin (NEURONTIN) 100 MG capsule Take 100 mg by mouth 5 (five) times daily.    [provider]  glimepiride (AMARYL) 4 MG tablet Take 4 mg by mouth in the morning.    [provider]  Insulin Degludec-Liraglutide (XULTOPHY) 100-3.6 UNIT-MG/ML SOPN Inject 50 Units into the skin daily. (Only use if blood glucose is >70)    [provider]  losartan (COZAAR) 100 MG tablet Take 50 mg by mouth daily.    [provider]  magnesium oxide (MAG-OX) 400 MG tablet Take 400 mg by mouth daily with lunch.    [provider]  metFORMIN (GLUCOPHAGE-XR) 500 MG 24 hr tablet Take 1,000 mg by mouth 2 (two) times daily with a meal.    [provider]  Multiple Vitamin (MULTI-VITAMINS) TABS Take 1 tablet by mouth daily with lunch.    [provider]  oxymetazoline (AFRIN) 0.05 % nasal spray Place 1 spray into both nostrils 2 (two) times daily as needed for congestion.    [provider]  pantoprazole (PROTONIX) 40 MG tablet Take 40 mg by mouth daily before breakfast.    [provider]  pioglitazone (ACTOS) 15 MG tablet Take 45 mg by mouth every evening.  [provider]  sildenafil (REVATIO) 20 MG tablet 1-5 tablets as needed one hour prior to intercourse 04/26/20   Hollice Espy, MD  tamsulosin (FLOMAX) 0.4 MG CAPS capsule Take 1 capsule (0.4 mg total) by mouth daily after supper. 01/30/18   Noreene Filbert, MD  Tiotropium Bromide Monohydrate 2.5 MCG/ACT AERS Inhale 2 each into the lungs at bedtime.    [provider]  vitamin B-12 (CYANOCOBALAMIN) 1000 MCG tablet Take 1,000 mcg by mouth daily with lunch.    [provider]    Physical Exam: Vitals:   09/09/21 1142 09/09/21 1144 09/09/21 1241  BP:  128/75   Pulse:  76 69  Resp:  20 16  Temp:  98.1 F (36.7 C)   TempSrc:  Oral   SpO2:  91% 98%  Weight: 114 kg    Height: '5\' 5"'$  (1.651 m)     Examination: General exam: chronically ill appearing lying in bed HEENT: NCAT, PERRL Respiratory system: CTAB no WRR Cardiovascular system: Did not appreciate a murmur, regular, No JVD. Gastrointestinal system: No flank pain, Abdomen soft, NT,ND, BS+. Nervous System: Left sided facial droop, mild LUE weakness, chronic right sided deficits Extremities: No edema, distal peripheral pulses palpable.  Skin: No rashes MSK: Physical Deconditioning  Data Reviewed:  CT HEAD CODE STROKE WO  CONTRAST Addendum: ADDENDUM REPORT: 09/09/2021 12:15   ADDENDUM:  Study discussed by telephone with Dr. Marjean Donna on 09/09/2021 at 1203  hours.   Electronically Signed    By: Genevie Ann M.D.    On: 09/09/2021 12:15 Narrative: CLINICAL DATA:  Code stroke. 81 year old male with left side weakness. Status post left PCA territory infarct in July.  EXAM: CT HEAD WITHOUT CONTRAST  TECHNIQUE: Contiguous axial images were obtained from the base of the skull through the vertex without intravenous contrast.  RADIATION DOSE REDUCTION: This exam was performed according to the departmental dose-optimization program which includes automated exposure control, adjustment of the mA and/or kV according to patient size and/or use of iterative reconstruction technique.  COMPARISON:  Brain MRI and head CT 07/24/2021.  FINDINGS: Brain: Expected evolution of patchy left PCA territory encephalomalacia since July. Superimposed asymmetric cystic areas of cerebral white matter encephalomalacia more pronounced in the left hemisphere, also stable.  No midline shift, ventriculomegaly, mass effect, evidence of mass lesion, intracranial hemorrhage or evidence of cortically based acute infarction.  Vascular: Calcified atherosclerosis at the skull base. No suspicious intracranial vascular hyperdensity.  Skull: No acute osseous abnormality identified.  Sinuses/Orbits: Visualized paranasal sinuses and mastoids are stable and well aerated.  Other: No acute orbit or scalp soft tissue finding.  ASPECTS [facility] Stroke Program Early CT Score)  Total score (0-10 with 10 being normal): 10  IMPRESSION: 1. No acute cortically based infarct or intracranial hemorrhage identified. ASPECTS 10. 2. Expected evolution of the Left PCA territory infarct since July. Chronic white matter disease.  Electronically Signed: By: Genevie Ann M.D. On: 09/09/2021 12:00  Lab Results  Component Value Date   WBC 3.9 (L)  09/09/2021   HGB 10.9 (L) 09/09/2021   HCT 34.9 (L) 09/09/2021   MCV 93.8 09/09/2021   PLT 165 93/71/6967   Last metabolic panel Lab Results  Component Value Date   GLUCOSE 137 (H) 09/09/2021   NA 137 09/09/2021   K 4.5 09/09/2021   CL 104 09/09/2021   CO2 27 09/09/2021   BUN 13 09/09/2021   CREATININE 0.69 09/09/2021   GFRNONAA >60 09/09/2021   CALCIUM 8.8 (L) 09/09/2021  PHOS 4.3 07/26/2021   PROT 6.3 (L) 09/09/2021   ALBUMIN 3.6 09/09/2021   BILITOT 0.7 09/09/2021   ALKPHOS 55 09/09/2021   AST 20 09/09/2021   ALT 18 09/09/2021   ANIONGAP 6 09/09/2021     Assessment and Plan: Acute Stroke Like Symptoms s/p 07/24/2021 Left Occipital Stroke with Right Sided Deficits: Patient presents with new mild left sided facial droop and left upper extremity weakness.   - Continue Aspirin, Plavix, and Statin. - 07/2021 TTE reviewed.  There is no need to reorder another Echo. - Head CT and CTA showed no abnormality.  Check MRI of the brain. - Monitor on telemetry. - Monitor temp, BP, and glucose. - Consult PT/OT/SLP. - Follow up with Neurology outpatient.  Chronic Dementia: Mental status is at baseline. - Continue home Donepezil 10 mg nightly.  Hypertension: BP is stable. - Hold BP meds for now to allow permissive hypertension.  Hyperlipidemia: - Atorvastatin 40 mg daily.  Diabetes Mellitus Type 2: - Start Lispro Ssi with POC glucose ACHS. - Discontinue home glitazone as patient has diastolic heart failure.  Grade 1 Diastolic Heart Failure: - Patient is euvolemic.  GERD: - PPI.  BPH / History of Prostate Cancer: - Tamsulosin and Finasteride.  Peripheral Neuropathy / Lumbosacral Radiculopathy at S1 / DDD: - Duloxetine 60 mg daily.  OSA - CPAP nightly.   Advance Care Planning:   Code Status: Full Code   Consults: Neurology  Family Communication: I spoke with wife at bedside.  Severity of Illness: The appropriate patient status for this patient is INPATIENT.  Inpatient status is judged to be reasonable and necessary in order to provide the required intensity of service to ensure the patient's safety. The patient's presenting symptoms, physical exam findings, and initial radiographic and laboratory data in the context of their chronic comorbidities is felt to place them at high risk for further clinical deterioration. Furthermore, it is not anticipated that the patient will be medically stable for discharge from the hospital within 2 midnights of admission.   * I certify that at the point of admission it is my clinical judgment that the patient will require inpatient hospital care spanning beyond 2 midnights from the point of admission due to high intensity of service, high risk for further deterioration and high frequency of surveillance required.*  Author: George Hugh, MD 09/09/2021 1:04 PM  For on call review [email or web address].

## 2021-09-10 LAB — BASIC METABOLIC PANEL
Anion gap: 9 (ref 5–15)
BUN: 12 mg/dL (ref 8–23)
CO2: 25 mmol/L (ref 22–32)
Calcium: 8.7 mg/dL — ABNORMAL LOW (ref 8.9–10.3)
Chloride: 100 mmol/L (ref 98–111)
Creatinine, Ser: 0.75 mg/dL (ref 0.61–1.24)
GFR, Estimated: >60 mL/min (ref >60)
Glucose, Bld: 124 mg/dL — ABNORMAL HIGH (ref 70–99)
Potassium: 3.9 mmol/L (ref 3.5–5.1)
Sodium: 134 mmol/L — ABNORMAL LOW (ref 135–145)

## 2021-09-10 LAB — CBC
HCT: 32.9 % — ABNORMAL LOW (ref 39.0–52.0)
Hemoglobin: 10.6 g/dL — ABNORMAL LOW (ref 13.0–17.0)
MCH: 29.4 pg (ref 26.0–34.0)
MCHC: 32.2 g/dL (ref 30.0–36.0)
MCV: 91.1 fL (ref 80.0–100.0)
Platelets: 155 10*3/uL (ref 150–400)
RBC: 3.61 MIL/uL — ABNORMAL LOW (ref 4.22–5.81)
RDW: 15.4 % (ref 11.5–15.5)
WBC: 4.3 10*3/uL (ref 4.0–10.5)
nRBC: 0 % (ref 0.0–0.2)

## 2021-09-10 LAB — LIPID PANEL
Cholesterol: 128 mg/dL (ref 0–200)
HDL: 45 mg/dL (ref >40)
LDL Cholesterol: 51 mg/dL (ref 0–99)
Total CHOL/HDL Ratio: 2.8 RATIO
Triglycerides: 162 mg/dL — ABNORMAL HIGH (ref <150)
VLDL: 32 mg/dL (ref 0–40)

## 2021-09-10 LAB — GLUCOSE, CAPILLARY
Glucose-Capillary: 156 mg/dL — ABNORMAL HIGH (ref 70–99)
Glucose-Capillary: 181 mg/dL — ABNORMAL HIGH (ref 70–99)
Glucose-Capillary: 115 mg/dL — ABNORMAL HIGH (ref 70–99)
Glucose-Capillary: 183 mg/dL — ABNORMAL HIGH (ref 70–99)

## 2021-09-10 MED ORDER — PROMETHAZINE HCL 25 MG PO TABS
12.5000 mg | ORAL_TABLET | Freq: Once | ORAL | Status: AC
Start: 2021-09-10 — End: 2021-09-10
  Administered 2021-09-10: 12.5 mg via ORAL
  Filled 2021-09-10: qty 1

## 2021-09-10 MED ORDER — KETOROLAC TROMETHAMINE 15 MG/ML IJ SOLN
15.0000 mg | Freq: Once | INTRAMUSCULAR | Status: AC
  Administered 2021-09-10: 15 mg via INTRAVENOUS
  Filled 2021-09-10: qty 1

## 2021-09-10 MED ORDER — TRAMADOL HCL 50 MG PO TABS
50.0000 mg | ORAL_TABLET | Freq: Two times a day (BID) | ORAL | Status: DC | PRN
  Administered 2021-09-10: 50 mg via ORAL
  Filled 2021-09-10: qty 1

## 2021-09-10 MED ORDER — ACETAMINOPHEN-CAFFEINE 500-65 MG PO TABS
1.0000 | ORAL_TABLET | Freq: Two times a day (BID) | ORAL | Status: DC | PRN
  Administered 2021-09-10 – 2021-09-11 (×2): 1 via ORAL
  Filled 2021-09-10 (×3): qty 1

## 2021-09-10 NOTE — Evaluation (Signed)
Occupational Therapy Evaluation Patient Details Name: Taylor Austin MRN: [id number] DOB: [birth year] Today's Date: 09/10/2021   History of Present Illness Taylor Austin is a 81 y.o. male with medical history significant for Chronic Mild Dementia (follows with Dr. Manuella Ghazi), Hypertension, Hyperlipidemia, Diabetes Mellitus Type 2, 07/24/2021 Left Occipital Stroke s /p Tenecteplase with residual right sided deficits, and history of 2018 stroke, and OSA (on CPAP nightly) who presents to the ED after waking up this morning with a new left sided facial droop and mild left upper extremity weakness.  Patient states "I am a little more weak than my usual."  His last known normal was yesterday evening.  Neurology evaluated patient and deemed him to not be a TPA candidate.  Taylor Austin recently completed 6 weeks of physical therapy.  His wife states that patient takes his Aspirin and Plavix every day and does not miss a dose.  He denies any fevers, chills, chest pain, shortness of breath, nausea, vomiting, diarrhea, abdominal pain, or urinary symptoms. MRI reveals acute lacunar infarct in R corona radiata.   Clinical Impression   Pt in bed upon OT arrival but agreeable to OT evaluation and asking to sit up after using the bathroom.  OT assisted with functional mobility with RW, requiring min vc for slow pace for safety d/t decreased coordination on the LUE and having to turn corners to maneuver walker.  Pt reported L knee pain to be slightly better than earlier this morning.  Pt was able to doff underwear, change pad, and don again all with set up/supv and extra time to engage the L hand.  OT provided teal, soft theraputty and instructed in exercises for gripping and pinching, as well as LUE AROM, see note for details.  Pt presents with normal ROM on the LUE, but arm feels "heavy," and presents with mild apraxia and ataxia throughout the LUE.  Pt makes good attempts at reaching for the arm rests of his chair and  holding to the grab bar in the bathroom with the L hand with fairly accuracy, but extra time to secure hand.  Pt will benefit from continued skilled OT in the acute setting to progress HEP for the LUE, and to maximize safety and indep with ADLs.  Would benefit instruction in AE for LB dressing as spouse managed socks at baseline.  Recommend HH OT upon discharge to continue to address weakness and coordination deficits throughout the LUE which hinder independence with daily tasks.       Recommendations for follow up therapy are one component of a multi-disciplinary discharge planning process, led by the attending physician.  Recommendations may be updated based on patient status, additional functional criteria and insurance authorization.   Follow Up Recommendations  Home health OT    Assistance Recommended at Discharge Intermittent Supervision/Assistance  Patient can return home with the following A little help with walking and/or transfers;A little help with bathing/dressing/bathroom;Assistance with cooking/housework    Functional Status Assessment  Patient has had a recent decline in their functional status and demonstrates the ability to make significant improvements in function in a reasonable and predictable amount of time.  Equipment Recommendations  None recommended by OT    Recommendations for Other Services       Precautions / Restrictions Precautions Precautions: Fall Restrictions Weight Bearing Restrictions: No      Mobility Bed Mobility Overal bed mobility: Needs Assistance Bed Mobility: Supine to Sit     Supine to sit: HOB elevated, Min assist  General bed mobility comments: heavy use of rails Patient Response: Cooperative  Transfers Overall transfer level: Needs assistance Equipment used: Rolling walker (2 wheels) Transfers: Sit to/from Stand Sit to Stand: Min guard                  Balance Overall balance assessment: Needs  assistance Sitting-balance support: Feet supported Sitting balance-Leahy Scale: Good     Standing balance support: Bilateral upper extremity supported, During functional activity, Reliant on assistive device for balance Standing balance-Leahy Scale: Poor                             ADL either performed or assessed with clinical judgement   ADL Overall ADL's : Needs assistance/impaired Eating/Feeding: Set up Eating/Feeding Details (indicate cue type and reason): difficulty opening containers Grooming: Wash/dry hands;Supervision/safety;Standing               Lower Body Dressing: Supervision/safety;Set up;Sit to/from stand Lower Body Dressing Details (indicate cue type and reason): Pt was able to doff underwear and apply new pad to his brief, thread LEs through underwear again in sitting, and hike in standing; extra time to engage the LUE. Toilet Transfer: Supervision/safety;Grab bars;Cueing for safety   Toileting- Clothing Manipulation and Hygiene: Supervision/safety Toileting - Clothing Manipulation Details (indicate cue type and reason): extra time to engage LUE into hiking/lowering pants     Functional mobility during ADLs: Min guard;Rolling walker (2 wheels);Cueing for safety General ADL Comments: Pt was able to amb to bathroom for toileting with min guard, RW, and cues for slowing pace d/t LUE coordination deficits impacting walker use, and pt repeatedly verbalizing L knee cracking and pain.     Vision Patient Visual Report: No change from baseline            Praxis Praxis Praxis: Impaired Praxis Impairment Details: Motor planning    Pertinent Vitals/Pain Pain Assessment Pain Score: 5  Pain Location: L knee pain and head ache Pain Descriptors / Indicators: Discomfort, Sore, Grimacing, Aching Pain Intervention(s): Monitored during session, Premedicated before session, Limited activity within patient's tolerance     Hand Dominance Right    Extremity/Trunk Assessment Upper Extremity Assessment Upper Extremity Assessment: LUE deficits/detail LUE Deficits / Details: LUE grossly 4/5 throughout as compared to 5/5 in the RUE.  LUE is mildly apraxic and ataxic.  Able to oppose each digit to thumb on the L with extra time, finger to nose mildly impaired.  Pt states, "It just feels heavy." LUE Sensation: WNL LUE Coordination: decreased fine motor;decreased gross motor   Lower Extremity Assessment Lower Extremity Assessment: Defer to PT evaluation LLE Deficits / Details: patient has chronic L knee pain which is limiting mobility this session. Patient L knee buckling with each step, antalgic gait pattern. LLE Coordination: decreased gross motor   Cervical / Trunk Assessment Cervical / Trunk Assessment: Normal   Communication Communication Communication: No difficulties;HOH   Cognition Arousal/Alertness: Awake/alert Behavior During Therapy: WFL for tasks assessed/performed Overall Cognitive Status: Within Functional Limits for tasks assessed                                       General Comments       Exercises Other Exercises Other Exercises: issued teal, soft theraputty and instructed pt in gripping and pinching exercises for the L hand.  Vc needed to limit use of R  hand to reposition putty, encouraging L hand manipulation skills.  Instructed pt in reps of functional reaching patterns for the L shoulder, including overhead, behind head, opposite shoulder, working to increase strength and Kenneth for UB ADLs. Good return demo but further reinforcement needed for technique and progression.   Shoulder Instructions      Home Living Family/patient expects to be discharged to:: Private residence Living Arrangements: Spouse/significant other Available Help at Discharge: Family;Available 24 hours/day Type of Home: House Home Access: Stairs to enter [employer] of Steps: 1   Home Layout: Two level;1/2 bath  on main level;Bed/bath upstairs Alternate Level Stairs-Number of Steps: has chair lift for stairs   Bathroom Shower/Tub: Occupational psychologist: Standard     Home Equipment: Conservation officer, nature (2 wheels);Shower seat - built in;Other (comment)          Prior Functioning/Environment Prior Level of Function : Independent/Modified Independent             Mobility Comments: ambulation using RW, or 4 prong cane, chronic back pain, chronic L knee pain ADLs Comments: Pt was mostly independent with ADLs, pt wife assists wtih donning socks.        OT Problem List: Decreased strength;Decreased coordination;Obesity;Decreased activity tolerance;Impaired balance (sitting and/or standing);Pain;Decreased safety awareness      OT Treatment/Interventions: Self-care/ADL training;Therapeutic exercise;Patient/family education;Neuromuscular education;Balance training;Therapeutic activities;DME and/or AE instruction    OT Goals(Current goals can be found in the care plan section) Acute Rehab OT Goals Patient Stated Goal: Improve strength and coordination in the LUE OT Goal Formulation: With patient Time For Goal Achievement: 09/24/21 Potential to Achieve Goals: Good  OT Frequency: Min 2X/week                  AM-PAC OT "6 Clicks" Daily Activity     Outcome Measure Help from another person eating meals?: None Help from another person taking care of personal grooming?: A Little Help from another person toileting, which includes using toliet, bedpan, or urinal?: A Little Help from another person bathing (including washing, rinsing, drying)?: A Little Help from another person to put on and taking off regular upper body clothing?: A Little Help from another person to put on and taking off regular lower body clothing?: A Little 6 Click Score: 19   End of Session Equipment Utilized During Treatment: Gait belt;Rolling walker (2 wheels) Nurse Communication: Mobility status  Activity  Tolerance: Patient tolerated treatment well Patient left: in chair;with call bell/phone within reach;with chair alarm set  OT Visit Diagnosis: Unsteadiness on feet (R26.81);Muscle weakness (generalized) (M62.81)                Time: 7517-0017 OT Time Calculation (min): 42 min Charges:  OT General Charges $OT Visit: 1 Visit OT Evaluation $OT Eval Moderate Complexity: 1 Mod OT Treatments $Neuromuscular Re-education: 8-22 mins  Leta Speller, MS, OTR/L   Darleene Cleaver 09/10/2021, 1:56 PM

## 2021-09-10 NOTE — Plan of Care (Signed)
Neurology plan of care  Please see stroke code note from yesterday for full findings and initial recommendations. This is a 81 yo man with hx mild dementia, HTN, HL, L occipital ischemic stroke one month ago after which he got TNK and had residual [facility] who presents after awakening yesterday with new L sided facial droop and mild LUE weakness c/f recurrent ischemic event. MRI brain after admission was (+) for acute infarct R corona radiata (personal review). CTA H&N showed no LVO, increased moderate R P1 stenosis, and unchanged severe R and mild L vert origin stenosis.  Final neurology recommendations: - Stroke workup is now completed. No further inpatient neurologic workup indicated. - Acute infarct this admission is c/w lacunar / small vessel etiology. However I am concerned that he has now had 2 acute ischemic strokes in 6 wks despite being on DAPT outpatient (first infarct was larger, approx 4cm). I would recommend touching base with cardiology prior to discharge to arrange outpatient amb cardiac monitoring - Permissive HTN for another 24 hrs goal BP <220/110. PRN labetalol or hydralazine if BP above these parameters. Avoid oral antihypertensives. Starting tomorrow gently lower to goal normotension; avoid hypotension esp given severe vert stenosis. - Continue home DAPT w/ ASA '81mg'$  daily and plavix '75mg'$  daily - Continue atorvastatin '40mg'$  daily; LDL is at goal - q4 hr neuro checks - STAT head CT for any change in neuro exam - Tele - PT/OT/SLP - Stroke education - Patient may f/u with established outpatient neurologist Dr. Manuella Ghazi  OK to discharge from neuro standpoint when otherwise medically ready and after cardiology arranges amb cardiac monitoring. Neurology to sign off but please re-engage if any additional neurologic concerns arise.  Taylor Monks, MD [facility] [phone]  If 7pm- 7am, please page neurology on call as listed in [place].

## 2021-09-10 NOTE — Evaluation (Signed)
Physical Therapy Evaluation Patient Details Name: Taylor Austin MRN: [id number] DOB: 16-Sep-1940 Today's Date: 09/10/2021  History of Present Illness  Taylor Austin is a 81 y.o. male with medical history significant for Chronic Mild Dementia (follows with Dr. Manuella Ghazi), Hypertension, Hyperlipidemia, Diabetes Mellitus Type 2, 07/24/2021 Left Occipital Stroke s /p Tenecteplase with residual right sided deficits, and history of 2018 stroke, and OSA (on CPAP nightly) who presents to the ED after waking up this morning with a new left sided facial droop and mild left upper extremity weakness.  Patient states "I am a little more weak than my usual."  His last known normal was yesterday evening.  Neurology evaluated patient and deemed him to not be a TPA candidate.  Taylor Austin recently completed 6 weeks of physical therapy.  His wife states that patient takes his Aspirin and Plavix every day and does not miss a dose.  He denies any fevers, chills, chest pain, shortness of breath, nausea, vomiting, diarrhea, abdominal pain, or urinary symptoms. MRI reveals acute lacunar infarct in R corona radiata.   Clinical Impression  Patient received in bed, he is agreeable to PT assessment. Reports mild increased L UE weakness. Reports L LE feels about the same. Wife is present for evaluation. Patient required mod A for supine to sit, heavy use of bed rails. Min guard for sit to stand. Once standing patient having a very difficult time ambulating with RW due to L knee pain. Patient reports chronic L knee pain. Very antalgic gait pattern, constant cues for safety with RW to assist with support of L LE. Patient will continue to benefit from skilled PT while here to improve functional independence and safety with mobility.        Recommendations for follow up therapy are one component of a multi-disciplinary discharge planning process, led by the attending physician.  Recommendations may be updated based on patient status,  additional functional criteria and insurance authorization.  Follow Up Recommendations Home health PT      Assistance Recommended at Discharge Frequent or constant Supervision/Assistance  Patient can return home with the following  A lot of help with walking and/or transfers;A little help with bathing/dressing/bathroom;Assist for transportation;Assistance with cooking/housework    Equipment Recommendations None recommended by PT  Recommendations for Other Services       Functional Status Assessment Patient has had a recent decline in their functional status and demonstrates the ability to make significant improvements in function in a reasonable and predictable amount of time.     Precautions / Restrictions Precautions Precautions: Fall Restrictions Weight Bearing Restrictions: No      Mobility  Bed Mobility Overal bed mobility: Needs Assistance Bed Mobility: Supine to Sit, Sit to Supine     Supine to sit: HOB elevated, Min assist Sit to supine: Min assist   General bed mobility comments: heavy use of rails    Transfers Overall transfer level: Needs assistance Equipment used: Rolling walker (2 wheels) Transfers: Sit to/from Stand Sit to Stand: Min assist                Ambulation/Gait Ambulation/Gait assistance: Herbalist (Feet): 30 Feet Assistive device: Rolling walker (2 wheels) Gait Pattern/deviations: Step-to pattern, Decreased step length - right, Decreased step length - left, Decreased stride length, Decreased weight shift to left Gait velocity: decr     General Gait Details: Patient requiring close assist and heavy use of RW due to L knee pain with weight bearing. Constant cues to  stay close to RW for improved support. L hand with limited ability to assist on walker due to weakness from new CVA. Required seated rest prior to making it back to bed. At increased fall risk. May benefit from chair follow if knee pain not  improved.  Stairs            Wheelchair Mobility    Modified Rankin (Stroke Patients Only)       Balance Overall balance assessment: Needs assistance Sitting-balance support: Feet supported Sitting balance-Leahy Scale: Good     Standing balance support: Bilateral upper extremity supported, During functional activity, Reliant on assistive device for balance Standing balance-Leahy Scale: Poor                               Pertinent Vitals/Pain Pain Assessment Pain Assessment: Faces Faces Pain Scale: Hurts even more Pain Location: L knee Pain Descriptors / Indicators: Discomfort, Sore, Grimacing Pain Intervention(s): Monitored during session, Limited activity within patient's tolerance, Repositioned    Home Living Family/patient expects to be discharged to:: Private residence Living Arrangements: Spouse/significant other Available Help at Discharge: Family;Available 24 hours/day Type of Home: House Home Access: Stairs to enter   Entrance Stairs-Number of Steps: 1 Alternate Level Stairs-Number of Steps: has chair lift for stairs Home Layout: Two level;1/2 bath on main level;Bed/bath upstairs Home Equipment: Rolling Walker (2 wheels);Shower seat - built in;Other (comment)      Prior Function Prior Level of Function : Independent/Modified Independent             Mobility Comments: ambulation using RW, or 4 prong cane, chronic back pain, chronic L knee pain ADLs Comments: Pt was mostly independent with ADLs, pt wife assists wtih donning socks.     Hand Dominance   Dominant Hand: Right    Extremity/Trunk Assessment   Upper Extremity Assessment Upper Extremity Assessment: Defer to OT evaluation    Lower Extremity Assessment Lower Extremity Assessment: LLE deficits/detail LLE Deficits / Details: patient has chronic L knee pain which is limiting mobility this session. Patient L knee buckling with each step, antalgic gait pattern. LLE  Coordination: decreased gross motor    Cervical / Trunk Assessment Cervical / Trunk Assessment: Normal  Communication   Communication: No difficulties;HOH  Cognition Arousal/Alertness: Awake/alert Behavior During Therapy: WFL for tasks assessed/performed Overall Cognitive Status: Within Functional Limits for tasks assessed                                          General Comments      Exercises     Assessment/Plan    PT Assessment Patient needs continued PT services  PT Problem List Decreased strength;Decreased mobility;Decreased activity tolerance;Decreased balance;Pain;Decreased knowledge of precautions;Decreased safety awareness;Decreased coordination       PT Treatment Interventions DME instruction;Therapeutic exercise;Gait training;Functional mobility training;Therapeutic activities;Patient/family education    PT Goals (Current goals can be found in the Care Plan section)  Acute Rehab PT Goals Patient Stated Goal: return home PT Goal Formulation: With patient/family Time For Goal Achievement: 09/17/21 Potential to Achieve Goals: Fair    Frequency 7X/week     Co-evaluation               AM-PAC PT "6 Clicks" Mobility  Outcome Measure Help needed turning from your back to your side while in a flat bed without using  bedrails?: A Lot Help needed moving from lying on your back to sitting on the side of a flat bed without using bedrails?: A Lot Help needed moving to and from a bed to a chair (including a wheelchair)?: A Lot Help needed standing up from a chair using your arms (e.g., wheelchair or bedside chair)?: A Little Help needed to walk in hospital room?: A Lot Help needed climbing 3-5 steps with a railing? : Total 6 Click Score: 12    End of Session Equipment Utilized During Treatment: Gait belt Activity Tolerance: Patient limited by pain Patient left: in bed;with call bell/phone within reach;with bed alarm set;with family/visitor  present Nurse Communication: Mobility status PT Visit Diagnosis: Other abnormalities of gait and mobility (R26.89);Muscle weakness (generalized) (M62.81);Difficulty in walking, not elsewhere classified (R26.2);Pain Pain - Right/Left: Left Pain - part of body: Knee    Time: 0174-9449 PT Time Calculation (min) (ACUTE ONLY): 16 min   Charges:   PT Evaluation $PT Eval Moderate Complexity: 1 Mod          Aaryan Essman, PT, GCS 09/10/21,11:07 AM

## 2021-09-10 NOTE — Progress Notes (Signed)
SLP Cancellation Note  Patient Details Name: Taylor Austin MRN: [id number] DOB: 07-18-1940   Cancelled treatment:       Reason Eval/Treat Not Completed: SLP screened, no needs identified, will sign off   Toran Murch 09/10/2021, 10:00 AM

## 2021-09-10 NOTE — Therapy (Signed)
Pt unable to tolerate mask at 2345 at this time due to his nose being stopped up. Pt would like to wait and use nasal spray to open up his nose before using the CPAP mask.

## 2021-09-10 NOTE — Progress Notes (Signed)
Progress Note   Taylor Austin: Taylor Austin VVO:[id number] DOB: [birth year] DOA: 09/09/2021     1 DOS: the Taylor Austin was seen and examined on 09/10/2021   Brief hospital course: Taylor Austin is a 81 y.o. male with medical history significant for Chronic Mild Dementia (follows with Dr. Manuella Ghazi), HTN, HL, DM2, 07/24/2021 Left Occipital Stroke s /p Tenecteplase with residual right sided deficits, and history of 2018 stroke (Taylor Austin is on Aspirin and Plavix), and OSA (on CPAP nightly) who presented to the ED on 9/2 with a new left sided facial droop and mild left upper extremity weakness.  MRI shows new small acute stroke of the right corona radiata.  Taylor Austin had two ischemic strokes in 6 weeks while on DAPT, which is concerning for embolic stroke.  Cardiology will be consulted for Holter monitor.   Assessment and Plan: Acute CVA s/p 07/24/2021 Left Occipital Stroke with Right Sided Deficits: Taylor Austin presents with new mild left sided facial droop and left upper extremity weakness.  Symptoms have nearly resolved. - Continue Aspirin, Plavix, and Statin. - 07/2021 TTE reviewed.  There is no need to order another Echo. - Head CT and CTA showed no abnormality.  MRI of the brain showed a new stroke of the right corona radiata.  Neurology believes this is an extension of his recent stroke.  There are suspicions of an embolic stroke though as Taylor Austin has been on DAPT for 33 days and developed a new symptomatic ischemic event - so he will need a Holter on discharge. - There have been no events on telemetry x 24 hours.  Consult Cardiology to organize Holter monitor outpatient, appreciate. - Continue to monitor temp, BP, and glucose. - PT/OT recommends Home Health PT/OT services.  Discharge is planned for tomorrow. - Follow up with Neurology Dr. Manuella Ghazi outpatient.   Migraines: Taylor Austin has allergy to Tylenol and Oxycodone. - Try Toradol 15 mg/mL and Phenergan 12.5 mg x once. - Taylor Austin can have Excedrin PRN if he  prefers. - Recommend rimegepant outpatient.  Chronic Dementia: Mental status is at baseline. - Continue home Donepezil 10 mg nightly.   Hypertension: BP is stable. - Hold BP meds for now to allow permissive hypertension.   Hyperlipidemia: - Atorvastatin 40 mg daily.   Diabetes Mellitus Type 2: Glucose is stable. - Continue Lispro Ssi with POC glucose ACHS. - Discontinue home glitazone as Taylor Austin has diastolic heart failure.   Grade 1 Diastolic Heart Failure: - Taylor Austin is euvolemic.   GERD: - PPI.   BPH / History of Prostate Cancer: - Tamsulosin and Finasteride.   Peripheral Neuropathy / Lumbosacral Radiculopathy at S1 / DDD: - Duloxetine 60 mg daily.   OSA - CPAP nightly.       Subjective: Taylor Austin is lying in bed comfortably.  His weakness has almost resolved.  HE has no facial droop.  He endorses a mild headache.  He denies any chest pain or shortness of breath.  Plan of care was discussed with Taylor Austin and wife at bedside.  Physical Exam: Vitals:   09/09/21 2358 09/10/21 0328 09/10/21 0808 09/10/21 1152  BP: (!) 143/65 116/66 133/79 124/61  Pulse: 75 79 89 76  Resp: '18 17 18 16  '$ Temp: 98.3 F (36.8 C) 98.1 F (36.7 C) 98.8 F (37.1 C) 97.7 F (36.5 C)  TempSrc:    Oral  SpO2: 98% 94% 90% 94%  Weight:      Height:       General exam: chronically ill appearing lying in  bed HEENT: NCAT, PERRL Respiratory system: CTAB no WRR Cardiovascular system: Did not appreciate a murmur, regular, No JVD. Gastrointestinal system: No flank pain, Abdomen soft, NT,ND, BS+. Nervous System: Left sided facial droop, mild LUE weakness, chronic right sided deficits Extremities: No edema, distal peripheral pulses palpable.  Skin: No rashes MSK: Physical Deconditioning Data Reviewed:  MR BRAIN WO CONTRAST CLINICAL DATA:  Neuro deficit, acute, stroke suspected. Left-sided weakness.  EXAM: MRI HEAD WITHOUT CONTRAST  TECHNIQUE: Multiplanar, multiecho pulse sequences  of the brain and surrounding structures were obtained without intravenous contrast.  COMPARISON:  Head CT and CTA 09/09/2021.  Head MRI 07/24/2021.  FINDINGS: Brain: There is an acute lacunar infarct in the right corona radiata. There has been interval evolution of the left PCA infarct since July with development of encephalomalacia, and a small amount of chronic blood products are noted. T2 hyperintensities elsewhere in the cerebral white matter bilaterally are unchanged and nonspecific but compatible with chronic small vessel ischemic disease with multiple chronic infarcts again noted in the left greater than right cerebral hemispheric white matter. There is mild cerebral atrophy. No mass, midline shift, or extra-axial fluid collection is identified.  Vascular: Major intracranial vascular flow voids are preserved.  Skull and upper cervical spine: Unremarkable bone marrow signal para  Sinuses/Orbits: Bilateral cataract extraction. Trace bilateral mastoid fluid. Clear paranasal sinuses.  Other: None.  IMPRESSION: 1. Acute lacunar infarct in the right corona radiata. 2. Chronic small vessel ischemic disease with multiple chronic infarcts as above.  Electronically Signed   By: Logan Bores M.D.   On: 09/09/2021 17:10 CT ANGIO HEAD NECK W WO CM W PERF CLINICAL DATA:  Neuro deficit, acute, stroke suspected. Left-sided weakness and facial droop.  EXAM: CT ANGIOGRAPHY HEAD AND NECK  TECHNIQUE: Multidetector CT imaging of the head and neck was performed using the standard protocol during bolus administration of intravenous contrast. Multiplanar CT image reconstructions and MIPs were obtained to evaluate the vascular anatomy. Carotid stenosis measurements (when applicable) are obtained utilizing NASCET criteria, using the distal internal carotid diameter as the denominator.  RADIATION DOSE REDUCTION: This exam was performed according to the departmental dose-optimization  program which includes automated exposure control, adjustment of the mA and/or kV according to Taylor Austin size and/or use of iterative reconstruction technique.  CONTRAST:  59m OMNIPAQUE IOHEXOL 350 MG/ML SOLN  COMPARISON:  Head and neck CTA 07/23/2021  FINDINGS: CTA NECK FINDINGS  Aortic arch: Standard 3 vessel aortic arch with mild atherosclerotic plaque. No arch vessel origin stenosis.  Right carotid system: Patent with a moderate amount of calcified plaque at the carotid bifurcation and in the proximal ICA. No evidence of a significant stenosis or dissection.  Left carotid system: Patent with a moderate amount of calcified plaque at the carotid bifurcation and in the proximal ICA. No evidence of a significant stenosis or dissection.  Vertebral arteries: Patent with dense atherosclerotic calcification at the origins of both vertebral arteries resulting in likely severe stenosis on the right and mild stenosis on the left, unchanged.  Skeleton: Solid C5-C7 ACDF. Advanced upper cervical facet arthrosis with minimal anterolisthesis of C3 on C4 and C4 on C5.  Other neck: No evidence of cervical lymphadenopathy or mass.  Upper chest: Clear lung apices.  Review of the MIP images confirms the above findings  CTA HEAD FINDINGS  Anterior circulation: The internal carotid arteries are patent from skull base to carotid termini with mild atherosclerotic plaque bilaterally not resulting in significant stenosis. ACAs and MCAs are patent  without evidence of a proximal branch occlusion or significant proximal stenosis. Diffusely attenuated appearance of the branch vessels on today's study compared to the prior CTA is likely technical. No aneurysm is identified.  Posterior circulation: The intracranial vertebral arteries are patent to the basilar with the left being mildly dominant. Patent PICA and AICA origins are seen bilaterally. The basilar artery is widely patent with a suspected  small incidental fenestration proximally. There are small right and diminutive or absent left posterior communicating arteries. Both PCAs are patent without evidence of a significant proximal stenosis on the left. There is a moderate right P1 stenosis which is new or increased compared to the prior study. No aneurysm is identified.  Venous sinuses: As permitted by contrast timing, patent.  Anatomic variants: None.  Review of the MIP images confirms the above findings  These results were communicated to Dr. Quinn Axe at 1:24 pm on 09/09/2021 by text page via the [facility] messaging system.  IMPRESSION: 1. No large vessel occlusion. 2. New or increased moderate right P1 stenosis. 3. Unchanged severe right and mild left vertebral artery origin stenoses. 4. Cervical carotid artery atherosclerosis without significant stenosis. 5. Aortic Atherosclerosis (ICD10-I70.0).  Electronically Signed   By: Logan Bores M.D.   On: 09/09/2021 13:35 CT HEAD CODE STROKE WO CONTRAST Addendum: ADDENDUM REPORT: 09/09/2021 12:15   ADDENDUM:  Study discussed by telephone with Dr. Marjean Donna on 09/09/2021 at 1203  hours.   Electronically Signed    By: Genevie Ann M.D.    On: 09/09/2021 12:15 Narrative: CLINICAL DATA:  Code stroke. 81 year old male with left side weakness. Status post left PCA territory infarct in July.  EXAM: CT HEAD WITHOUT CONTRAST  TECHNIQUE: Contiguous axial images were obtained from the base of the skull through the vertex without intravenous contrast.  RADIATION DOSE REDUCTION: This exam was performed according to the departmental dose-optimization program which includes automated exposure control, adjustment of the mA and/or kV according to Taylor Austin size and/or use of iterative reconstruction technique.  COMPARISON:  Brain MRI and head CT 07/24/2021.  FINDINGS: Brain: Expected evolution of patchy left PCA territory encephalomalacia since July. Superimposed asymmetric cystic  areas of cerebral white matter encephalomalacia more pronounced in the left hemisphere, also stable.  No midline shift, ventriculomegaly, mass effect, evidence of mass lesion, intracranial hemorrhage or evidence of cortically based acute infarction.  Vascular: Calcified atherosclerosis at the skull base. No suspicious intracranial vascular hyperdensity.  Skull: No acute osseous abnormality identified.  Sinuses/Orbits: Visualized paranasal sinuses and mastoids are stable and well aerated.  Other: No acute orbit or scalp soft tissue finding.  ASPECTS [facility] Stroke Program Early CT Score)  Total score (0-10 with 10 being normal): 10  IMPRESSION: 1. No acute cortically based infarct or intracranial hemorrhage identified. ASPECTS 10. 2. Expected evolution of the Left PCA territory infarct since July. Chronic white matter disease.  Electronically Signed: By: Genevie Ann M.D. On: 09/09/2021 12:00  Lab Results  Component Value Date   WBC 4.3 09/10/2021   HGB 10.6 (L) 09/10/2021   HCT 32.9 (L) 09/10/2021   MCV 91.1 09/10/2021   PLT 155 16/96/7893   Last metabolic panel Lab Results  Component Value Date   GLUCOSE 124 (H) 09/10/2021   NA 134 (L) 09/10/2021   K 3.9 09/10/2021   CL 100 09/10/2021   CO2 25 09/10/2021   BUN 12 09/10/2021   CREATININE 0.75 09/10/2021   GFRNONAA >60 09/10/2021   CALCIUM 8.7 (L) 09/10/2021   PHOS 4.3  07/26/2021   PROT 6.3 (L) 09/09/2021   ALBUMIN 3.6 09/09/2021   BILITOT 0.7 09/09/2021   ALKPHOS 55 09/09/2021   AST 20 09/09/2021   ALT 18 09/09/2021   ANIONGAP 9 09/10/2021     Family Communication: I spoke with wife and Taylor Austin at bedside.  Disposition: Status is: Inpatient Remains inpatient appropriate because: Stroke protocol.  Planned Discharge Destination: Home with Home Health    Time spent: >35  minutes  Author: George Hugh, MD 09/10/2021 1:12 PM  For on call review [email or web address].

## 2021-09-10 NOTE — Plan of Care (Signed)
  Problem: Education: Goal: Knowledge of General Education information will improve Description: Including pain rating scale, medication(s)/side effects and non-pharmacologic comfort measures Outcome: Progressing   Problem: Health Behavior/Discharge Planning: Goal: Ability to manage health-related needs will improve Outcome: Progressing   Problem: Clinical Measurements: Goal: Ability to maintain clinical measurements within normal limits will improve Outcome: Progressing Goal: Will remain free from infection Outcome: Progressing Goal: Diagnostic test results will improve Outcome: Progressing Goal: Cardiovascular complication will be avoided Outcome: Progressing   Problem: Elimination: Goal: Will not experience complications related to bowel motility Outcome: Progressing   Problem: Safety: Goal: Ability to remain free from injury will improve Outcome: Progressing   Problem: Skin Integrity: Goal: Risk for impaired skin integrity will decrease Outcome: Progressing   Problem: Education: Goal: Ability to describe self-care measures that may prevent or decrease complications (Diabetes Survival Skills Education) will improve Outcome: Progressing Goal: Individualized Educational Video(s) Outcome: Progressing   Problem: Coping: Goal: Ability to adjust to condition or change in health will improve Outcome: Progressing   Problem: Health Behavior/Discharge Planning: Goal: Ability to identify and utilize available resources and services will improve Outcome: Progressing Goal: Ability to manage health-related needs will improve Outcome: Progressing   Problem: Metabolic: Goal: Ability to maintain appropriate glucose levels will improve Outcome: Progressing   Problem: Nutritional: Goal: Progress toward achieving an optimal weight will improve Outcome: Progressing   Problem: Skin Integrity: Goal: Risk for impaired skin integrity will decrease Outcome: Progressing

## 2021-09-11 DIAGNOSIS — I10 Essential (primary) hypertension: Secondary | ICD-10-CM | POA: Diagnosis not present

## 2021-09-11 LAB — CBC
HCT: 35.1 % — ABNORMAL LOW (ref 39.0–52.0)
Hemoglobin: 11.4 g/dL — ABNORMAL LOW (ref 13.0–17.0)
MCH: 29.2 pg (ref 26.0–34.0)
MCHC: 32.5 g/dL (ref 30.0–36.0)
MCV: 90 fL (ref 80.0–100.0)
Platelets: 169 10*3/uL (ref 150–400)
RBC: 3.9 MIL/uL — ABNORMAL LOW (ref 4.22–5.81)
RDW: 15.4 % (ref 11.5–15.5)
WBC: 3.6 10*3/uL — ABNORMAL LOW (ref 4.0–10.5)
nRBC: 0 % (ref 0.0–0.2)

## 2021-09-11 LAB — BASIC METABOLIC PANEL
Anion gap: 7 (ref 5–15)
BUN: 18 mg/dL (ref 8–23)
CO2: 27 mmol/L (ref 22–32)
Calcium: 8.6 mg/dL — ABNORMAL LOW (ref 8.9–10.3)
Chloride: 99 mmol/L (ref 98–111)
Creatinine, Ser: 0.93 mg/dL (ref 0.61–1.24)
GFR, Estimated: >60 mL/min (ref >60)
Glucose, Bld: 141 mg/dL — ABNORMAL HIGH (ref 70–99)
Potassium: 4 mmol/L (ref 3.5–5.1)
Sodium: 133 mmol/L — ABNORMAL LOW (ref 135–145)

## 2021-09-11 LAB — GLUCOSE, CAPILLARY: Glucose-Capillary: 131 mg/dL — ABNORMAL HIGH (ref 70–99)

## 2021-09-11 NOTE — TOC Initial Note (Signed)
Transition of Care [facility]) - Initial/Assessment Note    Patient Details  Name: Taylor Austin MRN: [id number] Date of Birth: 07/24/1940  Transition of Care [facility]) CM/SW Contact:    Ninfa Meeker, RN Phone Number: 09/11/2021, 9:09 AM  Clinical Narrative:  Case Manager spoke with patient's wife, [name] [phone], to discuss discharge plan for patient. Mrs. Mengel states that they just completed therapy with [facility] and she would like to resume with them and specifically requested Mel as therapist. CM contacted Adela Lank, [place] Liaison with referral and request. Patient has RW, 2 canes , chairlift and walk in shower at home. Will have support of wife at discharge.                      Expected Discharge Plan: [place] Barriers to Discharge: No Barriers Identified   Patient Goals and CMS Choice     Choice offered to / list presented to : Spouse  Expected Discharge Plan and Services Expected Discharge Plan: [place] In-house Referral: NA Discharge Planning Services: CM Consult Post Acute Care Choice: [place] arrangements for the past 2 months: Single Family Home                 DME Arranged: N/A DME Agency: NA       HH Arranged: PT, OT HH Agency: [place] Date [facility] Agency Contacted: 09/11/21 Time HH Agency Contacted: 0017 Representative spoke with at [place]: Adela Lank  Prior Living Arrangements/Services Living arrangements for the past 2 months: Single Family Home Lives with:: Spouse Patient language and need for interpreter reviewed:: Yes Do you feel safe going back to the place where you live?: Yes      Need for Family Participation in Patient Care: Yes (Comment) Care giver support system in place?: Yes (comment) Current home services: DME (has RW, canes, chair lift) Criminal Activity/Legal Involvement Pertinent to Current Situation/Hospitalization: No - Comment as needed  Activities  of Daily Living Home Assistive Devices/Equipment: Cane (specify quad or straight) ADL Screening (condition at time of admission) Patient's cognitive ability adequate to safely complete daily activities?: Yes Is the patient deaf or have difficulty hearing?: No Does the patient have difficulty seeing, even when wearing glasses/contacts?: No Does the patient have difficulty concentrating, remembering, or making decisions?: No Patient able to express need for assistance with ADLs?: Yes Does the patient have difficulty dressing or bathing?: No Independently performs ADLs?: Yes (appropriate for developmental age) Does the patient have difficulty walking or climbing stairs?: No Weakness of Legs: None Weakness of Arms/Hands: Left  Permission Sought/Granted         Permission granted to share info w AGENCY: [facility]        Emotional Assessment         Alcohol / Substance Use: Not Applicable Psych Involvement: No (comment)  Admission diagnosis:  TIA (transient ischemic attack) [G45.9] Stroke-like symptoms [R29.90] Patient Active Problem List   Diagnosis Date Noted   Stroke-like symptoms 09/09/2021   Chronic diastolic CHF (congestive heart failure) ([place]) 07/26/2021   Ischemic stroke ([place]) 07/23/2021   Acute ischemic stroke ([place]) 07/23/2021   Cerebral infarction, unspecified ([place]) 07/23/2021   Fasting hyperglycemia 07/18/2021   Orthostatic hypotension 07/18/2021   Lumbar facet syndrome 12/20/2020   Vertigo, Chronic 12/07/2020   Retrolisthesis of L2 over L3 12/07/2020   At high risk for falls 12/07/2020   Aortic atherosclerosis ([place]) 09/20/2020  Hypomagnesemia 07/27/2020   Spondylosis without myelopathy or radiculopathy, lumbosacral region 07/27/2020   Mixed Alzheimer's and vascular dementia ([place]) 06/17/2020   Chronic pain syndrome 06/15/2020   Pharmacologic therapy 06/15/2020   Disorder of skeletal system 06/15/2020   Problems influencing health status 06/15/2020   Abnormal  MRI, cervical spine (11/14/2019) 06/15/2020   Abnormal MRI, lumbar spine (11/14/2019) 06/15/2020   History of lumbar laminectomy 06/15/2020   History of fusion of cervical spine 06/15/2020   DDD (degenerative disc disease), cervical 06/15/2020   DDD (degenerative disc disease), lumbosacral 06/15/2020   Lumbosacral facet arthropathy (Multilevel) (Bilateral) 06/15/2020   Cervical facet hypertrophy 06/15/2020   DJD (degenerative joint disease) of cervical spine 06/15/2020   Chronic anticoagulation (Plavix) 06/15/2020   Lumbar facet joint pain (Bilateral) 06/15/2020   History of CVA (cerebrovascular accident) 06/15/2020   Poor historian 06/15/2020   Chronic low back pain (1ry area of Pain) (Bilateral) (R>L) w/o sciatica 12/15/2019   Lumbar stenosis with neurogenic claudication 12/15/2019   Memory loss, short term 10/09/2019   Chronic obstructive pulmonary disease ([place]) 09/18/2018   Morbid obesity with BMI of 40.0-44.9, adult ([place]) 04/03/2018   Hyperlipidemia associated with type 2 diabetes mellitus ([place]) 02/05/2018   Prostate cancer ([place]) 09/08/2017   Gastroesophageal reflux disease without esophagitis 07/29/2017   Other dysphagia 07/29/2017   Acute cerebrovascular accident (CVA) ([place]) 01/30/2016   Chronic kidney disease 07/14/2014   Clinical depression 07/14/2014   Gait instability 05/05/2014   Arthritis of knee 07/03/2013   Arteriosclerosis of coronary artery 07/03/2013   Type 2 diabetes mellitus with diabetic neuropathy, without long-term current use of insulin ([place]) 07/03/2013   Benign essential HTN 07/03/2013   HLD (hyperlipidemia) 07/03/2013   Breath shortness 07/03/2013   Atherosclerotic heart disease of native coronary artery without angina pectoris 07/03/2013   Sleep apnea 06/18/2013   History of Barrett's esophagus 01/08/2001   PCP:  Juluis Pitch, MD Pharmacy:   [place], [place] - [place] [address] [place] [address] Phone:  [phone] Fax: [phone]  [facility] [place] [place] [address] Phone: [phone] Fax: [phone]  [facility] [place], [place] [address] Phone: [phone] Fax: [phone]     Social Determinants of Health (SDOH) Interventions    Readmission Risk Interventions     No data to display

## 2021-09-11 NOTE — CV Procedure (Signed)
Request for Holter monitor was made Fortunately the hospital is unable to provide someone to place the Holter Because it is holiday there is no one in the office to provide 1 as well Advised team to arrange to have him come to the office tomorrow morning and we can place it at that time At [facility] clinic cardiology  Thanks,  Lujean Amel MD Cardiology

## 2021-09-11 NOTE — Discharge Summary (Signed)
Physician Discharge Summary  Taylor Austin OHY:[id number] DOB: April 20, 1940 DOA: 09/09/2021  PCP: Juluis Pitch, MD  Admit date: 09/09/2021 Discharge date: 09/11/2021  Admitted From: Home Disposition:  Home  Discharge Condition:Stable CODE STATUS:FULL Diet recommendation: Heart Healthy   Brief/Interim Summary: Taylor Austin is a 81 y.o. male with medical history significant for Chronic Mild Dementia (follows with Dr. Manuella Ghazi), HTN, HL, DM2, 07/24/2021 Left Occipital Stroke s /p Tenecteplase with residual right sided deficits, and history of 2018 stroke (patient is on Aspirin and Plavix), and OSA (on CPAP nightly) who presented to the ED on 9/2 with a new left sided facial droop and mild left upper extremity weakness.  MRI shows new small acute stroke of the right corona radiata.  Patient had two ischemic strokes in 6 weeks while on DAPT, which is concerning for embolic stroke. Contacted cardiology who recommended to follow-up in the office tomorrow for Holter monitoring.  Neurology not recommending any change in the stroke management.  Medically stable for discharge.PT/OT recommended home health.  Following problems were addressed during her hospitalization:  Acute CVA s/p 07/24/2021 Left Occipital Stroke with Right Sided Deficits: Patient presents with new mild left sided facial droop and left upper extremity weakness.  Symptoms have nearly resolved. - Continue Aspirin, Plavix, and Statin. - 07/2021 TTE reviewed.  There is no need to order another Echo. - Head CT and CTA showed no abnormality.  MRI of the brain showed a new stroke of the right corona radiata.  Neurology believes this is an extension of his recent stroke.  There are suspicions of an embolic stroke though as patient has been on DAPT for 33 days and developed a new symptomatic ischemic event - so he will need a Holter on discharge. - There have been no events on telemetry x 24 hours.  Contacted  Cardiology to organize Holter monitor  outpatient, recommended to follow-up tomorrow - PT/OT recommends Home Health PT/OT services.  - Follow up with Neurology Dr. Manuella Ghazi outpatient.    Chronic Dementia: Mental status is at baseline. - Continue home Donepezil 10 mg nightly.   Hypertension: BP is stable. - Resume home BP meds   Hyperlipidemia: - Atorvastatin 40 mg daily.   Diabetes Mellitus Type 2: Glucose is stable. - Continue home meds   Grade 1 Diastolic Heart Failure: - Patient is euvolemic.   GERD: - PPI.   BPH / History of Prostate Cancer: - Tamsulosin and Finasteride.   Peripheral Neuropathy / Lumbosacral Radiculopathy at S1 / DDD: - Duloxetine 60 mg daily.   OSA - CPAP nightly.     Discharge Diagnoses:  Principal Problem:   Stroke-like symptoms    Discharge Instructions  Discharge Instructions     Diet - low sodium heart healthy   Complete by: As directed    Discharge instructions   Complete by: As directed    1)Please take your medications as instructed 2) follow-up with cardiology office tomorrow for Holter monitoring.  Name and number the provider has been attached.  Call for appointment 3)Follow up with your neurologist as an outpatient.  Follow-up with your PCP   Increase activity slowly   Complete by: As directed       Allergies as of 09/11/2021       Reactions   Hydrocodone Shortness Of Breath   Oxycodone-acetaminophen Shortness Of Breath, Swelling   Confirmed with wife- tongue swelling and SOB after oxycodone        Medication List     TAKE these  medications    aspirin EC 81 MG tablet Take 81 mg by mouth every evening.   aspirin-acetaminophen-caffeine 250-250-65 MG tablet Commonly known as: EXCEDRIN MIGRAINE Take 1-2 tablets by mouth every 6 (six) hours as needed for headache.   atorvastatin 80 MG tablet Commonly known as: LIPITOR Take 0.5 tablets (40 mg total) by mouth every evening.   calcium carbonate 1500 (600 Ca) MG Tabs tablet Commonly known as: OSCAL Take  600 mg of elemental calcium by mouth daily with lunch.   clopidogrel 75 MG tablet Commonly known as: PLAVIX Take 1 tablet (75 mg total) by mouth daily.   cyanocobalamin 1000 MCG tablet Commonly known as: VITAMIN B12 Take 1,000 mcg by mouth daily with lunch.   donepezil 10 MG tablet Commonly known as: ARICEPT Take 10 mg by mouth every evening.   DULoxetine 60 MG capsule Commonly known as: CYMBALTA Take 60 mg by mouth daily with supper.   finasteride 5 MG tablet Commonly known as: PROSCAR TAKE 1 TABLET EVERY DAY   gabapentin 100 MG capsule Commonly known as: NEURONTIN Take 100 mg by mouth 5 (five) times daily.   glimepiride 4 MG tablet Commonly known as: AMARYL Take 4 mg by mouth in the morning.   losartan 100 MG tablet Commonly known as: COZAAR Take 50 mg by mouth daily.   magnesium oxide 400 MG tablet Commonly known as: MAG-OX Take 400 mg by mouth daily with lunch.   metFORMIN 500 MG 24 hr tablet Commonly known as: GLUCOPHAGE-XR Take 1,000 mg by mouth 2 (two) times daily with a meal.   Multi-Vitamins Tabs Take 1 tablet by mouth daily with lunch.   oxymetazoline 0.05 % nasal spray Commonly known as: AFRIN Place 1 spray into both nostrils 2 (two) times daily as needed for congestion.   pantoprazole 40 MG tablet Commonly known as: PROTONIX Take 40 mg by mouth daily before breakfast.   pioglitazone 15 MG tablet Commonly known as: ACTOS Take 45 mg by mouth every evening.   sildenafil 20 MG tablet Commonly known as: REVATIO 1-5 tablets as needed one hour prior to intercourse   tamsulosin 0.4 MG Caps capsule Commonly known as: FLOMAX Take 1 capsule (0.4 mg total) by mouth daily after supper.   Tiotropium Bromide Monohydrate 2.5 MCG/ACT Aers Inhale 2 each into the lungs at bedtime.   Xultophy 100-3.6 UNIT-MG/ML Sopn Generic drug: Insulin Degludec-Liraglutide Inject 50 Units into the skin daily. (Only use if blood glucose is >70)        Follow-up  Information     Care, [facility] Follow up.   Specialty: Home Health Services Why: Someone fro [facility] office will contact you to arrange start date and time for your therapy. Contact information: [place] [address] [phone]         Juluis Pitch, MD. Schedule an appointment as soon as possible for a visit in 1 week(s).   Specialty: Family Medicine Contact information: [place] [address] [phone]         Yolonda Kida, MD Follow up.   Specialties: Cardiology, Internal Medicine Why: Make an appointment tomorrow for Holter Monitoring Contact information: [place] [address] [phone]                Allergies  Allergen Reactions   Hydrocodone Shortness Of Breath   Oxycodone-Acetaminophen Shortness Of Breath and Swelling    Confirmed with wife- tongue swelling and SOB after  oxycodone    Consultations: Neurology   Procedures/Studies: MR BRAIN WO CONTRAST  Result Date: 09/09/2021 CLINICAL DATA:  Neuro deficit, acute, stroke suspected. Left-sided weakness. EXAM: MRI HEAD WITHOUT CONTRAST TECHNIQUE: Multiplanar, multiecho pulse sequences of the brain and surrounding structures were obtained without intravenous contrast. COMPARISON:  Head CT and CTA 09/09/2021.  Head MRI 07/24/2021. FINDINGS: Brain: There is an acute lacunar infarct in the right corona radiata. There has been interval evolution of the left PCA infarct since July with development of encephalomalacia, and a small amount of chronic blood products are noted. T2 hyperintensities elsewhere in the cerebral white matter bilaterally are unchanged and nonspecific but compatible with chronic small vessel ischemic disease with multiple chronic infarcts again noted in the left greater than right cerebral hemispheric white matter. There is mild cerebral atrophy. No mass, midline shift, or extra-axial fluid  collection is identified. Vascular: Major intracranial vascular flow voids are preserved. Skull and upper cervical spine: Unremarkable bone marrow signal para Sinuses/Orbits: Bilateral cataract extraction. Trace bilateral mastoid fluid. Clear paranasal sinuses. Other: None. IMPRESSION: 1. Acute lacunar infarct in the right corona radiata. 2. Chronic small vessel ischemic disease with multiple chronic infarcts as above. Electronically Signed   By: Logan Bores M.D.   On: 09/09/2021 17:10   CT ANGIO HEAD NECK W WO CM W PERF  Result Date: 09/09/2021 CLINICAL DATA:  Neuro deficit, acute, stroke suspected. Left-sided weakness and facial droop. EXAM: CT ANGIOGRAPHY HEAD AND NECK TECHNIQUE: Multidetector CT imaging of the head and neck was performed using the standard protocol during bolus administration of intravenous contrast. Multiplanar CT image reconstructions and MIPs were obtained to evaluate the vascular anatomy. Carotid stenosis measurements (when applicable) are obtained utilizing NASCET criteria, using the distal internal carotid diameter as the denominator. RADIATION DOSE REDUCTION: This exam was performed according to the departmental dose-optimization program which includes automated exposure control, adjustment of the mA and/or kV according to patient size and/or use of iterative reconstruction technique. CONTRAST:  71m OMNIPAQUE IOHEXOL 350 MG/ML SOLN COMPARISON:  Head and neck CTA 07/23/2021 FINDINGS: CTA NECK FINDINGS Aortic arch: Standard 3 vessel aortic arch with mild atherosclerotic plaque. No arch vessel origin stenosis. Right carotid system: Patent with a moderate amount of calcified plaque at the carotid bifurcation and in the proximal ICA. No evidence of a significant stenosis or dissection. Left carotid system: Patent with a moderate amount of calcified plaque at the carotid bifurcation and in the proximal ICA. No evidence of a significant stenosis or dissection. Vertebral arteries: Patent  with dense atherosclerotic calcification at the origins of both vertebral arteries resulting in likely severe stenosis on the right and mild stenosis on the left, unchanged. Skeleton: Solid C5-C7 ACDF. Advanced upper cervical facet arthrosis with minimal anterolisthesis of C3 on C4 and C4 on C5. Other neck: No evidence of cervical lymphadenopathy or mass. Upper chest: Clear lung apices. Review of the MIP images confirms the above findings CTA HEAD FINDINGS Anterior circulation: The internal carotid arteries are patent from skull base to carotid termini with mild atherosclerotic plaque bilaterally not resulting in significant stenosis. ACAs and MCAs are patent without evidence of a proximal branch occlusion or significant proximal stenosis. Diffusely attenuated appearance of the branch vessels on today's study compared to the prior CTA is likely technical. No aneurysm is identified. Posterior circulation: The intracranial vertebral arteries are patent to the basilar with the left being mildly dominant. Patent PICA and AICA origins are seen bilaterally. The basilar artery is widely patent with  a suspected small incidental fenestration proximally. There are small right and diminutive or absent left posterior communicating arteries. Both PCAs are patent without evidence of a significant proximal stenosis on the left. There is a moderate right P1 stenosis which is new or increased compared to the prior study. No aneurysm is identified. Venous sinuses: As permitted by contrast timing, patent. Anatomic variants: None. Review of the MIP images confirms the above findings These results were communicated to Dr. Quinn Axe at 1:24 pm on 09/09/2021 by text page via the [facility] messaging system. IMPRESSION: 1. No large vessel occlusion. 2. New or increased moderate right P1 stenosis. 3. Unchanged severe right and mild left vertebral artery origin stenoses. 4. Cervical carotid artery atherosclerosis without significant stenosis. 5. Aortic  Atherosclerosis (ICD10-I70.0). Electronically Signed   By: Logan Bores M.D.   On: 09/09/2021 13:35   CT HEAD CODE STROKE WO CONTRAST  Addendum Date: 09/09/2021   ADDENDUM REPORT: 09/09/2021 12:15 ADDENDUM: Study discussed by telephone with Dr. Marjean Donna on 09/09/2021 at 1203 hours. Electronically Signed   By: Genevie Ann M.D.   On: 09/09/2021 12:15   Result Date: 09/09/2021 CLINICAL DATA:  Code stroke. 81 year old male with left side weakness. Status post left PCA territory infarct in July. EXAM: CT HEAD WITHOUT CONTRAST TECHNIQUE: Contiguous axial images were obtained from the base of the skull through the vertex without intravenous contrast. RADIATION DOSE REDUCTION: This exam was performed according to the departmental dose-optimization program which includes automated exposure control, adjustment of the mA and/or kV according to patient size and/or use of iterative reconstruction technique. COMPARISON:  Brain MRI and head CT 07/24/2021. FINDINGS: Brain: Expected evolution of patchy left PCA territory encephalomalacia since July. Superimposed asymmetric cystic areas of cerebral white matter encephalomalacia more pronounced in the left hemisphere, also stable. No midline shift, ventriculomegaly, mass effect, evidence of mass lesion, intracranial hemorrhage or evidence of cortically based acute infarction. Vascular: Calcified atherosclerosis at the skull base. No suspicious intracranial vascular hyperdensity. Skull: No acute osseous abnormality identified. Sinuses/Orbits: Visualized paranasal sinuses and mastoids are stable and well aerated. Other: No acute orbit or scalp soft tissue finding. ASPECTS [facility] Stroke Program Early CT Score) Total score (0-10 with 10 being normal): 10 IMPRESSION: 1. No acute cortically based infarct or intracranial hemorrhage identified. ASPECTS 10. 2. Expected evolution of the Left PCA territory infarct since July. Chronic white matter disease. Electronically Signed: By: Genevie Ann  M.D. On: 09/09/2021 12:00      Subjective: Patient seen and examined the bedside this morning.  Hemodynamically stable.  Stable for discharge today.  Discharge planning discussed with the wife at bedside  Discharge Exam: Vitals:   09/11/21 0535 09/11/21 0721  BP: (!) 162/82 (!) 143/100  Pulse: 60 67  Resp: 20 15  Temp:  97.7 F (36.5 C)  SpO2: 97% 98%   Vitals:   09/10/21 2025 09/11/21 0056 09/11/21 0535 09/11/21 0721  BP: (!) 128/58 (!) 142/67 (!) 162/82 (!) 143/100  Pulse: 78 (!) 58 60 67  Resp: '16 20 20 15  '$ Temp: 98 F (36.7 C)   97.7 F (36.5 C)  TempSrc: Oral     SpO2: 97% 94% 97% 98%  Weight:      Height:        General: Pt is alert, awake, not in acute distress Cardiovascular: RRR, S1/S2 +, no rubs, no gallops Respiratory: CTA bilaterally, no wheezing, no rhonchi Abdominal: Soft, NT, ND, bowel sounds + Extremities: no edema, no cyanosis  The results of significant diagnostics from this hospitalization (including imaging, microbiology, ancillary and laboratory) are listed below for reference.     Microbiology: Recent Results (from the past 240 hour(s))  SARS Coronavirus 2 by RT PCR (hospital order, performed in [facility] hospital lab) *cepheid single result test* Anterior Nasal Swab     Status: None   Collection Time: 09/09/21  2:22 PM   Specimen: Anterior Nasal Swab  Result Value Ref Range Status   SARS Coronavirus 2 by RT PCR NEGATIVE NEGATIVE Final    Comment: (NOTE) SARS-CoV-2 target nucleic acids are NOT DETECTED.  The SARS-CoV-2 RNA is generally detectable in upper and lower respiratory specimens during the acute phase of infection. The lowest concentration of SARS-CoV-2 viral copies this assay can detect is 250 copies / mL. A negative result does not preclude SARS-CoV-2 infection and should not be used as the sole basis for treatment or other patient management decisions.  A negative result may occur with improper specimen collection /  handling, submission of specimen other than nasopharyngeal swab, presence of viral mutation(s) within the areas targeted by this assay, and inadequate number of viral copies (<250 copies / mL). A negative result must be combined with clinical observations, patient history, and epidemiological information.  Fact Sheet for Patients:   https://www.patel.info/  Fact Sheet for Healthcare Providers: https://hall.com/  This test is not yet approved or  cleared by the [place] FDA and has been authorized for detection and/or diagnosis of SARS-CoV-2 by FDA under an Emergency Use Authorization (EUA).  This EUA will remain in effect (meaning this test can be used) for the duration of the COVID-19 declaration under Section 564(b)(1) of the Act, 21 U.S.C. section 360bbb-3(b)(1), unless the authorization is terminated or revoked sooner.  Performed at [facility], [address] [place], [place] [address]      Labs: BNP (last 3 results) No results for input(s): "BNP" in the last 8760 hours. Basic Metabolic Panel: Recent Labs  Lab 09/09/21 1140 09/10/21 0346 09/11/21 0449  NA 137 134* 133*  K 4.5 3.9 4.0  CL 104 100 99  CO2 '27 25 27  '$ GLUCOSE 137* 124* 141*  BUN '13 12 18  '$ CREATININE 0.69 0.75 0.93  CALCIUM 8.8* 8.7* 8.6*   Liver Function Tests: Recent Labs  Lab 09/09/21 1140  AST 20  ALT 18  ALKPHOS 55  BILITOT 0.7  PROT 6.3*  ALBUMIN 3.6   No results for input(s): "LIPASE", "AMYLASE" in the last 168 hours. No results for input(s): "AMMONIA" in the last 168 hours. CBC: Recent Labs  Lab 09/09/21 1140 09/10/21 0346 09/11/21 0449  WBC 3.9* 4.3 3.6*  NEUTROABS 2.3  --   --   HGB 10.9* 10.6* 11.4*  HCT 34.9* 32.9* 35.1*  MCV 93.8 91.1 90.0  PLT 165 155 169   Cardiac Enzymes: No results for input(s): "CKTOTAL", "CKMB", "CKMBINDEX", "TROPONINI" in the last 168 hours. BNP: Invalid input(s):  "POCBNP" CBG: Recent Labs  Lab 09/10/21 0809 09/10/21 1149 09/10/21 1611 09/10/21 2019 09/11/21 0805  GLUCAP 156* 181* 115* 183* 131*   D-Dimer No results for input(s): "DDIMER" in the last 72 hours. Hgb A1c No results for input(s): "HGBA1C" in the last 72 hours. Lipid Profile Recent Labs    09/10/21 0346  CHOL 128  HDL 45  LDLCALC 51  TRIG 162*  CHOLHDL 2.8   Thyroid function studies No results for input(s): "TSH", "T4TOTAL", "T3FREE", "THYROIDAB" in the last 72 hours.  Invalid input(s): "FREET3"  Anemia work up No results for input(s): "VITAMINB12", "FOLATE", "FERRITIN", "TIBC", "IRON", "RETICCTPCT" in the last 72 hours. Urinalysis    Component Value Date/Time   COLORURINE Yellow 01/04/2014 1645   APPEARANCEUR Clear 11/22/2020 1438   LABSPEC 1.054 01/04/2014 1645   PHURINE 6.0 01/04/2014 1645   GLUCOSEU 3+ (A) 11/22/2020 1438   GLUCOSEU Negative 01/04/2014 1645   HGBUR Negative 01/04/2014 1645   BILIRUBINUR Negative 11/22/2020 1438   BILIRUBINUR Negative 01/04/2014 1645   KETONESUR Negative 01/04/2014 1645   PROTEINUR Negative 11/22/2020 1438   PROTEINUR Negative 01/04/2014 1645   NITRITE Negative 11/22/2020 1438   NITRITE Negative 01/04/2014 1645   LEUKOCYTESUR Negative 11/22/2020 1438   LEUKOCYTESUR Negative 01/04/2014 1645   Sepsis Labs Recent Labs  Lab 09/09/21 1140 09/10/21 0346 09/11/21 0449  WBC 3.9* 4.3 3.6*   Microbiology Recent Results (from the past 240 hour(s))  SARS Coronavirus 2 by RT PCR (hospital order, performed in [place] hospital lab) *cepheid single result test* Anterior Nasal Swab     Status: None   Collection Time: 09/09/21  2:22 PM   Specimen: Anterior Nasal Swab  Result Value Ref Range Status   SARS Coronavirus 2 by RT PCR NEGATIVE NEGATIVE Final    Comment: (NOTE) SARS-CoV-2 target nucleic acids are NOT DETECTED.  The SARS-CoV-2 RNA is generally detectable in upper and lower respiratory specimens during the acute  phase of infection. The lowest concentration of SARS-CoV-2 viral copies this assay can detect is 250 copies / mL. A negative result does not preclude SARS-CoV-2 infection and should not be used as the sole basis for treatment or other patient management decisions.  A negative result may occur with improper specimen collection / handling, submission of specimen other than nasopharyngeal swab, presence of viral mutation(s) within the areas targeted by this assay, and inadequate number of viral copies (<250 copies / mL). A negative result must be combined with clinical observations, patient history, and epidemiological information.  Fact Sheet for Patients:   https://www.patel.info/  Fact Sheet for Healthcare Providers: https://hall.com/  This test is not yet approved or  cleared by the [place] FDA and has been authorized for detection and/or diagnosis of SARS-CoV-2 by FDA under an Emergency Use Authorization (EUA).  This EUA will remain in effect (meaning this test can be used) for the duration of the COVID-19 declaration under Section 564(b)(1) of the Act, 21 U.S.C. section 360bbb-3(b)(1), unless the authorization is terminated or revoked sooner.  Performed at [facility], [address], [place], [place] [address]     Please note: You were cared for by a hospitalist during your hospital stay. Once you are discharged, your primary care physician will handle any further medical issues. Please note that NO REFILLS for any discharge medications will be authorized once you are discharged, as it is imperative that you return to your primary care physician (or establish a relationship with a primary care physician if you do not have one) for your post hospital discharge needs so that they can reassess your need for medications and monitor your lab values.    Time coordinating discharge: 40 minutes  SIGNED:   Shelly Coss,  MD  [facility] 09/11/2021, 10:20 AM Pager [phone]  If 7PM-7AM, please contact night-coverage [email or web address] Password [email or web address]

## 2021-09-11 NOTE — Progress Notes (Signed)
Taylor Austin to be D/C'd  per MD order.  Discussed with the patient and all questions fully answered.  VSS, Skin clean, dry and intact without evidence of skin break down, no evidence of skin tears noted.  IV catheter discontinued intact. Site without signs and symptoms of complications. Dressing and pressure applied.  An After Visit Summary was printed and given to the patient. Patient received prescription.  D/c education completed with patient/family including follow up instructions, medication list, d/c activities limitations if indicated, with other d/c instructions as indicated by MD - patient able to verbalize understanding, all questions fully answered.   Patient instructed to return to ED, call 911, or call MD for any changes in condition.   Patient to be escorted via [place], and D/C home via private auto.

## 2021-09-12 ENCOUNTER — Telehealth: Payer: Self-pay | Admitting: *Deleted

## 2021-09-12 NOTE — Telephone Encounter (Signed)
-----   Message from [place], PA-C sent at 09/11/2021  7:36 AM EDT ----- Regarding: heart monitor Patient needs 30 day heart monitor for stroke and 6-8 week follow-up.

## 2021-09-12 NOTE — Telephone Encounter (Signed)
Cadence, please clarify if we are placing this monitor?  In reviewing his chart Dr. Clayborn Bigness put a message in there yesterday stating: Request for Holter monitor was made Fortunately the hospital is unable to provide someone to place the Holter Because it is holiday there is no one in the office to provide 1 as well Advised team to arrange to have him come to the office tomorrow morning and we can place it at that time At [facility] clinic cardiology   Thanks,   Lujean Amel MD Cardiology        Electronically signed by Yolonda Kida, MD at 09/11/2021  9:32 AM

## 2021-09-14 DIAGNOSIS — I639 Cerebral infarction, unspecified: Secondary | ICD-10-CM | POA: Diagnosis not present

## 2021-09-14 DIAGNOSIS — I6340 Cerebral infarction due to embolism of unspecified cerebral artery: Secondary | ICD-10-CM | POA: Diagnosis not present

## 2021-09-14 DIAGNOSIS — I6523 Occlusion and stenosis of bilateral carotid arteries: Secondary | ICD-10-CM | POA: Diagnosis not present

## 2021-09-14 DIAGNOSIS — I2510 Atherosclerotic heart disease of native coronary artery without angina pectoris: Secondary | ICD-10-CM | POA: Diagnosis not present

## 2021-09-14 DIAGNOSIS — E782 Mixed hyperlipidemia: Secondary | ICD-10-CM | POA: Diagnosis not present

## 2021-09-14 DIAGNOSIS — I10 Essential (primary) hypertension: Secondary | ICD-10-CM | POA: Diagnosis not present

## 2021-09-14 DIAGNOSIS — I700 Atherosclerosis of aorta: Secondary | ICD-10-CM | POA: Diagnosis not present

## 2021-09-14 DIAGNOSIS — I5032 Chronic diastolic (congestive) heart failure: Secondary | ICD-10-CM | POA: Diagnosis not present

## 2021-09-14 DIAGNOSIS — Z955 Presence of coronary angioplasty implant and graft: Secondary | ICD-10-CM | POA: Diagnosis not present

## 2021-09-20 DIAGNOSIS — Z8673 Personal history of transient ischemic attack (TIA), and cerebral infarction without residual deficits: Secondary | ICD-10-CM | POA: Diagnosis not present

## 2021-09-20 DIAGNOSIS — I10 Essential (primary) hypertension: Secondary | ICD-10-CM | POA: Diagnosis not present

## 2021-09-20 DIAGNOSIS — E1140 Type 2 diabetes mellitus with diabetic neuropathy, unspecified: Secondary | ICD-10-CM | POA: Diagnosis not present

## 2021-09-20 DIAGNOSIS — F0390 Unspecified dementia without behavioral disturbance: Secondary | ICD-10-CM | POA: Diagnosis not present

## 2021-09-20 NOTE — Telephone Encounter (Signed)
I left a message for [facility] Cardiology to please call back to confirm if they placed a holter monitor on this patient.

## 2021-09-25 DIAGNOSIS — I639 Cerebral infarction, unspecified: Secondary | ICD-10-CM | POA: Diagnosis not present

## 2021-09-25 NOTE — Telephone Encounter (Signed)
Reviewed CareEverywhere. The patient was seen by Dr. Clayborn Bigness at [facility] Cardiology on 09/14/21.  Closing this encounter.

## 2021-09-26 DIAGNOSIS — M25462 Effusion, left knee: Secondary | ICD-10-CM | POA: Diagnosis not present

## 2021-09-26 DIAGNOSIS — G8929 Other chronic pain: Secondary | ICD-10-CM | POA: Diagnosis not present

## 2021-09-26 DIAGNOSIS — M1712 Unilateral primary osteoarthritis, left knee: Secondary | ICD-10-CM | POA: Diagnosis not present

## 2021-10-05 DIAGNOSIS — E119 Type 2 diabetes mellitus without complications: Secondary | ICD-10-CM | POA: Diagnosis not present

## 2021-10-06 DIAGNOSIS — F01A18 Vascular dementia, mild, with other behavioral disturbance: Secondary | ICD-10-CM | POA: Diagnosis not present

## 2021-10-06 DIAGNOSIS — I69398 Other sequelae of cerebral infarction: Secondary | ICD-10-CM | POA: Diagnosis not present

## 2021-10-06 DIAGNOSIS — H538 Other visual disturbances: Secondary | ICD-10-CM | POA: Diagnosis not present

## 2021-10-06 DIAGNOSIS — F02A18 Dementia in other diseases classified elsewhere, mild, with other behavioral disturbance: Secondary | ICD-10-CM | POA: Diagnosis not present

## 2021-10-06 DIAGNOSIS — R42 Dizziness and giddiness: Secondary | ICD-10-CM | POA: Diagnosis not present

## 2021-10-06 DIAGNOSIS — I69392 Facial weakness following cerebral infarction: Secondary | ICD-10-CM | POA: Diagnosis not present

## 2021-10-06 DIAGNOSIS — R519 Headache, unspecified: Secondary | ICD-10-CM | POA: Diagnosis not present

## 2021-10-06 DIAGNOSIS — R29898 Other symptoms and signs involving the musculoskeletal system: Secondary | ICD-10-CM | POA: Diagnosis not present

## 2021-10-06 DIAGNOSIS — G301 Alzheimer's disease with late onset: Secondary | ICD-10-CM | POA: Diagnosis not present

## 2021-10-19 DIAGNOSIS — E1159 Type 2 diabetes mellitus with other circulatory complications: Secondary | ICD-10-CM | POA: Diagnosis not present

## 2021-10-19 DIAGNOSIS — E1140 Type 2 diabetes mellitus with diabetic neuropathy, unspecified: Secondary | ICD-10-CM | POA: Diagnosis not present

## 2021-10-19 DIAGNOSIS — I152 Hypertension secondary to endocrine disorders: Secondary | ICD-10-CM | POA: Diagnosis not present

## 2021-10-25 DIAGNOSIS — E1159 Type 2 diabetes mellitus with other circulatory complications: Secondary | ICD-10-CM | POA: Diagnosis not present

## 2021-10-25 DIAGNOSIS — I152 Hypertension secondary to endocrine disorders: Secondary | ICD-10-CM | POA: Diagnosis not present

## 2021-10-25 DIAGNOSIS — E1169 Type 2 diabetes mellitus with other specified complication: Secondary | ICD-10-CM | POA: Diagnosis not present

## 2021-10-25 DIAGNOSIS — E669 Obesity, unspecified: Secondary | ICD-10-CM | POA: Diagnosis not present

## 2021-10-25 DIAGNOSIS — E785 Hyperlipidemia, unspecified: Secondary | ICD-10-CM | POA: Diagnosis not present

## 2021-10-25 DIAGNOSIS — E1140 Type 2 diabetes mellitus with diabetic neuropathy, unspecified: Secondary | ICD-10-CM | POA: Diagnosis not present

## 2021-10-30 DIAGNOSIS — J449 Chronic obstructive pulmonary disease, unspecified: Secondary | ICD-10-CM | POA: Diagnosis not present

## 2021-10-30 DIAGNOSIS — Z23 Encounter for immunization: Secondary | ICD-10-CM | POA: Diagnosis not present

## 2021-10-30 DIAGNOSIS — G4733 Obstructive sleep apnea (adult) (pediatric): Secondary | ICD-10-CM | POA: Diagnosis not present

## 2021-10-30 DIAGNOSIS — G4710 Hypersomnia, unspecified: Secondary | ICD-10-CM | POA: Diagnosis not present

## 2021-10-30 DIAGNOSIS — E663 Overweight: Secondary | ICD-10-CM | POA: Diagnosis not present

## 2021-10-31 DIAGNOSIS — Z955 Presence of coronary angioplasty implant and graft: Secondary | ICD-10-CM | POA: Diagnosis not present

## 2021-10-31 DIAGNOSIS — I2510 Atherosclerotic heart disease of native coronary artery without angina pectoris: Secondary | ICD-10-CM | POA: Diagnosis not present

## 2021-10-31 DIAGNOSIS — I6523 Occlusion and stenosis of bilateral carotid arteries: Secondary | ICD-10-CM | POA: Diagnosis not present

## 2021-10-31 DIAGNOSIS — I700 Atherosclerosis of aorta: Secondary | ICD-10-CM | POA: Diagnosis not present

## 2021-10-31 DIAGNOSIS — I5032 Chronic diastolic (congestive) heart failure: Secondary | ICD-10-CM | POA: Diagnosis not present

## 2021-10-31 DIAGNOSIS — I10 Essential (primary) hypertension: Secondary | ICD-10-CM | POA: Diagnosis not present

## 2021-10-31 DIAGNOSIS — I6340 Cerebral infarction due to embolism of unspecified cerebral artery: Secondary | ICD-10-CM | POA: Diagnosis not present

## 2021-10-31 DIAGNOSIS — E782 Mixed hyperlipidemia: Secondary | ICD-10-CM | POA: Diagnosis not present

## 2021-10-31 DIAGNOSIS — I4729 Other ventricular tachycardia: Secondary | ICD-10-CM | POA: Diagnosis not present

## 2021-11-03 ENCOUNTER — Inpatient Hospital Stay: Payer: Medicare HMO | Attending: Radiation Oncology

## 2021-11-03 DIAGNOSIS — C61 Malignant neoplasm of prostate: Secondary | ICD-10-CM | POA: Diagnosis not present

## 2021-11-03 LAB — PSA: Prostatic Specific Antigen: <0.01 ng/mL (ref 0.00–4.00)

## 2021-11-06 DIAGNOSIS — N401 Enlarged prostate with lower urinary tract symptoms: Secondary | ICD-10-CM | POA: Diagnosis not present

## 2021-11-06 DIAGNOSIS — Z6841 Body Mass Index (BMI) 40.0 and over, adult: Secondary | ICD-10-CM | POA: Diagnosis not present

## 2021-11-06 DIAGNOSIS — M48062 Spinal stenosis, lumbar region with neurogenic claudication: Secondary | ICD-10-CM | POA: Diagnosis not present

## 2021-11-06 DIAGNOSIS — E1140 Type 2 diabetes mellitus with diabetic neuropathy, unspecified: Secondary | ICD-10-CM | POA: Diagnosis not present

## 2021-11-06 DIAGNOSIS — E1169 Type 2 diabetes mellitus with other specified complication: Secondary | ICD-10-CM | POA: Diagnosis not present

## 2021-11-06 DIAGNOSIS — F0390 Unspecified dementia without behavioral disturbance: Secondary | ICD-10-CM | POA: Diagnosis not present

## 2021-11-06 DIAGNOSIS — I10 Essential (primary) hypertension: Secondary | ICD-10-CM | POA: Diagnosis not present

## 2021-11-06 DIAGNOSIS — E785 Hyperlipidemia, unspecified: Secondary | ICD-10-CM | POA: Diagnosis not present

## 2021-11-10 ENCOUNTER — Ambulatory Visit: Payer: Medicare HMO | Admitting: Radiation Oncology

## 2021-11-13 ENCOUNTER — Ambulatory Visit
Admission: RE | Admit: 2021-11-13 | Discharge: 2021-11-13 | Disposition: A | Discharge status: Home / Self Care | Payer: Medicare HMO | Source: Ambulatory Visit | Attending: Radiation Oncology | Admitting: Radiation Oncology

## 2021-11-13 ENCOUNTER — Encounter: Payer: Self-pay | Admitting: Radiation Oncology

## 2021-11-13 VITALS — BP 124/57 | HR 87 | Temp 98.4°F | Resp 20 | Ht 65.0 in | Wt 269.4 lb

## 2021-11-13 DIAGNOSIS — R350 Frequency of micturition: Secondary | ICD-10-CM | POA: Diagnosis not present

## 2021-11-13 DIAGNOSIS — Z8673 Personal history of transient ischemic attack (TIA), and cerebral infarction without residual deficits: Secondary | ICD-10-CM | POA: Insufficient documentation

## 2021-11-13 DIAGNOSIS — Z923 Personal history of irradiation: Secondary | ICD-10-CM | POA: Diagnosis not present

## 2021-11-13 DIAGNOSIS — C61 Malignant neoplasm of prostate: Secondary | ICD-10-CM | POA: Diagnosis not present

## 2021-11-13 DIAGNOSIS — Z191 Hormone sensitive malignancy status: Secondary | ICD-10-CM | POA: Diagnosis not present

## 2021-11-13 NOTE — Progress Notes (Signed)
Radiation Oncology Follow up Note  Name: Taylor Austin   Date:   11/13/2021 MRN:  [id number] DOB: 08/18/1940    This 81 y.o. male presents to the clinic today for 4-1/2-year follow-up status post IMRT radiation therapy to his prostate for Gleason 7 adenocarcinoma.  REFERRING PROVIDER: Juluis Pitch, MD  HPI: Patient is a 81 year old male now out over 4 and half years having completed IMRT radiation therapy for Gleason 7 adenocarcinoma the prostate.  Seen today in routine follow-up he is doing well he has a host of other medical problems.  He does have some significant urinary frequency has been seeing Dr. Erlene Quan for 4 pelvic floor dysfunction.  His most recent PSA is less than 0.01.  Patient has had a acute lacunar infarct in the right corona.  COMPLICATIONS OF TREATMENT: none  FOLLOW UP COMPLIANCE: keeps appointments   PHYSICAL EXAM:  BP (!) 124/57 (BP Location: Right Arm, Patient Position: Sitting, Cuff Size: Normal)   Pulse 87   Temp 98.4 F (36.9 C) (Tympanic)   Resp 20   Ht '5\' 5"'$  (1.651 m)   Wt 269 lb 6.4 oz (122.2 kg)   BMI 44.83 kg/m  Well-developed obese male in NAD.  Well-developed well-nourished patient in NAD. HEENT reveals PERLA, EOMI, discs not visualized.  Oral cavity is clear. No oral mucosal lesions are identified. Neck is clear without evidence of cervical or supraclavicular adenopathy. Lungs are clear to A&P. Cardiac examination is essentially unremarkable with regular rate and rhythm without murmur rub or thrill. Abdomen is benign with no organomegaly or masses noted. Motor sensory and DTR levels are equal and symmetric in the upper and lower extremities. Cranial nerves II through XII are grossly intact. Proprioception is intact. No peripheral adenopathy or edema is identified. No motor or sensory levels are noted. Crude visual fields are within normal range.  RADIOLOGY RESULTS: No current films for review  PLAN: At the present time patient is under excellent  biochemical control of his prostate cancer after 4-1/2 years.  I am going to turn follow-up care over to Dr. Erlene Quan.  Be happy to reevaluate the patient anytime should that be indicated.  Patient comprehends my recommendations well.  I would like to take this opportunity to thank you for allowing me to participate in the care of your patient.Noreene Filbert, MD

## 2021-12-05 DIAGNOSIS — M545 Low back pain, unspecified: Secondary | ICD-10-CM | POA: Diagnosis not present

## 2021-12-05 DIAGNOSIS — M25512 Pain in left shoulder: Secondary | ICD-10-CM | POA: Diagnosis not present

## 2021-12-05 DIAGNOSIS — Z6841 Body Mass Index (BMI) 40.0 and over, adult: Secondary | ICD-10-CM | POA: Diagnosis not present

## 2021-12-05 DIAGNOSIS — M25511 Pain in right shoulder: Secondary | ICD-10-CM | POA: Diagnosis not present

## 2021-12-05 DIAGNOSIS — I69352 Hemiplegia and hemiparesis following cerebral infarction affecting left dominant side: Secondary | ICD-10-CM | POA: Diagnosis not present

## 2021-12-05 DIAGNOSIS — R2681 Unsteadiness on feet: Secondary | ICD-10-CM | POA: Diagnosis not present

## 2021-12-05 DIAGNOSIS — Z7689 Persons encountering health services in other specified circumstances: Secondary | ICD-10-CM | POA: Diagnosis not present

## 2021-12-05 DIAGNOSIS — R42 Dizziness and giddiness: Secondary | ICD-10-CM | POA: Diagnosis not present

## 2021-12-05 DIAGNOSIS — Z8673 Personal history of transient ischemic attack (TIA), and cerebral infarction without residual deficits: Secondary | ICD-10-CM | POA: Diagnosis not present

## 2021-12-05 DIAGNOSIS — G8929 Other chronic pain: Secondary | ICD-10-CM | POA: Diagnosis not present

## 2021-12-07 ENCOUNTER — Encounter: Payer: Self-pay | Admitting: Pain Medicine

## 2021-12-10 NOTE — Progress Notes (Unsigned)
PROVIDER NOTE: Information contained herein reflects review and annotations entered in association with encounter. Interpretation of such information and data should be left to medically-trained personnel. Information provided to patient can be located elsewhere in the medical record under "Patient Instructions". Document created using STT-dictation technology, any transcriptional errors that may result from process are unintentional.    Patient: Taylor Austin  Service Category: E/M  Provider: Gaspar Cola, MD  DOB: Aug 03, 1940  DOS: 12/11/2021  Referring Provider: Juluis Pitch, MD  MRN: [id number]  Specialty: Interventional Pain Management  PCP: Juluis Pitch, MD  Type: Established Patient  Setting: Ambulatory outpatient    Location: Office  Delivery: Face-to-face     HPI  Taylor Austin, a 81 y.o. year old male, is here today because of his No primary diagnosis found.. Taylor Austin primary complain today is No chief complaint on file. Last encounter: My last encounter with him was on 09/05/2021. Pertinent problems: Taylor Austin has Arthritis of knee; Gait instability; Chronic low back pain (1ry area of Pain) (Bilateral) (R>L) w/o sciatica; Lumbar stenosis with neurogenic claudication; Chronic pain syndrome; Abnormal MRI, cervical spine (11/14/2019); Abnormal MRI, lumbar spine (11/14/2019); History of lumbar laminectomy; History of fusion of cervical spine; DDD (degenerative disc disease), cervical; DDD (degenerative disc disease), lumbosacral; Lumbosacral facet arthropathy (Multilevel) (Bilateral); Cervical facet hypertrophy; DJD (degenerative joint disease) of cervical spine; Lumbar facet joint pain (Bilateral); Spondylosis without myelopathy or radiculopathy, lumbosacral region; Retrolisthesis of L2 over L3; and Lumbar facet syndrome on their pertinent problem list. Pain Assessment: Severity of   is reported as a  /10. Location:    / . Onset:  . Quality:  . Timing:  . Modifying  factor(s):  Taylor Austin Vitals:  vitals were not taken for this visit.  BMI: Estimated body mass index is 44.83 kg/m as calculated from the following:   Height as of 11/13/21: _0  (1.651 m).   Weight as of 11/13/21: 269 lb 6.4 oz (122.2 kg).  Reason for encounter:  *** . ***  Pharmacotherapy Assessment  Analgesic: None. No chronic opioid analgesics therapy prescribed by our practice. MME/day: 0 mg/day   Monitoring: [place] PMP: PDMP reviewed during this encounter.       Pharmacotherapy: No side-effects or adverse reactions reported. Compliance: No problems identified. Effectiveness: Clinically acceptable.  No notes on file  No results found for: "CBDTHCR" No results found for: "D8THCCBX" No results found for: "D9THCCBX"  UDS:  Summary  Date Value Ref Range Status  06/15/2020 Note  Final    Comment:    ==================================================================== Compliance Drug Analysis, Ur ==================================================================== Test                             Result       Flag       Units  Drug Present and Declared for Prescription Verification   Gabapentin                     PRESENT      EXPECTED   Duloxetine                     PRESENT      EXPECTED  Drug Present not Declared for Prescription Verification   Acetaminophen                  PRESENT      UNEXPECTED   Ibuprofen  PRESENT      UNEXPECTED   Diphenhydramine                PRESENT      UNEXPECTED   Lidocaine                      PRESENT      UNEXPECTED  Drug Absent but Declared for Prescription Verification   Salicylate                     Not Detected UNEXPECTED    Aspirin, as indicated in the declared medication list, is not always    detected even when used as directed.  ==================================================================== Test                      Result    Flag   Units      Ref Range   Creatinine              34               mg/dL       >=20 ==================================================================== Declared Medications:  The flagging and interpretation on this report are based on the  following declared medications.  Unexpected results may arise from  inaccuracies in the declared medications.   **Note: The testing scope of this panel includes these medications:   Duloxetine (Cymbalta)  Gabapentin (Neurontin)   **Note: The testing scope of this panel does not include small to  moderate amounts of these reported medications:   Aspirin   **Note: The testing scope of this panel does not include the  following reported medications:   Albuterol (Ventolin HFA)  Atorvastatin (Lipitor)  Calcium  Canagliflozin (Invokana)  Clopidogrel (Plavix)  Cyanocobalamin  Donepezil (Aricept)  Finasteride (Proscar)  Glimepiride (Amaryl)  Glyburide (Glucovance)  Losartan (Cozaar)  Magnesium (Mag-Ox)  Metformin (Glucovance)  Multivitamin  Oxymetazoline (Afrin)  Pantoprazole (Protonix)  Pioglitazone (Actos)  Probiotic  Sildenafil  Tamsulosin (Flomax)  Umeclidinium (Anoro)  Vilanterol (Anoro) ==================================================================== For clinical consultation, please call [phone]. ====================================================================       ROS  Constitutional: Denies any fever or chills Gastrointestinal: No reported hemesis, hematochezia, vomiting, or acute GI distress Musculoskeletal: Denies any acute onset joint swelling, redness, loss of ROM, or weakness Neurological: No reported episodes of acute onset apraxia, aphasia, dysarthria, agnosia, amnesia, paralysis, loss of coordination, or loss of consciousness  Medication Review  DULoxetine, Insulin Degludec-Liraglutide, Multi-Vitamins, Tiotropium Bromide Monohydrate, aspirin EC, aspirin-acetaminophen-caffeine, atorvastatin, calcium carbonate, clopidogrel, cyanocobalamin, donepezil, finasteride, gabapentin,  glimepiride, losartan, magnesium oxide, metFORMIN, oxymetazoline, pantoprazole, pioglitazone, sildenafil, and tamsulosin  History Review  Allergy: Taylor Austin is allergic to hydrocodone and oxycodone-acetaminophen. Drug: Mr. Malizia  reports no history of drug use. Alcohol:  reports current alcohol use of about 1.0 standard drink of alcohol per week. Tobacco:  reports that he quit smoking about 48 years ago. His smoking use included cigarettes. He has never used smokeless tobacco. Social: Mr. List  reports that he quit smoking about 48 years ago. His smoking use included cigarettes. He has never used smokeless tobacco. He reports current alcohol use of about 1.0 standard drink of alcohol per week. He reports that he does not use drugs. Medical:  has a past medical history of Allergy, Anemia, Arthritis, Back pain, Barrett esophagus, BPH (benign prostatic hyperplasia), Chronic kidney disease, COPD (chronic obstructive pulmonary disease) ([place]), Coronary artery disease, Depression, Diabetes mellitus without complication ([place]), Diverticulitis, Dysphagia, Esophageal reflux, Esophageal  reflux, GERD (gastroesophageal reflux disease), Heart murmur, Hyperlipidemia, Hypersomnia, Hypertension, Kidney stones, Low testosterone, OAB (overactive bladder), Obesity, Presence of dental bridge, Rheumatic fever, Sleep apnea, Stroke ([place]) (01/2016), Vertigo, and Vitamin B12 deficiency. Surgical: Mr. Lack  has a past surgical history that includes Lumbar spine surgery; Cervical spine surgery; Thoracic spine surgery; Colonoscopy; Esophagogastroduodenoscopy endoscopy; Carotid stent; Back surgery; Tonsillectomy; Coronary angioplasty; Cardiac catheterization (08/11/2014); Cardiac catheterization (N/A, 11/17/2014); Colonoscopy with propofol (N/A, 06/10/2015); Cataract extraction w/PHACO (Left, 04/03/2016); Cataract extraction w/PHACO (Right, 05/01/2016); Eye surgery; and Esophagogastroduodenoscopy (egd) with propofol (N/A,  06/28/2017). Family: family history includes Anesthesia problems in his father and mother; Prostate cancer in his brother; Stroke in his father.  Laboratory Chemistry Profile   Renal Lab Results  Component Value Date   BUN 18 09/11/2021   CREATININE 0.93 09/11/2021   GFRAA >60 02/02/2016   GFRNONAA >60 09/11/2021    Hepatic Lab Results  Component Value Date   AST 20 09/09/2021   ALT 18 09/09/2021   ALBUMIN 3.6 09/09/2021   ALKPHOS 55 09/09/2021    Electrolytes Lab Results  Component Value Date   NA 133 (L) 09/11/2021   K 4.0 09/11/2021   CL 99 09/11/2021   CALCIUM 8.6 (L) 09/11/2021   MG 1.9 07/26/2021   PHOS 4.3 07/26/2021    Bone Lab Results  Component Value Date   25OHVITD1 44 06/15/2020   25OHVITD2 <1.0 06/15/2020   25OHVITD3 43 06/15/2020    Inflammation (CRP: Acute Phase) (ESR: Chronic Phase) Lab Results  Component Value Date   CRP 0.6 06/15/2020   ESRSEDRATE 13 06/15/2020         Note: Above Lab results reviewed.  Recent Imaging Review  MR BRAIN WO CONTRAST CLINICAL DATA:  Neuro deficit, acute, stroke suspected. Left-sided weakness.  EXAM: MRI HEAD WITHOUT CONTRAST  TECHNIQUE: Multiplanar, multiecho pulse sequences of the brain and surrounding structures were obtained without intravenous contrast.  COMPARISON:  Head CT and CTA 09/09/2021.  Head MRI 07/24/2021.  FINDINGS: Brain: There is an acute lacunar infarct in the right corona radiata. There has been interval evolution of the left PCA infarct since July with development of encephalomalacia, and a small amount of chronic blood products are noted. T2 hyperintensities elsewhere in the cerebral white matter bilaterally are unchanged and nonspecific but compatible with chronic small vessel ischemic disease with multiple chronic infarcts again noted in the left greater than right cerebral hemispheric white matter. There is mild cerebral atrophy. No mass, midline shift, or extra-axial  fluid collection is identified.  Vascular: Major intracranial vascular flow voids are preserved.  Skull and upper cervical spine: Unremarkable bone marrow signal para  Sinuses/Orbits: Bilateral cataract extraction. Trace bilateral mastoid fluid. Clear paranasal sinuses.  Other: None.  IMPRESSION: 1. Acute lacunar infarct in the right corona radiata. 2. Chronic small vessel ischemic disease with multiple chronic infarcts as above.  Electronically Signed   By: Logan Bores M.D.   On: 09/09/2021 17:10 CT ANGIO HEAD NECK W WO CM W PERF CLINICAL DATA:  Neuro deficit, acute, stroke suspected. Left-sided weakness and facial droop.  EXAM: CT ANGIOGRAPHY HEAD AND NECK  TECHNIQUE: Multidetector CT imaging of the head and neck was performed using the standard protocol during bolus administration of intravenous contrast. Multiplanar CT image reconstructions and MIPs were obtained to evaluate the vascular anatomy. Carotid stenosis measurements (when applicable) are obtained utilizing NASCET criteria, using the distal internal carotid diameter as the denominator.  RADIATION DOSE REDUCTION: This exam was performed according to the departmental  dose-optimization program which includes automated exposure control, adjustment of the mA and/or kV according to patient size and/or use of iterative reconstruction technique.  CONTRAST:  68m OMNIPAQUE IOHEXOL 350 MG/ML SOLN  COMPARISON:  Head and neck CTA 07/23/2021  FINDINGS: CTA NECK FINDINGS  Aortic arch: Standard 3 vessel aortic arch with mild atherosclerotic plaque. No arch vessel origin stenosis.  Right carotid system: Patent with a moderate amount of calcified plaque at the carotid bifurcation and in the proximal ICA. No evidence of a significant stenosis or dissection.  Left carotid system: Patent with a moderate amount of calcified plaque at the carotid bifurcation and in the proximal ICA. No evidence of a significant  stenosis or dissection.  Vertebral arteries: Patent with dense atherosclerotic calcification at the origins of both vertebral arteries resulting in likely severe stenosis on the right and mild stenosis on the left, unchanged.  Skeleton: Solid C5-C7 ACDF. Advanced upper cervical facet arthrosis with minimal anterolisthesis of C3 on C4 and C4 on C5.  Other neck: No evidence of cervical lymphadenopathy or mass.  Upper chest: Clear lung apices.  Review of the MIP images confirms the above findings  CTA HEAD FINDINGS  Anterior circulation: The internal carotid arteries are patent from skull base to carotid termini with mild atherosclerotic plaque bilaterally not resulting in significant stenosis. ACAs and MCAs are patent without evidence of a proximal branch occlusion or significant proximal stenosis. Diffusely attenuated appearance of the branch vessels on today's study compared to the prior CTA is likely technical. No aneurysm is identified.  Posterior circulation: The intracranial vertebral arteries are patent to the basilar with the left being mildly dominant. Patent PICA and AICA origins are seen bilaterally. The basilar artery is widely patent with a suspected small incidental fenestration proximally. There are small right and diminutive or absent left posterior communicating arteries. Both PCAs are patent without evidence of a significant proximal stenosis on the left. There is a moderate right P1 stenosis which is new or increased compared to the prior study. No aneurysm is identified.  Venous sinuses: As permitted by contrast timing, patent.  Anatomic variants: None.  Review of the MIP images confirms the above findings  These results were communicated to Dr. SQuinn Axeat 1:24 pm on 09/09/2021 by text Taylor via the [facility] system.  IMPRESSION: 1. No large vessel occlusion. 2. New or increased moderate right P1 stenosis. 3. Unchanged severe right and mild left  vertebral artery origin stenoses. 4. Cervical carotid artery atherosclerosis without significant stenosis. 5. Aortic Atherosclerosis (ICD10-I70.0).  Electronically Signed   By: ALogan BoresM.D.   On: 09/09/2021 13:35 CT HEAD CODE STROKE WO CONTRAST Addendum: ADDENDUM REPORT: 09/09/2021 12:15   ADDENDUM:  Study discussed by telephone with Dr. MMarjean Donnaon 09/09/2021 at 1203  hours.   Electronically Signed    By: HGenevie AnnM.D.    On: 09/09/2021 12:15 Narrative: CLINICAL DATA:  Code stroke. 81year old male with left side weakness. Status post left PCA territory infarct in July.  EXAM: CT HEAD WITHOUT CONTRAST  TECHNIQUE: Contiguous axial images were obtained from the base of the skull through the vertex without intravenous contrast.  RADIATION DOSE REDUCTION: This exam was performed according to the departmental dose-optimization program which includes automated exposure control, adjustment of the mA and/or kV according to patient size and/or use of iterative reconstruction technique.  COMPARISON:  Brain MRI and head CT 07/24/2021.  FINDINGS: Brain: Expected evolution of patchy left PCA territory encephalomalacia since July. Superimposed asymmetric  cystic areas of cerebral white matter encephalomalacia more pronounced in the left hemisphere, also stable.  No midline shift, ventriculomegaly, mass effect, evidence of mass lesion, intracranial hemorrhage or evidence of cortically based acute infarction.  Vascular: Calcified atherosclerosis at the skull base. No suspicious intracranial vascular hyperdensity.  Skull: No acute osseous abnormality identified.  Sinuses/Orbits: Visualized paranasal sinuses and mastoids are stable and well aerated.  Other: No acute orbit or scalp soft tissue finding.  ASPECTS [facility] Stroke Program Early CT Score)  Total score (0-10 with 10 being normal): 10  IMPRESSION: 1. No acute cortically based infarct or intracranial  hemorrhage identified. ASPECTS 10. 2. Expected evolution of the Left PCA territory infarct since July. Chronic white matter disease.  Electronically Signed: By: Genevie Ann M.D. On: 09/09/2021 12:00 Note: Reviewed        Physical Exam  General appearance: Well nourished, well developed, and well hydrated. In no apparent acute distress Mental status: Alert, oriented x 3 (person, place, & time)       Respiratory: No evidence of acute respiratory distress Eyes: PERLA Vitals: There were no vitals taken for this visit. BMI: Estimated body mass index is 44.83 kg/m as calculated from the following:   Height as of 11/13/21: _0  (1.651 m).   Weight as of 11/13/21: 269 lb 6.4 oz (122.2 kg). Ideal: Patient weight not recorded  Assessment   Diagnosis Status  No diagnosis found. Controlled Controlled Controlled   Updated Problems: No problems updated.  Plan of Care  Problem-specific:  No problem-specific Assessment & Plan notes found for this encounter.  Mr. BLAISE GRIESHABER has a current medication list which includes the following long-term medication(s): atorvastatin, calcium carbonate, donepezil, duloxetine, gabapentin, glimepiride, losartan, pantoprazole, pioglitazone, and tiotropium bromide monohydrate.  Pharmacotherapy (Medications Ordered): No orders of the defined types were placed in this encounter.  Orders:  No orders of the defined types were placed in this encounter.  Follow-up plan:   No follow-ups on file.     Interventional Therapies  Risk  Complexity Considerations:   Estimated body mass index is 43.27 kg/m as calculated from the following:   Height as of this encounter: _1  (1.651 m).   Weight as of this encounter: 260 lb (117.9 kg).  PLAVIX Anticoagulation (Stop: 7-10 days  Restart: 2 hours)  2nd Ischemic Stroke on 07/23/2021. No procedures until after 01/23/2022.   Planned  Pending:   Therapeutic right lumbar facet RFA #1 (Hold until he recovers from  stroke)    Under consideration:   Therapeutic bilateral lumbar facet RFA #1 (Hold until he recovers from stroke)    Completed:   Diagnostic/therapeutic bilateral lumbar facet MBB x2 (07/18/2021) (2nd:100/100/50/50) (1st:100/100/100/100)    Completed by Girtha Hake, MD: ([facility] PM[facility]) Left L4 TFESI x1 (03/03/2020) (0/0/0/0) (however, currently having no left lower extremity pain)  Left L5 TFESI x2 (01/14/2020; 02/11/2020) (100/100/0/0) (however, currently having no left lower extremity pain)    Therapeutic  Palliative (PRN) options:   None established     Recent Visits No visits were found meeting these conditions. Showing recent visits within past 90 days and meeting all other requirements Future Appointments Date Type Provider Dept  12/11/21 Appointment Milinda Pointer, MD [facility]  Showing future appointments within next 90 days and meeting all other requirements  I discussed the assessment and treatment plan with the patient. The patient was provided an opportunity to ask questions and all were answered. The patient agreed with the plan and demonstrated  an understanding of the instructions.  Patient advised to call back or seek an in-person evaluation if the symptoms or condition worsens.  Duration of encounter: *** minutes.  Total time on encounter, as per AMA guidelines included both the face-to-face and non-face-to-face time personally spent by the physician and/or other qualified health care professional(s) on the day of the encounter (includes time in activities that require the physician or other qualified health care professional and does not include time in activities normally performed by clinical staff). Physician's time may include the following activities when performed: preparing to see the patient (eg, review of tests, pre-charting review of records) obtaining and/or reviewing separately obtained history performing a medically appropriate  examination and/or evaluation counseling and educating the patient/family/caregiver ordering medications, tests, or procedures referring and communicating with other health care professionals (when not separately reported) documenting clinical information in the electronic or other health record independently interpreting results (not separately reported) and communicating results to the patient/ family/caregiver care coordination (not separately reported)  Note by: Gaspar Cola, MD Date: 12/11/2021; Time: 3:21 PM

## 2021-12-11 ENCOUNTER — Encounter: Payer: Self-pay | Admitting: Pain Medicine

## 2021-12-11 ENCOUNTER — Ambulatory Visit: Payer: Medicare HMO | Attending: Pain Medicine | Admitting: Pain Medicine

## 2021-12-11 VITALS — BP 158/84 | HR 73 | Temp 97.2°F | Resp 18 | Ht 65.0 in | Wt 265.0 lb

## 2021-12-11 DIAGNOSIS — M47816 Spondylosis without myelopathy or radiculopathy, lumbar region: Secondary | ICD-10-CM | POA: Diagnosis not present

## 2021-12-11 DIAGNOSIS — Z7901 Long term (current) use of anticoagulants: Secondary | ICD-10-CM

## 2021-12-11 DIAGNOSIS — M5459 Other low back pain: Secondary | ICD-10-CM

## 2021-12-11 DIAGNOSIS — Z8673 Personal history of transient ischemic attack (TIA), and cerebral infarction without residual deficits: Secondary | ICD-10-CM | POA: Diagnosis not present

## 2021-12-11 DIAGNOSIS — G309 Alzheimer's disease, unspecified: Secondary | ICD-10-CM | POA: Insufficient documentation

## 2021-12-11 DIAGNOSIS — F0280 Dementia in other diseases classified elsewhere without behavioral disturbance: Secondary | ICD-10-CM | POA: Diagnosis not present

## 2021-12-11 DIAGNOSIS — F0150 Vascular dementia without behavioral disturbance: Secondary | ICD-10-CM | POA: Diagnosis not present

## 2021-12-11 DIAGNOSIS — I69315 Cognitive social or emotional deficit following cerebral infarction: Secondary | ICD-10-CM

## 2021-12-11 NOTE — Patient Instructions (Addendum)
Ask your primary care physician if the use of over-the-counter turmeric may be contraindicated, based on your particular medical conditions and medications.  The turmeric is a natural anti-inflammatory and may assist with the inflammation related pain.  For a more information on how diet can influence inflammation, please visit: [email or web address] NOTE: We are not associated or profit from this web site.   He is weight needs to be below 180 pounds in order for his BMI to be less than 30 kg/m.  This is based on a height of 5 feet 5 inches tall. ______________________________________________________________________________________________  Body mass index (BMI)  Body mass index (BMI) is a common tool for deciding whether a person has an appropriate body weight.  It measures a persons weight in relation to their height.   According to the [facility] of health (NIH): A BMI of less than 18.5 means that a person is underweight. A BMI of between 18.5 and 24.9 is ideal. A BMI of between 25 and 29.9 is overweight. A BMI over 30 indicates obesity.  Weight Management Required  URGENT: Your weight has been found to be adversely affecting your health.  Dear Taylor Austin:  Your current Estimated body mass index is 44.1 kg/m as calculated from the following:   Height as of this encounter: 5' 5" (1.651 m).   Weight as of this encounter: 265 lb (120.2 kg).  Please use the table below to identify your weight category and associated incidence of chronic pain, secondary to your weight.  Body Mass Index (BMI) Classification BMI level (kg/m2) Category Associated incidence of chronic pain  <18  Underweight   18.5-24.9 Ideal body weight   25-29.9 Overweight  20%  30-34.9 Obese (Class I)  68%  35-39.9 Severe obesity (Class II)  136%  >40 Extreme obesity (Class III)  254%   In addition: You will be considered "Morbidly Obese", if your BMI is above 30 and you have  one or more of the following conditions which are known to be caused and/or directly associated with obesity: 1.    Type 2 Diabetes (Which in turn can lead to cardiovascular diseases (CVD), stroke, peripheral vascular diseases (PVD), retinopathy, nephropathy, and neuropathy) 2.    Cardiovascular Disease (High Blood Pressure; Congestive Heart Failure; High Cholesterol; Coronary Artery Disease; Angina; or History of Heart Attacks) 3.    Breathing problems (Asthma; obesity-hypoventilation syndrome; obstructive sleep apnea; chronic inflammatory airway disease; reactive airway disease; or shortness of breath) 4.    Chronic kidney disease 5.    Liver disease (nonalcoholic fatty liver disease) 6.    High blood pressure 7.    Acid reflux (gastroesophageal reflux disease; heartburn) 8.    Osteoarthritis (OA) (with any of the following: hip pain; knee pain; and/or low back pain) 9.    Low back pain (Lumbar Facet Syndrome; and/or Degenerative Disc Disease) 10.  Hip pain (Osteoarthritis of hip) (For every 1 lbs of added body weight, there is a 2 lbs increase in pressure inside of each hip articulation. 1:2 mechanical relationship) 11.  Knee pain (Osteoarthritis of knee) (For every 1 lbs of added body weight, there is a 4 lbs increase in pressure inside of each knee articulation. 1:4 mechanical relationship) (patients with a BMI>30 kg/m2 were 6.8 times more likely to develop knee OA than normal-weight individuals) 12.  Cancer: Epidemiological studies have shown that obesity is a risk factor for: post-menopausal breast cancer; cancers of the endometrium, colon and kidney cancer; malignant adenomas of the oesophagus. Obese subjects have  an approximately 1.5-3.5-fold increased risk of developing these cancers compared with normal-weight subjects, and it has been estimated that between 15 and 45% of these cancers can be attributed to overweight. More recent studies suggest that obesity may also increase the risk of other  types of cancer, including pancreatic, hepatic and gallbladder cancer. (Ref: Obesity and cancer. Pischon T, Nthlings U, [employer] H. Proc Nutr Soc. 2008 May;67(2):128-45. doi: 70.3500/X3818299371696789.) The International Agency for Research on Cancer (IARC) has identified 13 cancers associated with overweight and obesity: meningioma, multiple myeloma, adenocarcinoma of the esophagus, and cancers of the thyroid, postmenopausal breast cancer, gallbladder, stomach, liver, pancreas, kidney, ovaries, uterus, colon and rectal (colorectal) cancers. 35 percent of all cancers diagnosed in women and 24 percent of those diagnosed in men are associated with overweight and obesity.  Recommendation: At this point it is urgent that you take a step back and concentrate in loosing weight. Dedicate 100% of your efforts on this task. Nothing else will improve your health more than bringing your weight down and your BMI to less than 30. If you are here, you probably have chronic pain. We know that most chronic pain patients have difficulty exercising secondary to their pain. For this reason, you must rely on proper nutrition and diet in order to lose the weight. If your BMI is above 40, you should seriously consider bariatric surgery. A realistic goal is to lose 10% of your body weight over a period of 12 months.  Be honest to yourself, if over time you have unsuccessfully tried to lose weight, then it is time for you to seek professional help and to enter a medically supervised weight management program, and/or undergo bariatric surgery. Stop procrastinating.   Pain management considerations:  1.    Pharmacological Problems: Be advised that the use of opioid analgesics (oxycodone; hydrocodone; morphine; methadone; codeine; and all of their derivatives) have been associated with decreased metabolism and weight gain.  For this reason, should we see that you are unable to lose weight while taking these medications, it may become  necessary for Taylor Austin to taper down and indefinitely discontinue them.  2.    Technical Problems: The incidence of successful interventional therapies decreases as the patient's BMI increases. It is much more difficult to accomplish a safe and effective interventional therapy on a patient with a BMI above 35. 3.    Radiation Exposure Problems: The x-rays machine, used to accomplish injection therapies, will automatically increase their x-ray output in order to capture an appropriate bone image. This means that radiation exposure increases exponentially with the patient's BMI. (The higher the BMI, the higher the radiation exposure.) Although the level of radiation used at a given time is still safe to the patient, it is not for the physician and/or assisting staff. Unfortunately, radiation exposure is accumulative. Because physicians and the staff have to do procedures and be exposed on a daily basis, this can result in health problems such as cancer and radiation burns. Radiation exposure to the staff is monitored by the radiation batches that they wear. The exposure levels are reported back to the staff on a quarterly basis. Depending on levels of exposure, physicians and staff may be obligated by law to decrease this exposure. This means that they have the right and obligation to refuse providing therapies where they may be overexposed to radiation. For this reason, physicians may decline to offer therapies such as radiofrequency ablation or implants to patients with a BMI above 40. 4.  Current Trends: Be advised that the current trend is to no longer offer certain therapies to patients with a BMI equal to, or above 35, due to increase perioperative risks, increased technical procedural difficulties, and excessive radiation exposure to healthcare personnel.  ______________________________________________________________________________________________

## 2021-12-28 DIAGNOSIS — G8929 Other chronic pain: Secondary | ICD-10-CM | POA: Diagnosis not present

## 2021-12-28 DIAGNOSIS — M1712 Unilateral primary osteoarthritis, left knee: Secondary | ICD-10-CM | POA: Diagnosis not present

## 2021-12-28 DIAGNOSIS — M25462 Effusion, left knee: Secondary | ICD-10-CM | POA: Diagnosis not present

## 2022-02-06 DIAGNOSIS — I5032 Chronic diastolic (congestive) heart failure: Secondary | ICD-10-CM | POA: Diagnosis not present

## 2022-02-06 DIAGNOSIS — I6523 Occlusion and stenosis of bilateral carotid arteries: Secondary | ICD-10-CM | POA: Diagnosis not present

## 2022-02-06 DIAGNOSIS — Z955 Presence of coronary angioplasty implant and graft: Secondary | ICD-10-CM | POA: Diagnosis not present

## 2022-02-06 DIAGNOSIS — I4729 Other ventricular tachycardia: Secondary | ICD-10-CM | POA: Diagnosis not present

## 2022-02-06 DIAGNOSIS — I6340 Cerebral infarction due to embolism of unspecified cerebral artery: Secondary | ICD-10-CM | POA: Diagnosis not present

## 2022-02-06 DIAGNOSIS — E782 Mixed hyperlipidemia: Secondary | ICD-10-CM | POA: Diagnosis not present

## 2022-02-06 DIAGNOSIS — I10 Essential (primary) hypertension: Secondary | ICD-10-CM | POA: Diagnosis not present

## 2022-02-06 DIAGNOSIS — I2510 Atherosclerotic heart disease of native coronary artery without angina pectoris: Secondary | ICD-10-CM | POA: Diagnosis not present

## 2022-02-06 DIAGNOSIS — I700 Atherosclerosis of aorta: Secondary | ICD-10-CM | POA: Diagnosis not present

## 2022-03-20 DIAGNOSIS — I639 Cerebral infarction, unspecified: Secondary | ICD-10-CM | POA: Diagnosis not present

## 2022-03-29 DIAGNOSIS — G8929 Other chronic pain: Secondary | ICD-10-CM | POA: Diagnosis not present

## 2022-03-29 DIAGNOSIS — M25562 Pain in left knee: Secondary | ICD-10-CM | POA: Diagnosis not present

## 2022-03-29 DIAGNOSIS — F0280 Dementia in other diseases classified elsewhere without behavioral disturbance: Secondary | ICD-10-CM | POA: Diagnosis not present

## 2022-03-29 DIAGNOSIS — M1712 Unilateral primary osteoarthritis, left knee: Secondary | ICD-10-CM | POA: Diagnosis not present

## 2022-03-29 DIAGNOSIS — M25462 Effusion, left knee: Secondary | ICD-10-CM | POA: Diagnosis not present

## 2022-03-29 DIAGNOSIS — G309 Alzheimer's disease, unspecified: Secondary | ICD-10-CM | POA: Diagnosis not present

## 2022-04-04 DIAGNOSIS — E119 Type 2 diabetes mellitus without complications: Secondary | ICD-10-CM | POA: Diagnosis not present

## 2022-04-04 DIAGNOSIS — H43813 Vitreous degeneration, bilateral: Secondary | ICD-10-CM | POA: Diagnosis not present

## 2022-04-04 DIAGNOSIS — Z961 Presence of intraocular lens: Secondary | ICD-10-CM | POA: Diagnosis not present

## 2022-04-18 DIAGNOSIS — G8929 Other chronic pain: Secondary | ICD-10-CM | POA: Diagnosis not present

## 2022-04-18 DIAGNOSIS — M25462 Effusion, left knee: Secondary | ICD-10-CM | POA: Diagnosis not present

## 2022-04-18 DIAGNOSIS — M1712 Unilateral primary osteoarthritis, left knee: Secondary | ICD-10-CM | POA: Diagnosis not present

## 2022-04-23 DIAGNOSIS — E1159 Type 2 diabetes mellitus with other circulatory complications: Secondary | ICD-10-CM | POA: Diagnosis not present

## 2022-04-30 DIAGNOSIS — R001 Bradycardia, unspecified: Secondary | ICD-10-CM | POA: Diagnosis not present

## 2022-04-30 DIAGNOSIS — E785 Hyperlipidemia, unspecified: Secondary | ICD-10-CM | POA: Diagnosis not present

## 2022-04-30 DIAGNOSIS — E1159 Type 2 diabetes mellitus with other circulatory complications: Secondary | ICD-10-CM | POA: Diagnosis not present

## 2022-04-30 DIAGNOSIS — I5032 Chronic diastolic (congestive) heart failure: Secondary | ICD-10-CM | POA: Diagnosis not present

## 2022-04-30 DIAGNOSIS — I2510 Atherosclerotic heart disease of native coronary artery without angina pectoris: Secondary | ICD-10-CM | POA: Diagnosis not present

## 2022-04-30 DIAGNOSIS — I152 Hypertension secondary to endocrine disorders: Secondary | ICD-10-CM | POA: Diagnosis not present

## 2022-04-30 DIAGNOSIS — E1169 Type 2 diabetes mellitus with other specified complication: Secondary | ICD-10-CM | POA: Diagnosis not present

## 2022-04-30 DIAGNOSIS — E1140 Type 2 diabetes mellitus with diabetic neuropathy, unspecified: Secondary | ICD-10-CM | POA: Diagnosis not present

## 2022-05-05 ENCOUNTER — Other Ambulatory Visit: Payer: Self-pay | Admitting: Urology

## 2022-05-10 DIAGNOSIS — J449 Chronic obstructive pulmonary disease, unspecified: Secondary | ICD-10-CM | POA: Diagnosis not present

## 2022-05-10 DIAGNOSIS — G4733 Obstructive sleep apnea (adult) (pediatric): Secondary | ICD-10-CM | POA: Diagnosis not present

## 2022-05-10 DIAGNOSIS — R0602 Shortness of breath: Secondary | ICD-10-CM | POA: Diagnosis not present

## 2022-05-24 DIAGNOSIS — I4729 Other ventricular tachycardia: Secondary | ICD-10-CM | POA: Diagnosis not present

## 2022-05-24 DIAGNOSIS — I6523 Occlusion and stenosis of bilateral carotid arteries: Secondary | ICD-10-CM | POA: Diagnosis not present

## 2022-05-24 DIAGNOSIS — I2510 Atherosclerotic heart disease of native coronary artery without angina pectoris: Secondary | ICD-10-CM | POA: Diagnosis not present

## 2022-05-24 DIAGNOSIS — I5032 Chronic diastolic (congestive) heart failure: Secondary | ICD-10-CM | POA: Diagnosis not present

## 2022-05-24 DIAGNOSIS — Z955 Presence of coronary angioplasty implant and graft: Secondary | ICD-10-CM | POA: Diagnosis not present

## 2022-05-24 DIAGNOSIS — Z95818 Presence of other cardiac implants and grafts: Secondary | ICD-10-CM | POA: Diagnosis not present

## 2022-05-24 DIAGNOSIS — I639 Cerebral infarction, unspecified: Secondary | ICD-10-CM | POA: Diagnosis not present

## 2022-05-24 DIAGNOSIS — I700 Atherosclerosis of aorta: Secondary | ICD-10-CM | POA: Diagnosis not present

## 2022-05-24 DIAGNOSIS — I10 Essential (primary) hypertension: Secondary | ICD-10-CM | POA: Diagnosis not present

## 2022-05-25 NOTE — Telephone Encounter (Signed)
Close note

## 2022-07-18 DIAGNOSIS — G8929 Other chronic pain: Secondary | ICD-10-CM | POA: Diagnosis not present

## 2022-07-18 DIAGNOSIS — M25462 Effusion, left knee: Secondary | ICD-10-CM | POA: Diagnosis not present

## 2022-07-18 DIAGNOSIS — M1712 Unilateral primary osteoarthritis, left knee: Secondary | ICD-10-CM | POA: Diagnosis not present

## 2022-07-18 DIAGNOSIS — M25562 Pain in left knee: Secondary | ICD-10-CM | POA: Diagnosis not present

## 2022-07-30 DIAGNOSIS — M1712 Unilateral primary osteoarthritis, left knee: Secondary | ICD-10-CM | POA: Diagnosis not present

## 2022-08-30 ENCOUNTER — Other Ambulatory Visit: Payer: Medicare HMO

## 2022-08-30 DIAGNOSIS — C61 Malignant neoplasm of prostate: Secondary | ICD-10-CM | POA: Diagnosis not present

## 2022-08-31 LAB — PSA: Prostate Specific Ag, Serum: <0.1 ng/mL (ref 0.0–4.0)

## 2022-09-04 ENCOUNTER — Ambulatory Visit: Payer: Medicare HMO | Admitting: Urology

## 2022-09-04 VITALS — BP 144/78 | HR 101 | Ht 65.0 in | Wt 265.0 lb

## 2022-09-04 DIAGNOSIS — N3281 Overactive bladder: Secondary | ICD-10-CM

## 2022-09-04 DIAGNOSIS — C61 Malignant neoplasm of prostate: Secondary | ICD-10-CM | POA: Diagnosis not present

## 2022-09-04 NOTE — Progress Notes (Signed)
I,Amy L Pierron,acting as a scribe for Vanna Scotland, MD.,have documented all relevant documentation on the behalf of Vanna Scotland, MD,as directed by  Vanna Scotland, MD while in the presence of Vanna Scotland, MD.  09/04/2022 1:02 PM   Taylor Austin Jan 11, 1940 [id number]  Referring provider: Dorothey Baseman, MD [phone] [place],  [place] [address]  Chief Complaint  Patient presents with   Follow-up    HPI: 82 year-old male with a personal history of prostate cancer presents today for annual follow-up with PSA.  He was diagnosed with intermediate risk Gleason 4+3, status post IMRT in December 2019.   He also had urodynamics around the same time, indicating voiding dysfunction and OAB. He's tried multiple medications including anticholinergic, Finasteride, Myrbetriq, and PTNS without any improvement. In the past pelvic floor therapy was recommended, which he's never able to engage in. He's failed all other therapies. Also previously discussed Botox, which he did not elect to pursue.  His most recent PSA from 08/30/2022 remains undetectable.  He is doing well overall other than an occasional "diabetes episode". He has nocturia 2 or 3 times.  He has a weak stream.    PMH: Past Medical History:  Diagnosis Date   Allergy    Anemia    Arthritis    Back pain    Barrett esophagus    BPH (benign prostatic hyperplasia)    Chronic kidney disease    COPD (chronic obstructive pulmonary disease) (HCC)    Coronary artery disease    Depression    Diabetes mellitus without complication (HCC)    Diverticulitis    Dysphagia    Esophageal reflux    Esophageal reflux    GERD (gastroesophageal reflux disease)    Heart murmur    Hyperlipidemia    Hypersomnia    Hypertension    Kidney stones    Low testosterone    OAB (overactive bladder)    Obesity    Presence of dental bridge    2 - top   Rheumatic fever    Sleep apnea    BiPAP   Stroke (HCC) 01/2016   Vertigo     Vitamin B12 deficiency     Surgical History: Past Surgical History:  Procedure Laterality Date   BACK SURGERY     CARDIAC CATHETERIZATION  08/11/2014   Procedure: Left Heart Cath and Coronary Angiography;  Surgeon: Dalia Heading, MD;  Location: [facility];  Service: Cardiovascular;;   CARDIAC CATHETERIZATION N/A 11/17/2014   Procedure: Right Heart Cath;  Surgeon: Dalia Heading, MD;  Location: [facility];  Service: Cardiovascular;  Laterality: N/A;   CAROTID STENT     CATARACT EXTRACTION W/PHACO Left 04/03/2016   Procedure: CATARACT EXTRACTION PHACO AND INTRAOCULAR LENS PLACEMENT (IOC) left diabetic;  Surgeon: Nevada Crane, MD;  Location: [facility];  Service: Ophthalmology;  Laterality: Left;  diabetic - oral meds sleep apnea   CATARACT EXTRACTION W/PHACO Right 05/01/2016   Procedure: CATARACT EXTRACTION PHACO AND INTRAOCULAR LENS PLACEMENT (IOC) Right diabetic;  Surgeon: Nevada Crane, MD;  Location: [facility];  Service: Ophthalmology;  Laterality: Right;  Diabetic oral meds sleep apnea   CERVICAL SPINE SURGERY     COLONOSCOPY     COLONOSCOPY WITH PROPOFOL N/A 06/10/2015   Procedure: COLONOSCOPY WITH PROPOFOL;  Surgeon: Scot Jun, MD;  Location: [facility];  Service: Endoscopy;  Laterality: N/A;   CORONARY ANGIOPLASTY     ESOPHAGOGASTRODUODENOSCOPY (EGD) WITH PROPOFOL N/A  06/28/2017   Procedure: ESOPHAGOGASTRODUODENOSCOPY (EGD) WITH PROPOFOL;  Surgeon: Scot Jun, MD;  Location: [facility] END[facility];  Service: Endoscopy;  Laterality: N/A;   ESOPHAGOGASTRODUODENOSCOPY ENDOSCOPY     EYE SURGERY     LUMBAR SPINE SURGERY     THORACIC SPINE SURGERY     TONSILLECTOMY      Home Medications:  Allergies as of 09/04/2022       Reactions   Hydrocodone Shortness Of Breath   Oxycodone-acetaminophen Shortness Of Breath, Swelling   Confirmed with wife- tongue swelling and SOB after oxycodone        Medication List        Accurate  as of September 04, 2022  1:02 PM. If you have any questions, ask your nurse or doctor.          aspirin EC 81 MG tablet Take 81 mg by mouth every evening.   aspirin-acetaminophen-caffeine 250-250-65 MG tablet Commonly known as: EXCEDRIN MIGRAINE Take 1-2 tablets by mouth every 6 (six) hours as needed for headache.   atorvastatin 80 MG tablet Commonly known as: LIPITOR Take 0.5 tablets (40 mg total) by mouth every evening.   calcium carbonate 1500 (600 Ca) MG Tabs tablet Commonly known as: OSCAL Take 600 mg of elemental calcium by mouth daily with lunch.   clopidogrel 75 MG tablet Commonly known as: PLAVIX Take 1 tablet (75 mg total) by mouth daily.   cyanocobalamin 1000 MCG tablet Commonly known as: VITAMIN B12 Take 1,000 mcg by mouth daily with lunch.   donepezil 10 MG tablet Commonly known as: ARICEPT Take 10 mg by mouth every evening.   DULoxetine 60 MG capsule Commonly known as: CYMBALTA Take 60 mg by mouth daily with supper.   finasteride 5 MG tablet Commonly known as: PROSCAR TAKE 1 TABLET EVERY DAY   gabapentin 100 MG capsule Commonly known as: NEURONTIN Take 100 mg by mouth 5 (five) times daily.   glimepiride 4 MG tablet Commonly known as: AMARYL Take 4 mg by mouth in the morning.   losartan 100 MG tablet Commonly known as: COZAAR Take 50 mg by mouth daily.   magnesium oxide 400 MG tablet Commonly known as: MAG-OX Take 400 mg by mouth daily with lunch.   metFORMIN 500 MG 24 hr tablet Commonly known as: GLUCOPHAGE-XR Take 1,000 mg by mouth 2 (two) times daily with a meal.   Multi-Vitamins Tabs Take 1 tablet by mouth daily with lunch.   oxymetazoline 0.05 % nasal spray Commonly known as: AFRIN Place 1 spray into both nostrils 2 (two) times daily as needed for congestion.   pantoprazole 40 MG tablet Commonly known as: PROTONIX Take 40 mg by mouth daily before breakfast.   pioglitazone 15 MG tablet Commonly known as: ACTOS Take 45 mg by  mouth every evening.   sildenafil 20 MG tablet Commonly known as: REVATIO 1-5 tablets as needed one hour prior to intercourse   tamsulosin 0.4 MG Caps capsule Commonly known as: FLOMAX Take 1 capsule (0.4 mg total) by mouth daily after supper.   Tiotropium Bromide Monohydrate 2.5 MCG/ACT Aers Inhale 2 each into the lungs at bedtime.   Xultophy 100-3.6 UNIT-MG/ML Sopn Generic drug: Insulin Degludec-Liraglutide Inject 50 Units into the skin daily. (Only use if blood glucose is >70)        Allergies:  Allergies  Allergen Reactions   Hydrocodone Shortness Of Breath   Oxycodone-Acetaminophen Shortness Of Breath and Swelling    Confirmed with wife- tongue swelling and SOB after oxycodone  Family History: Family History  Problem Relation Age of Onset   Stroke Father    Anesthesia problems Father    Anesthesia problems Mother    Prostate cancer Brother    Kidney disease Neg Hx    Bladder Cancer Neg Hx     Social History:  reports that he quit smoking about 49 years ago. His smoking use included cigarettes. He has never used smokeless tobacco. He reports current alcohol use of about 1.0 standard drink of alcohol per week. He reports that he does not use drugs.   Physical Exam: BP (!) 144/78   Pulse (!) 101   Ht 5\' 5"  (1.651 m)   Wt 265 lb (120.2 kg)   BMI 44.10 kg/m   Constitutional:  Alert and oriented, No acute distress. HEENT: [place] AT, moist mucus membranes.  Trachea midline, no masses. Neurologic: Grossly intact, no focal deficits, moving all 4 extremities. Psychiatric: Normal mood and affect.   Assessment & Plan:    Prostate cancer  - PSA remains undetectable.   2. Severe OAB/ possible pelvic floor dysfunction   - Continue taking Flomax and Finasteride. Symptoms are stable, slightly bothersome, but not ready to proceed with physical therapy or Botox at this time. Will reach out if he changes his mind prior to next year's visit.  Return in about 1 year  (around 09/04/2023) for PSA.  I have reviewed the above documentation for accuracy and completeness, and I agree with the above.   Vanna Scotland, MD    [facility] [address] [place], [place] [address] [phone]

## 2022-10-25 DIAGNOSIS — E1140 Type 2 diabetes mellitus with diabetic neuropathy, unspecified: Secondary | ICD-10-CM | POA: Diagnosis not present

## 2022-10-30 DIAGNOSIS — M1712 Unilateral primary osteoarthritis, left knee: Secondary | ICD-10-CM | POA: Diagnosis not present

## 2022-10-30 DIAGNOSIS — M25462 Effusion, left knee: Secondary | ICD-10-CM | POA: Diagnosis not present

## 2022-10-30 DIAGNOSIS — G8929 Other chronic pain: Secondary | ICD-10-CM | POA: Diagnosis not present

## 2022-11-01 DIAGNOSIS — E1169 Type 2 diabetes mellitus with other specified complication: Secondary | ICD-10-CM | POA: Diagnosis not present

## 2022-11-01 DIAGNOSIS — E1140 Type 2 diabetes mellitus with diabetic neuropathy, unspecified: Secondary | ICD-10-CM | POA: Diagnosis not present

## 2022-11-01 DIAGNOSIS — E785 Hyperlipidemia, unspecified: Secondary | ICD-10-CM | POA: Diagnosis not present

## 2022-11-01 DIAGNOSIS — I152 Hypertension secondary to endocrine disorders: Secondary | ICD-10-CM | POA: Diagnosis not present

## 2022-11-01 DIAGNOSIS — E11649 Type 2 diabetes mellitus with hypoglycemia without coma: Secondary | ICD-10-CM | POA: Diagnosis not present

## 2022-11-01 DIAGNOSIS — E1159 Type 2 diabetes mellitus with other circulatory complications: Secondary | ICD-10-CM | POA: Diagnosis not present

## 2022-11-08 DIAGNOSIS — J439 Emphysema, unspecified: Secondary | ICD-10-CM | POA: Diagnosis not present

## 2022-11-08 DIAGNOSIS — G4733 Obstructive sleep apnea (adult) (pediatric): Secondary | ICD-10-CM | POA: Diagnosis not present

## 2022-11-08 DIAGNOSIS — R0609 Other forms of dyspnea: Secondary | ICD-10-CM | POA: Diagnosis not present

## 2022-11-14 DIAGNOSIS — E1142 Type 2 diabetes mellitus with diabetic polyneuropathy: Secondary | ICD-10-CM | POA: Diagnosis not present

## 2022-11-14 DIAGNOSIS — M79675 Pain in left toe(s): Secondary | ICD-10-CM | POA: Diagnosis not present

## 2022-11-14 DIAGNOSIS — B351 Tinea unguium: Secondary | ICD-10-CM | POA: Diagnosis not present

## 2022-11-14 DIAGNOSIS — M79674 Pain in right toe(s): Secondary | ICD-10-CM | POA: Diagnosis not present

## 2022-11-26 DIAGNOSIS — I700 Atherosclerosis of aorta: Secondary | ICD-10-CM | POA: Diagnosis not present

## 2022-11-26 DIAGNOSIS — Z95818 Presence of other cardiac implants and grafts: Secondary | ICD-10-CM | POA: Diagnosis not present

## 2022-11-26 DIAGNOSIS — I5032 Chronic diastolic (congestive) heart failure: Secondary | ICD-10-CM | POA: Diagnosis not present

## 2022-11-26 DIAGNOSIS — I639 Cerebral infarction, unspecified: Secondary | ICD-10-CM | POA: Diagnosis not present

## 2022-11-26 DIAGNOSIS — I6523 Occlusion and stenosis of bilateral carotid arteries: Secondary | ICD-10-CM | POA: Diagnosis not present

## 2022-11-26 DIAGNOSIS — I10 Essential (primary) hypertension: Secondary | ICD-10-CM | POA: Diagnosis not present

## 2022-11-26 DIAGNOSIS — E1140 Type 2 diabetes mellitus with diabetic neuropathy, unspecified: Secondary | ICD-10-CM | POA: Diagnosis not present

## 2022-11-26 DIAGNOSIS — I2510 Atherosclerotic heart disease of native coronary artery without angina pectoris: Secondary | ICD-10-CM | POA: Diagnosis not present

## 2022-11-26 DIAGNOSIS — Z955 Presence of coronary angioplasty implant and graft: Secondary | ICD-10-CM | POA: Diagnosis not present

## 2022-11-26 DIAGNOSIS — I4729 Other ventricular tachycardia: Secondary | ICD-10-CM | POA: Diagnosis not present

## 2022-12-26 DIAGNOSIS — I5032 Chronic diastolic (congestive) heart failure: Secondary | ICD-10-CM | POA: Diagnosis not present

## 2023-02-11 DIAGNOSIS — E119 Type 2 diabetes mellitus without complications: Secondary | ICD-10-CM | POA: Diagnosis not present

## 2023-02-11 DIAGNOSIS — I4891 Unspecified atrial fibrillation: Secondary | ICD-10-CM | POA: Diagnosis not present

## 2023-02-11 DIAGNOSIS — R0602 Shortness of breath: Secondary | ICD-10-CM | POA: Diagnosis not present

## 2023-02-11 DIAGNOSIS — G4733 Obstructive sleep apnea (adult) (pediatric): Secondary | ICD-10-CM | POA: Diagnosis not present

## 2023-02-11 DIAGNOSIS — I6340 Cerebral infarction due to embolism of unspecified cerebral artery: Secondary | ICD-10-CM | POA: Diagnosis not present

## 2023-02-11 DIAGNOSIS — E782 Mixed hyperlipidemia: Secondary | ICD-10-CM | POA: Diagnosis not present

## 2023-02-11 DIAGNOSIS — I5032 Chronic diastolic (congestive) heart failure: Secondary | ICD-10-CM | POA: Diagnosis not present

## 2023-02-11 DIAGNOSIS — R42 Dizziness and giddiness: Secondary | ICD-10-CM | POA: Diagnosis not present

## 2023-02-19 DIAGNOSIS — G8929 Other chronic pain: Secondary | ICD-10-CM | POA: Diagnosis not present

## 2023-02-19 DIAGNOSIS — M25461 Effusion, right knee: Secondary | ICD-10-CM | POA: Diagnosis not present

## 2023-02-19 DIAGNOSIS — M170 Bilateral primary osteoarthritis of knee: Secondary | ICD-10-CM | POA: Diagnosis not present

## 2023-02-19 DIAGNOSIS — M25462 Effusion, left knee: Secondary | ICD-10-CM | POA: Diagnosis not present

## 2023-02-19 DIAGNOSIS — M1711 Unilateral primary osteoarthritis, right knee: Secondary | ICD-10-CM | POA: Diagnosis not present

## 2023-02-26 ENCOUNTER — Encounter: Payer: Self-pay | Admitting: Radiology

## 2023-02-26 ENCOUNTER — Emergency Department: Payer: Medicare HMO

## 2023-02-26 ENCOUNTER — Inpatient Hospital Stay
Admission: EM | Admit: 2023-02-26 | Discharge: 2023-03-01 | DRG: 393 | Disposition: A | Discharge status: Home / Self Care | Payer: Medicare HMO | Source: Ambulatory Visit | Attending: Student | Admitting: Student

## 2023-02-26 ENCOUNTER — Other Ambulatory Visit: Payer: Self-pay

## 2023-02-26 DIAGNOSIS — Z955 Presence of coronary angioplasty implant and graft: Secondary | ICD-10-CM

## 2023-02-26 DIAGNOSIS — I639 Cerebral infarction, unspecified: Secondary | ICD-10-CM | POA: Diagnosis present

## 2023-02-26 DIAGNOSIS — K641 Second degree hemorrhoids: Secondary | ICD-10-CM | POA: Diagnosis not present

## 2023-02-26 DIAGNOSIS — Z87442 Personal history of urinary calculi: Secondary | ICD-10-CM

## 2023-02-26 DIAGNOSIS — K4020 Bilateral inguinal hernia, without obstruction or gangrene, not specified as recurrent: Secondary | ICD-10-CM | POA: Diagnosis not present

## 2023-02-26 DIAGNOSIS — Z886 Allergy status to analgesic agent status: Secondary | ICD-10-CM

## 2023-02-26 DIAGNOSIS — Z823 Family history of stroke: Secondary | ICD-10-CM

## 2023-02-26 DIAGNOSIS — K635 Polyp of colon: Principal | ICD-10-CM | POA: Diagnosis present

## 2023-02-26 DIAGNOSIS — K264 Chronic or unspecified duodenal ulcer with hemorrhage: Secondary | ICD-10-CM | POA: Diagnosis present

## 2023-02-26 DIAGNOSIS — F32A Depression, unspecified: Secondary | ICD-10-CM | POA: Diagnosis present

## 2023-02-26 DIAGNOSIS — M5459 Other low back pain: Secondary | ICD-10-CM | POA: Diagnosis present

## 2023-02-26 DIAGNOSIS — D649 Anemia, unspecified: Secondary | ICD-10-CM | POA: Diagnosis not present

## 2023-02-26 DIAGNOSIS — G894 Chronic pain syndrome: Secondary | ICD-10-CM | POA: Diagnosis present

## 2023-02-26 DIAGNOSIS — Z7901 Long term (current) use of anticoagulants: Secondary | ICD-10-CM | POA: Diagnosis not present

## 2023-02-26 DIAGNOSIS — Z8719 Personal history of other diseases of the digestive system: Secondary | ICD-10-CM

## 2023-02-26 DIAGNOSIS — E1122 Type 2 diabetes mellitus with diabetic chronic kidney disease: Secondary | ICD-10-CM | POA: Diagnosis present

## 2023-02-26 DIAGNOSIS — I2510 Atherosclerotic heart disease of native coronary artery without angina pectoris: Secondary | ICD-10-CM | POA: Diagnosis present

## 2023-02-26 DIAGNOSIS — Z794 Long term (current) use of insulin: Secondary | ICD-10-CM | POA: Diagnosis not present

## 2023-02-26 DIAGNOSIS — F0153 Vascular dementia, unspecified severity, with mood disturbance: Secondary | ICD-10-CM | POA: Diagnosis present

## 2023-02-26 DIAGNOSIS — I129 Hypertensive chronic kidney disease with stage 1 through stage 4 chronic kidney disease, or unspecified chronic kidney disease: Secondary | ICD-10-CM | POA: Diagnosis present

## 2023-02-26 DIAGNOSIS — K922 Gastrointestinal hemorrhage, unspecified: Secondary | ICD-10-CM | POA: Diagnosis not present

## 2023-02-26 DIAGNOSIS — Z8673 Personal history of transient ischemic attack (TIA), and cerebral infarction without residual deficits: Secondary | ICD-10-CM

## 2023-02-26 DIAGNOSIS — Z87891 Personal history of nicotine dependence: Secondary | ICD-10-CM

## 2023-02-26 DIAGNOSIS — M47816 Spondylosis without myelopathy or radiculopathy, lumbar region: Secondary | ICD-10-CM | POA: Diagnosis present

## 2023-02-26 DIAGNOSIS — I10 Essential (primary) hypertension: Secondary | ICD-10-CM | POA: Diagnosis not present

## 2023-02-26 DIAGNOSIS — E1140 Type 2 diabetes mellitus with diabetic neuropathy, unspecified: Secondary | ICD-10-CM | POA: Diagnosis present

## 2023-02-26 DIAGNOSIS — K5730 Diverticulosis of large intestine without perforation or abscess without bleeding: Secondary | ICD-10-CM | POA: Diagnosis not present

## 2023-02-26 DIAGNOSIS — K5790 Diverticulosis of intestine, part unspecified, without perforation or abscess without bleeding: Secondary | ICD-10-CM | POA: Diagnosis not present

## 2023-02-26 DIAGNOSIS — D62 Acute posthemorrhagic anemia: Secondary | ICD-10-CM | POA: Diagnosis not present

## 2023-02-26 DIAGNOSIS — F0283 Dementia in other diseases classified elsewhere, unspecified severity, with mood disturbance: Secondary | ICD-10-CM | POA: Diagnosis present

## 2023-02-26 DIAGNOSIS — F0150 Vascular dementia without behavioral disturbance: Secondary | ICD-10-CM | POA: Diagnosis present

## 2023-02-26 DIAGNOSIS — R1013 Epigastric pain: Secondary | ICD-10-CM | POA: Diagnosis not present

## 2023-02-26 DIAGNOSIS — I4891 Unspecified atrial fibrillation: Secondary | ICD-10-CM | POA: Diagnosis present

## 2023-02-26 DIAGNOSIS — Z7902 Long term (current) use of antithrombotics/antiplatelets: Secondary | ICD-10-CM

## 2023-02-26 DIAGNOSIS — C61 Malignant neoplasm of prostate: Secondary | ICD-10-CM | POA: Diagnosis not present

## 2023-02-26 DIAGNOSIS — Z7982 Long term (current) use of aspirin: Secondary | ICD-10-CM

## 2023-02-26 DIAGNOSIS — R413 Other amnesia: Secondary | ICD-10-CM | POA: Diagnosis present

## 2023-02-26 DIAGNOSIS — M51379 Other intervertebral disc degeneration, lumbosacral region without mention of lumbar back pain or lower extremity pain: Secondary | ICD-10-CM | POA: Diagnosis present

## 2023-02-26 DIAGNOSIS — K2270 Barrett's esophagus without dysplasia: Secondary | ICD-10-CM | POA: Diagnosis present

## 2023-02-26 DIAGNOSIS — K219 Gastro-esophageal reflux disease without esophagitis: Secondary | ICD-10-CM | POA: Diagnosis present

## 2023-02-26 DIAGNOSIS — I951 Orthostatic hypotension: Secondary | ICD-10-CM | POA: Diagnosis present

## 2023-02-26 DIAGNOSIS — Z6841 Body Mass Index (BMI) 40.0 and over, adult: Secondary | ICD-10-CM | POA: Diagnosis not present

## 2023-02-26 DIAGNOSIS — N3281 Overactive bladder: Secondary | ICD-10-CM | POA: Diagnosis present

## 2023-02-26 DIAGNOSIS — E785 Hyperlipidemia, unspecified: Secondary | ICD-10-CM | POA: Diagnosis present

## 2023-02-26 DIAGNOSIS — Z8546 Personal history of malignant neoplasm of prostate: Secondary | ICD-10-CM

## 2023-02-26 DIAGNOSIS — K269 Duodenal ulcer, unspecified as acute or chronic, without hemorrhage or perforation: Secondary | ICD-10-CM | POA: Diagnosis not present

## 2023-02-26 DIAGNOSIS — J449 Chronic obstructive pulmonary disease, unspecified: Secondary | ICD-10-CM | POA: Diagnosis not present

## 2023-02-26 DIAGNOSIS — R112 Nausea with vomiting, unspecified: Secondary | ICD-10-CM | POA: Diagnosis not present

## 2023-02-26 DIAGNOSIS — M47812 Spondylosis without myelopathy or radiculopathy, cervical region: Secondary | ICD-10-CM | POA: Diagnosis present

## 2023-02-26 DIAGNOSIS — N400 Enlarged prostate without lower urinary tract symptoms: Secondary | ICD-10-CM | POA: Diagnosis present

## 2023-02-26 DIAGNOSIS — Z885 Allergy status to narcotic agent status: Secondary | ICD-10-CM

## 2023-02-26 DIAGNOSIS — Z923 Personal history of irradiation: Secondary | ICD-10-CM

## 2023-02-26 DIAGNOSIS — Z79899 Other long term (current) drug therapy: Secondary | ICD-10-CM

## 2023-02-26 DIAGNOSIS — Z7984 Long term (current) use of oral hypoglycemic drugs: Secondary | ICD-10-CM

## 2023-02-26 DIAGNOSIS — G309 Alzheimer's disease, unspecified: Secondary | ICD-10-CM | POA: Diagnosis not present

## 2023-02-26 DIAGNOSIS — N281 Cyst of kidney, acquired: Secondary | ICD-10-CM | POA: Diagnosis not present

## 2023-02-26 DIAGNOSIS — N182 Chronic kidney disease, stage 2 (mild): Secondary | ICD-10-CM | POA: Diagnosis present

## 2023-02-26 LAB — PROTIME-INR
INR: 1.2 (ref 0.8–1.2)
Prothrombin Time: 15.8 s — ABNORMAL HIGH (ref 11.4–15.2)

## 2023-02-26 LAB — COMPREHENSIVE METABOLIC PANEL
ALT: 15 U/L (ref 0–44)
AST: 24 U/L (ref 15–41)
Albumin: 3.8 g/dL (ref 3.5–5.0)
Alkaline Phosphatase: 44 U/L (ref 38–126)
Anion gap: 13 (ref 5–15)
BUN: 16 mg/dL (ref 8–23)
CO2: 23 mmol/L (ref 22–32)
Calcium: 9.7 mg/dL (ref 8.9–10.3)
Chloride: 101 mmol/L (ref 98–111)
Creatinine, Ser: 0.87 mg/dL (ref 0.61–1.24)
GFR, Estimated: >60 mL/min (ref >60)
Glucose, Bld: 129 mg/dL — ABNORMAL HIGH (ref 70–99)
Potassium: 4.4 mmol/L (ref 3.5–5.1)
Sodium: 137 mmol/L (ref 135–145)
Total Bilirubin: 0.5 mg/dL (ref 0.0–1.2)
Total Protein: 6.6 g/dL (ref 6.5–8.1)

## 2023-02-26 LAB — CBC
HCT: 18.6 % — ABNORMAL LOW (ref 39.0–52.0)
Hemoglobin: 5.1 g/dL — ABNORMAL LOW (ref 13.0–17.0)
MCH: 23.2 pg — ABNORMAL LOW (ref 26.0–34.0)
MCHC: 27.4 g/dL — ABNORMAL LOW (ref 30.0–36.0)
MCV: 84.5 fL (ref 80.0–100.0)
Platelets: 265 10*3/uL (ref 150–400)
RBC: 2.2 MIL/uL — ABNORMAL LOW (ref 4.22–5.81)
RDW: 16.9 % — ABNORMAL HIGH (ref 11.5–15.5)
WBC: 5 10*3/uL (ref 4.0–10.5)
nRBC: 0 % (ref 0.0–0.2)

## 2023-02-26 LAB — ABO/RH: ABO/RH(D): O POS

## 2023-02-26 LAB — PREPARE RBC (CROSSMATCH)

## 2023-02-26 LAB — CBG MONITORING, ED: Glucose-Capillary: 89 mg/dL (ref 70–99)

## 2023-02-26 MED ORDER — TIOTROPIUM BROMIDE MONOHYDRATE 2.5 MCG/ACT IN AERS: 2.0000 | INHALATION_SPRAY | Freq: Every day | RESPIRATORY_TRACT | Status: DC

## 2023-02-26 MED ORDER — IOHEXOL 300 MG/ML  SOLN
100.0000 mL | Freq: Once | INTRAMUSCULAR | Status: AC | PRN
  Administered 2023-02-26: 100 mL via INTRAVENOUS

## 2023-02-26 MED ORDER — TAMSULOSIN HCL 0.4 MG PO CAPS
0.4000 mg | ORAL_CAPSULE | Freq: Every day | ORAL | Status: DC
  Administered 2023-02-27 – 2023-02-28 (×2): 0.4 mg via ORAL
  Filled 2023-02-26 (×2): qty 1

## 2023-02-26 MED ORDER — ATORVASTATIN CALCIUM 20 MG PO TABS: 40.0000 mg | ORAL_TABLET | Freq: Every evening | ORAL | Status: DC

## 2023-02-26 MED ORDER — INSULIN ASPART 100 UNIT/ML IJ SOLN
0.0000 [IU] | Freq: Three times a day (TID) | INTRAMUSCULAR | Status: DC
  Administered 2023-03-01 (×2): 2 [IU] via SUBCUTANEOUS
  Filled 2023-02-26 (×3): qty 1

## 2023-02-26 MED ORDER — ADULT MULTIVITAMIN W/MINERALS CH
1.0000 | ORAL_TABLET | Freq: Every day | ORAL | Status: DC
  Administered 2023-02-27 – 2023-03-01 (×2): 1 via ORAL
  Filled 2023-02-26 (×2): qty 1

## 2023-02-26 MED ORDER — ACETAMINOPHEN 650 MG RE SUPP: 650.0000 mg | Freq: Four times a day (QID) | RECTAL | Status: DC | PRN

## 2023-02-26 MED ORDER — SODIUM CHLORIDE 0.9 % IV SOLN: 10.0000 mL/h | Freq: Once | INTRAVENOUS | Status: DC

## 2023-02-26 MED ORDER — SENNOSIDES-DOCUSATE SODIUM 8.6-50 MG PO TABS: 1.0000 | ORAL_TABLET | Freq: Every evening | ORAL | Status: DC | PRN

## 2023-02-26 MED ORDER — PANTOPRAZOLE SODIUM 40 MG IV SOLR
40.0000 mg | Freq: Once | INTRAVENOUS | Status: AC
  Administered 2023-02-26: 40 mg via INTRAVENOUS
  Filled 2023-02-26: qty 10

## 2023-02-26 MED ORDER — CALCIUM CARBONATE 1250 (500 CA) MG PO TABS
500.0000 mg | ORAL_TABLET | Freq: Every day | ORAL | Status: DC
  Administered 2023-02-27 – 2023-03-01 (×2): 1250 mg via ORAL
  Filled 2023-02-26 (×2): qty 1

## 2023-02-26 MED ORDER — LOSARTAN POTASSIUM 50 MG PO TABS
50.0000 mg | ORAL_TABLET | Freq: Every day | ORAL | Status: DC
  Administered 2023-02-27 – 2023-03-01 (×2): 50 mg via ORAL
  Filled 2023-02-26 (×2): qty 1

## 2023-02-26 MED ORDER — HYDRALAZINE HCL 20 MG/ML IJ SOLN: 5.0000 mg | Freq: Four times a day (QID) | INTRAMUSCULAR | Status: DC | PRN

## 2023-02-26 MED ORDER — OXYMETAZOLINE HCL 0.05 % NA SOLN: 1.0000 | Freq: Two times a day (BID) | NASAL | Status: DC | PRN

## 2023-02-26 MED ORDER — INSULIN ASPART 100 UNIT/ML IJ SOLN
0.0000 [IU] | Freq: Every day | INTRAMUSCULAR | Status: DC
Start: 2023-02-26 — End: 2023-03-01

## 2023-02-26 MED ORDER — ATORVASTATIN CALCIUM 20 MG PO TABS
40.0000 mg | ORAL_TABLET | Freq: Every day | ORAL | Status: DC
  Administered 2023-02-27 – 2023-02-28 (×2): 40 mg via ORAL
  Filled 2023-02-26 (×2): qty 2

## 2023-02-26 MED ORDER — PANTOPRAZOLE SODIUM 40 MG IV SOLR
40.0000 mg | Freq: Two times a day (BID) | INTRAVENOUS | Status: DC
  Administered 2023-02-26 – 2023-03-01 (×5): 40 mg via INTRAVENOUS
  Filled 2023-02-26 (×5): qty 10

## 2023-02-26 MED ORDER — ACETAMINOPHEN 325 MG PO TABS
650.0000 mg | ORAL_TABLET | Freq: Four times a day (QID) | ORAL | Status: DC | PRN
  Administered 2023-03-01: 650 mg via ORAL
  Filled 2023-02-26: qty 2

## 2023-02-26 MED ORDER — ONDANSETRON HCL 4 MG PO TABS: 4.0000 mg | ORAL_TABLET | Freq: Four times a day (QID) | ORAL | Status: DC | PRN

## 2023-02-26 MED ORDER — FINASTERIDE 5 MG PO TABS
5.0000 mg | ORAL_TABLET | Freq: Every day | ORAL | Status: DC
  Administered 2023-02-27 – 2023-03-01 (×2): 5 mg via ORAL
  Filled 2023-02-26 (×3): qty 1

## 2023-02-26 MED ORDER — ONDANSETRON HCL 4 MG/2ML IJ SOLN: 4.0000 mg | Freq: Four times a day (QID) | INTRAMUSCULAR | Status: DC | PRN

## 2023-02-26 MED ORDER — VITAMIN B-12 1000 MCG PO TABS
1000.0000 ug | ORAL_TABLET | Freq: Every day | ORAL | Status: DC
Start: 2023-02-27 — End: 2023-03-01
  Administered 2023-02-27 – 2023-03-01 (×2): 1000 ug via ORAL
  Filled 2023-02-26: qty 2
  Filled 2023-02-26: qty 1

## 2023-02-26 MED ORDER — GABAPENTIN 100 MG PO CAPS
100.0000 mg | ORAL_CAPSULE | Freq: Every day | ORAL | Status: DC
  Administered 2023-02-27 – 2023-03-01 (×7): 100 mg via ORAL
  Filled 2023-02-26 (×7): qty 1

## 2023-02-26 MED ORDER — DONEPEZIL HCL 5 MG PO TABS
10.0000 mg | ORAL_TABLET | Freq: Every evening | ORAL | Status: DC
Start: 2023-02-26 — End: 2023-03-01
  Administered 2023-02-27 – 2023-02-28 (×2): 10 mg via ORAL
  Filled 2023-02-26 (×4): qty 2

## 2023-02-26 MED ORDER — DULOXETINE HCL 60 MG PO CPEP: 60.0000 mg | ORAL_CAPSULE | Freq: Every day | ORAL | Status: DC

## 2023-02-26 NOTE — ED Provider Notes (Signed)
[facility] Provider Note   Event Date/Time   First MD Initiated Contact with Patient 02/26/23 1540     (approximate) History  Anemia and Abdominal Pain  HPI Taylor Austin is a 83 y.o. male with a stated past medical history of hypertension and atrial fibrillation on Eliquis who presents after intermittent bright red blood per rectum over the last couple months resulting in worsening shortness of breath with dyspnea on exertion.  Patient states that he has noticed dark red blood as well as dark tarry stools intermittently over this time as well.  Of note, patient states that he has not had any bright red blood per rectum today however his shortness of breath is significantly increased.  Patient also endorses mild abdominal pain with nausea.  Patient states last dose of Eliquis was taken this morning. ROS: Patient currently denies any vision changes, tinnitus, difficulty speaking, facial droop, sore throat, chest pain, shortness of breath, vomiting/diarrhea, dysuria, or weakness/numbness/paresthesias in any extremity   Physical Exam  Triage Vital Signs: ED Triage Vitals  Encounter Vitals Group     BP 02/26/23 1406 133/87     Systolic BP Percentile --      Diastolic BP Percentile --      Pulse Rate 02/26/23 1406 100     Resp 02/26/23 1406 19     Temp 02/26/23 1409 98 F (36.7 C)     Temp Source 02/26/23 1406 Oral     SpO2 02/26/23 1406 95 %     Weight --      Height --      Head Circumference --      Peak Flow --      Pain Score 02/26/23 1408 4     Pain Loc --      Pain Education --      Exclude from Growth Chart --    Most recent vital signs: Vitals:   02/26/23 2232 02/26/23 2300  BP: (!) 127/52 (!) 129/39  Pulse: 86 87  Resp: 20 20  Temp: 98.2 F (36.8 C) 98.2 F (36.8 C)  SpO2: 100%    General: Awake, oriented x4. CV:  Good peripheral perfusion.  Resp:  Increased effort.  Tachypneic Abd:  No distention.  Other:  Elderly obese Caucasian  male resting comfortably in no mild respiratory distress ED Results / Procedures / Treatments  Labs (all labs ordered are listed, but only abnormal results are displayed) Labs Reviewed  COMPREHENSIVE METABOLIC PANEL - Abnormal; Notable for the following components:      Result Value   Glucose, Bld 129 (*)    All other components within normal limits  CBC - Abnormal; Notable for the following components:   RBC 2.20 (*)    Hemoglobin 5.1 (*)    HCT 18.6 (*)    MCH 23.2 (*)    MCHC 27.4 (*)    RDW 16.9 (*)    All other components within normal limits  PROTIME-INR - Abnormal; Notable for the following components:   Prothrombin Time 15.8 (*)    All other components within normal limits  URINALYSIS, W/ REFLEX TO CULTURE (INFECTION SUSPECTED)  BASIC METABOLIC PANEL  CBC  HEMOGLOBIN A1C  POC OCCULT BLOOD, ED  CBG MONITORING, ED  TYPE AND SCREEN  ABO/RH  PREPARE RBC (CROSSMATCH)   EKG ED ECG REPORT I, Merwyn Katos, the attending physician, personally viewed and interpreted this ECG. Date: 02/26/2023 EKG Time: 1603 Rate: 89 Rhythm: normal sinus rhythm QRS  Axis: normal Intervals: normal ST/T Wave abnormalities: normal Narrative Interpretation: no evidence of acute ischemia RADIOLOGY ED MD interpretation:  CT of the abdomen and pelvis independently interpreted and shows sigmoid diverticulosis without evidence of diverticulitis, no bowel obstruction, normal appendix, mildly thickened and trabeculated appearance of the bladder wall with perivesicular haziness possibly relating to a cystitis -Agree with radiology assessment Official radiology report(s): CT ABDOMEN PELVIS W CONTRAST Result Date: 02/26/2023 CLINICAL DATA:  Abdominal pain. EXAM: CT ABDOMEN AND PELVIS WITH CONTRAST TECHNIQUE: Multidetector CT imaging of the abdomen and pelvis was performed using the standard protocol following bolus administration of intravenous contrast. RADIATION DOSE REDUCTION: This exam was performed  according to the departmental dose-optimization program which includes automated exposure control, adjustment of the mA and/or kV according to patient size and/or use of iterative reconstruction technique. CONTRAST:  OMNIPAQUE IOHEXOL 300 MG/ML  SOLN COMPARISON:  CT dated 11/19/2020. FINDINGS: Lower chest: The visualized lung bases are clear. There is coronary vascular calcification. No intra-abdominal free air or free fluid. Hepatobiliary: The liver is unremarkable. No biliary dilatation. No calcified gallstone or pericholecystic fluid. Pancreas: Unremarkable. No pancreatic ductal dilatation or surrounding inflammatory changes. Spleen: Normal in size without focal abnormality. Adrenals/Urinary Tract: The adrenal glands unremarkable. Vascular calcifications versus punctate nonobstructing bilateral renal calculi. Small left renal upper pole cyst. There is mild bilateral renal parenchyma atrophy. There is no hydronephrosis on either side. There is symmetric enhancement and excretion of contrast by both kidneys. The visualized ureters appear unremarkable. The urinary bladder is minimally distended. There is mildly thickened and trabeculated appearance of the bladder wall with perivesical haziness. Correlation with urinalysis recommended to exclude cystitis. Stomach/Bowel: There is sigmoid diverticulosis and scattered colonic diverticula. Moderate stool throughout the colon. There is no bowel obstruction or active inflammation. The appendix is normal. Vascular/Lymphatic: Moderate aortoiliac atherosclerotic disease. The IVC is unremarkable. No portal venous gas. There is no adenopathy. Reproductive: The prostate and seminal vesicles are grossly unremarkable. Other: Small fat containing bilateral inguinal hernias. Musculoskeletal: Osteopenia with degenerative changes. No acute osseous pathology. IMPRESSION: 1. Sigmoid diverticulosis. No bowel obstruction. Normal appendix. 2. Mildly thickened and trabeculated  appearance of the bladder wall with perivesical haziness. Correlation with urinalysis recommended to exclude cystitis. 3.  Aortic Atherosclerosis (ICD10-I70.0). Electronically Signed   By: Elgie Collard M.D.   On: 02/26/2023 17:15   PROCEDURES: Critical Care performed: Yes, see critical care procedure note(s) .1-3 Lead EKG Interpretation  Performed by: Merwyn Katos, MD Authorized by: Merwyn Katos, MD     Interpretation: normal     ECG rate:  71   ECG rate assessment: normal     Rhythm: sinus rhythm     Ectopy: none     Conduction: normal   CRITICAL CARE Performed by: Merwyn Katos  Total critical care time: 33 minutes  Critical care time was exclusive of separately billable procedures and treating other patients.  Critical care was necessary to treat or prevent imminent or life-threatening deterioration.  Critical care was time spent personally by me on the following activities: development of treatment plan with patient and/or surrogate as well as nursing, discussions with consultants, evaluation of patient's response to treatment, examination of patient, obtaining history from patient or surrogate, ordering and performing treatments and interventions, ordering and review of laboratory studies, ordering and review of radiographic studies, pulse oximetry and re-evaluation of patient's condition.  MEDICATIONS ORDERED IN ED: Medications  0.9 %  sodium chloride infusion (10 mL/hr Intravenous Not Given 02/26/23 2116)  acetaminophen (TYLENOL) tablet 650 mg (has no administration in time range)    Or  acetaminophen (TYLENOL) suppository 650 mg (has no administration in time range)  ondansetron (ZOFRAN) tablet 4 mg (has no administration in time range)    Or  ondansetron (ZOFRAN) injection 4 mg (has no administration in time range)  senna-docusate (Senokot-S) tablet 1 tablet (has no administration in time range)  pantoprazole (PROTONIX) injection 40 mg (40 mg Intravenous Given  02/26/23 2306)  insulin aspart (novoLOG) injection 0-15 Units (has no administration in time range)  insulin aspart (novoLOG) injection 0-5 Units ( Subcutaneous Not Given 02/26/23 2305)  hydrALAZINE (APRESOLINE) injection 5 mg (has no administration in time range)  atorvastatin (LIPITOR) tablet 40 mg (has no administration in time range)  losartan (COZAAR) tablet 50 mg (has no administration in time range)  donepezil (ARICEPT) tablet 10 mg (has no administration in time range)  DULoxetine (CYMBALTA) DR capsule 60 mg (has no administration in time range)  finasteride (PROSCAR) tablet 5 mg (has no administration in time range)  tamsulosin (FLOMAX) capsule 0.4 mg (has no administration in time range)  cyanocobalamin (VITAMIN B12) tablet 1,000 mcg (has no administration in time range)  calcium carbonate (OSCAL) tablet 1,500 mg (has no administration in time range)  multivitamin with minerals tablet 1 tablet (has no administration in time range)  oxymetazoline (AFRIN) 0.05 % nasal spray 1 spray (has no administration in time range)  Tiotropium Bromide Monohydrate AERS 2 each (has no administration in time range)  gabapentin (NEURONTIN) capsule 100 mg (has no administration in time range)  iohexol (OMNIPAQUE) 300 MG/ML solution 100 mL (100 mLs Intravenous Contrast Given 02/26/23 1507)  pantoprazole (PROTONIX) injection 40 mg (40 mg Intravenous Given 02/26/23 1805)   IMPRESSION / MDM / ASSESSMENT AND PLAN / ED COURSE  I reviewed the triage vital signs and the nursing notes.                             The patient is on the cardiac monitor to evaluate for evidence of arrhythmia and/or significant heart rate changes. Patient's presentation is most consistent with acute presentation with potential threat to life or bodily function. + Bright red blood per rectum, + black stool per rectum Given history and exam patient's presentation most consistent with GI bleed possibly secondary to peptic ulcer disease,  hemorrhoids or diverticular bleeding.  Patient is also at risk due to chronic anticoagulation for A-fib I have low suspicion for aortoenteric fistula, ENT bleeding mimic, Boerhaave's, Pulmonary bleeding mimic.  Workup: CBC, BMP, LFTs, Lipase, PT/INR, Type and Screen  Interventions: Protonix 40mg  IVP PRBC transfusion  Findings: Hb: 5.1  Disposition: Admit for close monitoring.   FINAL CLINICAL IMPRESSION(S) / ED DIAGNOSES   Final diagnoses:  Gastrointestinal hemorrhage, unspecified gastrointestinal hemorrhage type  Symptomatic anemia   Rx / DC Orders   ED Discharge Orders     None      Note:  This document was prepared using Dragon voice recognition software and may include unintentional dictation errors.   Merwyn Katos, MD 02/26/23 [phone]

## 2023-02-26 NOTE — ED Triage Notes (Signed)
Pt here with low hgb and abd pain. Pt hgb 5. Pt endorses nausea and fatigue. Pt states he has been having bright red rectal bleeding as well.

## 2023-02-26 NOTE — Assessment & Plan Note (Signed)
Finasteride 5 mg daily, tamsulosin 0.4 mg daily with supper resumed on admission

## 2023-02-26 NOTE — Assessment & Plan Note (Signed)
Home donepezil 10 mg every evening resumed on admission

## 2023-02-26 NOTE — Assessment & Plan Note (Signed)
Home atorvastatin 40 mg every evening resumed

## 2023-02-26 NOTE — Assessment & Plan Note (Addendum)
Home long-acting insulin not ordered on admission as patient will be n.p.o. after midnight AM team to resume home long-acting insulin when the benefits outweigh the risk Insulin SSI with at bedtime coverage ordered Goal inpatient blood glucose level is 140-180

## 2023-02-26 NOTE — Assessment & Plan Note (Signed)
Suspect secondary to upper GI bleed in setting of Eliquis use for atrial fibrillation Will hold Eliquis on admission Status post Protonix 40 mg IV one-time dose per EDP I have ordered Protonix 40 mg IV twice daily on admission Patient is status post type and screen per EDP 2 units of blood transfusion has been ordered by EDP Nursing order for repeat hemoglobin/hematocrit after completion of blood transfusion place Peripheral IV: Nursing order to ensure and maintain 2 peripheral IV (prefer large-bore) Goal hemoglobin is greater than 8 Clear liquid diet; n.p.o. after midnight

## 2023-02-26 NOTE — Assessment & Plan Note (Signed)
Home duloxetine 60 mg daily with supper resumed on admission

## 2023-02-26 NOTE — Assessment & Plan Note (Signed)
Home clopidogrel not resumed on admission

## 2023-02-26 NOTE — Assessment & Plan Note (Signed)
Hold clopidogrel, Eliquis on admission AM team to resume independent with risk

## 2023-02-26 NOTE — H&P (Signed)
History and Physical   Taylor Austin:[id number] DOB: Nov 16, 1940 DOA: 02/26/2023  PCP: Dorothey Baseman, MD Outpatient Specialists: Dr. Vanna Scotland, urology Patient coming from: Home  I have personally briefly reviewed patient's old medical records in [facility] EMR.  Chief Concern: Bright red blood per rectum, dark tarry stool, shortness of breath  HPI: Taylor Austin is a 36 male with history of atrial fibrillation on Eliquis, htn, cad s/p stent placement in early 2000s, who presents for chief concern of BRBPR and shortness of breath with exertion.  Vitals in the ED showed temperature of 98, respiration of 19, heart rate 100, blood pressure 133/87, SpO2 of 95% on room air.  Serum sodium is 137, potassium 4.4, chloride 101, bicarb 23, BUN of 16, serum creatinine 0.87, EGFR greater than 60, nonfasting blood glucose 129, WBC 5.0, hemoglobin of 5.1, platelets of 265.  EDP consulted gastroenterology specialist, Dr. Mia Creek, who is aware.  Per EDP, Dr. Mia Creek states the patient will be seen.  ED treatment: Protonix 40 mg IV one-time dose. ---------------------------- At bedside, patient was able to tell me his name, age, current year, current location.  He took Eliquis and plavix this morning already.  He reports he has been having bright red blood per rectum and dark stool for several weeks however over the last couple of days he has had normal colored stool.  He came to the hospital because his primary care doctor told him he had a low blood count and needed blood transfusion.  He reports he has been having progressively worsening shortness of breath for two years, especially over the last couple of weeks.  Now, he is short of breath even at rest.  He denies fever, chills, chest pain, dysuria, hematuria, diarrhea.  Social history: he lives at home with his spouse. He is former tobacco user, quit 1975. He denies recreational drug use. He infrequently uses etoh. He is  retired and formerly was a Comptroller.   ROS: Constitutional: no weight change, no fever ENT/Mouth: no sore throat, no rhinorrhea Eyes: no eye pain, no vision changes Cardiovascular: no chest pain, + dyspnea,  no edema, no palpitations Respiratory: no cough, no sputum, no wheezing Gastrointestinal: no nausea, no vomiting, no diarrhea, no constipation Genitourinary: no urinary incontinence, no dysuria, no hematuria Musculoskeletal: no arthralgias, no myalgias Skin: no skin lesions, no pruritus, Neuro: + weakness, no loss of consciousness, no syncope Psych: no anxiety, no depression, no decrease appetite Heme/Lymph: no bruising, no bleeding  ED Course: Discussed with EDP, patient requiring hospitalization for chief concerns of symptomatic anemia.  Assessment/Plan  Principal Problem:   Symptomatic anemia Active Problems:   DDD (degenerative disc disease), lumbosacral   DJD (degenerative joint disease) of cervical spine   Lumbar facet joint pain (Bilateral)   Lumbar facet syndrome   Upper GI bleed   History of CVA (cerebrovascular accident)   Orthostatic hypotension   Type 2 diabetes mellitus with diabetic neuropathy, without long-term current use of insulin (HCC)   Mixed Alzheimer's and vascular dementia (HCC)   Clinical depression   Benign essential HTN   HLD (hyperlipidemia)   Gastroesophageal reflux disease without esophagitis   History of Barrett's esophagus   Memory loss, short term   Chronic pain syndrome   Ischemic stroke (HCC)   Prostate cancer (HCC)   Morbid obesity (HCC)   Assessment and Plan:  * Symptomatic anemia Suspect secondary to upper GI bleed in setting of Eliquis use for atrial fibrillation Will hold Eliquis on  admission Status post Protonix 40 mg IV one-time dose per EDP I have ordered Protonix 40 mg IV twice daily on admission Patient is status post type and screen per EDP 2 units of blood transfusion has been ordered by EDP Nursing order  for repeat hemoglobin/hematocrit after completion of blood transfusion place Peripheral IV: Nursing order to ensure and maintain 2 peripheral IV (prefer large-bore) Goal hemoglobin is greater than 8 Clear liquid diet; n.p.o. after midnight  Upper GI bleed Hold clopidogrel, Eliquis on admission AM team to resume independent with risk  History of CVA (cerebrovascular accident) Home clopidogrel not resumed on admission  Mixed Alzheimer's and vascular dementia (HCC) Home donepezil 10 mg every evening resumed on admission  Type 2 diabetes mellitus with diabetic neuropathy, without long-term current use of insulin (HCC) Home long-acting insulin not ordered on admission as patient will be n.p.o. after midnight AM team to resume home long-acting insulin when the benefits outweigh the risk Insulin SSI with at bedtime coverage ordered Goal inpatient blood glucose level is 140-180  Morbid obesity (HCC) This complicates overall care and prognosis.   Prostate cancer (HCC) Finasteride 5 mg daily, tamsulosin 0.4 mg daily with supper resumed on admission  Gastroesophageal reflux disease without esophagitis Protonix 40 mg IV twice daily  HLD (hyperlipidemia) Home atorvastatin 40 mg every evening resumed  Benign essential HTN Hydralazine 5 mg IV every 6 hours as needed for SBP greater 165, 5 days ordered  Clinical depression Home duloxetine 60 mg daily with supper resumed on admission  Chart reviewed.   DVT prophylaxis: TED hose; AM team to resume home Eliquis when the benefits outweigh the risk Code Status: full code Diet: Clear liquid diet; n.p.o. after midnight Family Communication: updated spouse at bedside with patient's permission.  Disposition Plan: Pending clinical course Consults called: Gastroenterology Admission status: PCU, inpatient  Past Medical History:  Diagnosis Date   Allergy    Anemia    Arthritis    Back pain    Barrett esophagus    BPH (benign prostatic  hyperplasia)    Chronic kidney disease    COPD (chronic obstructive pulmonary disease) (HCC)    Coronary artery disease    Depression    Diabetes mellitus without complication (HCC)    Diverticulitis    Dysphagia    Esophageal reflux    Esophageal reflux    GERD (gastroesophageal reflux disease)    Heart murmur    Hyperlipidemia    Hypersomnia    Hypertension    Kidney stones    Low testosterone    OAB (overactive bladder)    Obesity    Presence of dental bridge    2 - top   Rheumatic fever    Sleep apnea    BiPAP   Stroke (HCC) 01/2016   Vertigo    Vitamin B12 deficiency    Past Surgical History:  Procedure Laterality Date   BACK SURGERY     CARDIAC CATHETERIZATION  08/11/2014   Procedure: Left Heart Cath and Coronary Angiography;  Surgeon: Dalia Heading, MD;  Location: [facility] INVASIVE CV LAB;  Service: Cardiovascular;;   CARDIAC CATHETERIZATION N/A 11/17/2014   Procedure: Right Heart Cath;  Surgeon: Dalia Heading, MD;  Location: [facility] INVASIVE CV LAB;  Service: Cardiovascular;  Laterality: N/A;   CAROTID STENT     CATARACT EXTRACTION W/PHACO Left 04/03/2016   Procedure: CATARACT EXTRACTION PHACO AND INTRAOCULAR LENS PLACEMENT (IOC) left diabetic;  Surgeon: Nevada Crane, MD;  Location: [facility];  Service: Ophthalmology;  Laterality: Left;  diabetic - oral meds sleep apnea   CATARACT EXTRACTION W/PHACO Right 05/01/2016   Procedure: CATARACT EXTRACTION PHACO AND INTRAOCULAR LENS PLACEMENT (IOC) Right diabetic;  Surgeon: Nevada Crane, MD;  Location: [facility];  Service: Ophthalmology;  Laterality: Right;  Diabetic oral meds sleep apnea   CERVICAL SPINE SURGERY     COLONOSCOPY     COLONOSCOPY WITH PROPOFOL N/A 06/10/2015   Procedure: COLONOSCOPY WITH PROPOFOL;  Surgeon: Scot Jun, MD;  Location[facility] END[facility];  Service: Endoscopy;  Laterality: N/A;   CORONARY ANGIOPLASTY     ESOPHAGOGASTRODUODENOSCOPY (EGD) WITH PROPOFOL N/A 06/28/2017    Procedure: ESOPHAGOGASTRODUODENOSCOPY (EGD) WITH PROPOFOL;  Surgeon: Scot Jun, MD;  Location: [facility] ENDOSCOPY;  Service: Endoscopy;  Laterality: N/A;   ESOPHAGOGASTRODUODENOSCOPY ENDOSCOPY     EYE SURGERY     LUMBAR SPINE SURGERY     THORACIC SPINE SURGERY     TONSILLECTOMY     Social History:  reports that he quit smoking about 49 years ago. His smoking use included cigarettes. He has never used smokeless tobacco. He reports current alcohol use of about 1.0 standard drink of alcohol per week. He reports that he does not use drugs.  Allergies  Allergen Reactions   Hydrocodone Shortness Of Breath   Oxycodone-Acetaminophen Shortness Of Breath and Swelling    Confirmed with wife- tongue swelling and SOB after oxycodone   Family History  Problem Relation Age of Onset   Stroke Father    Anesthesia problems Father    Anesthesia problems Mother    Prostate cancer Brother    Kidney disease Neg Hx    Bladder Cancer Neg Hx    Family history: Family history reviewed and not pertinent.  Prior to Admission medications   Medication Sig Start Date End Date Taking? Authorizing Provider  aspirin EC 81 MG tablet Take 81 mg by mouth every evening.    [provider]  aspirin-acetaminophen-caffeine (EXCEDRIN MIGRAINE) [phone] MG tablet Take 1-2 tablets by mouth every 6 (six) hours as needed for headache.    [provider]  atorvastatin (LIPITOR) 80 MG tablet Take 0.5 tablets (40 mg total) by mouth every evening. 07/26/21   Hollice Espy, MD  calcium carbonate (OSCAL) 1500 (600 Ca) MG TABS tablet Take 600 mg of elemental calcium by mouth daily with lunch.    [provider]  clopidogrel (PLAVIX) 75 MG tablet Take 1 tablet (75 mg total) by mouth daily. 02/01/16   Milagros Loll, MD  donepezil (ARICEPT) 10 MG tablet Take 10 mg by mouth every evening.    [provider]  DULoxetine (CYMBALTA) 60 MG capsule Take 60 mg by mouth daily with supper.     [provider]  finasteride (PROSCAR) 5 MG tablet TAKE 1 TABLET EVERY DAY 05/07/22   Vanna Scotland, MD  gabapentin (NEURONTIN) 100 MG capsule Take 100 mg by mouth 5 (five) times daily.    [provider]  glimepiride (AMARYL) 4 MG tablet Take 4 mg by mouth in the morning.    [provider]  Insulin Degludec-Liraglutide (XULTOPHY) 100-3.6 UNIT-MG/ML SOPN Inject 50 Units into the skin daily. (Only use if blood glucose is >70)    [provider]  losartan (COZAAR) 100 MG tablet Take 50 mg by mouth daily.    [provider]  magnesium oxide (MAG-OX) 400 MG tablet Take 400 mg by mouth daily with lunch.    [provider]  metFORMIN (GLUCOPHAGE-XR) 500  MG 24 hr tablet Take 1,000 mg by mouth 2 (two) times daily with a meal.    [provider]  Multiple Vitamin (MULTI-VITAMINS) TABS Take 1 tablet by mouth daily with lunch.    [provider]  oxymetazoline (AFRIN) 0.05 % nasal spray Place 1 spray into both nostrils 2 (two) times daily as needed for congestion.    [provider]  pantoprazole (PROTONIX) 40 MG tablet Take 40 mg by mouth daily before breakfast.    [provider]  pioglitazone (ACTOS) 15 MG tablet Take 45 mg by mouth every evening.    [provider]  sildenafil (REVATIO) 20 MG tablet 1-5 tablets as needed one hour prior to intercourse 04/26/20   Vanna Scotland, MD  tamsulosin (FLOMAX) 0.4 MG CAPS capsule Take 1 capsule (0.4 mg total) by mouth daily after supper. 01/30/18   Carmina Miller, MD  Tiotropium Bromide Monohydrate 2.5 MCG/ACT AERS Inhale 2 each into the lungs at bedtime.    [provider]  vitamin B-12 (CYANOCOBALAMIN) 1000 MCG tablet Take 1,000 mcg by mouth daily with lunch.    [provider]   Physical Exam: Vitals:   02/26/23 1815 02/26/23 1900 02/26/23 1915 02/26/23 1930  BP: (!) 146/65 (!) 150/69  (!) 150/96  Pulse: 87 96 (!) 102 (!) 114  Resp: 20 20  (!) 25 (!) 34  Temp: 98.7 F (37.1 C)     TempSrc: Oral     SpO2: 98% 100% 98% 99%   Constitutional: appears age-appropriate, frail, weak Eyes: PERRL, lids and conjunctivae normal ENMT: Mucous membranes are moist. Posterior pharynx clear of any exudate or lesions. Age-appropriate dentition. Hearing appropriate Neck: normal, supple, no masses, no thyromegaly Respiratory: clear to auscultation bilaterally, no wheezing, no crackles. No accessory muscle use.  Mild increased respiratory effort with prolonged H&P discussion Cardiovascular: Regular rate and rhythm, no murmurs / rubs / gallops. No extremity edema. 2+ pedal pulses. No carotid bruits.  Abdomen: Morbidly obese abdomen, no tenderness, no masses palpated, no hepatosplenomegaly. Bowel sounds positive.  Musculoskeletal: no clubbing / cyanosis. No joint deformity upper and lower extremities. Good ROM, no contractures, no atrophy. Normal muscle tone.  Skin: no rashes, lesions, ulcers. No induration Neurologic: Sensation intact. Strength 5/5 in all 4.  Psychiatric: Normal judgment and insight. Alert and oriented x 3. Normal mood.  Flat affect  EKG: independently reviewed, showing sinus rhythm with rate of 89, QTc 421  Chest x-ray on Admission: I personally reviewed and I agree with radiologist reading as below.  CT ABDOMEN PELVIS W CONTRAST Result Date: 02/26/2023 CLINICAL DATA:  Abdominal pain. EXAM: CT ABDOMEN AND PELVIS WITH CONTRAST TECHNIQUE: Multidetector CT imaging of the abdomen and pelvis was performed using the standard protocol following bolus administration of intravenous contrast. RADIATION DOSE REDUCTION: This exam was performed according to the departmental dose-optimization program which includes automated exposure control, adjustment of the mA and/or kV according to patient size and/or use of iterative reconstruction technique. CONTRAST:  OMNIPAQUE IOHEXOL 300 MG/ML  SOLN COMPARISON:  CT dated 11/19/2020. FINDINGS: Lower  chest: The visualized lung bases are clear. There is coronary vascular calcification. No intra-abdominal free air or free fluid. Hepatobiliary: The liver is unremarkable. No biliary dilatation. No calcified gallstone or pericholecystic fluid. Pancreas: Unremarkable. No pancreatic ductal dilatation or surrounding inflammatory changes. Spleen: Normal in size without focal abnormality. Adrenals/Urinary Tract: The adrenal glands unremarkable. Vascular calcifications versus punctate nonobstructing bilateral renal calculi. Small left renal upper pole cyst. There is mild bilateral  renal parenchyma atrophy. There is no hydronephrosis on either side. There is symmetric enhancement and excretion of contrast by both kidneys. The visualized ureters appear unremarkable. The urinary bladder is minimally distended. There is mildly thickened and trabeculated appearance of the bladder wall with perivesical haziness. Correlation with urinalysis recommended to exclude cystitis. Stomach/Bowel: There is sigmoid diverticulosis and scattered colonic diverticula. Moderate stool throughout the colon. There is no bowel obstruction or active inflammation. The appendix is normal. Vascular/Lymphatic: Moderate aortoiliac atherosclerotic disease. The IVC is unremarkable. No portal venous gas. There is no adenopathy. Reproductive: The prostate and seminal vesicles are grossly unremarkable. Other: Small fat containing bilateral inguinal hernias. Musculoskeletal: Osteopenia with degenerative changes. No acute osseous pathology. IMPRESSION: 1. Sigmoid diverticulosis. No bowel obstruction. Normal appendix. 2. Mildly thickened and trabeculated appearance of the bladder wall with perivesical haziness. Correlation with urinalysis recommended to exclude cystitis. 3.  Aortic Atherosclerosis (ICD10-I70.0). Electronically Signed   By: Elgie Collard M.D.   On: 02/26/2023 17:15   Labs on Admission: I have personally reviewed following labs  CBC: Recent  Labs  Lab 02/26/23 1409  WBC 5.0  HGB 5.1*  HCT 18.6*  MCV 84.5  PLT 265   Basic Metabolic Panel: Recent Labs  Lab 02/26/23 1409  NA 137  K 4.4  CL 101  CO2 23  GLUCOSE 129*  BUN 16  CREATININE 0.87  CALCIUM 9.7   GFR: CrCl cannot be calculated (Unknown ideal weight.).  Liver Function Tests: Recent Labs  Lab 02/26/23 1409  AST 24  ALT 15  ALKPHOS 44  BILITOT 0.5  PROT 6.6  ALBUMIN 3.8   Coagulation Profile: Recent Labs  Lab 02/26/23 1558  INR 1.2   Urine analysis:    Component Value Date/Time   COLORURINE Yellow 01/04/2014 1645   APPEARANCEUR Clear 11/22/2020 1438   LABSPEC 1.054 01/04/2014 1645   PHURINE 6.0 01/04/2014 1645   GLUCOSEU 3+ (A) 11/22/2020 1438   GLUCOSEU Negative 01/04/2014 1645   HGBUR Negative 01/04/2014 1645   BILIRUBINUR Negative 11/22/2020 1438   BILIRUBINUR Negative 01/04/2014 1645   KETONESUR Negative 01/04/2014 1645   PROTEINUR Negative 11/22/2020 1438   PROTEINUR Negative 01/04/2014 1645   NITRITE Negative 11/22/2020 1438   NITRITE Negative 01/04/2014 1645   LEUKOCYTESUR Negative 11/22/2020 1438   LEUKOCYTESUR Negative 01/04/2014 1645   CRITICAL CARE Performed by: Dr. Sedalia Muta  Total critical care time: 32 minutes  Critical care time was exclusive of separately billable procedures and treating other patients.  Critical care was necessary to treat or prevent imminent or life-threatening deterioration.  Critical care was time spent personally by me on the following activities: development of treatment plan with patient and/or surrogate as well as nursing, discussions with consultants, evaluation of patient's response to treatment, examination of patient, obtaining history from patient or surrogate, ordering and performing treatments and interventions, ordering and review of laboratory studies, ordering and review of radiographic studies, pulse oximetry and re-evaluation of patient's condition.  This document was prepared using  Dragon Voice Recognition software and may include unintentional dictation errors.  Dr. Sedalia Muta [facility]  If 7PM-7AM, please contact overnight-coverage provider If 7AM-7PM, please contact day attending provider [email or web address]  02/26/2023, 7:58 PM

## 2023-02-26 NOTE — Assessment & Plan Note (Signed)
-   Protonix 40 mg IV twice daily

## 2023-02-26 NOTE — Assessment & Plan Note (Signed)
 -  This complicates overall care and prognosis.

## 2023-02-26 NOTE — ED Provider Triage Note (Signed)
Emergency Medicine Provider Triage Evaluation Note  Taylor Austin , a 83 y.o. male  was evaluated in triage.  Pt complains of hgb of 5 sent by doctor, unsure of why hgb is low, states having lower abd pain.  Review of Systems  Positive:  Negative:   Physical Exam  BP 133/87 (BP Location: Left Arm)   Pulse 100   Resp 19   SpO2 95%  Gen:   Awake, no distress   Resp:  Normal effort  MSK:   Moves extremities without difficulty  Other:    Medical Decision Making  Medically screening exam initiated at 2:09 PM.  Appropriate orders placed.  Taylor Austin was informed that the remainder of the evaluation will be completed by another provider, this initial triage assessment does not replace that evaluation, and the importance of remaining in the ED until their evaluation is complete.     Faythe Ghee, PA-C 02/26/23 1409

## 2023-02-26 NOTE — Hospital Course (Addendum)
Taylor Austin is a 47 male with history of atrial fibrillation on Eliquis, htn, cad s/p stent placement in early 2000s, who presents for chief concern of BRBPR and shortness of breath with exertion.  Vitals in the ED showed temperature of 98, respiration of 19, heart rate 100, blood pressure 133/87, SpO2 of 95% on room air.  Serum sodium is 137, potassium 4.4, chloride 101, bicarb 23, BUN of 16, serum creatinine 0.87, EGFR greater than 60, nonfasting blood glucose 129, WBC 5.0, hemoglobin of 5.1, platelets of 265.  EDP consulted gastroenterology specialist, Dr. Mia Creek, who is aware.  Per EDP, Dr. Mia Creek states the patient will be seen.  ED treatment: Protonix 40 mg IV one-time dose.

## 2023-02-26 NOTE — Assessment & Plan Note (Signed)
 Hydralazine 5 mg IV every 6 hours as needed for SBP greater 165, 5 days ordered

## 2023-02-27 DIAGNOSIS — D649 Anemia, unspecified: Secondary | ICD-10-CM | POA: Diagnosis not present

## 2023-02-27 LAB — BASIC METABOLIC PANEL
Anion gap: 8 (ref 5–15)
BUN: 13 mg/dL (ref 8–23)
CO2: 24 mmol/L (ref 22–32)
Calcium: 9 mg/dL (ref 8.9–10.3)
Chloride: 104 mmol/L (ref 98–111)
Creatinine, Ser: 0.84 mg/dL (ref 0.61–1.24)
GFR, Estimated: >60 mL/min (ref >60)
Glucose, Bld: 61 mg/dL — ABNORMAL LOW (ref 70–99)
Potassium: 4.4 mmol/L (ref 3.5–5.1)
Sodium: 136 mmol/L (ref 135–145)

## 2023-02-27 LAB — HEMOGLOBIN AND HEMATOCRIT, BLOOD
HCT: 23.8 % — ABNORMAL LOW (ref 39.0–52.0)
HCT: 22.3 % — ABNORMAL LOW (ref 39.0–52.0)
Hemoglobin: 7.5 g/dL — ABNORMAL LOW (ref 13.0–17.0)
Hemoglobin: 7.2 g/dL — ABNORMAL LOW (ref 13.0–17.0)

## 2023-02-27 LAB — FOLATE: Folate: 23 ng/mL (ref >5.9)

## 2023-02-27 LAB — CBG MONITORING, ED
Glucose-Capillary: 60 mg/dL — ABNORMAL LOW (ref 70–99)
Glucose-Capillary: 72 mg/dL (ref 70–99)
Glucose-Capillary: 132 mg/dL — ABNORMAL HIGH (ref 70–99)
Glucose-Capillary: 75 mg/dL (ref 70–99)
Glucose-Capillary: 83 mg/dL (ref 70–99)

## 2023-02-27 LAB — URINALYSIS, W/ REFLEX TO CULTURE (INFECTION SUSPECTED)
Bacteria, UA: NONE SEEN
Bilirubin Urine: NEGATIVE
Glucose, UA: NEGATIVE mg/dL
Hgb urine dipstick: NEGATIVE
Ketones, ur: NEGATIVE mg/dL
Leukocytes,Ua: NEGATIVE
Nitrite: NEGATIVE
Protein, ur: NEGATIVE mg/dL
RBC / HPF: 0 RBC/hpf (ref 0–5)
Specific Gravity, Urine: 1.004 — ABNORMAL LOW (ref 1.005–1.030)
Squamous Epithelial / HPF: 0 /[HPF] (ref 0–5)
pH: 7 (ref 5.0–8.0)

## 2023-02-27 LAB — CBC
HCT: 19.8 % — ABNORMAL LOW (ref 39.0–52.0)
Hemoglobin: 6.2 g/dL — ABNORMAL LOW (ref 13.0–17.0)
MCH: 24.9 pg — ABNORMAL LOW (ref 26.0–34.0)
MCHC: 31.3 g/dL (ref 30.0–36.0)
MCV: 79.5 fL — ABNORMAL LOW (ref 80.0–100.0)
Platelets: 218 10*3/uL (ref 150–400)
RBC: 2.49 MIL/uL — ABNORMAL LOW (ref 4.22–5.81)
RDW: 15.9 % — ABNORMAL HIGH (ref 11.5–15.5)
WBC: 5.2 10*3/uL (ref 4.0–10.5)
nRBC: 0 % (ref 0.0–0.2)

## 2023-02-27 LAB — IRON AND TIBC
Iron: 209 ug/dL — ABNORMAL HIGH (ref 45–182)
Saturation Ratios: 45 % — ABNORMAL HIGH (ref 17.9–39.5)
TIBC: 462 ug/dL — ABNORMAL HIGH (ref 250–450)
UIBC: 253 ug/dL

## 2023-02-27 LAB — PREPARE RBC (CROSSMATCH)

## 2023-02-27 LAB — MAGNESIUM: Magnesium: 1.7 mg/dL (ref 1.7–2.4)

## 2023-02-27 LAB — PHOSPHORUS: Phosphorus: 4.6 mg/dL (ref 2.5–4.6)

## 2023-02-27 LAB — HEMOGLOBIN A1C
Hgb A1c MFr Bld: 5.8 % — ABNORMAL HIGH (ref 4.8–5.6)
Mean Plasma Glucose: 119.76 mg/dL

## 2023-02-27 MED ORDER — SODIUM CHLORIDE 0.9 % IV SOLN: Freq: Once | INTRAVENOUS | Status: DC

## 2023-02-27 MED ORDER — DEXTROSE 50 % IV SOLN
12.5000 g | INTRAVENOUS | Status: AC
  Administered 2023-02-27: 12.5 g via INTRAVENOUS
  Filled 2023-02-27: qty 50

## 2023-02-27 MED ORDER — PEG 3350-KCL-NA BICARB-NACL 420 G PO SOLR
4000.0000 mL | Freq: Once | ORAL | Status: AC
  Administered 2023-02-27: 4000 mL via ORAL
  Filled 2023-02-27: qty 4000

## 2023-02-27 NOTE — Consult Note (Signed)
Midge Minium, MD [facility]  [address], [address] [place], [place] [address] Phone: [phone] Fax : [phone]  Consultation  Referring Provider:     Dr. Sedalia Muta Primary Care Physician:  Dorothey Baseman, MD Primary Gastroenterologist: [facility]         Reason for Consultation:     Rectal bleeding and melena  Date of Admission:  02/26/2023 Date of Consultation:  02/27/2023         HPI:   Taylor Austin is a 83 y.o. male who reports that he has been having bright red blood per rectum for a few weeks that stopped about 5 days ago.  The patient became progressively short of breath and came to the hospital.  The patient was reported to have black tarry stools but the patient denies this at the present time.  The patient's wife and the patient endorsed that he takes Excedrin for headaches.  He reports that he takes 3 a day she reports that he takes more like 6.  The patient has been on Eliquis and Plavix and had a on the day of admission.  He also has a history of prostate cancer and was treated with radiation to the prostate.  Besides the shortness of breath he reports that he has been having progressive weakness.  The worsening of his shortness of breath has been for the last 2 years but worsening over the last few weeks.  There is no report of any fevers chills nausea or vomiting.  The patient's last colonoscopy was in 2017 by Dr. Mechele Collin.  The patient had blood work that showed his hemoglobin on admission was 5.1 and the patient has received 2 units of blood since admission and his blood count trending has showed:  Component     Latest Ref Rng 09/10/2021 09/11/2021 02/26/2023 02/27/2023  Hemoglobin     13.0 - 17.0 g/dL 36.6 (L)  44.0 (L)  5.1 (L)  6.2 (L)   HCT     39.0 - 52.0 % 32.9 (L)  35.1 (L)  18.6 (L)  19.8 (L)     I am now being asked to see the patient for his symptomatic anemia.  Past Medical History:  Diagnosis Date   Allergy    Anemia    Arthritis    Back pain    Barrett  esophagus    BPH (benign prostatic hyperplasia)    Chronic kidney disease    COPD (chronic obstructive pulmonary disease) (HCC)    Coronary artery disease    Depression    Diabetes mellitus without complication (HCC)    Diverticulitis    Dysphagia    Esophageal reflux    Esophageal reflux    GERD (gastroesophageal reflux disease)    Heart murmur    Hyperlipidemia    Hypersomnia    Hypertension    Kidney stones    Low testosterone    OAB (overactive bladder)    Obesity    Presence of dental bridge    2 - top   Rheumatic fever    Sleep apnea    BiPAP   Stroke (HCC) 01/2016   Vertigo    Vitamin B12 deficiency     Past Surgical History:  Procedure Laterality Date   BACK SURGERY     CARDIAC CATHETERIZATION  08/11/2014   Procedure: Left Heart Cath and Coronary Angiography;  Surgeon: Dalia Heading, MD;  Location: [facility] INVASIVE CV [facility];  Service: Cardiovascular;;   CARDIAC CATHETERIZATION  N/A 11/17/2014   Procedure: Right Heart Cath;  Surgeon: Dalia Heading, MD;  Location: [facility] INVASIVE CV LAB;  Service: Cardiovascular;  Laterality: N/A;   CAROTID STENT     CATARACT EXTRACTION W/PHACO Left 04/03/2016   Procedure: CATARACT EXTRACTION PHACO AND INTRAOCULAR LENS PLACEMENT (IOC) left diabetic;  Surgeon: Nevada Crane, MD;  Location: [facility];  Service: Ophthalmology;  Laterality: Left;  diabetic - oral meds sleep apnea   CATARACT EXTRACTION W/PHACO Right 05/01/2016   Procedure: CATARACT EXTRACTION PHACO AND INTRAOCULAR LENS PLACEMENT (IOC) Right diabetic;  Surgeon: Nevada Crane, MD;  Location: [facility];  Service: Ophthalmology;  Laterality: Right;  Diabetic oral meds sleep apnea   CERVICAL SPINE SURGERY     COLONOSCOPY     COLONOSCOPY WITH PROPOFOL N/A 06/10/2015   Procedure: COLONOSCOPY WITH PROPOFOL;  Surgeon: Scot Jun, MD;  Location: [facility] ENDOSCOPY;  Service: Endoscopy;  Laterality: N/A;   CORONARY ANGIOPLASTY     ESOPHAGOGASTRODUODENOSCOPY  (EGD) WITH PROPOFOL N/A 06/28/2017   Procedure: ESOPHAGOGASTRODUODENOSCOPY (EGD) WITH PROPOFOL;  Surgeon: Scot Jun, MD;  Location: [facility] ENDOSCOPY;  Service: Endoscopy;  Laterality: N/A;   ESOPHAGOGASTRODUODENOSCOPY ENDOSCOPY     EYE SURGERY     LUMBAR SPINE SURGERY     THORACIC SPINE SURGERY     TONSILLECTOMY      Prior to Admission medications   Medication Sig Start Date End Date Taking? Authorizing Provider  atorvastatin (LIPITOR) 40 MG tablet Take 1 tablet by mouth daily. 11/14/22  Yes [provider]  finasteride (PROSCAR) 5 MG tablet TAKE 1 TABLET EVERY DAY 05/07/22  Yes Vanna Scotland, MD  gabapentin (NEURONTIN) 100 MG capsule Take 100 mg by mouth 5 (five) times daily.   Yes [provider]  glimepiride (AMARYL) 4 MG tablet Take 2 mg by mouth in the morning.   Yes [provider]  losartan (COZAAR) 50 MG tablet Take 25 mg by mouth daily. 10/12/22  Yes [provider]  metFORMIN (GLUCOPHAGE-XR) 500 MG 24 hr tablet Take 1,000 mg by mouth 2 (two) times daily with a meal.   Yes [provider]  pantoprazole (PROTONIX) 40 MG tablet Take 40 mg by mouth daily. 02/26/23 02/26/24 Yes [provider]  pioglitazone (ACTOS) 15 MG tablet Take 45 mg by mouth every evening.   Yes [provider]  tamsulosin (FLOMAX) 0.4 MG CAPS capsule Take 1 capsule (0.4 mg total) by mouth daily after supper. 01/30/18  Yes Chrystal, Sherrine Maples, MD  warfarin (COUMADIN) 3 MG tablet Take 3 mg by mouth daily. 02/11/23 02/11/24 Yes [provider]  aspirin EC 81 MG tablet Take 81 mg by mouth every evening.    [provider]  aspirin-acetaminophen-caffeine (EXCEDRIN MIGRAINE) [phone] MG tablet Take 1-2 tablets by mouth every 6 (six) hours as needed for headache.    [provider]  atorvastatin (LIPITOR) 80 MG tablet Take 0.5 tablets (40 mg total) by mouth every evening. Patient not taking: Reported on 02/26/2023 07/26/21   Hollice Espy, MD  calcium carbonate (OSCAL) 1500 (600 Ca) MG TABS tablet Take 600 mg of elemental calcium by mouth daily with lunch.    [provider]  clopidogrel (PLAVIX) 75 MG tablet Take 1 tablet (75 mg total) by mouth daily. Patient not taking: Reported on 02/27/2023 02/01/16   Milagros Loll, MD  donepezil (ARICEPT) 10 MG tablet Take 10 mg by mouth every evening.    [provider]  DULoxetine (CYMBALTA) 60 MG capsule  Take 60 mg by mouth daily with supper.    [provider]  ELIQUIS 5 MG TABS tablet Take 5 mg by mouth 2 (two) times daily. Patient not taking: Reported on 02/27/2023 01/31/23   [provider]  Insulin Degludec-Liraglutide (XULTOPHY) 100-3.6 UNIT-MG/ML SOPN Inject 50 Units into the skin daily. (Only use if blood glucose is >70)    [provider]  magnesium oxide (MAG-OX) 400 MG tablet Take 400 mg by mouth daily with lunch.    [provider]  Multiple Vitamin (MULTI-VITAMINS) TABS Take 1 tablet by mouth daily with lunch.    [provider]  oxymetazoline (AFRIN) 0.05 % nasal spray Place 1 spray into both nostrils 2 (two) times daily as needed for congestion.    [provider]  sildenafil (REVATIO) 20 MG tablet 1-5 tablets as needed one hour prior to intercourse 04/26/20   Vanna Scotland, MD  sildenafil (REVATIO) 20 MG tablet Take 20 mg by mouth 3 (three) times daily.    [provider]  Tiotropium Bromide Monohydrate (SPIRIVA RESPIMAT) 2.5 MCG/ACT AERS Inhale into the lungs.    [provider]  Tiotropium Bromide Monohydrate 2.5 MCG/ACT AERS Inhale 2 each into the lungs at bedtime.    [provider]  vitamin B-12 (CYANOCOBALAMIN) 1000 MCG tablet Take 1,000 mcg by mouth daily with lunch.    [provider]    Family History  Problem Relation Age of Onset   Stroke Father    Anesthesia problems Father    Anesthesia problems Mother    Prostate cancer Brother    Kidney  disease Neg Hx    Bladder Cancer Neg Hx      Social History   Tobacco Use   Smoking status: Former    Current packs/day: 0.00    Types: Cigarettes    Quit date: 07/13/1973    Years since quitting: 49.6   Smokeless tobacco: Never   Tobacco comments:    reports he smoked 4 packs a day for 10 years and quit in 1975  Vaping Use   Vaping status: Never Used  Substance Use Topics   Alcohol use: Yes    Alcohol/week: 1.0 standard drink of alcohol    Types: 1 Standard drinks or equivalent per week    Comment: rarely drinks, for holidays   Drug use: No    Allergies as of 02/26/2023 - Review Complete 02/26/2023  Allergen Reaction Noted   Hydrocodone Shortness Of Breath 07/14/2014   Oxycodone-acetaminophen Shortness Of Breath and Swelling 07/14/2014    Review of Systems:    All systems reviewed and negative except where noted in HPI.   Physical Exam:  Vital signs in last 24 hours: Temp:  [97.6 F (36.4 C)-98.7 F (37.1 C)] 97.6 F (36.4 C) (02/19 0815) Pulse Rate:  [72-114] 89 (02/19 0815) Resp:  [0-34] 28 (02/19 0815) BP: (121-151)/(39-96) 138/63 (02/19 0815) SpO2:  [94 %-100 %] 99 % (02/19 0815) FiO2 (%):  [21 %] 21 % (02/18 2221)   General:   Pleasant, cooperative in NAD Head:  Normocephalic and atraumatic. Eyes:   No icterus.   Conjunctiva pink. PERRLA. Ears:  Normal auditory acuity. Neck:  Supple; no masses or thyroidomegaly Lungs: Respirations even and unlabored. Lungs clear to auscultation bilaterally.   No wheezes, crackles, or rhonchi.  Heart:  Regular rate and rhythm;  Without murmur, clicks, rubs or gallops Abdomen:  Soft, nondistended, nontender. Normal bowel sounds. No appreciable masses or hepatomegaly.  No rebound or guarding.  Rectal:  Not performed. Msk:  Symmetrical without gross deformities.    Extremities:  Without edema, cyanosis or clubbing. Neurologic:  Alert and oriented x3;  grossly normal neurologically. Skin:  Intact without significant lesions or  rashes. Cervical Nodes:  No significant cervical adenopathy. Psych:  Alert and cooperative. Normal affect.  LAB RESULTS: Recent Labs    02/26/23 1409 02/27/23 0316  WBC 5.0 5.2  HGB 5.1* 6.2*  HCT 18.6* 19.8*  PLT 265 218   BMET Recent Labs    02/26/23 1409 02/27/23 0316  NA 137 136  K 4.4 4.4  CL 101 104  CO2 23 24  GLUCOSE 129* 61*  BUN 16 13  CREATININE 0.87 0.84  CALCIUM 9.7 9.0   LFT Recent Labs    02/26/23 1409  PROT 6.6  ALBUMIN 3.8  AST 24  ALT 15  ALKPHOS 44  BILITOT 0.5   PT/INR Recent Labs    02/26/23 1558  LABPROT 15.8*  INR 1.2    STUDIES: CT ABDOMEN PELVIS W CONTRAST Result Date: 02/26/2023 CLINICAL DATA:  Abdominal pain. EXAM: CT ABDOMEN AND PELVIS WITH CONTRAST TECHNIQUE: Multidetector CT imaging of the abdomen and pelvis was performed using the standard protocol following bolus administration of intravenous contrast. RADIATION DOSE REDUCTION: This exam was performed according to the departmental dose-optimization program which includes automated exposure control, adjustment of the mA and/or kV according to patient size and/or use of iterative reconstruction technique. CONTRAST:  OMNIPAQUE IOHEXOL 300 MG/ML  SOLN COMPARISON:  CT dated 11/19/2020. FINDINGS: Lower chest: The visualized lung bases are clear. There is coronary vascular calcification. No intra-abdominal free air or free fluid. Hepatobiliary: The liver is unremarkable. No biliary dilatation. No calcified gallstone or pericholecystic fluid. Pancreas: Unremarkable. No pancreatic ductal dilatation or surrounding inflammatory changes. Spleen: Normal in size without focal abnormality. Adrenals/Urinary Tract: The adrenal glands unremarkable. Vascular calcifications versus punctate nonobstructing bilateral renal calculi. Small left renal upper pole cyst. There is mild bilateral renal parenchyma atrophy. There is no hydronephrosis on either side. There is symmetric enhancement and excretion of  contrast by both kidneys. The visualized ureters appear unremarkable. The urinary bladder is minimally distended. There is mildly thickened and trabeculated appearance of the bladder wall with perivesical haziness. Correlation with urinalysis recommended to exclude cystitis. Stomach/Bowel: There is sigmoid diverticulosis and scattered colonic diverticula. Moderate stool throughout the colon. There is no bowel obstruction or active inflammation. The appendix is normal. Vascular/Lymphatic: Moderate aortoiliac atherosclerotic disease. The IVC is unremarkable. No portal venous gas. There is no adenopathy. Reproductive: The prostate and seminal vesicles are grossly unremarkable. Other: Small fat containing bilateral inguinal hernias. Musculoskeletal: Osteopenia with degenerative changes. No acute osseous pathology. IMPRESSION: 1. Sigmoid diverticulosis. No bowel obstruction. Normal appendix. 2. Mildly thickened and trabeculated appearance of the bladder wall with perivesical haziness. Correlation with urinalysis recommended to exclude cystitis. 3.  Aortic Atherosclerosis (ICD10-I70.0). Electronically Signed   By: Elgie Collard M.D.   On: 02/26/2023 17:15      Impression / Plan:   Assessment: Principal Problem:   Symptomatic anemia Active Problems:   Clinical depression   Type 2 diabetes mellitus with diabetic neuropathy, without long-term current use of insulin (HCC)   Benign essential HTN   HLD (hyperlipidemia)   Gastroesophageal reflux disease without esophagitis   History of Barrett's esophagus   Memory loss, short term   Chronic pain syndrome   DDD (degenerative disc disease), lumbosacral   DJD (degenerative joint disease) of cervical spine   Lumbar facet  joint pain (Bilateral)   History of CVA (cerebrovascular accident)   Mixed Alzheimer's and vascular dementia (HCC)   Lumbar facet syndrome   Orthostatic hypotension   Ischemic stroke (HCC)   Prostate cancer (HCC)   Upper GI bleed    Morbid obesity (HCC)   Taylor Austin is a 83 y.o. y/o male with symptomatic anemia with a hemoglobin of 5.1 on admission.  The patient's hemoglobin was 6.2 at 3 AM this morning.  He states that his bleeding was going on for a few weeks and was bright red blood per rectum but has stopped approximate 5 days ago when asked about the report of melena he states that the doctor had mentioned it but he did not endorse having any black stools.  He does take NSAIDs regularly.  Plan:  The patient will be set up for an EGD and colonoscopy due to his NSAID use and profound anemia with symptoms including shortness of breath.  The patient will be given a prep today and will be set up for the EGD and colonoscopy for tomorrow.  The patient and his wife have been explained the plan and have also been told that we should probably avoid NSAIDs in the future.  PPI IV twice daily  Continue serial CBCs and transfuse PRN Avoid NSAIDs Maintain 2 large-bore IV lines Please page GI with any acute hemodynamic changes, or signs of active GI bleeding   Thank you for involving me in the care of this patient.      LOS: 1 day   Midge Minium, MD, MD. Clementeen Graham 02/27/2023, 9:29 AM,  Pager [phone] 7am-5pm  Check AMION for 5pm -7am coverage and on weekends   Note: This dictation was prepared with Dragon dictation along with smaller phrase technology. Any transcriptional errors that result from this process are unintentional.

## 2023-02-27 NOTE — ED Notes (Signed)
 Meds given.

## 2023-02-27 NOTE — ED Notes (Signed)
 CCMD called and cardiac monitoring verified.

## 2023-02-27 NOTE — Progress Notes (Signed)
[facility] Progress Note  Patient: Taylor Austin    WGN:[id number]  DOA: 02/26/2023     Date of Service: the patient was seen and examined on 02/27/2023  Chief Complaint  Patient presents with   Anemia   Abdominal Pain   Brief hospital course: Taylor Austin is a 83 male with history of atrial fibrillation on Eliquis, htn, cad s/p stent placement in early 2000s, who presents for chief concern of BRBPR and shortness of breath with exertion.   Vitals in the ED showed temperature of 98, respiration of 19, heart rate 100, blood pressure 133/87, SpO2 of 95% on room air.   BMP Na 137, K 4.4, chloride 101, bicarb 23, BUN 16, s. Cr 0.87, eGFR>60 nonfasting blood glucose 129,  CBC wbc 5.0, Hb 5.1, platelets of 265.   EDP consulted GI, TRH consulted for admission and further management as below.   Assessment and Plan:  # Symptomatic acute blood loss anemia  # Suspect upper GI bleed in setting of Eliquis use for A.fib Peripheral IV: Nursing order to ensure and maintain 2 peripheral IV (prefer large-bore), held Eliquis on admission Iron and folate within normal range S/p Protonix 40 mg IV one-time dose per EDP Continue Protonix 40 mg IV twice daily  S/p 2 u PRBC transfusion, Hb 6.2 2/19 transfuse 1 unit of PRBC Goal hemoglobin is greater than 8 GI consulted, plan is for GI prep today and EGD and colonoscopy tomorrow a.m.     History of CVA (cerebrovascular accident) Home clopidogrel not resumed on admission   Mixed Alzheimer's and vascular dementia (HCC) Home donepezil 10 mg every evening resumed on admission   Type 2 diabetes mellitus with diabetic neuropathy, without long-term current use of insulin (HCC) Home long-acting insulin not ordered on admission as patient will be n.p.o. after midnight AM team to resume home long-acting insulin when the benefits outweigh the risk Insulin SSI with at bedtime coverage ordered Goal inpatient blood glucose level is 140-180    Morbid obesity (HCC) This complicates overall care and prognosis.    Prostate cancer (HCC) Finasteride 5 mg daily, tamsulosin 0.4 mg daily with supper resumed on admission   Gastroesophageal reflux disease without esophagitis Protonix 40 mg IV twice daily   HLD (hyperlipidemia) Home atorvastatin 40 mg every evening resumed    Benign essential HTN 2/19 resumed losartan 50 mg p.o. daily with holding parameters Hydralazine 5 mg IV every 6 hours as needed for SBP greater 165, 5 days ordered   Depression Home duloxetine 60 mg daily with supper resumed on admission   There is no height or weight on file to calculate BMI.    Interventions:  Diet: Clear liquid diet DVT Prophylaxis: SCD, pharmacological prophylaxis contraindicated due to GI bleeding    Advance goals of care discussion: Full code  Family Communication: family was not present at bedside, at the time of interview.  The pt provided permission to discuss medical plan with the family. Opportunity was given to ask question and all questions were answered satisfactorily.   Disposition:  Pt is from SNF, admitted with GI bleeding, GI consulted, needs EGD and colonoscopy which will be done tomorrow a.m., which precludes a safe discharge. Discharge to SNF, when stable and cleared by GI.  Subjective: No significant events overnight, patient denies any abdominal pain, no nausea vomiting.  No GI bleeding at this time. Patient was seen by GI, agreed for EGD and colonoscopy. Patient has underlying dementia, he forgot when he was supposed  to get scope done.  Physical Exam: General: NAD, lying comfortably Appear in no distress, affect appropriate Eyes: PERRLA ENT: Oral Mucosa Clear, moist  Neck: no JVD,  Cardiovascular: S1 and S2 Present, no Murmur,  Respiratory: good respiratory effort, Bilateral Air entry equal and Decreased, no Crackles, no wheezes Abdomen: Bowel Sound present, Soft and no tenderness,  Skin: no  rashes Extremities: no Pedal edema, no calf tenderness Neurologic: without any new focal findings Gait not checked due to patient safety concerns  Vitals:   02/27/23 1145 02/27/23 1200 02/27/23 1215 02/27/23 1229  BP: (!) 164/74 (!) 178/79 (!) 165/75 (!) 173/87  Pulse: 95 89 83 84  Resp: (!) 25 (!) 21 18 19   Temp:    98.4 F (36.9 C)  TempSrc:    Oral  SpO2: 100% 100% 100% 100%   No intake or output data in the 24 hours ending 02/27/23 1431 There were no vitals filed for this visit.  Data Reviewed: I have personally reviewed and interpreted daily labs, tele strips, imagings as discussed above. I reviewed all nursing notes, pharmacy notes, vitals, pertinent old records I have discussed plan of care as described above with RN and patient/family.  CBC: Recent Labs  Lab 02/26/23 1409 02/27/23 0316  WBC 5.0 5.2  HGB 5.1* 6.2*  HCT 18.6* 19.8*  MCV 84.5 79.5*  PLT 265 218   Basic Metabolic Panel: Recent Labs  Lab 02/26/23 1409 02/27/23 0316  NA 137 136  K 4.4 4.4  CL 101 104  CO2 23 24  GLUCOSE 129* 61*  BUN 16 13  CREATININE 0.87 0.84  CALCIUM 9.7 9.0  MG  --  1.7  PHOS  --  4.6    Studies: CT ABDOMEN PELVIS W CONTRAST Result Date: 02/26/2023 CLINICAL DATA:  Abdominal pain. EXAM: CT ABDOMEN AND PELVIS WITH CONTRAST TECHNIQUE: Multidetector CT imaging of the abdomen and pelvis was performed using the standard protocol following bolus administration of intravenous contrast. RADIATION DOSE REDUCTION: This exam was performed according to the departmental dose-optimization program which includes automated exposure control, adjustment of the mA and/or kV according to patient size and/or use of iterative reconstruction technique. CONTRAST:  OMNIPAQUE IOHEXOL 300 MG/ML  SOLN COMPARISON:  CT dated 11/19/2020. FINDINGS: Lower chest: The visualized lung bases are clear. There is coronary vascular calcification. No intra-abdominal free air or free fluid. Hepatobiliary: The  liver is unremarkable. No biliary dilatation. No calcified gallstone or pericholecystic fluid. Pancreas: Unremarkable. No pancreatic ductal dilatation or surrounding inflammatory changes. Spleen: Normal in size without focal abnormality. Adrenals/Urinary Tract: The adrenal glands unremarkable. Vascular calcifications versus punctate nonobstructing bilateral renal calculi. Small left renal upper pole cyst. There is mild bilateral renal parenchyma atrophy. There is no hydronephrosis on either side. There is symmetric enhancement and excretion of contrast by both kidneys. The visualized ureters appear unremarkable. The urinary bladder is minimally distended. There is mildly thickened and trabeculated appearance of the bladder wall with perivesical haziness. Correlation with urinalysis recommended to exclude cystitis. Stomach/Bowel: There is sigmoid diverticulosis and scattered colonic diverticula. Moderate stool throughout the colon. There is no bowel obstruction or active inflammation. The appendix is normal. Vascular/Lymphatic: Moderate aortoiliac atherosclerotic disease. The IVC is unremarkable. No portal venous gas. There is no adenopathy. Reproductive: The prostate and seminal vesicles are grossly unremarkable. Other: Small fat containing bilateral inguinal hernias. Musculoskeletal: Osteopenia with degenerative changes. No acute osseous pathology. IMPRESSION: 1. Sigmoid diverticulosis. No bowel obstruction. Normal appendix. 2. Mildly thickened and trabeculated appearance of the  bladder wall with perivesical haziness. Correlation with urinalysis recommended to exclude cystitis. 3.  Aortic Atherosclerosis (ICD10-I70.0). Electronically Signed   By: Elgie Collard M.D.   On: 02/26/2023 17:15    Scheduled Meds:  atorvastatin  40 mg Oral QHS   calcium carbonate  500 mg of elemental calcium Oral Q lunch   cyanocobalamin  1,000 mcg Oral Q lunch   donepezil  10 mg Oral QPM   finasteride  5 mg Oral Daily    gabapentin  100 mg Oral 5 X Daily   insulin aspart  0-15 Units Subcutaneous TID WC   insulin aspart  0-5 Units Subcutaneous QHS   losartan  50 mg Oral Daily   multivitamin with minerals  1 tablet Oral Q lunch   pantoprazole (PROTONIX) IV  40 mg Intravenous Q12H   tamsulosin  0.4 mg Oral QPC supper   Continuous Infusions:  sodium chloride     sodium chloride     PRN Meds: acetaminophen **OR** acetaminophen, hydrALAZINE, ondansetron **OR** ondansetron (ZOFRAN) IV, oxymetazoline, senna-docusate  Time spent: 55 minutes  Author: Gillis Santa. MD [facility] 02/27/2023 2:31 PM  To reach On-call, see care teams to locate the attending and reach out to them via [email or web address]. If 7PM-7AM, please contact night-coverage If you still have difficulty reaching the attending provider, please page the [facility] (Director on Call) for [facility] on amion for assistance.

## 2023-02-27 NOTE — ED Notes (Signed)
Pt provided of orange juice for CBG of 60

## 2023-02-27 NOTE — ED Notes (Signed)
 Pt up to bathroom.

## 2023-02-27 NOTE — ED Notes (Signed)
Pt alert, sitting up on side of bed.  No acute distress.

## 2023-02-27 NOTE — ED Notes (Signed)
Fsbs=75

## 2023-02-28 ENCOUNTER — Encounter: Payer: Self-pay | Admitting: Internal Medicine

## 2023-02-28 ENCOUNTER — Inpatient Hospital Stay: Payer: Medicare HMO | Admitting: Anesthesiology

## 2023-02-28 ENCOUNTER — Encounter
Admission: EM | Disposition: A | Discharge status: Home / Self Care | Payer: Self-pay | Source: Ambulatory Visit | Attending: Student

## 2023-02-28 DIAGNOSIS — K635 Polyp of colon: Secondary | ICD-10-CM

## 2023-02-28 DIAGNOSIS — K269 Duodenal ulcer, unspecified as acute or chronic, without hemorrhage or perforation: Secondary | ICD-10-CM

## 2023-02-28 DIAGNOSIS — K641 Second degree hemorrhoids: Secondary | ICD-10-CM | POA: Diagnosis not present

## 2023-02-28 DIAGNOSIS — D649 Anemia, unspecified: Secondary | ICD-10-CM | POA: Diagnosis not present

## 2023-02-28 DIAGNOSIS — K5730 Diverticulosis of large intestine without perforation or abscess without bleeding: Secondary | ICD-10-CM | POA: Diagnosis not present

## 2023-02-28 HISTORY — PX: COLONOSCOPY WITH PROPOFOL: SHX5780

## 2023-02-28 HISTORY — PX: POLYPECTOMY: SHX5525

## 2023-02-28 HISTORY — PX: ESOPHAGOGASTRODUODENOSCOPY: SHX5428

## 2023-02-28 LAB — BPAM RBC
Blood Product Expiration Date: 202503212359
Blood Product Expiration Date: 202502192359
Blood Product Expiration Date: 202503212359
ISSUE DATE / TIME: 202502191052
ISSUE DATE / TIME: 202502181753
ISSUE DATE / TIME: 202502182238
Unit Type and Rh: 5100
Unit Type and Rh: 5100
Unit Type and Rh: 9500

## 2023-02-28 LAB — TYPE AND SCREEN
ABO/RH(D): O POS
Antibody Screen: NEGATIVE
Unit division: 0
Unit division: 0
Unit division: 0

## 2023-02-28 LAB — BASIC METABOLIC PANEL
Anion gap: 10 (ref 5–15)
BUN: 8 mg/dL (ref 8–23)
CO2: 23 mmol/L (ref 22–32)
Calcium: 8.9 mg/dL (ref 8.9–10.3)
Chloride: 104 mmol/L (ref 98–111)
Creatinine, Ser: 0.83 mg/dL (ref 0.61–1.24)
GFR, Estimated: >60 mL/min (ref >60)
Glucose, Bld: 144 mg/dL — ABNORMAL HIGH (ref 70–99)
Potassium: 4.1 mmol/L (ref 3.5–5.1)
Sodium: 137 mmol/L (ref 135–145)

## 2023-02-28 LAB — CBC
HCT: 25 % — ABNORMAL LOW (ref 39.0–52.0)
Hemoglobin: 7.9 g/dL — ABNORMAL LOW (ref 13.0–17.0)
MCH: 25.6 pg — ABNORMAL LOW (ref 26.0–34.0)
MCHC: 31.6 g/dL (ref 30.0–36.0)
MCV: 81.2 fL (ref 80.0–100.0)
Platelets: 237 10*3/uL (ref 150–400)
RBC: 3.08 MIL/uL — ABNORMAL LOW (ref 4.22–5.81)
RDW: 17.3 % — ABNORMAL HIGH (ref 11.5–15.5)
WBC: 5.8 10*3/uL (ref 4.0–10.5)
nRBC: 0 % (ref 0.0–0.2)

## 2023-02-28 LAB — CBG MONITORING, ED
Glucose-Capillary: 124 mg/dL — ABNORMAL HIGH (ref 70–99)
Glucose-Capillary: 147 mg/dL — ABNORMAL HIGH (ref 70–99)

## 2023-02-28 LAB — PHOSPHORUS: Phosphorus: 3.6 mg/dL (ref 2.5–4.6)

## 2023-02-28 LAB — MAGNESIUM: Magnesium: 1.7 mg/dL (ref 1.7–2.4)

## 2023-02-28 LAB — VITAMIN B12: Vitamin B-12: 493 pg/mL (ref 180–914)

## 2023-02-28 LAB — GLUCOSE, CAPILLARY
Glucose-Capillary: 111 mg/dL — ABNORMAL HIGH (ref 70–99)
Glucose-Capillary: 142 mg/dL — ABNORMAL HIGH (ref 70–99)

## 2023-02-28 SURGERY — EGD (ESOPHAGOGASTRODUODENOSCOPY)
Anesthesia: General

## 2023-02-28 MED ORDER — PROPOFOL 500 MG/50ML IV EMUL
INTRAVENOUS | Status: DC | PRN
  Administered 2023-02-28: 100 ug/kg/min via INTRAVENOUS

## 2023-02-28 MED ORDER — SODIUM CHLORIDE 0.9 % IV SOLN: INTRAVENOUS | Status: DC

## 2023-02-28 MED ORDER — LIDOCAINE HCL (PF) 2 % IJ SOLN
INTRAMUSCULAR | Status: AC
  Filled 2023-02-28: qty 5

## 2023-02-28 MED ORDER — PROPOFOL 10 MG/ML IV BOLUS
INTRAVENOUS | Status: DC | PRN
  Administered 2023-02-28: 50 mg via INTRAVENOUS
  Administered 2023-02-28: 40 mg via INTRAVENOUS

## 2023-02-28 NOTE — Transfer of Care (Signed)
Immediate Anesthesia Transfer of Care Note  Patient: Taylor Austin  Procedure(s) Performed: ESOPHAGOGASTRODUODENOSCOPY (EGD) COLONOSCOPY WITH PROPOFOL POLYPECTOMY  Patient Location: PACU and Endoscopy Unit  Anesthesia Type:General  Level of Consciousness: awake, drowsy, and patient cooperative  Airway & Oxygen Therapy: Patient Spontanous Breathing  Post-op Assessment: Report given to RN and Post -op Vital signs reviewed and stable  Post vital signs: Reviewed and stable  Last Vitals:  Vitals Value Taken Time  BP 128/56 02/28/23 1313  Temp    Pulse 86 02/28/23 1314  Resp 20 02/28/23 1314  SpO2 98 % 02/28/23 1314  Vitals shown include unfiled device data.  Last Pain:  Vitals:   02/28/23 1109  TempSrc: Temporal  PainSc: 0-No pain         Complications: No notable events documented.

## 2023-02-28 NOTE — Op Note (Addendum)
[facility] Gastroenterology Patient Name: Taylor Austin Procedure Date: 02/28/2023 12:32 PM MRN: [id number] Account #: [id number] Date of Birth: [birth year] Admit Type: Inpatient Age: 83 Room: [facility] ENDO ROOM 4 Gender: Male Note Status: Finalized Instrument Name: Upper Endoscope [id number] Procedure:             Upper GI endoscopy Indications:           Melena Providers:             Midge Minium MD, MD Referring MD:          Teena Irani. Terance Hart, MD (Referring MD) Medicines:             Propofol per Anesthesia Complications:         No immediate complications. Procedure:             Pre-Anesthesia Assessment:                        - Prior to the procedure, a History and Physical was                         performed, and patient medications and allergies were                         reviewed. The patient's tolerance of previous                         anesthesia was also reviewed. The risks and benefits                         of the procedure and the sedation options and risks                         were discussed with the patient. All questions were                         answered, and informed consent was obtained. Prior                         Anticoagulants: The patient has taken anticoagulant                         medication, last dose was 2 days prior to procedure.                         ASA Grade Assessment: II - A patient with mild                         systemic disease. After reviewing the risks and                         benefits, the patient was deemed in satisfactory                         condition to undergo the procedure.                        After obtaining informed consent, the endoscope was  passed under direct vision. Throughout the procedure,                         the patient's blood pressure, pulse, and oxygen                         saturations were monitored continuously. The Endoscope                          was introduced through the mouth, and advanced to the                         second part of duodenum. The upper GI endoscopy was                         accomplished without difficulty. The patient tolerated                         the procedure well. Findings:      The examined esophagus was normal.      The stomach was normal.      One non-bleeding cratered duodenal ulcer with a clean ulcer base       (Forrest Class III) was found in the first portion of the duodenum. Impression:            - Normal esophagus.                        - Normal stomach.                        - Non-bleeding duodenal ulcer with a clean ulcer base                         (Forrest Class III).                        - No specimens collected. Recommendation:        - Resume previous diet.                        - Continue present medications.                        - Perform a colonoscopy today.                        - Return patient to hospital ward for ongoing care.                        - Resume regular diet. Procedure Code(s):     --- Professional ---                        [phone], Esophagogastroduodenoscopy, flexible,                         transoral; diagnostic, including collection of                         specimen(s) by brushing or washing, when performed                         (  separate procedure) Diagnosis Code(s):     --- Professional ---                        K92.1, Melena (includes Hematochezia)                        K26.9, Duodenal ulcer, unspecified as acute or                         chronic, without hemorrhage or perforation CPT copyright 2022 American Medical Association. All rights reserved. The codes documented in this report are preliminary and upon coder review may  be revised to meet current compliance requirements. Midge Minium MD, MD 02/28/2023 12:53:54 PM This report has been signed electronically. Number of Addenda: 0 Note Initiated On: 02/28/2023 12:32 PM Estimated Blood  Loss:  Estimated blood loss: none.      [facility]

## 2023-02-28 NOTE — Anesthesia Postprocedure Evaluation (Signed)
Anesthesia Post Note  Patient: Taylor Austin  Procedure(s) Performed: ESOPHAGOGASTRODUODENOSCOPY (EGD) COLONOSCOPY WITH PROPOFOL POLYPECTOMY  Patient location during evaluation: PACU Anesthesia Type: General Level of consciousness: awake and alert Pain management: pain level controlled Vital Signs Assessment: post-procedure vital signs reviewed and stable Respiratory status: spontaneous breathing, nonlabored ventilation, respiratory function stable and patient connected to nasal cannula oxygen Cardiovascular status: blood pressure returned to baseline and stable Postop Assessment: no apparent nausea or vomiting Anesthetic complications: no  No notable events documented.   Last Vitals:  Vitals:   02/28/23 1109 02/28/23 1313  BP: (!) 151/73   Pulse: 85   Resp: 17   Temp: (!) 36.1 C (!) 35.6 C  SpO2: 100%     Last Pain:  Vitals:   02/28/23 1313  TempSrc: Temporal  PainSc: 0-No pain                 Stephanie Coup

## 2023-02-28 NOTE — ED Notes (Addendum)
Pt currently on CPAP, not giving 0600 meds until Pt off BiPAP for >30 mins.

## 2023-02-28 NOTE — Op Note (Addendum)
[facility] Gastroenterology Patient Name: Taylor Austin Procedure Date: 02/28/2023 12:32 PM MRN: [id number] Account #: [id number] Date of Birth: August 17, 1940 Admit Type: Inpatient Age: 83 Room: [facility] ENDO ROOM 4 Gender: Male Note Status: Finalized Instrument Name: Prentice Docker [id number] Procedure:             Colonoscopy Indications:           Hematochezia Providers:             Midge Minium MD, MD Referring MD:          Teena Irani. Terance Hart, MD (Referring MD) Medicines:             Propofol per Anesthesia Complications:         No immediate complications. Procedure:             Pre-Anesthesia Assessment:                        - Prior to the procedure, a History and Physical was                         performed, and patient medications and allergies were                         reviewed. The patient's tolerance of previous                         anesthesia was also reviewed. The risks and benefits                         of the procedure and the sedation options and risks                         were discussed with the patient. All questions were                         answered, and informed consent was obtained. Prior                         Anticoagulants: The patient has taken no anticoagulant                         or antiplatelet agents. ASA Grade Assessment: II - A                         patient with mild systemic disease. After reviewing                         the risks and benefits, the patient was deemed in                         satisfactory condition to undergo the procedure.                        After obtaining informed consent, the colonoscope was                         passed under direct vision. Throughout the procedure,  the patient's blood pressure, pulse, and oxygen                         saturations were monitored continuously. The                         Colonoscope was introduced through the anus and                          advanced to the the cecum, identified by appendiceal                         orifice and ileocecal valve. The colonoscopy was                         performed without difficulty. The patient tolerated                         the procedure well. The quality of the bowel                         preparation was good. Findings:      The perianal and digital rectal examinations were normal.      A 4 mm polyp was found in the descending colon. The polyp was sessile.       The polyp was removed with a cold snare. Resection and retrieval were       complete. To prevent bleeding post-intervention, one hemostatic clip was       successfully placed. Clip manufacturer: [employer]. There was no       bleeding at the end of the procedure.      Non-bleeding internal hemorrhoids were found during retroflexion. The       hemorrhoids were Grade II (internal hemorrhoids that prolapse but reduce       spontaneously).      Multiple small-mouthed diverticula were found in the entire colon. Impression:            - One 4 mm polyp in the descending colon, removed with                         a cold snare. Resected and retrieved. Clip was placed.                         Clip manufacturer: [employer].                        - Non-bleeding internal hemorrhoids.                        - Diverticulosis in the entire examined colon. Recommendation:        - Resume previous diet.                        - Continue present medications.                        - Await pathology results.                        - Return patient to hospital ward  for ongoing care. Procedure Code(s):     --- Professional ---                        [phone], Colonoscopy, flexible; with removal of                         tumor(s), polyp(s), or other lesion(s) by snare                         technique Diagnosis Code(s):     --- Professional ---                        K92.1, Melena (includes Hematochezia)                         D12.4, Benign neoplasm of descending colon CPT copyright 2022 American Medical Association. All rights reserved. The codes documented in this report are preliminary and upon coder review may  be revised to meet current compliance requirements. Midge Minium MD, MD 02/28/2023 1:09:55 PM This report has been signed electronically. Number of Addenda: 0 Note Initiated On: 02/28/2023 12:32 PM Scope Withdrawal Time: 0 hours 6 minutes 23 seconds  Total Procedure Duration: 0 hours 12 minutes 7 seconds  Estimated Blood Loss:  Estimated blood loss: none.      [facility]

## 2023-02-28 NOTE — Anesthesia Preprocedure Evaluation (Addendum)
Anesthesia Evaluation  Patient identified by MRN, date of birth, ID band Patient awake    Reviewed: Allergy & Precautions, NPO status , Patient's Chart, lab work & pertinent test results  History of Anesthesia Complications Negative for: history of anesthetic complications  Airway Mallampati: III  TM Distance: >3 FB Neck ROM: full    Dental no notable dental hx.    Pulmonary shortness of breath, sleep apnea , COPD, former smoker   Pulmonary exam normal        Cardiovascular hypertension, On Medications + CAD, + Past MI, + Cardiac Stents and +CHF  + dysrhythmias Atrial Fibrillation   Echo 07/2021   IMPRESSIONS    1. Left ventricular ejection fraction, by estimation, is 65 to 70%. The left ventricle has normal function. The left ventricle has no regional wall motion abnormalities. The left ventricular internal cavity size was moderately dilated. There is moderate  concentric left ventricular hypertrophy. Left ventricular diastolic parameters are consistent with Grade I diastolic dysfunction (impaired relaxation).  2. Right ventricular systolic function is normal. The right ventricular size is normal.  3. Left atrial size was mildly dilated.  4. Right atrial size was mildly dilated.  5. The mitral valve is normal in structure. Trivial mitral valve regurgitation.  6. The aortic valve is grossly normal. Aortic valve regurgitation is not visualized.     Neuro/Psych  PSYCHIATRIC DISORDERS  Depression   Dementia CVA    GI/Hepatic Neg liver ROS,GERD  ,,  Endo/Other  diabetes    Renal/GU Renal disease  negative genitourinary   Musculoskeletal   Abdominal   Peds  Hematology  (+) Blood dyscrasia, anemia   Anesthesia Other Findings Past Medical History: No date: Allergy No date: Anemia No date: Arthritis No date: Back pain No date: Barrett esophagus No date: BPH (benign prostatic hyperplasia) No date: Chronic  kidney disease No date: COPD (chronic obstructive pulmonary disease) (HCC) No date: Coronary artery disease No date: Depression No date: Diabetes mellitus without complication (HCC) No date: Diverticulitis No date: Dysphagia No date: Esophageal reflux No date: Esophageal reflux No date: GERD (gastroesophageal reflux disease) No date: Heart murmur No date: Hyperlipidemia No date: Hypersomnia No date: Hypertension No date: Kidney stones No date: Low testosterone No date: OAB (overactive bladder) No date: Obesity No date: Presence of dental bridge     Comment:  2 - top No date: Rheumatic fever No date: Sleep apnea     Comment:  BiPAP 01/2016: Stroke (HCC) No date: Vertigo No date: Vitamin B12 deficiency  Past Surgical History: No date: BACK SURGERY 08/11/2014: CARDIAC CATHETERIZATION     Comment:  Procedure: Left Heart Cath and Coronary Angiography;                Surgeon: Dalia Heading, MD;  Location: [facility] INVASIVE CV               LAB;  Service: Cardiovascular;; 11/17/2014: CARDIAC CATHETERIZATION; N/A     Comment:  Procedure: Right Heart Cath;  Surgeon: Dalia Heading,               MD;  Location: [facility] INVASIVE CV LAB;  Service:               Cardiovascular;  Laterality: N/A; No date: CAROTID STENT 04/03/2016: CATARACT EXTRACTION W/PHACO; Left     Comment:  Procedure: CATARACT EXTRACTION PHACO AND INTRAOCULAR               LENS PLACEMENT (IOC) left diabetic;  Surgeon: Nevada Crane, MD;  Location: [facility];  Service:               Ophthalmology;  Laterality: Left;  diabetic - oral               meds sleep apnea 05/01/2016: CATARACT EXTRACTION W/PHACO; Right     Comment:  Procedure: CATARACT EXTRACTION PHACO AND INTRAOCULAR               LENS PLACEMENT (IOC) Right diabetic;  Surgeon: Nevada Crane, MD;  Location: [facility];  Service:               Ophthalmology;  Laterality: Right;  Diabetic oral                meds sleep apnea No date: CERVICAL SPINE SURGERY No date: COLONOSCOPY 06/10/2015: COLONOSCOPY WITH PROPOFOL; N/A     Comment:  Procedure: COLONOSCOPY WITH PROPOFOL;  Surgeon: Scot Jun, MD;  Location[facility] ENDOSCOPY;  Service:               Endoscopy;  Laterality: N/A; No date: CORONARY ANGIOPLASTY 06/28/2017: ESOPHAGOGASTRODUODENOSCOPY (EGD) WITH PROPOFOL; N/A     Comment:  Procedure: ESOPHAGOGASTRODUODENOSCOPY (EGD) WITH               PROPOFOL;  Surgeon: Scot Jun, MD;  Location:               [facility] ENDOSCOPY;  Service: Endoscopy;  Laterality: N/A; No date: ESOPHAGOGASTRODUODENOSCOPY ENDOSCOPY No date: EYE SURGERY No date: LUMBAR SPINE SURGERY No date: THORACIC SPINE SURGERY No date: TONSILLECTOMY     Reproductive/Obstetrics negative OB ROS                             Anesthesia Physical Anesthesia Plan  ASA: 3  Anesthesia Plan: General   Post-op Pain Management: Minimal or no pain anticipated   Induction: Intravenous  PONV Risk Score and Plan: 1 and Propofol infusion and TIVA  Airway Management Planned: Natural Airway and Nasal Cannula  Additional Equipment:   Intra-op Plan:   Post-operative Plan:   Informed Consent: I have reviewed the patients History and Physical, chart, labs and discussed the procedure including the risks, benefits and alternatives for the proposed anesthesia with the patient or authorized representative who has indicated his/her understanding and acceptance.     Dental Advisory Given  Plan Discussed with: Anesthesiologist, CRNA and Surgeon  Anesthesia Plan Comments: (Patient consented for risks of anesthesia including but not limited to:  - adverse reactions to medications - risk of airway placement if required - damage to eyes, teeth, lips or other oral mucosa - nerve damage due to positioning  - sore throat or hoarseness - Damage to heart, brain, nerves, lungs, other parts of body or  loss of life  Patient voiced understanding and assent.)        Anesthesia Quick Evaluation

## 2023-02-28 NOTE — Progress Notes (Signed)
The patient had an upper endoscopy and colonoscopy for GI bleeding that showed the patient to have a duodenal ulcer with a clean base without any visible vessel or any fresh or old blood seen.  The patient also had some hemorrhoids diverticulosis throughout his colon.  There is no sign of any active bleeding.  Nothing further to do from a GI point of view.  I will sign off.  Please call if any further GI concerns or questions.  We would like to thank you for the opportunity to participate in the care of Taylor Austin.

## 2023-02-28 NOTE — ED Notes (Signed)
Endo RN here to transport patient off the unit.

## 2023-02-28 NOTE — Progress Notes (Signed)
[facility] Progress Note  Patient: Taylor Austin    MWU:[id number]  DOA: 02/26/2023     Date of Service: the patient was seen and examined on 02/28/2023  Chief Complaint  Patient presents with   Anemia   Abdominal Pain   Brief hospital course: Mr. Huxton Glaus is a 30 male with history of atrial fibrillation on Eliquis, htn, cad s/p stent placement in early 2000s, who presents for chief concern of BRBPR and shortness of breath with exertion.   Vitals in the ED showed temperature of 98, respiration of 19, heart rate 100, blood pressure 133/87, SpO2 of 95% on room air.   BMP Na 137, K 4.4, chloride 101, bicarb 23, BUN 16, s. Cr 0.87, eGFR>60 nonfasting blood glucose 129,  CBC wbc 5.0, Hb 5.1, platelets of 265.   EDP consulted GI, TRH consulted for admission and further management as below.   Assessment and Plan:  # Symptomatic acute blood loss anemia  # Suspect upper GI bleed in setting of Eliquis use for A.fib Peripheral IV: Nursing order to ensure and maintain 2 peripheral IV (prefer large-bore), held Eliquis on admission Iron and folate within normal range S/p Protonix 40 mg IV one-time dose per EDP Continue Protonix 40 mg IV twice daily  S/p 2 u PRBC transfusion, Hb 6.2 2/19 transfuse 1 unit of PRBC Goal hemoglobin is greater than 8 GI consulted, s/p EGD, normal esophagus and stomach, nonbleeding duodenal ulcer with a clean ulcer base (Forrest class III) S/p colonoscopy: One 4 mm polyp in the descending colon, removed with  a cold snare. Resected and retrieved. Clip was placed.  Nonbleeding internal hemorrhoids and diverticulosis. GI recommended to wait for the biopsy.    History of CVA (cerebrovascular accident) Home clopidogrel not resumed on admission   Mixed Alzheimer's and vascular dementia (HCC) Home donepezil 10 mg every evening resumed on admission   Type 2 diabetes mellitus with diabetic neuropathy, without long-term current use of insulin (HCC) Home  long-acting insulin not ordered on admission as patient will be n.p.o. after midnight AM team to resume home long-acting insulin when the benefits outweigh the risk Insulin SSI with at bedtime coverage ordered Goal inpatient blood glucose level is 140-180   Morbid obesity (HCC) This complicates overall care and prognosis.    Prostate cancer (HCC) Finasteride 5 mg daily, tamsulosin 0.4 mg daily with supper resumed on admission   Gastroesophageal reflux disease without esophagitis Protonix 40 mg IV twice daily   HLD (hyperlipidemia) Home atorvastatin 40 mg every evening resumed    Benign essential HTN 2/19 resumed losartan 50 mg p.o. daily with holding parameters Hydralazine 5 mg IV every 6 hours as needed for SBP greater 165, 5 days ordered   Depression Home duloxetine 60 mg daily with supper resumed on admission   There is no height or weight on file to calculate BMI.    Interventions:  Diet: Clear liquid diet DVT Prophylaxis: SCD, pharmacological prophylaxis contraindicated due to GI bleeding    Advance goals of care discussion: Full code  Family Communication: family was not present at bedside, at the time of interview.  The pt provided permission to discuss medical plan with the family. Opportunity was given to ask question and all questions were answered satisfactorily.   Disposition:  Pt is from SNF, admitted with GI bleeding, GI consulted, s/p EGD and colonoscopy scheduled today, which precludes a safe discharge. Discharge to SNF, when stable and cleared by GI.  Most likely discharge tomorrow a.m.  Subjective: No significant events overnight, patient awaiting for EGD and colonoscopy today Denies any active issues.  No active bleeding  Physical Exam: General: NAD, lying comfortably Appear in no distress, affect appropriate Eyes: PERRLA ENT: Oral Mucosa Clear, moist  Neck: no JVD,  Cardiovascular: S1 and S2 Present, no Murmur,  Respiratory: good respiratory  effort, Bilateral Air entry equal and Decreased, no Crackles, no wheezes Abdomen: Bowel Sound present, Soft and no tenderness,  Skin: no rashes Extremities: no Pedal edema, no calf tenderness Neurologic: without any new focal findings Gait not checked due to patient safety concerns  Vitals:   02/28/23 1109 02/28/23 1313 02/28/23 1333 02/28/23 1502  BP: (!) 151/73  (!) 125/55 (!) 150/68  Pulse: 85  73 80  Resp: 17  18 17   Temp: (!) 96.9 F (36.1 C) (!) 96.1 F (35.6 C)  98 F (36.7 C)  TempSrc: Temporal Temporal  Oral  SpO2: 100%  99% 96%    Intake/Output Summary (Last 24 hours) at 02/28/2023 1604 Last data filed at 02/28/2023 1258 Gross per 24 hour  Intake 300 ml  Output 0 ml  Net 300 ml   There were no vitals filed for this visit.  Data Reviewed: I have personally reviewed and interpreted daily labs, tele strips, imagings as discussed above. I reviewed all nursing notes, pharmacy notes, vitals, pertinent old records I have discussed plan of care as described above with RN and patient/family.  CBC: Recent Labs  Lab 02/26/23 1409 02/27/23 0316 02/27/23 1435 02/27/23 2121 02/28/23 0244  WBC 5.0 5.2  --   --  5.8  HGB 5.1* 6.2* 7.5* 7.2* 7.9*  HCT 18.6* 19.8* 23.8* 22.3* 25.0*  MCV 84.5 79.5*  --   --  81.2  PLT 265 218  --   --  237   Basic Metabolic Panel: Recent Labs  Lab 02/26/23 1409 02/27/23 0316 02/28/23 0244  NA 137 136 137  K 4.4 4.4 4.1  CL 101 104 104  CO2 23 24 23   GLUCOSE 129* 61* 144*  BUN 16 13 8   CREATININE 0.87 0.84 0.83  CALCIUM 9.7 9.0 8.9  MG  --  1.7 1.7  PHOS  --  4.6 3.6    Studies: No results found.   Scheduled Meds:  atorvastatin  40 mg Oral QHS   calcium carbonate  500 mg of elemental calcium Oral Q lunch   cyanocobalamin  1,000 mcg Oral Q lunch   donepezil  10 mg Oral QPM   finasteride  5 mg Oral Daily   gabapentin  100 mg Oral 5 X Daily   insulin aspart  0-15 Units Subcutaneous TID WC   insulin aspart  0-5 Units  Subcutaneous QHS   losartan  50 mg Oral Daily   multivitamin with minerals  1 tablet Oral Q lunch   pantoprazole (PROTONIX) IV  40 mg Intravenous Q12H   tamsulosin  0.4 mg Oral QPC supper   Continuous Infusions:  sodium chloride     sodium chloride     PRN Meds: acetaminophen **OR** acetaminophen, hydrALAZINE, ondansetron **OR** ondansetron (ZOFRAN) IV, oxymetazoline, senna-docusate  Time spent: 40 minutes  Author: Gillis Santa. MD [facility] 02/28/2023 4:04 PM  To reach On-call, see care teams to locate the attending and reach out to them via [email or web address]. If 7PM-7AM, please contact night-coverage If you still have difficulty reaching the attending provider, please page the [facility] (Director on Call) for [facility] on amion for assistance.

## 2023-03-01 ENCOUNTER — Encounter: Payer: Self-pay | Admitting: Gastroenterology

## 2023-03-01 DIAGNOSIS — D649 Anemia, unspecified: Secondary | ICD-10-CM | POA: Diagnosis not present

## 2023-03-01 LAB — BASIC METABOLIC PANEL
Anion gap: 4 — ABNORMAL LOW (ref 5–15)
BUN: 7 mg/dL — ABNORMAL LOW (ref 8–23)
CO2: 29 mmol/L (ref 22–32)
Calcium: 9 mg/dL (ref 8.9–10.3)
Chloride: 108 mmol/L (ref 98–111)
Creatinine, Ser: 0.77 mg/dL (ref 0.61–1.24)
GFR, Estimated: >60 mL/min (ref >60)
Glucose, Bld: 152 mg/dL — ABNORMAL HIGH (ref 70–99)
Potassium: 4.3 mmol/L (ref 3.5–5.1)
Sodium: 141 mmol/L (ref 135–145)

## 2023-03-01 LAB — CBC
HCT: 24.9 % — ABNORMAL LOW (ref 39.0–52.0)
Hemoglobin: 7.6 g/dL — ABNORMAL LOW (ref 13.0–17.0)
MCH: 25.5 pg — ABNORMAL LOW (ref 26.0–34.0)
MCHC: 30.5 g/dL (ref 30.0–36.0)
MCV: 83.6 fL (ref 80.0–100.0)
Platelets: 215 10*3/uL (ref 150–400)
RBC: 2.98 MIL/uL — ABNORMAL LOW (ref 4.22–5.81)
RDW: 17.8 % — ABNORMAL HIGH (ref 11.5–15.5)
WBC: 5.5 10*3/uL (ref 4.0–10.5)
nRBC: 0 % (ref 0.0–0.2)

## 2023-03-01 LAB — SURGICAL PATHOLOGY

## 2023-03-01 LAB — MAGNESIUM: Magnesium: 1.7 mg/dL (ref 1.7–2.4)

## 2023-03-01 LAB — PHOSPHORUS: Phosphorus: 3.3 mg/dL (ref 2.5–4.6)

## 2023-03-01 LAB — GLUCOSE, CAPILLARY: Glucose-Capillary: 132 mg/dL — ABNORMAL HIGH (ref 70–99)

## 2023-03-01 MED ORDER — PANTOPRAZOLE SODIUM 40 MG PO TBEC: 40.0000 mg | DELAYED_RELEASE_TABLET | Freq: Two times a day (BID) | ORAL | 0 refills | Status: DC

## 2023-03-01 NOTE — Discharge Summary (Signed)
 [facility] Discharge Summary   Patient: Taylor Austin:[id number]  PCP: Dorothey Baseman, MD  Date of admission: 02/26/2023   Date of discharge:  03/01/2023     Discharge Diagnoses:  Principal Problem:   Symptomatic anemia Active Problems:   DDD (degenerative disc disease), lumbosacral   DJD (degenerative joint disease) of cervical spine   Lumbar facet joint pain (Bilateral)   Lumbar facet syndrome   Upper GI bleed   History of CVA (cerebrovascular accident)   Orthostatic hypotension   Type 2 diabetes mellitus with diabetic neuropathy, without long-term current use of insulin (HCC)   Mixed Alzheimer's and vascular dementia (HCC)   Clinical depression   Benign essential HTN   HLD (hyperlipidemia)   Gastroesophageal reflux disease without esophagitis   History of Barrett's esophagus   Memory loss, short term   Chronic pain syndrome   Ischemic stroke (HCC)   Prostate cancer (HCC)   Morbid obesity (HCC)   Polyp of descending colon   Duodenal ulceration   Admitted From: Home Disposition:  Home   Recommendations for Outpatient Follow-up:  F/u with PCP in 1 wk, cbc in 1 wk F/u GI in 1 wk for biopsy report Follow up LABS/TEST:  As above   Follow-up Information     Dorothey Baseman, MD Follow up in 1 week(s).   Specialty: Family Medicine Why: 1 week Follow-up appointment made for 03-08-2023 @ Contact information: [address] [place] [address] [phone]                Diet recommendation: Cardiac and Carb modified diet  Activity: The patient is advised to gradually reintroduce usual activities, as tolerated  Discharge Condition: stable  Code Status: Full code   History of present illness: As per the H and P dictated on admission Hospital Course:  [name] is a 48 male with history of atrial fibrillation on Eliquis, htn, cad s/p stent placement in early 2000s, who presents for chief concern of BRBPR and shortness of breath with  exertion.   Vitals in the ED showed temperature of 98, respiration of 19, heart rate 100, blood pressure 133/87, SpO2 of 95% on room air.   BMP Na 137, K 4.4, chloride 101, bicarb 23, BUN 16, s. Cr 0.87, eGFR>60 nonfasting blood glucose 129,  CBC wbc 5.0, Hb 5.1, platelets of 265.   EDP consulted GI, TRH consulted for admission and further management as below.   Assessment and Plan:   # Symptomatic acute blood loss anemia  # Suspect upper GI bleed in setting of Eliquis use for A.fib Peripheral IV: Nursing order to ensure and maintain 2 peripheral IV (prefer large-bore), held Eliquis on admission. Iron and folate within normal range S/p Protonix 40 mg IV BID. Hb 6.2 on admission, patient received total 3 units of PRBC transfusion.  GI consulted, s/p EGD, normal esophagus and stomach, nonbleeding duodenal ulcer with a clean ulcer base (Forrest class III) S/p colonoscopy: One 4 mm polyp in the descending colon, removed with  a cold snare. Resected and retrieved. Clip was placed.  Nonbleeding internal hemorrhoids and diverticulosis.  Follow-up with GI for biopsy report. Patient was discharged on pantoprazole 40 mg p.o. twice daily for 4 weeks followed by pantoprazole 40 mg p.o. daily.  Avoid NSAIDs.   # History of CVA (cerebrovascular accident) resumed Lipitor 40 mg p.o. daily home dose. Patient was taking Plavix which was discontinued due to risk of bleeding and no strong indication at this time. #  Mixed Alzheimer's and vascular dementia. Home donepezil 10 mg every evening resumed on admission # Type 2 diabetes mellitus with diabetic neuropathy, without long-term current use of insulin.  Resumed home regimen on discharge.  Patient was advised to monitor CBG at home, continue diabetic diet and follow with PCP. # Prostate cancer: Finasteride 5 mg daily, tamsulosin 0.4 mg daily with supper resumed on admission # Gastroesophageal reflux disease without esophagitis, on PPI # HLD (hyperlipidemia):  Home atorvastatin 40 mg every evening resumed # Benign essential HTN. On 2/19 resumed losartan 50 mg p.o. daily with holding parameters.  # Depression: Home duloxetine 60 mg daily with supper resumed on admission  There is no height or weight on file to calculate BMI.  Nutrition Interventions:  - Patient was instructed, not to drive, operate heavy machinery, perform activities at heights, swimming or participation in water activities or provide baby sitting services while on Pain, Sleep and Anxiety Medications; until his outpatient Physician has advised to do so again.  - Also recommended to not to take more than prescribed Pain, Sleep and Anxiety Medications.  Patient was ambulatory without any assistance. On the day of the discharge the patient's vitals were stable, and no other acute medical condition were reported by patient. the patient was felt safe to be discharge at Home.  Consultants: GI Procedures: s/p EGD and colonoscopy as above  Discharge Exam: General: Appear in no distress, no Rash; Oral Mucosa Clear, moist. Cardiovascular: S1 and S2 Present, no Murmur, Respiratory: normal respiratory effort, Bilateral Air entry present and no Crackles, no wheezes Abdomen: Bowel Sound present, Soft and no tenderness, no hernia Extremities: no Pedal edema, no calf tenderness Neurology: alert and oriented to time, place, and person affect appropriate.  There were no vitals filed for this visit. Vitals:   03/01/23 0754 03/01/23 1155  BP: (!) 122/90 (!) 130/54  Pulse: 90 90  Resp: 16 16  Temp: 98.2 F (36.8 C) 98.2 F (36.8 C)  SpO2: 94% 98%    DISCHARGE MEDICATION: Allergies as of 03/01/2023       Reactions   Hydrocodone Shortness Of Breath   Oxycodone-acetaminophen Shortness Of Breath, Swelling   Confirmed with wife- tongue swelling and SOB after oxycodone        Medication List     STOP taking these medications    aspirin EC 81 MG tablet    aspirin-acetaminophen-caffeine 250-250-65 MG tablet Commonly known as: EXCEDRIN MIGRAINE   clopidogrel 75 MG tablet Commonly known as: PLAVIX       TAKE these medications    atorvastatin 40 MG tablet Commonly known as: LIPITOR Take 1 tablet by mouth daily. What changed: Another medication with the same name was removed. Continue taking this medication, and follow the directions you see here.   calcium carbonate 1500 (600 Ca) MG Tabs tablet Commonly known as: OSCAL Take 600 mg of elemental calcium by mouth daily with lunch.   cyanocobalamin 1000 MCG tablet Commonly known as: VITAMIN B12 Take 1,000 mcg by mouth daily with lunch.   donepezil 10 MG tablet Commonly known as: ARICEPT Take 10 mg by mouth every evening.   DULoxetine 60 MG capsule Commonly known as: CYMBALTA Take 60 mg by mouth daily with supper.   Eliquis 5 MG Tabs tablet Generic drug: apixaban Take 1 tablet (5 mg total) by mouth 2 (two) times daily. Transition to warfarin as per cardio.  As per patient's wife they will transition to warfarin after finishing the Eliquis. Start taking on: March 02, 2023 What changed: additional instructions   finasteride 5 MG tablet Commonly known as: PROSCAR TAKE 1 TABLET EVERY DAY   gabapentin 100 MG capsule Commonly known as: NEURONTIN Take 100 mg by mouth 5 (five) times daily.   glimepiride 4 MG tablet Commonly known as: AMARYL Take 2 mg by mouth in the morning.   losartan 50 MG tablet Commonly known as: COZAAR Take 25 mg by mouth daily.   magnesium oxide 400 MG tablet Commonly known as: MAG-OX Take 400 mg by mouth daily with lunch.   metFORMIN 500 MG 24 hr tablet Commonly known as: GLUCOPHAGE-XR Take 1,000 mg by mouth 2 (two) times daily with a meal.   Multi-Vitamins Tabs Take 1 tablet by mouth daily with lunch.   oxymetazoline 0.05 % nasal spray Commonly known as: AFRIN Place 1 spray into both nostrils 2 (two) times daily as needed for  congestion.   pantoprazole 40 MG tablet Commonly known as: PROTONIX Take 1 tablet (40 mg total) by mouth 2 (two) times daily for 28 days. What changed: You were already taking a medication with the same name, and this prescription was added. Make sure you understand how and when to take each.   pantoprazole 40 MG tablet Commonly known as: PROTONIX Take 1 tablet (40 mg total) by mouth daily. Start taking on: March 29, 2023 What changed: These instructions start on March 29, 2023. If you are unsure what to do until then, ask your doctor or other care provider.   pioglitazone 15 MG tablet Commonly known as: ACTOS Take 45 mg by mouth every evening.   sildenafil 20 MG tablet Commonly known as: REVATIO Take 20 mg by mouth 3 (three) times daily.   sildenafil 20 MG tablet Commonly known as: REVATIO 1-5 tablets as needed one hour prior to intercourse   tamsulosin 0.4 MG Caps capsule Commonly known as: FLOMAX Take 1 capsule (0.4 mg total) by mouth daily after supper.   Tiotropium Bromide Monohydrate 2.5 MCG/ACT Aers Inhale 2 each into the lungs at bedtime.   Spiriva Respimat 2.5 MCG/ACT Aers Generic drug: Tiotropium Bromide Monohydrate Inhale into the lungs.   warfarin 3 MG tablet Commonly known as: COUMADIN Take 3 mg by mouth daily.   Xultophy 100-3.6 UNIT-MG/ML Sopn Generic drug: Insulin Degludec-Liraglutide Inject 50 Units into the skin daily. (Only use if blood glucose is >70)       Allergies  Allergen Reactions   Hydrocodone Shortness Of Breath   Oxycodone-Acetaminophen Shortness Of Breath and Swelling    Confirmed with wife- tongue swelling and SOB after oxycodone   Discharge Instructions     Call MD for:   Complete by: As directed    GI Bleeding   Call MD for:  difficulty breathing, headache or visual disturbances   Complete by: As directed    Call MD for:  extreme fatigue   Complete by: As directed    Call MD for:  persistant dizziness or light-headedness    Complete by: As directed    Call MD for:  persistant nausea and vomiting   Complete by: As directed    Call MD for:  severe uncontrolled pain   Complete by: As directed    Call MD for:  temperature >100.4   Complete by: As directed    Diet - low sodium heart healthy   Complete by: As directed    Discharge instructions   Complete by: As directed    F/u with PCP in 1 wk, cbc in  1 wk F/u GI in 1 wk for biopsy report   Increase activity slowly   Complete by: As directed        The results of significant diagnostics from this hospitalization (including imaging, microbiology, ancillary and laboratory) are listed below for reference.    Significant Diagnostic Studies: CT ABDOMEN PELVIS W CONTRAST Result Date: 02/26/2023 CLINICAL DATA:  Abdominal pain. EXAM: CT ABDOMEN AND PELVIS WITH CONTRAST TECHNIQUE: Multidetector CT imaging of the abdomen and pelvis was performed using the standard protocol following bolus administration of intravenous contrast. RADIATION DOSE REDUCTION: This exam was performed according to the departmental dose-optimization program which includes automated exposure control, adjustment of the mA and/or kV according to patient size and/or use of iterative reconstruction technique. CONTRAST:  OMNIPAQUE IOHEXOL 300 MG/ML  SOLN COMPARISON:  CT dated 11/19/2020. FINDINGS: Lower chest: The visualized lung bases are clear. There is coronary vascular calcification. No intra-abdominal free air or free fluid. Hepatobiliary: The liver is unremarkable. No biliary dilatation. No calcified gallstone or pericholecystic fluid. Pancreas: Unremarkable. No pancreatic ductal dilatation or surrounding inflammatory changes. Spleen: Normal in size without focal abnormality. Adrenals/Urinary Tract: The adrenal glands unremarkable. Vascular calcifications versus punctate nonobstructing bilateral renal calculi. Small left renal upper pole cyst. There is mild bilateral renal parenchyma atrophy.  There is no hydronephrosis on either side. There is symmetric enhancement and excretion of contrast by both kidneys. The visualized ureters appear unremarkable. The urinary bladder is minimally distended. There is mildly thickened and trabeculated appearance of the bladder wall with perivesical haziness. Correlation with urinalysis recommended to exclude cystitis. Stomach/Bowel: There is sigmoid diverticulosis and scattered colonic diverticula. Moderate stool throughout the colon. There is no bowel obstruction or active inflammation. The appendix is normal. Vascular/Lymphatic: Moderate aortoiliac atherosclerotic disease. The IVC is unremarkable. No portal venous gas. There is no adenopathy. Reproductive: The prostate and seminal vesicles are grossly unremarkable. Other: Small fat containing bilateral inguinal hernias. Musculoskeletal: Osteopenia with degenerative changes. No acute osseous pathology. IMPRESSION: 1. Sigmoid diverticulosis. No bowel obstruction. Normal appendix. 2. Mildly thickened and trabeculated appearance of the bladder wall with perivesical haziness. Correlation with urinalysis recommended to exclude cystitis. 3.  Aortic Atherosclerosis (ICD10-I70.0). Electronically Signed   By: Elgie Collard M.D.   On: 02/26/2023 17:15    Microbiology: No results found for this or any previous visit (from the past 240 hours).   Labs: CBC: Recent Labs  Lab 02/26/23 1409 02/27/23 0316 02/27/23 1435 02/27/23 2121 02/28/23 0244 03/01/23 0625  WBC 5.0 5.2  --   --  5.8 5.5  HGB 5.1* 6.2* 7.5* 7.2* 7.9* 7.6*  HCT 18.6* 19.8* 23.8* 22.3* 25.0* 24.9*  MCV 84.5 79.5*  --   --  81.2 83.6  PLT 265 218  --   --  237 215   Basic Metabolic Panel: Recent Labs  Lab 02/26/23 1409 02/27/23 0316 02/28/23 0244 03/01/23 0625  NA 137 136 137 141  K 4.4 4.4 4.1 4.3  CL 101 104 104 108  CO2 23 24 23 29   GLUCOSE 129* 61* 144* 152*  BUN 16 13 8  7*  CREATININE 0.87 0.84 0.83 0.77  CALCIUM 9.7 9.0 8.9  9.0  MG  --  1.7 1.7 1.7  PHOS  --  4.6 3.6 3.3   Liver Function Tests: Recent Labs  Lab 02/26/23 1409  AST 24  ALT 15  ALKPHOS 44  BILITOT 0.5  PROT 6.6  ALBUMIN 3.8   No results for input(s): "LIPASE", "AMYLASE" in the last  168 hours. No results for input(s): "AMMONIA" in the last 168 hours. Cardiac Enzymes: No results for input(s): "CKTOTAL", "CKMB", "CKMBINDEX", "TROPONINI" in the last 168 hours. BNP (last 3 results) No results for input(s): "BNP" in the last 8760 hours. CBG: Recent Labs  Lab 02/28/23 0737 02/28/23 1112 02/28/23 1505 02/28/23 2221 03/01/23 1155  GLUCAP 124* 147* 111* 142* 132*    Time spent: 35 minutes  Signed:  Gillis Santa  [facility] 03/01/2023 12:52 PM

## 2023-03-02 ENCOUNTER — Other Ambulatory Visit: Payer: Self-pay | Admitting: Urology

## 2023-03-05 LAB — GLUCOSE, CAPILLARY: Glucose-Capillary: 131 mg/dL — ABNORMAL HIGH (ref 70–99)

## 2023-03-18 DIAGNOSIS — I4891 Unspecified atrial fibrillation: Secondary | ICD-10-CM | POA: Diagnosis not present

## 2023-03-18 DIAGNOSIS — I10 Essential (primary) hypertension: Secondary | ICD-10-CM | POA: Diagnosis not present

## 2023-03-18 DIAGNOSIS — E1169 Type 2 diabetes mellitus with other specified complication: Secondary | ICD-10-CM | POA: Diagnosis not present

## 2023-03-18 DIAGNOSIS — E1140 Type 2 diabetes mellitus with diabetic neuropathy, unspecified: Secondary | ICD-10-CM | POA: Diagnosis not present

## 2023-03-18 DIAGNOSIS — E785 Hyperlipidemia, unspecified: Secondary | ICD-10-CM | POA: Diagnosis not present

## 2023-03-18 DIAGNOSIS — K922 Gastrointestinal hemorrhage, unspecified: Secondary | ICD-10-CM | POA: Diagnosis not present

## 2023-03-18 DIAGNOSIS — D649 Anemia, unspecified: Secondary | ICD-10-CM | POA: Diagnosis not present

## 2023-03-22 DIAGNOSIS — I480 Paroxysmal atrial fibrillation: Secondary | ICD-10-CM | POA: Diagnosis not present

## 2023-03-22 DIAGNOSIS — Z8673 Personal history of transient ischemic attack (TIA), and cerebral infarction without residual deficits: Secondary | ICD-10-CM | POA: Diagnosis not present

## 2023-03-26 DIAGNOSIS — Z8673 Personal history of transient ischemic attack (TIA), and cerebral infarction without residual deficits: Secondary | ICD-10-CM | POA: Diagnosis not present

## 2023-03-26 DIAGNOSIS — I480 Paroxysmal atrial fibrillation: Secondary | ICD-10-CM | POA: Diagnosis not present

## 2023-04-01 DIAGNOSIS — Z7901 Long term (current) use of anticoagulants: Secondary | ICD-10-CM | POA: Diagnosis not present

## 2023-04-01 DIAGNOSIS — Z8673 Personal history of transient ischemic attack (TIA), and cerebral infarction without residual deficits: Secondary | ICD-10-CM | POA: Diagnosis not present

## 2023-04-09 DIAGNOSIS — Z8673 Personal history of transient ischemic attack (TIA), and cerebral infarction without residual deficits: Secondary | ICD-10-CM | POA: Diagnosis not present

## 2023-04-09 DIAGNOSIS — Z7901 Long term (current) use of anticoagulants: Secondary | ICD-10-CM | POA: Diagnosis not present

## 2023-04-09 DIAGNOSIS — D649 Anemia, unspecified: Secondary | ICD-10-CM | POA: Diagnosis not present

## 2023-04-09 DIAGNOSIS — G4733 Obstructive sleep apnea (adult) (pediatric): Secondary | ICD-10-CM | POA: Diagnosis not present

## 2023-04-09 DIAGNOSIS — J439 Emphysema, unspecified: Secondary | ICD-10-CM | POA: Diagnosis not present

## 2023-04-18 DIAGNOSIS — Z7901 Long term (current) use of anticoagulants: Secondary | ICD-10-CM | POA: Diagnosis not present

## 2023-04-18 DIAGNOSIS — E1140 Type 2 diabetes mellitus with diabetic neuropathy, unspecified: Secondary | ICD-10-CM | POA: Diagnosis not present

## 2023-04-18 DIAGNOSIS — Z8673 Personal history of transient ischemic attack (TIA), and cerebral infarction without residual deficits: Secondary | ICD-10-CM | POA: Diagnosis not present

## 2023-04-18 DIAGNOSIS — Z95818 Presence of other cardiac implants and grafts: Secondary | ICD-10-CM | POA: Diagnosis not present

## 2023-04-18 DIAGNOSIS — I4729 Other ventricular tachycardia: Secondary | ICD-10-CM | POA: Diagnosis not present

## 2023-04-18 DIAGNOSIS — I5032 Chronic diastolic (congestive) heart failure: Secondary | ICD-10-CM | POA: Diagnosis not present

## 2023-04-18 DIAGNOSIS — I2510 Atherosclerotic heart disease of native coronary artery without angina pectoris: Secondary | ICD-10-CM | POA: Diagnosis not present

## 2023-04-18 DIAGNOSIS — I480 Paroxysmal atrial fibrillation: Secondary | ICD-10-CM | POA: Diagnosis not present

## 2023-04-18 DIAGNOSIS — Z955 Presence of coronary angioplasty implant and graft: Secondary | ICD-10-CM | POA: Diagnosis not present

## 2023-04-18 DIAGNOSIS — R42 Dizziness and giddiness: Secondary | ICD-10-CM | POA: Diagnosis not present

## 2023-04-23 DIAGNOSIS — E1169 Type 2 diabetes mellitus with other specified complication: Secondary | ICD-10-CM | POA: Diagnosis not present

## 2023-04-23 DIAGNOSIS — Z794 Long term (current) use of insulin: Secondary | ICD-10-CM | POA: Diagnosis not present

## 2023-04-23 DIAGNOSIS — E785 Hyperlipidemia, unspecified: Secondary | ICD-10-CM | POA: Diagnosis not present

## 2023-04-23 DIAGNOSIS — E1140 Type 2 diabetes mellitus with diabetic neuropathy, unspecified: Secondary | ICD-10-CM | POA: Diagnosis not present

## 2023-04-23 DIAGNOSIS — I152 Hypertension secondary to endocrine disorders: Secondary | ICD-10-CM | POA: Diagnosis not present

## 2023-04-23 DIAGNOSIS — E1159 Type 2 diabetes mellitus with other circulatory complications: Secondary | ICD-10-CM | POA: Diagnosis not present

## 2023-05-01 DIAGNOSIS — Z7901 Long term (current) use of anticoagulants: Secondary | ICD-10-CM | POA: Diagnosis not present

## 2023-05-01 DIAGNOSIS — Z8673 Personal history of transient ischemic attack (TIA), and cerebral infarction without residual deficits: Secondary | ICD-10-CM | POA: Diagnosis not present

## 2023-05-02 DIAGNOSIS — I69352 Hemiplegia and hemiparesis following cerebral infarction affecting left dominant side: Secondary | ICD-10-CM | POA: Diagnosis not present

## 2023-05-02 DIAGNOSIS — F03A0 Unspecified dementia, mild, without behavioral disturbance, psychotic disturbance, mood disturbance, and anxiety: Secondary | ICD-10-CM | POA: Diagnosis not present

## 2023-05-02 DIAGNOSIS — G894 Chronic pain syndrome: Secondary | ICD-10-CM | POA: Diagnosis not present

## 2023-05-02 DIAGNOSIS — G4733 Obstructive sleep apnea (adult) (pediatric): Secondary | ICD-10-CM | POA: Diagnosis not present

## 2023-05-03 DIAGNOSIS — Z955 Presence of coronary angioplasty implant and graft: Secondary | ICD-10-CM | POA: Diagnosis not present

## 2023-05-03 DIAGNOSIS — I5032 Chronic diastolic (congestive) heart failure: Secondary | ICD-10-CM | POA: Diagnosis not present

## 2023-05-03 DIAGNOSIS — Z95818 Presence of other cardiac implants and grafts: Secondary | ICD-10-CM | POA: Diagnosis not present

## 2023-05-03 DIAGNOSIS — I4729 Other ventricular tachycardia: Secondary | ICD-10-CM | POA: Diagnosis not present

## 2023-05-03 DIAGNOSIS — I480 Paroxysmal atrial fibrillation: Secondary | ICD-10-CM | POA: Diagnosis not present

## 2023-05-03 DIAGNOSIS — R42 Dizziness and giddiness: Secondary | ICD-10-CM | POA: Diagnosis not present

## 2023-05-03 DIAGNOSIS — Z8673 Personal history of transient ischemic attack (TIA), and cerebral infarction without residual deficits: Secondary | ICD-10-CM | POA: Diagnosis not present

## 2023-05-03 DIAGNOSIS — Z7901 Long term (current) use of anticoagulants: Secondary | ICD-10-CM | POA: Diagnosis not present

## 2023-05-03 DIAGNOSIS — I2510 Atherosclerotic heart disease of native coronary artery without angina pectoris: Secondary | ICD-10-CM | POA: Diagnosis not present

## 2023-05-13 NOTE — Therapy (Signed)
 OUTPATIENT PHYSICAL THERAPY NEURO EVALUATION   Patient Name: Taylor Austin MRN: [id number] DOB:12/16/1940, 83 y.o., male Today's Date: 05/14/2023   PCP: Newell Bard PROVIDER: Devora Folks  END OF SESSION:  PT End of Session - 05/14/23 1419     Visit Number 1    Number of Visits 24    Date for PT Re-Evaluation 08/06/23    PT Start Time 1145    PT Stop Time 1229    PT Time Calculation (min) 44 min    Activity Tolerance Patient tolerated treatment well    Behavior During Therapy [facility] for tasks assessed/performed             Past Medical History:  Diagnosis Date   Allergy    Anemia    Arthritis    Back pain    Barrett esophagus    BPH (benign prostatic hyperplasia)    Chronic kidney disease    COPD (chronic obstructive pulmonary disease) (HCC)    Coronary artery disease    Depression    Diabetes mellitus without complication (HCC)    Diverticulitis    Dysphagia    Esophageal reflux    Esophageal reflux    GERD (gastroesophageal reflux disease)    Heart murmur    Hyperlipidemia    Hypersomnia    Hypertension    Kidney stones    Low testosterone    OAB (overactive bladder)    Obesity    Presence of dental bridge    2 - top   Rheumatic fever    Sleep apnea    BiPAP   Stroke (HCC) 01/2016   Vertigo    Vitamin B12 deficiency    Past Surgical History:  Procedure Laterality Date   BACK SURGERY     CARDIAC CATHETERIZATION  08/11/2014   Procedure: Left Heart Cath and Coronary Angiography;  Surgeon: Ronney Cola, MD;  Location: [facility] INVASIVE CV [facility];  Service: Cardiovascular;;   CARDIAC CATHETERIZATION N/A 11/17/2014   Procedure: Right Heart Cath;  Surgeon: Ronney Cola, MD;  Location: [facility] INV[facility]IVE CV [facility];  Service: Cardiovascular;  Laterality: N/A;   CAROTID STENT     CATARACT EXTRACTION W/PHACO Left 04/03/2016   Procedure: CATARACT EXTRACTION PHACO AND INTRAOCULAR LENS PLACEMENT (IOC) left diabetic;  Surgeon: Rosa College, MD;   Location: [facility];  Service: Ophthalmology;  Laterality: Left;  diabetic - oral meds sleep apnea   CATARACT EXTRACTION W/PHACO Right 05/01/2016   Procedure: CATARACT EXTRACTION PHACO AND INTRAOCULAR LENS PLACEMENT (IOC) Right diabetic;  Surgeon: Rosa College, MD;  Location: [facility];  Service: Ophthalmology;  Laterality: Right;  Diabetic oral meds sleep apnea   CERVICAL SPINE SURGERY     COLONOSCOPY     COLONOSCOPY WITH PROPOFOL  N/A 06/10/2015   Procedure: COLONOSCOPY WITH PROPOFOL ;  Surgeon: Cassie Click, MD;  Location: [facility] ENDOSCOPY;  Service: Endoscopy;  Laterality: N/A;   COLONOSCOPY WITH PROPOFOL  N/A 02/28/2023   Procedure: COLONOSCOPY WITH PROPOFOL ;  Surgeon: Marnee Sink, MD;  Location: [facility] ENDOSCOPY;  Service: Endoscopy;  Laterality: N/A;   CORONARY ANGIOPLASTY     ESOPHAGOGASTRODUODENOSCOPY N/A 02/28/2023   Procedure: ESOPHAGOGASTRODUODENOSCOPY (EGD);  Surgeon: Marnee Sink, MD;  Location: [facility] ENDOSCOPY;  Service: Endoscopy;  Laterality: N/A;   ESOPHAGOGASTRODUODENOSCOPY (EGD) WITH PROPOFOL  N/A 06/28/2017   Procedure: ESOPHAGOGASTRODUODENOSCOPY (EGD) WITH PROPOFOL ;  Surgeon: Cassie Click, MD;  Location: [facility] ENDOSCOPY;  Service: Endoscopy;  Laterality: N/A;   ESOPHAGOGASTRODUODENOSCOPY ENDOSCOPY     EYE SURGERY  LUMBAR SPINE SURGERY     POLYPECTOMY  02/28/2023   Procedure: POLYPECTOMY;  Surgeon: Marnee Sink, MD;  Location: [facility] ENDOSCOPY;  Service: Endoscopy;;   THORACIC SPINE SURGERY     TONSILLECTOMY     Patient Active Problem List   Diagnosis Date Noted   Polyp of descending colon 02/28/2023   Duodenal ulceration 02/28/2023   Symptomatic anemia 02/26/2023   Upper GI bleed 02/26/2023   Morbid obesity (HCC) 02/26/2023   History of ischemic stroke (07/23/2021) 12/11/2021   Stroke-like symptoms 09/09/2021   Chronic diastolic CHF (congestive heart failure) (HCC) 07/26/2021   Ischemic stroke (HCC) 07/23/2021   Acute ischemic stroke (HCC)  07/23/2021   Cerebral infarction, unspecified (HCC) 07/23/2021   Fasting hyperglycemia 07/18/2021   Orthostatic hypotension 07/18/2021   Lumbar facet syndrome 12/20/2020   Vertigo, Chronic 12/07/2020   Retrolisthesis of L2 over L3 12/07/2020   At high risk for falls 12/07/2020   Aortic atherosclerosis (HCC) 09/20/2020   Hypomagnesemia 07/27/2020   Spondylosis without myelopathy or radiculopathy, lumbosacral region 07/27/2020   Mixed Alzheimer's and vascular dementia (HCC) 06/17/2020   Chronic pain syndrome 06/15/2020   Pharmacologic therapy 06/15/2020   Disorder of skeletal system 06/15/2020   Problems influencing health status 06/15/2020   Abnormal MRI, cervical spine (11/14/2019) 06/15/2020   Abnormal MRI, lumbar spine (11/14/2019) 06/15/2020   History of lumbar laminectomy 06/15/2020   History of fusion of cervical spine 06/15/2020   DDD (degenerative disc disease), cervical 06/15/2020   DDD (degenerative disc disease), lumbosacral 06/15/2020   Lumbosacral facet arthropathy (Multilevel) (Bilateral) 06/15/2020   Cervical facet hypertrophy 06/15/2020   DJD (degenerative joint disease) of cervical spine 06/15/2020   Chronic anticoagulation (Plavix ) 06/15/2020   Lumbar facet joint pain (Bilateral) 06/15/2020   History of CVA (cerebrovascular accident) 06/15/2020   Poor historian 06/15/2020   Chronic low back pain (1ry area of Pain) (Bilateral) (R>L) w/o sciatica 12/15/2019   Lumbar stenosis with neurogenic claudication 12/15/2019   Memory loss, short term 10/09/2019   Chronic obstructive pulmonary disease (HCC) 09/18/2018   Morbid obesity with BMI of 40.0-44.9, adult (HCC) 04/03/2018   Hyperlipidemia associated with type 2 diabetes mellitus (HCC) 02/05/2018   Prostate cancer (HCC) 09/08/2017   Gastroesophageal reflux disease without esophagitis 07/29/2017   Other dysphagia 07/29/2017   Acute cerebrovascular accident (CVA) (HCC) 01/30/2016   Chronic kidney disease 07/14/2014    Clinical depression 07/14/2014   Gait instability 05/05/2014   Arthritis of knee 07/03/2013   Arteriosclerosis of coronary artery 07/03/2013   Type 2 diabetes mellitus with diabetic neuropathy, without long-term current use of insulin  (HCC) 07/03/2013   Benign essential HTN 07/03/2013   HLD (hyperlipidemia) 07/03/2013   Breath shortness 07/03/2013   Atherosclerotic heart disease of native coronary artery without angina pectoris 07/03/2013   Sleep apnea 06/18/2013   History of Barrett's esophagus 01/08/2001    ONSET DATE: on and off for years  REFERRING DIAG: imbalance  THERAPY DIAG:  Unsteadiness on feet  Dizziness and giddiness  Muscle weakness (generalized)  Rationale for Evaluation and Treatment: Rehabilitation  SUBJECTIVE:  SUBJECTIVE STATEMENT: Patient presents for PT eval for imbalance.  Pt accompanied by: significant other  PERTINENT HISTORY: Patient presents with referral for imbalance. He has additional PMH of dementia, CVA July and September 2023( L occipital lobe and L thalamus) and 2018, A fib,chronic pain due to neuropathy, HTN, DM, HLD, OSA, anemia, Barrett's esophagus, depression, prostate cancer, obesity.  Patient did have a fall in February 2025 where he got out of a car and fell forward. Patient has vertigo all the time per patient and his legs are giving out on him.  Is getting injections into his knees.   PAIN:  Are you having pain?  Patient reports knees, low back pain when standing and walking.   PRECAUTIONS: Fall  RED FLAGS: None   WEIGHT BEARING RESTRICTIONS: No  FALLS: Has patient fallen in last 6 months? Yes. Number of falls 2  LIVING ENVIRONMENT: Lives with: lives with their spouse Lives in: House/apartment Stairs:  1 step onto the porch, and one onto the  house; has a chair lift for inside the house Has following equipment at home: Walker - 4 wheeled, shower chair, and Grab bars  PLOF: Independent with basic ADLs  PATIENT GOALS: to conquer the vertigo and strengthen his LE's.   OBJECTIVE:  Note: Objective measures were completed at Evaluation unless otherwise noted.  DIAGNOSTIC FINDINGS:  Lower Extremity EMG 09/16/2018 - This is an abnormal electrodiagnostic study consistent with a 1) generalized sensory polyneuropathy. 2) Superimposed bilateral S1 radiculopathies.   MRI C-Spine 11/13/2019: Prior ACDF at C5-C7 without residual spinal stenosis. Persistent uncovertebral spurring with residual mild to moderate bilateral C6 and C7 foraminal stenosis as above. Mild disc bulging with uncovertebral and facet hypertrophy at C3-4 and C4-5 with resultant mild spinal stenosis but no cord deformity. Moderate left and mild right C4 and C5 foraminal narrowing. Moderate multilevel facet arthrosis throughout the cervical spine as above, which could contribute to underlying neck pain.   MRI L-Spine 11/13/2019: Multilevel degenerative changes of the lumbar spine as described above, progressed at L2-L3 with there is now moderate spinal canal stenosis. Unchanged moderate to severe spinal canal stenosis and severe Left neuroforaminal stenosis at L3-L4. Unchanged moderate bilateral neuro foraminal stenosis at L5-S1.   COGNITION: Overall cognitive status: Impaired, History of cognitive impairments - at baseline, and late onset dementia   SENSATION: WFL  COORDINATION: Heel slide test: WFL   MUSCLE TONE: none present   POSTURE: rounded shoulders, forward head, and flexed trunk   LOWER EXTREMITY ROM:     Active  Right Eval Left Eval  Hip flexion    Hip extension    Hip abduction    Hip adduction    Hip internal rotation    Hip external rotation    Knee flexion    Knee extension    Ankle dorsiflexion    Ankle plantarflexion    Ankle inversion     Ankle eversion     (Blank rows = not tested)  LOWER EXTREMITY MMT:    MMT Right Eval Left Eval  Hip flexion 4 4  Hip extension    Hip abduction 4 4  Hip adduction 4 4  Hip internal rotation    Hip external rotation    Knee flexion 4- 4-  Knee extension 4- 4  Ankle dorsiflexion 4- 4-  Ankle plantarflexion 4- 4-  Ankle inversion    Ankle eversion    (Blank rows = not tested)  BED MOBILITY:  Not tested  TRANSFERS: Sit to stand: CGA  Assistive device utilized: Environmental consultant - 4 wheeled     Stand to sit: CGA  Assistive device utilized: Environmental consultant - 4 wheeled     Chair to chair: CGA and Min A  Assistive device utilized: Environmental consultant - 4 wheeled       RAMP:  Not tested  GAIT: Findings: Gait Characteristics: step through pattern, decreased stride length, shuffling, trunk flexed, poor foot clearance- Right, and poor foot clearance- Left, Distance walked: 60 ft, Assistive device utilized:Walker - 4 wheeled, Level of assistance: CGA, and Comments: increased knee flexion with fatigue  FUNCTIONAL TESTS:  5 times sit to stand: 13 seconds heavy  BUE support  10 meter walk test: 9 seconds with 4 [employer] Berg Balance Scale: 27   [facility] PT Assessment - 05/14/23 0001       Standardized Balance Assessment   Standardized Balance Assessment Berg Balance Test      Berg Balance Test   Sit to Stand Able to stand  independently using hands    Standing Unsupported Able to stand 30 seconds unsupported    Sitting with Back Unsupported but Feet Supported on Floor or Stool Able to sit 2 minutes under supervision    Stand to Sit Controls descent by using hands    Transfers Able to transfer safely, definite need of hands    Standing Unsupported with Eyes Closed Able to stand 3 seconds    Standing Unsupported with Feet Together Needs help to attain position but able to stand for 30 seconds with feet together    From Standing, Reach Forward with Outstretched Arm Reaches forward but needs supervision    From Standing  Position, Pick up Object from Floor Able to pick up shoe, needs supervision    From Standing Position, Turn to Look Behind Over each Shoulder Turn sideways only but maintains balance    Turn 360 Degrees Able to turn 360 degrees safely but slowly    Standing Unsupported, Alternately Place Feet on Step/Stool Able to complete >2 steps/needs minimal assist    Standing Unsupported, One Foot in Front Needs help to step but can hold 15 seconds    Standing on One Leg Unable to try or needs assist to prevent fall    Total Score 27             PATIENT SURVEYS:  Give next session                                                                                                                               TREATMENT DATE: 05/14/23   BP:  125/89 seated; standing: 136/67  PATIENT EDUCATION: Education details: goals, POC Person educated: Patient and Spouse Education method: Explanation, Demonstration, Tactile cues, and Verbal cues Education comprehension: verbalized understanding, returned demonstration, verbal cues required, tactile cues required, and needs further education  HOME EXERCISE PROGRAM: Give next session  GOALS: Goals reviewed with patient? Yes  SHORT TERM GOALS: Target date: 06/11/2023  Patient will be independent in home exercise program to improve strength/mobility for better functional independence with ADLs. Goal status: INITIAL    LONG TERM GOALS: Target date: 08/06/2023    Patient will reduce dizziness handicap inventory score to <50, for less dizziness with ADLs and increased safety with home and work tasks.  Baseline: give next session Goal status: INITIAL  2.  Patient will increase Berg Balance score by > 6 points to demonstrate decreased fall risk during functional activities. Baseline: 5/6: 27 Goal status: INITIAL  3.  Patient will increase BLE gross strength to 4+/5 as to improve functional strength for independent gait, increased standing tolerance and  increased ADL ability. Baseline: see above Goal status: INITIAL  4.  Patient will be able to perform five time sit to stand without UE support in <15 seconds to demonstrate improved LE strength and improved mobility at home and in the community.  Baseline: heavy BUE support 13 seconds Goal status: INITIAL    ASSESSMENT:  CLINICAL IMPRESSION: Patient is a 83 y.o. male who was seen today for physical therapy evaluation and treatment for imbalance.  Patient presents with wife. Due to history of vertigo and vestibular involvement patient will see vestibular therapist next for continuation of evaluation to involve vestibular system. Patient's balance is significantly impaired as can be seen in BERG score. He is able to ambulate quickly with 10 MWT with 4WW.  He requires heavy UE support for 5x STS test. Patient will benefit from skilled physical therapy to improve mobility and stability for improved quality of life.   OBJECTIVE IMPAIRMENTS: Abnormal gait, cardiopulmonary status limiting activity, decreased activity tolerance, decreased balance, decreased cognition, decreased endurance, decreased knowledge of condition, decreased mobility, difficulty walking, decreased strength, impaired perceived functional ability, improper body mechanics, postural dysfunction, obesity, and pain.   ACTIVITY LIMITATIONS: carrying, lifting, bending, standing, squatting, stairs, transfers, bed mobility, toileting, locomotion level, and caring for others  PARTICIPATION LIMITATIONS: meal prep, cleaning, laundry, driving, shopping, community activity, and yard work  PERSONAL FACTORS: Age, Fitness, Past/current experiences, Time since onset of injury/illness/exacerbation, Transportation, and 3+ comorbidities: dementia, CVA July and September 2023( L occipital lobe and L thalamus) and 2018, A fib,chronic pain due to neuropathy, HTN, DM, HLD, OSA, anemia, Barrett's esophagus, depression, prostate cancer, obesity  are also  affecting patient's functional outcome.   REHAB POTENTIAL: Good  CLINICAL DECISION MAKING: Evolving/moderate complexity  EVALUATION COMPLEXITY: Moderate  PLAN:  PT FREQUENCY: 2x/week  PT DURATION: 12 weeks  PLANNED INTERVENTIONS: 97164- PT Re-evaluation, 97750- Physical Performance Testing, 97110-Therapeutic exercises, 97530- Therapeutic activity, [id number]- Neuromuscular re-education, 97535- Self Care, 16109- Manual therapy, [id number]- Gait training, [phone]- Orthotic Initial, [phone]- Orthotic/Prosthetic subsequent, [phone]- Canalith repositioning, [id number]- Electrical stimulation (unattended), [phone]- Electrical stimulation (manual), [id number]- Vasopneumatic device, [id number]- Traction (mechanical), [id number]- Ionotophoresis 4mg /ml Dexamethasone , Patient/Family education, Balance training, Stair training, Taping, Dry Needling, Joint mobilization, Spinal mobilization, Vestibular training, Visual/preceptual remediation/compensation, DME instructions, Cryotherapy, Moist heat, and Biofeedback  PLAN FOR NEXT SESSION: vestibular evaluation, give HEP , DHI   Madgie Dhaliwal, PT 05/14/2023, 2:33 PM

## 2023-05-14 ENCOUNTER — Ambulatory Visit: Attending: Neurology

## 2023-05-14 DIAGNOSIS — R42 Dizziness and giddiness: Secondary | ICD-10-CM | POA: Insufficient documentation

## 2023-05-14 DIAGNOSIS — M6281 Muscle weakness (generalized): Secondary | ICD-10-CM | POA: Diagnosis not present

## 2023-05-14 DIAGNOSIS — R2681 Unsteadiness on feet: Secondary | ICD-10-CM | POA: Insufficient documentation

## 2023-05-16 ENCOUNTER — Ambulatory Visit

## 2023-05-16 DIAGNOSIS — Z7901 Long term (current) use of anticoagulants: Secondary | ICD-10-CM | POA: Diagnosis not present

## 2023-05-16 DIAGNOSIS — D509 Iron deficiency anemia, unspecified: Secondary | ICD-10-CM | POA: Diagnosis not present

## 2023-05-16 DIAGNOSIS — R6881 Early satiety: Secondary | ICD-10-CM | POA: Diagnosis not present

## 2023-05-16 DIAGNOSIS — Z8673 Personal history of transient ischemic attack (TIA), and cerebral infarction without residual deficits: Secondary | ICD-10-CM | POA: Diagnosis not present

## 2023-05-17 ENCOUNTER — Ambulatory Visit: Admitting: Physical Therapy

## 2023-05-20 ENCOUNTER — Other Ambulatory Visit: Payer: Self-pay | Admitting: Gastroenterology

## 2023-05-20 DIAGNOSIS — R6881 Early satiety: Secondary | ICD-10-CM

## 2023-05-21 ENCOUNTER — Ambulatory Visit: Admitting: Physical Therapy

## 2023-05-21 DIAGNOSIS — R42 Dizziness and giddiness: Secondary | ICD-10-CM | POA: Diagnosis not present

## 2023-05-21 DIAGNOSIS — R2681 Unsteadiness on feet: Secondary | ICD-10-CM | POA: Diagnosis not present

## 2023-05-21 DIAGNOSIS — G8929 Other chronic pain: Secondary | ICD-10-CM | POA: Diagnosis not present

## 2023-05-21 DIAGNOSIS — M6281 Muscle weakness (generalized): Secondary | ICD-10-CM | POA: Diagnosis not present

## 2023-05-21 DIAGNOSIS — M170 Bilateral primary osteoarthritis of knee: Secondary | ICD-10-CM | POA: Diagnosis not present

## 2023-05-21 NOTE — Therapy (Signed)
 OUTPATIENT PHYSICAL THERAPY NEURO & VESTIBULAR TREATMENT   Patient Name: Taylor Austin MRN: [id number] DOB:Jan 08, 1941, 83 y.o., male Today's Date: 05/21/2023   PCP: Newell Bard PROVIDER: Devora Folks  END OF SESSION:   PT End of Session - 05/21/23 0931     Visit Number 2    Number of Visits 24    Date for PT Re-Evaluation 08/06/23    PT Start Time 0931    PT Stop Time 1016    PT Time Calculation (min) 45 min    Equipment Utilized During Treatment --    Activity Tolerance Patient tolerated treatment well    Behavior During Therapy WFL for tasks assessed/performed              Past Medical History:  Diagnosis Date   Allergy    Anemia    Arthritis    Back pain    Barrett esophagus    BPH (benign prostatic hyperplasia)    Chronic kidney disease    COPD (chronic obstructive pulmonary disease) (HCC)    Coronary artery disease    Depression    Diabetes mellitus without complication (HCC)    Diverticulitis    Dysphagia    Esophageal reflux    Esophageal reflux    GERD (gastroesophageal reflux disease)    Heart murmur    Hyperlipidemia    Hypersomnia    Hypertension    Kidney stones    Low testosterone    OAB (overactive bladder)    Obesity    Presence of dental bridge    2 - top   Rheumatic fever    Sleep apnea    BiPAP   Stroke (HCC) 01/2016   Vertigo    Vitamin B12 deficiency    Past Surgical History:  Procedure Laterality Date   BACK SURGERY     CARDIAC CATHETERIZATION  08/11/2014   Procedure: Left Heart Cath and Coronary Angiography;  Surgeon: Ronney Cola, MD;  Location: [facility];  Service: Cardiovascular;;   CARDIAC CATHETERIZATION N/A 11/17/2014   Procedure: Right Heart Cath;  Surgeon: Ronney Cola, MD;  Location: [facility];  Service: Cardiovascular;  Laterality: N/A;   CAROTID STENT     CATARACT EXTRACTION W/PHACO Left 04/03/2016   Procedure: CATARACT EXTRACTION PHACO AND INTRAOCULAR LENS PLACEMENT  (IOC) left diabetic;  Surgeon: Rosa College, MD;  Location: [facility];  Service: Ophthalmology;  Laterality: Left;  diabetic - oral meds sleep apnea   CATARACT EXTRACTION W/PHACO Right 05/01/2016   Procedure: CATARACT EXTRACTION PHACO AND INTRAOCULAR LENS PLACEMENT (IOC) Right diabetic;  Surgeon: Rosa College, MD;  Location: [facility];  Service: Ophthalmology;  Laterality: Right;  Diabetic oral meds sleep apnea   CERVICAL SPINE SURGERY     COLONOSCOPY     COLONOSCOPY WITH PROPOFOL  N/A 06/10/2015   Procedure: COLONOSCOPY WITH PROPOFOL ;  Surgeon: Cassie Click, MD;  Location: [facility] ENDOSCOPY;  Service: Endoscopy;  Laterality: N/A;   COLONOSCOPY WITH PROPOFOL  N/A 02/28/2023   Procedure: COLONOSCOPY WITH PROPOFOL ;  Surgeon: Marnee Sink, MD;  Location: [facility] ENDOSCOPY;  Service: Endoscopy;  Laterality: N/A;   CORONARY ANGIOPLASTY     ESOPHAGOGASTRODUODENOSCOPY N/A 02/28/2023   Procedure: ESOPHAGOGASTRODUODENOSCOPY (EGD);  Surgeon: Marnee Sink, MD;  Location: [facility] ENDOSCOPY;  Service: Endoscopy;  Laterality: N/A;   ESOPHAGOGASTRODUODENOSCOPY (EGD) WITH PROPOFOL  N/A 06/28/2017   Procedure: ESOPHAGOGASTRODUODENOSCOPY (EGD) WITH PROPOFOL ;  Surgeon: Cassie Click, MD;  Location: [facility] ENDOSCOPY;  Service: Endoscopy;  Laterality: N/A;  ESOPHAGOGASTRODUODENOSCOPY ENDOSCOPY     EYE SURGERY     LUMBAR SPINE SURGERY     POLYPECTOMY  02/28/2023   Procedure: POLYPECTOMY;  Surgeon: Marnee Sink, MD;  Location: [facility] ENDOSCOPY;  Service: Endoscopy;;   THORACIC SPINE SURGERY     TONSILLECTOMY     Patient Active Problem List   Diagnosis Date Noted   Polyp of descending colon 02/28/2023   Duodenal ulceration 02/28/2023   Symptomatic anemia 02/26/2023   Upper GI bleed 02/26/2023   Morbid obesity (HCC) 02/26/2023   History of ischemic stroke (07/23/2021) 12/11/2021   Stroke-like symptoms 09/09/2021   Chronic diastolic CHF (congestive heart failure) (HCC) 07/26/2021   Ischemic  stroke (HCC) 07/23/2021   Acute ischemic stroke (HCC) 07/23/2021   Cerebral infarction, unspecified (HCC) 07/23/2021   Fasting hyperglycemia 07/18/2021   Orthostatic hypotension 07/18/2021   Lumbar facet syndrome 12/20/2020   Vertigo, Chronic 12/07/2020   Retrolisthesis of L2 over L3 12/07/2020   At high risk for falls 12/07/2020   Aortic atherosclerosis (HCC) 09/20/2020   Hypomagnesemia 07/27/2020   Spondylosis without myelopathy or radiculopathy, lumbosacral region 07/27/2020   Mixed Alzheimer's and vascular dementia (HCC) 06/17/2020   Chronic pain syndrome 06/15/2020   Pharmacologic therapy 06/15/2020   Disorder of skeletal system 06/15/2020   Problems influencing health status 06/15/2020   Abnormal MRI, cervical spine (11/14/2019) 06/15/2020   Abnormal MRI, lumbar spine (11/14/2019) 06/15/2020   History of lumbar laminectomy 06/15/2020   History of fusion of cervical spine 06/15/2020   DDD (degenerative disc disease), cervical 06/15/2020   DDD (degenerative disc disease), lumbosacral 06/15/2020   Lumbosacral facet arthropathy (Multilevel) (Bilateral) 06/15/2020   Cervical facet hypertrophy 06/15/2020   DJD (degenerative joint disease) of cervical spine 06/15/2020   Chronic anticoagulation (Plavix ) 06/15/2020   Lumbar facet joint pain (Bilateral) 06/15/2020   History of CVA (cerebrovascular accident) 06/15/2020   Poor historian 06/15/2020   Chronic low back pain (1ry area of Pain) (Bilateral) (R>L) w/o sciatica 12/15/2019   Lumbar stenosis with neurogenic claudication 12/15/2019   Memory loss, short term 10/09/2019   Chronic obstructive pulmonary disease (HCC) 09/18/2018   Morbid obesity with BMI of 40.0-44.9, adult (HCC) 04/03/2018   Hyperlipidemia associated with type 2 diabetes mellitus (HCC) 02/05/2018   Prostate cancer (HCC) 09/08/2017   Gastroesophageal reflux disease without esophagitis 07/29/2017   Other dysphagia 07/29/2017   Acute cerebrovascular accident (CVA)  (HCC) 01/30/2016   Chronic kidney disease 07/14/2014   Clinical depression 07/14/2014   Gait instability 05/05/2014   Arthritis of knee 07/03/2013   Arteriosclerosis of coronary artery 07/03/2013   Type 2 diabetes mellitus with diabetic neuropathy, without long-term current use of insulin  (HCC) 07/03/2013   Benign essential HTN 07/03/2013   HLD (hyperlipidemia) 07/03/2013   Breath shortness 07/03/2013   Atherosclerotic heart disease of native coronary artery without angina pectoris 07/03/2013   Sleep apnea 06/18/2013   History of Barrett's esophagus 01/08/2001    ONSET DATE: on and off for years  REFERRING DIAG: imbalance  THERAPY DIAG:  Unsteadiness on feet  Dizziness and giddiness  Muscle weakness (generalized)  Rationale for Evaluation and Treatment: Rehabilitation  SUBJECTIVE:  SUBJECTIVE STATEMENT:  Pt states he has had vertigo for a very long time. Pt states he basically "has it all the time." Stating he has to make sure he doesn't move too quickly to avoid making it worse/causing it.   States having neck pain and stiffness this morning, but can't recall any event to cause this pain. Rated as 5/10 when moving his neck.  Reports he has had a couple of falls since last visit, but reports having trouble with his memory to recall the specifics of the falls.  Pt accompanied by: significant other (wife, Taylor Austin) in waiting room   From Initial Eval: Patient presents for PT eval for imbalance.   PERTINENT HISTORY: Patient presents with referral for imbalance. He has additional PMH of dementia, CVA July and September 2023( L occipital lobe and L thalamus) and 2018, A fib,chronic pain due to neuropathy, HTN, DM, HLD, OSA, anemia, Barrett's esophagus, depression, prostate cancer, obesity.   Patient did have a fall in February 2025 where he got out of a car and fell forward. Patient has vertigo all the time per patient and his legs are giving out on him.  Is getting injections into his knees.   PAIN:  Are you having pain? Patient reports knees, low back pain when standing and walking.   PRECAUTIONS: Fall  RED FLAGS: None   WEIGHT BEARING RESTRICTIONS: No  FALLS: Has patient fallen in last 6 months? Yes. Number of falls 2  LIVING ENVIRONMENT: Lives with: lives with their spouse Lives in: House/apartment Stairs: 1 step onto the porch, and one onto the house; has a chair lift for inside the house Has following equipment at home: Walker - 4 wheeled, shower chair, and Grab bars  PLOF: Independent with basic ADLs  PATIENT GOALS: to conquer the vertigo and strengthen his LE's.   OBJECTIVE:  Note: Objective measures were completed at Evaluation unless otherwise noted.  DIAGNOSTIC FINDINGS:  Lower Extremity EMG 09/16/2018 - This is an abnormal electrodiagnostic study consistent with a 1) generalized sensory polyneuropathy. 2) Superimposed bilateral S1 radiculopathies.   MRI C-Spine 11/13/2019: Prior ACDF at C5-C7 without residual spinal stenosis. Persistent uncovertebral spurring with residual mild to moderate bilateral C6 and C7 foraminal stenosis as above. Mild disc bulging with uncovertebral and facet hypertrophy at C3-4 and C4-5 with resultant mild spinal stenosis but no cord deformity. Moderate left and mild right C4 and C5 foraminal narrowing. Moderate multilevel facet arthrosis throughout the cervical spine as above, which could contribute to underlying neck pain.   MRI L-Spine 11/13/2019: Multilevel degenerative changes of the lumbar spine as described above, progressed at L2-L3 with there is now moderate spinal canal stenosis. Unchanged moderate to severe spinal canal stenosis and severe Left neuroforaminal stenosis at L3-L4. Unchanged moderate bilateral neuro foraminal  stenosis at L5-S1.   COGNITION: Overall cognitive status: Impaired, History of cognitive impairments - at baseline, and late onset dementia   SENSATION: WFL  COORDINATION: Heel slide test: WFL   MUSCLE TONE: none present   POSTURE: rounded shoulders, forward head, and flexed trunk   LOWER EXTREMITY ROM:     Active  Right Eval Left Eval  Hip flexion    Hip extension    Hip abduction    Hip adduction    Hip internal rotation    Hip external rotation    Knee flexion    Knee extension    Ankle dorsiflexion    Ankle plantarflexion    Ankle inversion    Ankle eversion     (  Blank rows = not tested)  LOWER EXTREMITY MMT:    MMT Right Eval Left Eval  Hip flexion 4 4  Hip extension    Hip abduction 4 4  Hip adduction 4 4  Hip internal rotation    Hip external rotation    Knee flexion 4- 4-  Knee extension 4- 4  Ankle dorsiflexion 4- 4-  Ankle plantarflexion 4- 4-  Ankle inversion    Ankle eversion    (Blank rows = not tested)  BED MOBILITY:  Not tested  TRANSFERS: Sit to stand: CGA  Assistive device utilized: Environmental consultant - 4 wheeled     Stand to sit: CGA  Assistive device utilized: Environmental consultant - 4 wheeled     Chair to chair: CGA and Min A  Assistive device utilized: Environmental consultant - 4 wheeled       RAMP:  Not tested  GAIT: Findings: Gait Characteristics: step through pattern, decreased stride length, shuffling, trunk flexed, poor foot clearance- Right, and poor foot clearance- Left, Distance walked: 60 ft, Assistive device utilized:Walker - 4 wheeled, Level of assistance: CGA, and Comments: increased knee flexion with fatigue  FUNCTIONAL TESTS:  5 times sit to stand: 13 seconds heavy  BUE support  10 meter walk test: 9 seconds with 4 [employer] Berg Balance Scale: 27     PATIENT SURVEYS:  Give next session                                                                                                                               TREATMENT DATE: 05/21/23  Gait into  therapy clinic using rollator with supervision. Pt becomes very SOB with labored breathing after walking the short distance into clinic requiring seated rest break.  Patient's DHI score indicates 42/moderate handicap with pt responding "yes" to the following:  Ambitious activities increase dizziness Afraid to leave home without someone Quick head movements increase dizziness Difficulty performing strenuous household work Difficult to walk by himself Walking down sidewalk increases dizziness Feels depressed and handicapped because of this Bending over increases dizziness 16-34 Points (mild handicap)  36-52 Points (moderate handicap)  54+ Points (severe handicap)  Pt states brief moments of vertigo happen to him "sometimes" when turning his head too quickly.   Pt states he found a tick bite many years ago and around that time the vertigo started.  Denies having prior treatment for the dizziness. Reports previously going to ENT, but unable to recall why he went.  Pt unable to recall if he is taking meclizine, but doesn't believe he is and it is not listed in his medications in his chart.  Pt states he has a sleep number and usually sleeps with his HOB elevation.    VESTIBULAR ASSESSMENT:  OCULOMOTOR EXAM:  Ocular Alignment: abnormal and L eye slightly adducted compared to R  Ocular ROM: No Limitations  Spontaneous Nystagmus: absent  Gaze-Induced Nystagmus: absent  Smooth Pursuits: intact and but did notice some impaired  coordination in R eye when looking up  Saccades: intact and but a few times of undershooting when moving towards R target  Convergence/Divergence: eyes converge symmetrically  VESTIBULAR - OCULAR REFLEX:   Slow VOR: Normal  VOR Cancellation: Normal  Head-Impulse Test: HIT Right:   HIT Left:   Unable to test due to pt guarding too much during VOR   Dynamic Visual Acuity: Not able to be assessed   POSITIONAL TESTING: Right Dix-Hallpike: need to test Left  Dix-Hallpike: upbeating, left nystagmus and symptomatic Left Sidelying: upbeating, left nystagmus and symptomatic with pt becoming nauseous - therapist provides cool wash cloth for symptom management  Performed CRM x1 for L posterior canal BPPV  Gait training afterward using rollator to ensure pt safety, with him reporting feeling better than he was initially after repositioning maneuver.  Provided ANPT educational handouts on BPPV and What to do after BPPV repositioning.     PATIENT EDUCATION: Education details: goals, POC Person educated: Patient and Spouse Education method: Explanation, Demonstration, Tactile cues, and Verbal cues Education comprehension: verbalized understanding, returned demonstration, verbal cues required, tactile cues required, and needs further education  HOME EXERCISE PROGRAM:  Give in upcoming sessions   GOALS: Goals reviewed with patient? Yes  SHORT TERM GOALS: Target date: 06/11/2023    Patient will be independent in home exercise program to improve strength/mobility for better functional independence with ADLs. Goal status: INITIAL    LONG TERM GOALS: Target date: 08/06/2023    Patient will reduce dizziness handicap inventory score to <50, for less dizziness with ADLs and increased safety with home and work tasks.  Baseline: 05/21/2023:  42/moderate handicap Goal status: INITIAL  2.  Patient will increase Berg Balance score by > 6 points to demonstrate decreased fall risk during functional activities. Baseline: 5/6: 27 Goal status: INITIAL  3.  Patient will increase BLE gross strength to 4+/5 as to improve functional strength for independent gait, increased standing tolerance and increased ADL ability. Baseline: see above Goal status: INITIAL  4.  Patient will be able to perform five time sit to stand without UE support in <15 seconds to demonstrate improved LE strength and improved mobility at home and in the community.  Baseline: heavy BUE  support 13 seconds Goal status: INITIAL    ASSESSMENT:  CLINICAL IMPRESSION:  Patient is an 83 y.o. male who was seen today for physical therapy treatment for imbalance. Patient reports a moderate dizziness handicap based on [facility] impacting pt's ability to participate in functional mobility tasks without exacerbation of dizziness placing pt at increased fall risk. Patient positive for L posterior canal BPPV with nystagmus and positive for symptoms with nausea; however, pt able to tolerate 1x canalith repositioning maneuver today. Pt's symptoms improve following rest break and assessed to be safe to ambulate out of the clinic at this time with his wife's supervision. Mr. Vivirito will benefit from continued skilled physical therapy to improve mobility, decrease dizziness, and improve balance for improved quality of life, increased independence, and decreased fall risk.   OBJECTIVE IMPAIRMENTS: Abnormal gait, cardiopulmonary status limiting activity, decreased activity tolerance, decreased balance, decreased cognition, decreased endurance, decreased knowledge of condition, decreased mobility, difficulty walking, decreased strength, impaired perceived functional ability, improper body mechanics, postural dysfunction, obesity, and pain.   ACTIVITY LIMITATIONS: carrying, lifting, bending, standing, squatting, stairs, transfers, bed mobility, toileting, locomotion level, and caring for others  PARTICIPATION LIMITATIONS: meal prep, cleaning, laundry, driving, shopping, community activity, and yard work  PERSONAL FACTORS: Age, Fitness, Past/current experiences,  Time since onset of injury/illness/exacerbation, Transportation, and 3+ comorbidities: dementia, CVA July and September 2023( L occipital lobe and L thalamus) and 2018, A fib,chronic pain due to neuropathy, HTN, DM, HLD, OSA, anemia, Barrett's esophagus, depression, prostate cancer, obesity are also affecting patient's functional outcome.   REHAB  POTENTIAL: Good  CLINICAL DECISION MAKING: Evolving/moderate complexity  EVALUATION COMPLEXITY: Moderate  PLAN:  PT FREQUENCY: 2x/week  PT DURATION: 12 weeks  PLANNED INTERVENTIONS: 97164- PT Re-evaluation, 97750- Physical Performance Testing, 97110-Therapeutic exercises, 97530- Therapeutic activity, [id number]- Neuromuscular re-education, 97535- Self Care, 10272- Manual therapy, [id number]- Gait training, [id number]- Orthotic Initial, [phone]- Orthotic/Prosthetic subsequent, [phone]- Canalith repositioning, [id number]- Electrical stimulation (unattended), [phone]- Electrical stimulation (manual), [id number]- Vasopneumatic device, [id number]- Traction (mechanical), [id number]- Ionotophoresis 4mg /ml Dexamethasone , Patient/Family education, Balance training, Stair training, Taping, Dry Needling, Joint mobilization, Spinal mobilization, Vestibular training, Visual/preceptual remediation/compensation, DME instructions, Cryotherapy, Moist heat, and Biofeedback  PLAN FOR NEXT SESSION:  - re-test L Dix-Hallpike  - test R Dix-Hallpike and bilateral roll tests if indicated - Give HEP - standing balance  - narrow BOS  - reaching  - foot taps   - eyes closed - dynamic gait training with use of rollator for safety as needed      Kishan [place], PT, DPT, NCS, CSRS Physical Therapist - [place]  [facility]  1:34 PM 05/21/23

## 2023-05-23 ENCOUNTER — Ambulatory Visit

## 2023-05-24 ENCOUNTER — Encounter
Admission: RE | Admit: 2023-05-24 | Discharge: 2023-05-24 | Disposition: A | Discharge status: Home / Self Care | Source: Ambulatory Visit | Attending: Gastroenterology | Admitting: Gastroenterology

## 2023-05-24 DIAGNOSIS — R6881 Early satiety: Secondary | ICD-10-CM | POA: Insufficient documentation

## 2023-05-24 MED ORDER — TECHNETIUM TC 99M SULFUR COLLOID
2.1800 | Freq: Once | INTRAVENOUS | Status: AC | PRN
  Administered 2023-05-24: 2.18 via ORAL

## 2023-05-27 ENCOUNTER — Ambulatory Visit

## 2023-05-27 DIAGNOSIS — R42 Dizziness and giddiness: Secondary | ICD-10-CM

## 2023-05-27 DIAGNOSIS — M6281 Muscle weakness (generalized): Secondary | ICD-10-CM

## 2023-05-27 DIAGNOSIS — R2681 Unsteadiness on feet: Secondary | ICD-10-CM | POA: Diagnosis not present

## 2023-05-27 NOTE — Therapy (Signed)
 OUTPATIENT PHYSICAL THERAPY NEURO & VESTIBULAR TREATMENT   Patient Name: Taylor Austin MRN: [id number] DOB:04/19/1940, 83 y.o., male Today's Date: 05/27/2023   PCP: Newell Bard PROVIDER: Devora Folks  END OF SESSION:   PT End of Session - 05/27/23 0937     Visit Number 3    Number of Visits 24    Date for PT Re-Evaluation 08/06/23    PT Start Time 0937    PT Stop Time 1015    PT Time Calculation (min) 38 min    Activity Tolerance Patient tolerated treatment well    Behavior During Therapy [facility] for tasks assessed/performed              Past Medical History:  Diagnosis Date   Allergy    Anemia    Arthritis    Back pain    Barrett esophagus    BPH (benign prostatic hyperplasia)    Chronic kidney disease    COPD (chronic obstructive pulmonary disease) (HCC)    Coronary artery disease    Depression    Diabetes mellitus without complication (HCC)    Diverticulitis    Dysphagia    Esophageal reflux    Esophageal reflux    GERD (gastroesophageal reflux disease)    Heart murmur    Hyperlipidemia    Hypersomnia    Hypertension    Kidney stones    Low testosterone    OAB (overactive bladder)    Obesity    Presence of dental bridge    2 - top   Rheumatic fever    Sleep apnea    BiPAP   Stroke (HCC) 01/2016   Vertigo    Vitamin B12 deficiency    Past Surgical History:  Procedure Laterality Date   BACK SURGERY     CARDIAC CATHETERIZATION  08/11/2014   Procedure: Left Heart Cath and Coronary Angiography;  Surgeon: Ronney Cola, MD;  Location: [facility] INVASIVE CV [facility];  Service: Cardiovascular;;   CARDIAC CATHETERIZATION N/A 11/17/2014   Procedure: Right Heart Cath;  Surgeon: Ronney Cola, MD;  Location: [facility] INV[facility] CV [facility];  Service: Cardiovascular;  Laterality: N/A;   CAROTID STENT     CATARACT EXTRACTION W/PHACO Left 04/03/2016   Procedure: CATARACT EXTRACTION PHACO AND INTRAOCULAR LENS PLACEMENT (IOC) left diabetic;  Surgeon: Rosa College, MD;  Location: [facility];  Service: Ophthalmology;  Laterality: Left;  diabetic - oral meds sleep apnea   CATARACT EXTRACTION W/PHACO Right 05/01/2016   Procedure: CATARACT EXTRACTION PHACO AND INTRAOCULAR LENS PLACEMENT (IOC) Right diabetic;  Surgeon: Rosa College, MD;  Location: [facility];  Service: Ophthalmology;  Laterality: Right;  Diabetic oral meds sleep apnea   CERVICAL SPINE SURGERY     COLONOSCOPY     COLONOSCOPY WITH PROPOFOL  N/A 06/10/2015   Procedure: COLONOSCOPY WITH PROPOFOL ;  Surgeon: Cassie Click, MD;  Location: [facility] ENDOSCOPY;  Service: Endoscopy;  Laterality: N/A;   COLONOSCOPY WITH PROPOFOL  N/A 02/28/2023   Procedure: COLONOSCOPY WITH PROPOFOL ;  Surgeon: Marnee Sink, MD;  Location: [facility] ENDOSCOPY;  Service: Endoscopy;  Laterality: N/A;   CORONARY ANGIOPLASTY     ESOPHAGOGASTRODUODENOSCOPY N/A 02/28/2023   Procedure: ESOPHAGOGASTRODUODENOSCOPY (EGD);  Surgeon: Marnee Sink, MD;  Location: [facility] ENDOSCOPY;  Service: Endoscopy;  Laterality: N/A;   ESOPHAGOGASTRODUODENOSCOPY (EGD) WITH PROPOFOL  N/A 06/28/2017   Procedure: ESOPHAGOGASTRODUODENOSCOPY (EGD) WITH PROPOFOL ;  Surgeon: Cassie Click, MD;  Location: [facility] ENDOSCOPY;  Service: Endoscopy;  Laterality: N/A;   ESOPHAGOGASTRODUODENOSCOPY ENDOSCOPY  EYE SURGERY     LUMBAR SPINE SURGERY     POLYPECTOMY  02/28/2023   Procedure: POLYPECTOMY;  Surgeon: Marnee Sink, MD;  Location: [facility] ENDOSCOPY;  Service: Endoscopy;;   THORACIC SPINE SURGERY     TONSILLECTOMY     Patient Active Problem List   Diagnosis Date Noted   Polyp of descending colon 02/28/2023   Duodenal ulceration 02/28/2023   Symptomatic anemia 02/26/2023   Upper GI bleed 02/26/2023   Morbid obesity (HCC) 02/26/2023   History of ischemic stroke (07/23/2021) 12/11/2021   Stroke-like symptoms 09/09/2021   Chronic diastolic CHF (congestive heart failure) (HCC) 07/26/2021   Ischemic stroke (HCC) 07/23/2021   Acute ischemic  stroke (HCC) 07/23/2021   Cerebral infarction, unspecified (HCC) 07/23/2021   Fasting hyperglycemia 07/18/2021   Orthostatic hypotension 07/18/2021   Lumbar facet syndrome 12/20/2020   Vertigo, Chronic 12/07/2020   Retrolisthesis of L2 over L3 12/07/2020   At high risk for falls 12/07/2020   Aortic atherosclerosis (HCC) 09/20/2020   Hypomagnesemia 07/27/2020   Spondylosis without myelopathy or radiculopathy, lumbosacral region 07/27/2020   Mixed Alzheimer's and vascular dementia (HCC) 06/17/2020   Chronic pain syndrome 06/15/2020   Pharmacologic therapy 06/15/2020   Disorder of skeletal system 06/15/2020   Problems influencing health status 06/15/2020   Abnormal MRI, cervical spine (11/14/2019) 06/15/2020   Abnormal MRI, lumbar spine (11/14/2019) 06/15/2020   History of lumbar laminectomy 06/15/2020   History of fusion of cervical spine 06/15/2020   DDD (degenerative disc disease), cervical 06/15/2020   DDD (degenerative disc disease), lumbosacral 06/15/2020   Lumbosacral facet arthropathy (Multilevel) (Bilateral) 06/15/2020   Cervical facet hypertrophy 06/15/2020   DJD (degenerative joint disease) of cervical spine 06/15/2020   Chronic anticoagulation (Plavix ) 06/15/2020   Lumbar facet joint pain (Bilateral) 06/15/2020   History of CVA (cerebrovascular accident) 06/15/2020   Poor historian 06/15/2020   Chronic low back pain (1ry area of Pain) (Bilateral) (R>L) w/o sciatica 12/15/2019   Lumbar stenosis with neurogenic claudication 12/15/2019   Memory loss, short term 10/09/2019   Chronic obstructive pulmonary disease (HCC) 09/18/2018   Morbid obesity with BMI of 40.0-44.9, adult (HCC) 04/03/2018   Hyperlipidemia associated with type 2 diabetes mellitus (HCC) 02/05/2018   Prostate cancer (HCC) 09/08/2017   Gastroesophageal reflux disease without esophagitis 07/29/2017   Other dysphagia 07/29/2017   Acute cerebrovascular accident (CVA) (HCC) 01/30/2016   Chronic kidney disease  07/14/2014   Clinical depression 07/14/2014   Gait instability 05/05/2014   Arthritis of knee 07/03/2013   Arteriosclerosis of coronary artery 07/03/2013   Type 2 diabetes mellitus with diabetic neuropathy, without long-term current use of insulin  (HCC) 07/03/2013   Benign essential HTN 07/03/2013   HLD (hyperlipidemia) 07/03/2013   Breath shortness 07/03/2013   Atherosclerotic heart disease of native coronary artery without angina pectoris 07/03/2013   Sleep apnea 06/18/2013   History of Barrett's esophagus 01/08/2001    ONSET DATE: on and off for years  REFERRING DIAG: imbalance  THERAPY DIAG:  Unsteadiness on feet  Dizziness and giddiness  Muscle weakness (generalized)  Rationale for Evaluation and Treatment: Rehabilitation  SUBJECTIVE:  SUBJECTIVE STATEMENT:  Pt reports he felt his dizziness may have gotten a little better, but he's been having some complications with his stomach.  Pt reports nausea after eating and it doesn't stop for 8 hours.     Pt accompanied by: significant other (wife, Sharman Debar) in waiting room   From Initial Eval: Patient presents for PT eval for imbalance.   PERTINENT HISTORY: Patient presents with referral for imbalance. He has additional PMH of dementia, CVA July and September 2023( L occipital lobe and L thalamus) and 2018, A fib,chronic pain due to neuropathy, HTN, DM, HLD, OSA, anemia, Barrett's esophagus, depression, prostate cancer, obesity.  Patient did have a fall in February 2025 where he got out of a car and fell forward. Patient has vertigo all the time per patient and his legs are giving out on him.  Is getting injections into his knees.   PAIN:  Are you having pain? Patient reports knees, low back pain when standing and walking.   PRECAUTIONS:  Fall  RED FLAGS: None   WEIGHT BEARING RESTRICTIONS: No  FALLS: Has patient fallen in last 6 months? Yes. Number of falls 2  LIVING ENVIRONMENT: Lives with: lives with their spouse Lives in: House/apartment Stairs: 1 step onto the porch, and one onto the house; has a chair lift for inside the house Has following equipment at home: Walker - 4 wheeled, shower chair, and Grab bars  PLOF: Independent with basic ADLs  PATIENT GOALS: to conquer the vertigo and strengthen his LE's.   OBJECTIVE:  Note: Objective measures were completed at Evaluation unless otherwise noted.  DIAGNOSTIC FINDINGS:  Lower Extremity EMG 09/16/2018 - This is an abnormal electrodiagnostic study consistent with a 1) generalized sensory polyneuropathy. 2) Superimposed bilateral S1 radiculopathies.   MRI C-Spine 11/13/2019: Prior ACDF at C5-C7 without residual spinal stenosis. Persistent uncovertebral spurring with residual mild to moderate bilateral C6 and C7 foraminal stenosis as above. Mild disc bulging with uncovertebral and facet hypertrophy at C3-4 and C4-5 with resultant mild spinal stenosis but no cord deformity. Moderate left and mild right C4 and C5 foraminal narrowing. Moderate multilevel facet arthrosis throughout the cervical spine as above, which could contribute to underlying neck pain.   MRI L-Spine 11/13/2019: Multilevel degenerative changes of the lumbar spine as described above, progressed at L2-L3 with there is now moderate spinal canal stenosis. Unchanged moderate to severe spinal canal stenosis and severe Left neuroforaminal stenosis at L3-L4. Unchanged moderate bilateral neuro foraminal stenosis at L5-S1.   COGNITION: Overall cognitive status: Impaired, History of cognitive impairments - at baseline, and late onset dementia   SENSATION: WFL  COORDINATION: Heel slide test: WFL   MUSCLE TONE: none present   POSTURE: rounded shoulders, forward head, and flexed trunk   LOWER EXTREMITY ROM:      Active  Right Eval Left Eval  Hip flexion    Hip extension    Hip abduction    Hip adduction    Hip internal rotation    Hip external rotation    Knee flexion    Knee extension    Ankle dorsiflexion    Ankle plantarflexion    Ankle inversion    Ankle eversion     (Blank rows = not tested)  LOWER EXTREMITY MMT:    MMT Right Eval Left Eval  Hip flexion 4 4  Hip extension    Hip abduction 4 4  Hip adduction 4 4  Hip internal rotation    Hip external rotation  Knee flexion 4- 4-  Knee extension 4- 4  Ankle dorsiflexion 4- 4-  Ankle plantarflexion 4- 4-  Ankle inversion    Ankle eversion    (Blank rows = not tested)  BED MOBILITY:  Not tested  TRANSFERS: Sit to stand: CGA  Assistive device utilized: Environmental consultant - 4 wheeled     Stand to sit: CGA  Assistive device utilized: Environmental consultant - 4 wheeled     Chair to chair: CGA and Min A  Assistive device utilized: Environmental consultant - 4 wheeled       RAMP:  Not tested  GAIT: Findings: Gait Characteristics: step through pattern, decreased stride length, shuffling, trunk flexed, poor foot clearance- Right, and poor foot clearance- Left, Distance walked: 60 ft, Assistive device utilized:Walker - 4 wheeled, Level of assistance: CGA, and Comments: increased knee flexion with fatigue  FUNCTIONAL TESTS:  5 times sit to stand: 13 seconds heavy  BUE support  10 meter walk test: 9 seconds with 4 [employer] Berg Balance Scale: 27     PATIENT SURVEYS:  Give next session                                                                                                                               TREATMENT DATE: 05/27/23  Neuro:  Performed by Carlen Chasten, PT, DPT:   Dix-Hallpike re-test and found to be negative bilaterally, along with Bilateral roll test, which were also negative.  Rest of session continued with treating therapist  Static stance, eyes closed, 30 sec bout Static NBOS, 30 sec bouts x2 Static NBOS, eyes closed, 30 sec bouts,  x2 Static staggered stance, 30 sec bouts, x2  Pt educated on pursed lip breathing after bouts of upright exercises due to being SOB at times.   Pt also noted to have some dizziness so vitals were assessed:  BP: 152/72 mmHg HR: 45 SpO2: 100   Pt participated in ambulation attempts around the gym for improved gait pattern with the RW.   Ambulation in hallway with head turns/head nods/change in speeds for vestibular and neurological advancement of gait pattern.  Pt does drift with head turns, however responds well to all other activities.   VESTIBULAR ASSESSMENT:  OCULOMOTOR EXAM:  Ocular Alignment: abnormal and L eye slightly adducted compared to R  Ocular ROM: No Limitations  Spontaneous Nystagmus: absent  Gaze-Induced Nystagmus: absent  Smooth Pursuits: intact and but did notice some impaired coordination in R eye when looking up  Saccades: intact and but a few times of undershooting when moving towards R target  Convergence/Divergence: eyes converge symmetrically  VESTIBULAR - OCULAR REFLEX:   Slow VOR: Normal  VOR Cancellation: Normal  Head-Impulse Test: HIT Right:   HIT Left:   Unable to test due to pt guarding too much during VOR   Dynamic Visual Acuity: Not able to be assessed   POSITIONAL TESTING: Right Dix-Hallpike: need to test Left Dix-Hallpike: upbeating, left nystagmus and symptomatic Left Sidelying:  upbeating, left nystagmus and symptomatic with pt becoming nauseous - therapist provides cool wash cloth for symptom management  Performed CRM x1 for L posterior canal BPPV  Gait training afterward using rollator to ensure pt safety, with him reporting feeling better than he was initially after repositioning maneuver.  Provided ANPT educational handouts on BPPV and What to do after BPPV repositioning.     PATIENT EDUCATION: Education details: goals, POC Person educated: Patient and Spouse Education method: Explanation, Demonstration, Tactile cues, and Verbal  cues Education comprehension: verbalized understanding, returned demonstration, verbal cues required, tactile cues required, and needs further education  HOME EXERCISE PROGRAM:  Give in upcoming sessions   GOALS: Goals reviewed with patient? Yes  SHORT TERM GOALS: Target date: 06/11/2023  Patient will be independent in home exercise program to improve strength/mobility for better functional independence with ADLs. Goal status: INITIAL    LONG TERM GOALS: Target date: 08/06/2023  Patient will reduce dizziness handicap inventory score to <50, for less dizziness with ADLs and increased safety with home and work tasks.  Baseline: 05/21/2023:  42/moderate handicap Goal status: INITIAL  2.  Patient will increase Berg Balance score by > 6 points to demonstrate decreased fall risk during functional activities. Baseline: 5/6: 27 Goal status: INITIAL  3.  Patient will increase BLE gross strength to 4+/5 as to improve functional strength for independent gait, increased standing tolerance and increased ADL ability. Baseline: see above Goal status: INITIAL  4.  Patient will be able to perform five time sit to stand without UE support in <15 seconds to demonstrate improved LE strength and improved mobility at home and in the community.  Baseline: heavy BUE support 13 seconds Goal status: INITIAL    ASSESSMENT:  CLINICAL IMPRESSION:    Pt assessed again for vestibular dysfunction based on the prior sessions, and found to have no nystagmus or symptoms with the dix-hallpike test, or bilateral roll test, which shows that the initial session was able to correct some of the complications he was having.  Pt ultimately led through more neuro/balance based exercises and was much more steady in gait today.  Pt still drifting with head turns, but ultimately performed well.   Pt will continue to benefit from skilled therapy to address remaining deficits in order to improve overall QoL and return to PLOF.      OBJECTIVE IMPAIRMENTS: Abnormal gait, cardiopulmonary status limiting activity, decreased activity tolerance, decreased balance, decreased cognition, decreased endurance, decreased knowledge of condition, decreased mobility, difficulty walking, decreased strength, impaired perceived functional ability, improper body mechanics, postural dysfunction, obesity, and pain.   ACTIVITY LIMITATIONS: carrying, lifting, bending, standing, squatting, stairs, transfers, bed mobility, toileting, locomotion level, and caring for others  PARTICIPATION LIMITATIONS: meal prep, cleaning, laundry, driving, shopping, community activity, and yard work  PERSONAL FACTORS: Age, Fitness, Past/current experiences, Time since onset of injury/illness/exacerbation, Transportation, and 3+ comorbidities: dementia, CVA July and September 2023( L occipital lobe and L thalamus) and 2018, A fib,chronic pain due to neuropathy, HTN, DM, HLD, OSA, anemia, Barrett's esophagus, depression, prostate cancer, obesity are also affecting patient's functional outcome.   REHAB POTENTIAL: Good  CLINICAL DECISION MAKING: Evolving/moderate complexity  EVALUATION COMPLEXITY: Moderate  PLAN:  PT FREQUENCY: 2x/week  PT DURATION: 12 weeks  PLANNED INTERVENTIONS: 97164- PT Re-evaluation, 97750- Physical Performance Testing, 97110-Therapeutic exercises, 97530- Therapeutic activity, [id number]- Neuromuscular re-education, 97535- Self Care, 16109- Manual therapy, [id number]- Gait training, [id number]- Orthotic Initial, [id number]- Orthotic/Prosthetic subsequent, [phone]- Canalith repositioning, [id number]- Electrical stimulation (unattended), [id number]- Electrical stimulation (manual),  96295- Vasopneumatic device, [id number]- Traction (mechanical), 28413- Ionotophoresis 4mg /ml Dexamethasone , Patient/Family education, Balance training, Stair training, Taping, Dry Needling, Joint mobilization, Spinal mobilization, Vestibular training, Visual/preceptual remediation/compensation, DME  instructions, Cryotherapy, Moist heat, and Biofeedback  PLAN FOR NEXT SESSION:   - Give HEP - standing balance  - narrow BOS  - reaching  - foot taps   - eyes closed - dynamic gait training with use of rollator for safety as needed     Rozanna Corner, PT, DPT Physical Therapist - [facility]  05/27/23, 1:31 PM

## 2023-05-28 DIAGNOSIS — Z7901 Long term (current) use of anticoagulants: Secondary | ICD-10-CM | POA: Diagnosis not present

## 2023-05-28 DIAGNOSIS — Z8673 Personal history of transient ischemic attack (TIA), and cerebral infarction without residual deficits: Secondary | ICD-10-CM | POA: Diagnosis not present

## 2023-05-31 ENCOUNTER — Ambulatory Visit: Admitting: Physical Therapy

## 2023-06-05 ENCOUNTER — Ambulatory Visit

## 2023-06-05 ENCOUNTER — Encounter: Payer: Self-pay | Admitting: Gastroenterology

## 2023-06-05 DIAGNOSIS — R42 Dizziness and giddiness: Secondary | ICD-10-CM

## 2023-06-05 DIAGNOSIS — M6281 Muscle weakness (generalized): Secondary | ICD-10-CM

## 2023-06-05 DIAGNOSIS — R2681 Unsteadiness on feet: Secondary | ICD-10-CM | POA: Diagnosis not present

## 2023-06-05 NOTE — Therapy (Signed)
 OUTPATIENT PHYSICAL THERAPY TREATMENT   Patient Name: Taylor Austin MRN: [id number] DOB:September 23, 1940, 83 y.o., male Today's Date: 06/05/2023  PCP: Newell Bard PROVIDER: Devora Folks  END OF SESSION:   PT End of Session - 06/05/23 1107     Visit Number 4    Number of Visits 24    Date for PT Re-Evaluation 08/06/23    PT Start Time 1100    PT Stop Time 1140    PT Time Calculation (min) 40 min    Activity Tolerance Patient tolerated treatment well;No increased pain    Behavior During Therapy WFL for tasks assessed/performed              Past Medical History:  Diagnosis Date   Allergy    Anemia    Arthritis    Back pain    Barrett esophagus    BPH (benign prostatic hyperplasia)    Chronic kidney disease    COPD (chronic obstructive pulmonary disease) (HCC)    Coronary artery disease    Depression    Diabetes mellitus without complication (HCC)    Diverticulitis    Dysphagia    Esophageal reflux    Esophageal reflux    GERD (gastroesophageal reflux disease)    Heart murmur    Hyperlipidemia    Hypersomnia    Hypertension    Kidney stones    Low testosterone    OAB (overactive bladder)    Obesity    Presence of dental bridge    2 - top   Rheumatic fever    Sleep apnea    BiPAP   Stroke (HCC) 01/2016   Vertigo    Vitamin B12 deficiency    Past Surgical History:  Procedure Laterality Date   BACK SURGERY     CARDIAC CATHETERIZATION  08/11/2014   Procedure: Left Heart Cath and Coronary Angiography;  Surgeon: Ronney Cola, MD;  Location: [facility];  Service: Cardiovascular;;   CARDIAC CATHETERIZATION N/A 11/17/2014   Procedure: Right Heart Cath;  Surgeon: Ronney Cola, MD;  Location: [facility];  Service: Cardiovascular;  Laterality: N/A;   CAROTID STENT     CATARACT EXTRACTION W/PHACO Left 04/03/2016   Procedure: CATARACT EXTRACTION PHACO AND INTRAOCULAR LENS PLACEMENT (IOC) left diabetic;  Surgeon: Rosa College,  MD;  Location: [facility];  Service: Ophthalmology;  Laterality: Left;  diabetic - oral meds sleep apnea   CATARACT EXTRACTION W/PHACO Right 05/01/2016   Procedure: CATARACT EXTRACTION PHACO AND INTRAOCULAR LENS PLACEMENT (IOC) Right diabetic;  Surgeon: Rosa College, MD;  Location: [facility];  Service: Ophthalmology;  Laterality: Right;  Diabetic oral meds sleep apnea   CERVICAL SPINE SURGERY     COLONOSCOPY     COLONOSCOPY WITH PROPOFOL  N/A 06/10/2015   Procedure: COLONOSCOPY WITH PROPOFOL ;  Surgeon: Cassie Click, MD;  Location: [facility] END[facility];  Service: Endoscopy;  Laterality: N/A;   COLONOSCOPY WITH PROPOFOL  N/A 02/28/2023   Procedure: COLONOSCOPY WITH PROPOFOL ;  Surgeon: Marnee Sink, MD;  Location: [facility] ENDOSCOPY;  Service: Endoscopy;  Laterality: N/A;   CORONARY ANGIOPLASTY     ESOPHAGOGASTRODUODENOSCOPY N/A 02/28/2023   Procedure: ESOPHAGOGASTRODUODENOSCOPY (EGD);  Surgeon: Marnee Sink, MD;  Location: [facility] ENDOSCOPY;  Service: Endoscopy;  Laterality: N/A;   ESOPHAGOGASTRODUODENOSCOPY (EGD) WITH PROPOFOL  N/A 06/28/2017   Procedure: ESOPHAGOGASTRODUODENOSCOPY (EGD) WITH PROPOFOL ;  Surgeon: Cassie Click, MD;  Location: [facility] ENDOSCOPY;  Service: Endoscopy;  Laterality: N/A;   ESOPHAGOGASTRODUODENOSCOPY ENDOSCOPY     EYE SURGERY  LUMBAR SPINE SURGERY     POLYPECTOMY  02/28/2023   Procedure: POLYPECTOMY;  Surgeon: Marnee Sink, MD;  Location: [facility] ENDOSCOPY;  Service: Endoscopy;;   THORACIC SPINE SURGERY     TONSILLECTOMY     Patient Active Problem List   Diagnosis Date Noted   Polyp of descending colon 02/28/2023   Duodenal ulceration 02/28/2023   Symptomatic anemia 02/26/2023   Upper GI bleed 02/26/2023   Morbid obesity (HCC) 02/26/2023   History of ischemic stroke (07/23/2021) 12/11/2021   Stroke-like symptoms 09/09/2021   Chronic diastolic CHF (congestive heart failure) (HCC) 07/26/2021   Ischemic stroke (HCC) 07/23/2021   Acute ischemic stroke  (HCC) 07/23/2021   Cerebral infarction, unspecified (HCC) 07/23/2021   Fasting hyperglycemia 07/18/2021   Orthostatic hypotension 07/18/2021   Lumbar facet syndrome 12/20/2020   Vertigo, Chronic 12/07/2020   Retrolisthesis of L2 over L3 12/07/2020   At high risk for falls 12/07/2020   Aortic atherosclerosis (HCC) 09/20/2020   Hypomagnesemia 07/27/2020   Spondylosis without myelopathy or radiculopathy, lumbosacral region 07/27/2020   Mixed Alzheimer's and vascular dementia (HCC) 06/17/2020   Chronic pain syndrome 06/15/2020   Pharmacologic therapy 06/15/2020   Disorder of skeletal system 06/15/2020   Problems influencing health status 06/15/2020   Abnormal MRI, cervical spine (11/14/2019) 06/15/2020   Abnormal MRI, lumbar spine (11/14/2019) 06/15/2020   History of lumbar laminectomy 06/15/2020   History of fusion of cervical spine 06/15/2020   DDD (degenerative disc disease), cervical 06/15/2020   DDD (degenerative disc disease), lumbosacral 06/15/2020   Lumbosacral facet arthropathy (Multilevel) (Bilateral) 06/15/2020   Cervical facet hypertrophy 06/15/2020   DJD (degenerative joint disease) of cervical spine 06/15/2020   Chronic anticoagulation (Plavix ) 06/15/2020   Lumbar facet joint pain (Bilateral) 06/15/2020   History of CVA (cerebrovascular accident) 06/15/2020   Poor historian 06/15/2020   Chronic low back pain (1ry area of Pain) (Bilateral) (R>L) w/o sciatica 12/15/2019   Lumbar stenosis with neurogenic claudication 12/15/2019   Memory loss, short term 10/09/2019   Chronic obstructive pulmonary disease (HCC) 09/18/2018   Morbid obesity with BMI of 40.0-44.9, adult (HCC) 04/03/2018   Hyperlipidemia associated with type 2 diabetes mellitus (HCC) 02/05/2018   Prostate cancer (HCC) 09/08/2017   Gastroesophageal reflux disease without esophagitis 07/29/2017   Other dysphagia 07/29/2017   Acute cerebrovascular accident (CVA) (HCC) 01/30/2016   Chronic kidney disease  07/14/2014   Clinical depression 07/14/2014   Gait instability 05/05/2014   Arthritis of knee 07/03/2013   Arteriosclerosis of coronary artery 07/03/2013   Type 2 diabetes mellitus with diabetic neuropathy, without long-term current use of insulin  (HCC) 07/03/2013   Benign essential HTN 07/03/2013   HLD (hyperlipidemia) 07/03/2013   Breath shortness 07/03/2013   Atherosclerotic heart disease of native coronary artery without angina pectoris 07/03/2013   Sleep apnea 06/18/2013   History of Barrett's esophagus 01/08/2001    ONSET DATE: on and off for years  REFERRING DIAG: imbalance  THERAPY DIAG:  Unsteadiness on feet  Dizziness and giddiness  Muscle weakness (generalized)  Rationale for Evaluation and Treatment: Rehabilitation  SUBJECTIVE:  SUBJECTIVE STATEMENT: No updates, not dizzy today.   Pt accompanied by: significant other (wife, Sharman Debar) in waiting room   PERTINENT HISTORY: Patient presents with referral for imbalance. He has additional PMH of dementia, CVA July and September 2023 ( L occipital lobe and L thalamus) and 2018, A fib,chronic pain due to neuropathy, HTN, DM, HLD, OSA, anemia, Barrett's esophagus, depression, prostate cancer, obesity.  Patient did have a fall in February 2025 where he got out of a car and fell forward. Patient has vertigo all the time per patient and his legs are giving out on him. Sees orthopedics about chronic DJD of knees, history of joint injections into his knees.   PAIN:  Are you having pain? Yes, bilat knee pain   PRECAUTIONS: Fall  RED FLAGS: None   WEIGHT BEARING RESTRICTIONS: No  FALLS: Has patient fallen in last 6 months? Yes. Number of falls 2  LIVING ENVIRONMENT: Lives with: lives with their spouse Lives in: House/apartment Stairs: 1  step onto the porch, and one onto the house; has a chair lift for inside the house Has following equipment at home: Walker - 4 wheeled, shower chair, and Grab bars  PLOF: Independent with basic ADLs  PATIENT GOALS: to conquer the vertigo and strengthen his BLE.   OBJECTIVE:  Note: Objective measures were completed at Evaluation unless otherwise noted.  DIAGNOSTIC FINDINGS:  Lower Extremity EMG 09/16/2018 - This is an abnormal electrodiagnostic study consistent with a 1) generalized sensory polyneuropathy. 2) Superimposed bilateral S1 radiculopathies.   MRI C-Spine 11/13/2019: Prior ACDF at C5-C7 without residual spinal stenosis. Persistent uncovertebral spurring with residual mild to moderate bilateral C6 and C7 foraminal stenosis as above. Mild disc bulging with uncovertebral and facet hypertrophy at C3-4 and C4-5 with resultant mild spinal stenosis but no cord deformity. Moderate left and mild right C4 and C5 foraminal narrowing. Moderate multilevel facet arthrosis throughout the cervical spine as above, which could contribute to underlying neck pain.   MRI L-Spine 11/13/2019: Multilevel degenerative changes of the lumbar spine as described above, progressed at L2-L3 with there is now moderate spinal canal stenosis. Unchanged moderate to severe spinal canal stenosis and severe Left neuroforaminal stenosis at L3-L4. Unchanged moderate bilateral neuro foraminal stenosis at L5-S1.   COGNITION: Overall cognitive status: Impaired, History of cognitive impairments - at baseline, and late onset dementia   SENSATION: WFL  COORDINATION: Heel slide test: WFL  POSTURE: rounded shoulders, forward head, and flexed trunk   LOWER EXTREMITY MMT:    MMT Right Eval Left Eval  Hip flexion 4 4  Hip extension    Hip abduction 4 4  Hip adduction 4 4  Hip internal rotation    Hip external rotation    Knee flexion 4- 4-  Knee extension 4- 4  Ankle dorsiflexion 4- 4-  Ankle plantarflexion 4- 4-   Ankle inversion    Ankle eversion    (Blank rows = not tested)  TRANSFERS: Sit to stand: CGA  Assistive device utilized: Environmental consultant - 4 wheeled     Stand to sit: CGA  Assistive device utilized: Environmental consultant - 4 wheeled     Chair to chair: CGA and Min A  Assistive device utilized: Environmental consultant - 4 wheeled       GAIT: Findings: Gait Characteristics: step through pattern, decreased stride length, shuffling, trunk flexed, poor foot clearance- Right, and poor foot clearance- Left, Distance walked: 60 ft, Assistive device utilized:Walker - 4 wheeled, Level of assistance: CGA, and Comments: increased knee flexion with  fatigue  FUNCTIONAL TESTS:  5 times sit to stand: 13 seconds heavy  BUE support  10 meter walk test: 9 seconds with 4 [employer] Berg Balance Scale: 27  PATIENT SURVEYS:  None given                                                                                                                              TREATMENT DATE: 06/05/23 Vitals taken after walk into clinic: 148/52 68bpm, manual pulse (dinamap says 42 bpm)  -STS from chair, hands free 1x10, supervision level  -STS from chair, hands free 1x10, supervision level  -vitals recheck: 145/62mmHg, 67bpm (Left radial pulse irregular, with long pauses at times between appreciable beats)  -alternate forward/backward walking in // bars 6x, 2 hands on // bars  -lateral stepping 4 round trips in // bars, elevated RR, post vitals: 97% SpO2, 116bpm, pain in left knee  -6" step taps with single hand support on Rt x10, Lt x10, then anterior single hand support Rt x10, Lt x10  -in // bars, hands free alternating 180 degree turns  -alternating turn and look behind yous    PATIENT EDUCATION: Education details: goals, POC Person educated: Patient and Spouse Education method: Explanation, Demonstration, Tactile cues, and Verbal cues Education comprehension: verbalized understanding, returned demonstration, verbal cues required, tactile cues required, and needs  further education  HOME EXERCISE PROGRAM: Access Code: [email or web address] URL: [email or web address] Date: 06/05/2023 Prepared by: Atlee Blanks  Exercises - Standing March with Counter Support  - 1 x daily - 7 x weekly - 3 sets - 20 reps - Side Stepping with Counter Support  - 1 x daily - 7 x weekly - 3 sets - 20 reps  GOALS: Goals reviewed with patient? Yes  SHORT TERM GOALS: Target date: 06/11/2023 Patient will be independent in home exercise program to improve strength/mobility for better functional independence with ADLs. Goal status: INITIAL  LONG TERM GOALS: Target date: 08/06/2023 Patient will reduce dizziness handicap inventory score to <50, for less dizziness with ADLs and increased safety with home and work tasks.  Baseline: 05/21/2023:  42/moderate handicap Goal status: INITIAL  2.  Patient will increase Berg Balance score by > 6 points to demonstrate decreased fall risk during functional activities. Baseline: 5/6: 27 Goal status: INITIAL  3.  Patient will increase BLE gross strength to 4+/5 as to improve functional strength for independent gait, increased standing tolerance and increased ADL ability. Baseline: see above Goal status: INITIAL  4.  Patient will be able to perform five time sit to stand without UE support in <15 seconds to demonstrate improved LE strength and improved mobility at home and in the community.  Baseline: heavy BUE support 13 seconds Goal status: INITIAL  ASSESSMENT:  CLINICAL IMPRESSION:  Continued with balance strength interventions. Pt has some elevated HR with activities. Monitored vitals throughout, noted irregular HR at rest, easily provoked tachycardia. HEP issued. Pt will continue to benefit from skilled therapy to address  remaining deficits in order to improve overall QOL and return to PLOF.    OBJECTIVE IMPAIRMENTS: Abnormal gait, cardiopulmonary status limiting activity, decreased activity tolerance, decreased balance,  decreased cognition, decreased endurance, decreased knowledge of condition, decreased mobility, difficulty walking, decreased strength, impaired perceived functional ability, improper body mechanics, postural dysfunction, obesity, and pain.   ACTIVITY LIMITATIONS: carrying, lifting, bending, standing, squatting, stairs, transfers, bed mobility, toileting, locomotion level, and caring for others  PARTICIPATION LIMITATIONS: meal prep, cleaning, laundry, driving, shopping, community activity, and yard work  PERSONAL FACTORS: Age, Fitness, Past/current experiences, Time since onset of injury/illness/exacerbation, Transportation, and 3+ comorbidities: dementia, CVA July and September 2023( L occipital lobe and L thalamus) and 2018, A fib,chronic pain due to neuropathy, HTN, DM, HLD, OSA, anemia, Barrett's esophagus, depression, prostate cancer, obesity are also affecting patient's functional outcome.   REHAB POTENTIAL: Good  CLINICAL DECISION MAKING: Evolving/moderate complexity  EVALUATION COMPLEXITY: Moderate  PLAN:  PT FREQUENCY: 2x/week  PT DURATION: 12 weeks  PLANNED INTERVENTIONS: 97164- PT Re-evaluation, 97750- Physical Performance Testing, 97110-Therapeutic exercises, 97530- Therapeutic activity, [id number]- Neuromuscular re-education, 97535- Self Care, 40981- Manual therapy, [id number]- Gait training, [phone]- Orthotic Initial, [phone]- Orthotic/Prosthetic subsequent, [phone]- Canalith repositioning, M5784- Electrical stimulation (unattended), [phone]- Electrical stimulation (manual), [id number]- Vasopneumatic device, [id number]- Traction (mechanical), [id number]- Ionotophoresis 4mg /ml Dexamethasone , Patient/Family education, Balance training, Stair training, Taping, Dry Needling, Joint mobilization, Spinal mobilization, Vestibular training, Visual/preceptual remediation/compensation, DME instructions, Cryotherapy, Moist heat, and Biofeedback  PLAN FOR NEXT SESSION:  - standing balance  - narrow BOS  - reaching  - foot  taps   - eyes closed - dynamic gait training with use of rollator for safety as needed   11:13 AM, 06/05/23 Dawn Eth, PT, DPT Physical Therapist - [facility]  Outpatient Physical Therapy- Main Campus [phone]

## 2023-06-06 ENCOUNTER — Encounter
Admission: RE | Disposition: A | Discharge status: Home / Self Care | Payer: Self-pay | Source: Home / Self Care | Attending: Gastroenterology

## 2023-06-06 ENCOUNTER — Ambulatory Visit
Admission: RE | Admit: 2023-06-06 | Discharge: 2023-06-06 | Disposition: A | Discharge status: Home / Self Care | Attending: Gastroenterology | Admitting: Gastroenterology

## 2023-06-06 ENCOUNTER — Encounter: Payer: Self-pay | Admitting: Gastroenterology

## 2023-06-06 ENCOUNTER — Ambulatory Visit: Admitting: Anesthesiology

## 2023-06-06 ENCOUNTER — Other Ambulatory Visit: Payer: Self-pay

## 2023-06-06 DIAGNOSIS — G4730 Sleep apnea, unspecified: Secondary | ICD-10-CM | POA: Insufficient documentation

## 2023-06-06 DIAGNOSIS — D509 Iron deficiency anemia, unspecified: Secondary | ICD-10-CM | POA: Insufficient documentation

## 2023-06-06 DIAGNOSIS — K2270 Barrett's esophagus without dysplasia: Secondary | ICD-10-CM | POA: Diagnosis not present

## 2023-06-06 DIAGNOSIS — Z7901 Long term (current) use of anticoagulants: Secondary | ICD-10-CM | POA: Insufficient documentation

## 2023-06-06 DIAGNOSIS — J449 Chronic obstructive pulmonary disease, unspecified: Secondary | ICD-10-CM | POA: Insufficient documentation

## 2023-06-06 DIAGNOSIS — Z79899 Other long term (current) drug therapy: Secondary | ICD-10-CM | POA: Diagnosis not present

## 2023-06-06 DIAGNOSIS — Z7984 Long term (current) use of oral hypoglycemic drugs: Secondary | ICD-10-CM | POA: Diagnosis not present

## 2023-06-06 DIAGNOSIS — Z794 Long term (current) use of insulin: Secondary | ICD-10-CM | POA: Insufficient documentation

## 2023-06-06 DIAGNOSIS — I130 Hypertensive heart and chronic kidney disease with heart failure and stage 1 through stage 4 chronic kidney disease, or unspecified chronic kidney disease: Secondary | ICD-10-CM | POA: Diagnosis not present

## 2023-06-06 DIAGNOSIS — Z09 Encounter for follow-up examination after completed treatment for conditions other than malignant neoplasm: Secondary | ICD-10-CM | POA: Diagnosis not present

## 2023-06-06 DIAGNOSIS — E66813 Obesity, class 3: Secondary | ICD-10-CM | POA: Diagnosis not present

## 2023-06-06 DIAGNOSIS — I2510 Atherosclerotic heart disease of native coronary artery without angina pectoris: Secondary | ICD-10-CM | POA: Diagnosis not present

## 2023-06-06 DIAGNOSIS — Z6841 Body Mass Index (BMI) 40.0 and over, adult: Secondary | ICD-10-CM | POA: Diagnosis not present

## 2023-06-06 DIAGNOSIS — K219 Gastro-esophageal reflux disease without esophagitis: Secondary | ICD-10-CM | POA: Diagnosis not present

## 2023-06-06 DIAGNOSIS — I509 Heart failure, unspecified: Secondary | ICD-10-CM | POA: Insufficient documentation

## 2023-06-06 DIAGNOSIS — E1122 Type 2 diabetes mellitus with diabetic chronic kidney disease: Secondary | ICD-10-CM | POA: Diagnosis not present

## 2023-06-06 DIAGNOSIS — F0390 Unspecified dementia without behavioral disturbance: Secondary | ICD-10-CM | POA: Diagnosis not present

## 2023-06-06 DIAGNOSIS — N189 Chronic kidney disease, unspecified: Secondary | ICD-10-CM | POA: Insufficient documentation

## 2023-06-06 DIAGNOSIS — E785 Hyperlipidemia, unspecified: Secondary | ICD-10-CM | POA: Diagnosis not present

## 2023-06-06 HISTORY — DX: Orthostatic hypotension: I95.1

## 2023-06-06 HISTORY — DX: Heart failure, unspecified: I50.9

## 2023-06-06 HISTORY — PX: ESOPHAGOGASTRODUODENOSCOPY: SHX5428

## 2023-06-06 LAB — GLUCOSE, CAPILLARY: Glucose-Capillary: 153 mg/dL — ABNORMAL HIGH (ref 70–99)

## 2023-06-06 SURGERY — EGD (ESOPHAGOGASTRODUODENOSCOPY)
Anesthesia: General

## 2023-06-06 MED ORDER — PROPOFOL 10 MG/ML IV BOLUS
INTRAVENOUS | Status: DC | PRN
  Administered 2023-06-06: 70 mg via INTRAVENOUS
  Administered 2023-06-06 (×2): 20 mg via INTRAVENOUS

## 2023-06-06 MED ORDER — SODIUM CHLORIDE 0.9 % IV SOLN
INTRAVENOUS | Status: DC
Start: 2023-06-06 — End: 2023-06-06

## 2023-06-06 MED ORDER — LIDOCAINE HCL (CARDIAC) PF 100 MG/5ML IV SOSY
PREFILLED_SYRINGE | INTRAVENOUS | Status: DC | PRN
  Administered 2023-06-06: 80 mg via INTRAVENOUS

## 2023-06-06 MED ORDER — PROPOFOL 500 MG/50ML IV EMUL
INTRAVENOUS | Status: DC | PRN
  Administered 2023-06-06: 140 ug/kg/min via INTRAVENOUS

## 2023-06-06 NOTE — Op Note (Signed)
 [facility] Gastroenterology Patient Name: Taylor Austin Procedure Date: 06/06/2023 9:25 AM MRN: [id number] Account #: [id number] Date of Birth: January 31, 1940 Admit Type: Outpatient Age: 83 Room: [facility] ENDO ROOM 1 Gender: Male Note Status: Supervisor Override Instrument Name: Cristino Donna Endoscope [id number] Procedure:             Upper GI endoscopy Indications:           Follow-up of acute duodenal ulcer, Iron deficiency                         anemia Providers:             Bridgett Camps, DO Referring MD:          Quintin Buckle DO, DO (Referring MD), No Local                         Md, MD (Referring MD) Medicines:             Monitored Anesthesia Care Complications:         No immediate complications. Estimated blood loss: None. Procedure:             Pre-Anesthesia Assessment:                        - Prior to the procedure, a History and Physical was                         performed, and patient medications and allergies were                         reviewed. The patient is competent. The risks and                         benefits of the procedure and the sedation options and                         risks were discussed with the patient. All questions                         were answered and informed consent was obtained.                         Patient identification and proposed procedure were                         verified by the physician, the nurse, the anesthetist                         and the technician in the endoscopy suite. Mental                         Status Examination: alert and oriented. Airway                         Examination: normal oropharyngeal airway and neck                         mobility. Respiratory Examination: clear to  auscultation. CV Examination: RRR, no murmurs, no S3                         or S4. Prophylactic Antibiotics: The patient does not                         require prophylactic  antibiotics. Prior                         Anticoagulants: The patient has taken no anticoagulant                         or antiplatelet agents. ASA Grade Assessment: IV - A                         patient with severe systemic disease that is a                         constant threat to life. After reviewing the risks and                         benefits, the patient was deemed in satisfactory                         condition to undergo the procedure. The anesthesia                         plan was to use monitored anesthesia care (MAC).                         Immediately prior to administration of medications,                         the patient was re-assessed for adequacy to receive                         sedatives. The heart rate, respiratory rate, oxygen                         saturations, blood pressure, adequacy of pulmonary                         ventilation, and response to care were monitored                         throughout the procedure. The physical status of the                         patient was re-assessed after the procedure.                        After obtaining informed consent, the endoscope was                         passed under direct vision. Throughout the procedure,                         the patient's blood pressure, pulse, and oxygen  saturations were monitored continuously. The Endoscope                         was introduced through the mouth, and advanced to the                         second part of duodenum. The upper GI endoscopy was                         accomplished without difficulty. The patient tolerated                         the procedure well. Findings:      The duodenal bulb, first portion of the duodenum and second portion of       the duodenum were normal. Previous ulcer has healed. Estimated blood       loss: none.      The entire examined stomach was normal. Estimated blood loss: none.       Previous h  pylori testing (2019) negative      The Z-line was regular. Estimated blood loss: none.      Esophagogastric landmarks were identified: the gastroesophageal junction       was found at 41 cm from the incisors.      The exam of the esophagus was otherwise normal. Impression:            - Normal duodenal bulb, first portion of the duodenum                         and second portion of the duodenum.                        - Normal stomach.                        - Z-line regular.                        - Esophagogastric landmarks identified.                        - No specimens collected. Recommendation:        - Patient has a contact number available for                         emergencies. The signs and symptoms of potential                         delayed complications were discussed with the patient.                         Return to normal activities tomorrow. Written                         discharge instructions were provided to the patient.                        - Discharge patient to home.                        - Resume  previous diet.                        - Continue present medications.                        - Resume Coumadin (warfarin) at prior dose today.                         Refer to managing physician for further adjustment of                         therapy.                        - Return to GI clinic as previously scheduled.                        - The findings and recommendations were discussed with                         the patient.                        - The findings and recommendations were discussed with                         the patient's family.                        - Can consider video capsule endoscopy if no response                         to iron infusions.                        May be nutritional deficit given reduced appetite                         reported by patient's wife. Procedure Code(s):     --- Professional ---                         [phone], Esophagogastroduodenoscopy, flexible,                         transoral; diagnostic, including collection of                         specimen(s) by brushing or washing, when performed                         (separate procedure) Diagnosis Code(s):     --- Professional ---                        D50.9, Iron deficiency anemia, unspecified CPT copyright 2022 American Medical Association. All rights reserved. The codes documented in this report are preliminary and upon coder review may  be revised to meet current compliance requirements. Attending Participation:      I personally performed the entire procedure. Polo Brisk, DO Quintin Buckle DO, DO 06/06/2023 9:42:17 AM This report has been signed electronically. Number  of Addenda: 0 Note Initiated On: 06/06/2023 9:25 AM Estimated Blood Loss:  Estimated blood loss: none.      [facility]

## 2023-06-06 NOTE — Anesthesia Preprocedure Evaluation (Addendum)
 Anesthesia Evaluation  Patient identified by MRN, date of birth, ID band Patient awake    Reviewed: Allergy & Precautions, NPO status , Patient's Chart, lab work & pertinent test results  History of Anesthesia Complications (+) DIFFICULT AIRWAY and history of anesthetic complications (in 2006)  Airway Mallampati: IV   Neck ROM: Full    Dental  (+) Missing   Pulmonary sleep apnea (on BiPAP) , COPD, former smoker (quit 1975)   Pulmonary exam normal breath sounds clear to auscultation       Cardiovascular hypertension, + CAD (s/p MI and stents)  Normal cardiovascular exam+ dysrhythmias (a fib on warfarin and Eliquis)  Rhythm:Regular Rate:Normal  ECG 05/03/23:  Sinus rhythm with occasional premature ventricular complexes  Left axis deviation  Low voltage QRS  Inferior infarct (cited on or before 31-Dec-2013)  Cannot rule out Anteroseptal infarct (cited on or before 03-Jul-2013)  Abnormal ECG  When compared with ECG of 26-Dec-2022 12:07,  No significant change was found     Neuro/Psych  PSYCHIATRIC DISORDERS  Depression   Dementia Vertigo  CVA (x3, last in 2023; no residual deficits)    GI/Hepatic ,GERD (Barrett esophagus)  ,,  Endo/Other  diabetes, Type 2  Class 3 obesity  Renal/GU      Musculoskeletal   Abdominal   Peds  Hematology  (+) Blood dyscrasia, anemia   Anesthesia Other Findings Cardiology note 05/03/23:  83 y.o. male with  1. Coronary artery disease involving native coronary artery of native heart without angina pectoris  2. History of CVA (cerebrovascular accident)  3. Anticoagulation monitoring, INR range 2-3  4. Status post placement of implantable loop recorder  5. History of placement of stent in LAD coronary artery  6. Chronic diastolic CHF (congestive heart failure) (CMS/HHS-HCC)  7. Paroxysmal atrial fibrillation (CMS/HHS-HCC)  8. NSVT (nonsustained ventricular tachycardia) (CMS/HHS-HCC)  9.  Vertigo  10. Obstructive sleep apnea syndrome  11. Diabetes mellitus type 2, insulin  dependent (CMS/HHS-HCC)  12. Former tobacco use  13. Chronic obstructive pulmonary disease, unspecified COPD type (CMS/HHS-HCC)  14. Class 3 severe obesity due to excess calories with body mass index (BMI) of 40.0 to 44.9 in adult, unspecified whether serious comorbidity present   Plan  Obesity recommend weight loss exercise portion control Loop recorder for evaluation of high-grade arrhythmia A-fib with ventricular tachycardia remarkable for A-fib continue current therapy still on Coumadin History of CVA presumably related to atrial fibrillation continue anticoagulation with Coumadin COPD continue inhalers as necessary GERD agree with Protonix  therapy for reflux type symptoms Hyperlipidemia maintain Lipitor therapy for lipid management Diabetes type 2 uncomplicated on Actos  metformin  Soliqua Obstructive sleep apnea commend sleep study CPAP weight loss Have the patient follow-up in 6 months  Return in about 6 months (around 11/02/2023).    Reproductive/Obstetrics                             Anesthesia Physical Anesthesia Plan  ASA: 4  Anesthesia Plan: General   Post-op Pain Management:    Induction: Intravenous  PONV Risk Score and Plan: 2 and Propofol  infusion, TIVA and Treatment may vary due to age or medical condition  Airway Management Planned: Natural Airway  Additional Equipment:   Intra-op Plan:   Post-operative Plan:   Informed Consent: I have reviewed the patients History and Physical, chart, labs and discussed the procedure including the risks, benefits and alternatives for the proposed anesthesia with the patient or authorized representative who has indicated  his/her understanding and acceptance.       Plan Discussed with: CRNA  Anesthesia Plan Comments: (LMA/GETA backup discussed.  Patient consented for risks of anesthesia including but not  limited to:  - adverse reactions to medications - damage to eyes, teeth, lips or other oral mucosa - nerve damage due to positioning  - sore throat or hoarseness - damage to heart, brain, nerves, lungs, other parts of body or loss of life  Informed patient about role of CRNA in peri- and intra-operative care.  Patient voiced understanding.)        Anesthesia Quick Evaluation

## 2023-06-06 NOTE — Anesthesia Postprocedure Evaluation (Signed)
 Anesthesia Post Note  Patient: Taylor Austin  Procedure(s) Performed: EGD (ESOPHAGOGASTRODUODENOSCOPY)  Patient location during evaluation: PACU Anesthesia Type: General Level of consciousness: awake and alert, oriented and patient cooperative Pain management: pain level controlled Vital Signs Assessment: post-procedure vital signs reviewed and stable Respiratory status: spontaneous breathing, nonlabored ventilation and respiratory function stable Cardiovascular status: blood pressure returned to baseline and stable Postop Assessment: adequate PO intake Anesthetic complications: no   No notable events documented.   Last Vitals:  Vitals:   06/06/23 0942 06/06/23 1002  BP: (!) 155/69 (!) 155/69  Pulse: 90 90  Resp: (!) 26 19  Temp: (!) 36.1 C   SpO2: 99% 99%    Last Pain:  Vitals:   06/06/23 1002  TempSrc:   PainSc: 0-No pain                 Dorothey Gate

## 2023-06-06 NOTE — Interval H&P Note (Signed)
 History and Physical Interval Note: Preprocedure H&P from 06/06/23  was reviewed and there was no interval change after seeing and examining the patient.  Written consent was obtained from the patient after discussion of risks, benefits, and alternatives. Patient has consented to proceed with Esophagogastroduodenoscopy with possible intervention   06/06/2023 9:24 AM  [name]  has presented today for surgery, with the diagnosis of Iron deficiency anemia, unspecified iron deficiency anemia type (D50.9) Duodenal ulcer (K26.9).  The various methods of treatment have been discussed with the patient and family. After consideration of risks, benefits and other options for treatment, the patient has consented to  Procedure(s): EGD (ESOPHAGOGASTRODUODENOSCOPY) (N/A) as a surgical intervention.  The patient's history has been reviewed, patient examined, no change in status, stable for surgery.  I have reviewed the patient's chart and labs.  Questions were answered to the patient's satisfaction.     Taylor Austin

## 2023-06-06 NOTE — Transfer of Care (Signed)
 Immediate Anesthesia Transfer of Care Note  Patient: Taylor Austin  Procedure(s) Performed: EGD (ESOPHAGOGASTRODUODENOSCOPY)  Patient Location: PACU  Anesthesia Type:General  Level of Consciousness: awake, alert , and oriented  Airway & Oxygen Therapy: Patient Spontanous Breathing  Post-op Assessment: Report given to RN and Post -op Vital signs reviewed and stable  Post vital signs: Reviewed and stable  Last Vitals:  Vitals Value Taken Time  BP    Temp    Pulse    Resp    SpO2      Last Pain:  Vitals:   06/06/23 0851  TempSrc: Temporal  PainSc: 0-No pain         Complications: No notable events documented.

## 2023-06-06 NOTE — H&P (Signed)
 Pre-Procedure H&P   Patient ID: Taylor Austin is a 83 y.o. male.  Gastroenterology Provider: Quintin Buckle, DO  Referring Provider: Laquetta Plank, PA PCP: Rory Collard, MD  Date: 06/06/2023  HPI Taylor Austin is a 83 y.o. male who presents today for Esophagogastroduodenoscopy for Iron deficiency anemia, duodenal ulcer . His wife is at bedside.  Patient was hospitalized in [place] for UGIB from duodenal ulcer. Hgb down to 5.1 and improved to 7.6 with transfusion upon discharge. Currently hgb is still 7.9.  Started protonix  last month Last took his coumadin 5 days ago  No n/v/dysphagia or odynophagia  No melena/hematochezia  Ferritin 5 sat 5% hgb 7.9 mcv 79.5 plt 265K   Past Medical History:  Diagnosis Date   Allergy    Anemia    Arthritis    Back pain    Barrett esophagus    BPH (benign prostatic hyperplasia)    CHF (congestive heart failure) (HCC)    Chronic kidney disease    COPD (chronic obstructive pulmonary disease) (HCC)    Coronary artery disease    Depression    Diabetes mellitus without complication (HCC)    Diverticulitis    Dysphagia    Esophageal reflux    Esophageal reflux    GERD (gastroesophageal reflux disease)    Heart murmur    Hyperlipidemia    Hypersomnia    Hypertension    Kidney stones    Low testosterone    OAB (overactive bladder)    Obesity    Orthostatic hypotension    Presence of dental bridge    2 - top   Rheumatic fever    Sleep apnea    BiPAP   Stroke (HCC) 01/2016   Vertigo    Vitamin B12 deficiency     Past Surgical History:  Procedure Laterality Date   BACK SURGERY     CARDIAC CATHETERIZATION  08/11/2014   Procedure: Left Heart Cath and Coronary Angiography;  Surgeon: Ronney Cola, MD;  Location: [facility];  Service: Cardiovascular;;   CARDIAC CATHETERIZATION N/A 11/17/2014   Procedure: Right Heart Cath;  Surgeon: Ronney Cola, MD;  Location: [facility];  Service:  Cardiovascular;  Laterality: N/A;   CAROTID STENT     CATARACT EXTRACTION W/PHACO Left 04/03/2016   Procedure: CATARACT EXTRACTION PHACO AND INTRAOCULAR LENS PLACEMENT (IOC) left diabetic;  Surgeon: Rosa College, MD;  Location: [facility];  Service: Ophthalmology;  Laterality: Left;  diabetic - oral meds sleep apnea   CATARACT EXTRACTION W/PHACO Right 05/01/2016   Procedure: CATARACT EXTRACTION PHACO AND INTRAOCULAR LENS PLACEMENT (IOC) Right diabetic;  Surgeon: Rosa College, MD;  Location: [facility];  Service: Ophthalmology;  Laterality: Right;  Diabetic oral meds sleep apnea   CERVICAL SPINE SURGERY     COLONOSCOPY     COLONOSCOPY WITH PROPOFOL  N/A 06/10/2015   Procedure: COLONOSCOPY WITH PROPOFOL ;  Surgeon: Cassie Click, MD;  Location: [facility];  Service: Endoscopy;  Laterality: N/A;   COLONOSCOPY WITH PROPOFOL  N/A 02/28/2023   Procedure: COLONOSCOPY WITH PROPOFOL ;  Surgeon: Marnee Sink, MD;  Location: [facility] ENDOSCOPY;  Service: Endoscopy;  Laterality: N/A;   CORONARY ANGIOPLASTY     ESOPHAGOGASTRODUODENOSCOPY N/A 02/28/2023   Procedure: ESOPHAGOGASTRODUODENOSCOPY (EGD);  Surgeon: Marnee Sink, MD;  Location: [facility] ENDOSCOPY;  Service: Endoscopy;  Laterality: N/A;   ESOPHAGOGASTRODUODENOSCOPY (EGD) WITH PROPOFOL  N/A 06/28/2017   Procedure: ESOPHAGOGASTRODUODENOSCOPY (EGD) WITH PROPOFOL ;  Surgeon: Cassie Click, MD;  Location:  [facility] ENDOSCOPY;  Service: Endoscopy;  Laterality: N/A;   ESOPHAGOGASTRODUODENOSCOPY ENDOSCOPY     EYE SURGERY     LUMBAR SPINE SURGERY     POLYPECTOMY  02/28/2023   Procedure: POLYPECTOMY;  Surgeon: Marnee Sink, MD;  Location: [facility] ENDOSCOPY;  Service: Endoscopy;;   THORACIC SPINE SURGERY     TONSILLECTOMY      Family History No h/o GI disease or malignancy  Review of Systems  Constitutional:  Negative for activity change, appetite change, chills, diaphoresis, fatigue, fever and unexpected weight change.  HENT:  Negative for  trouble swallowing and voice change.   Respiratory:  Negative for shortness of breath and wheezing.   Cardiovascular:  Negative for chest pain, palpitations and leg swelling.  Gastrointestinal:  Negative for abdominal distention, abdominal pain, anal bleeding, blood in stool, constipation, diarrhea, nausea and vomiting.  Musculoskeletal:  Negative for arthralgias and myalgias.  Skin:  Negative for color change and pallor.  Neurological:  Negative for dizziness, syncope and weakness.  Psychiatric/Behavioral:  Negative for confusion. The patient is not nervous/anxious.   All other systems reviewed and are negative.    Medications No current facility-administered medications on file prior to encounter.   Current Outpatient Medications on File Prior to Encounter  Medication Sig Dispense Refill   albuterol (VENTOLIN HFA) 108 (90 Base) MCG/ACT inhaler Inhale into the lungs every 6 (six) hours as needed for wheezing or shortness of breath.     atorvastatin  (LIPITOR) 40 MG tablet Take 1 tablet by mouth daily.     calcium  carbonate (OSCAL) 1500 (600 Ca) MG TABS tablet Take 600 mg of elemental calcium  by mouth daily with lunch.     Cholecalciferol (VITAMIN D-3) 25 MCG (1000 UT) CAPS Take 1,000 Units by mouth daily.     donepezil  (ARICEPT ) 10 MG tablet Take 10 mg by mouth every evening.     finasteride  (PROSCAR ) 5 MG tablet TAKE 1 TABLET EVERY DAY 90 tablet 1   Insulin  Glargine-Lixisenatide (SOLIQUA) 100-33 UNT-MCG/ML SOPN Inject 50 Units into the skin daily. Takes once daily before breakfast     magnesium  oxide (MAG-OX) 400 MG tablet Take 400 mg by mouth daily with lunch.     metFORMIN  (GLUCOPHAGE -XR) 500 MG 24 hr tablet Take 1,000 mg by mouth 2 (two) times daily with a meal.     Multiple Vitamin (MULTI-VITAMINS) TABS Take 1 tablet by mouth daily with lunch.     oxymetazoline  (AFRIN) 0.05 % nasal spray Place 1 spray into both nostrils 2 (two) times daily as needed for congestion.     pantoprazole   (PROTONIX ) 40 MG tablet Take 1 tablet (40 mg total) by mouth daily.     pioglitazone  (ACTOS ) 15 MG tablet Take 45 mg by mouth every evening.     tamsulosin  (FLOMAX ) 0.4 MG CAPS capsule Take 1 capsule (0.4 mg total) by mouth daily after supper. 90 capsule 3   vitamin B-12 (CYANOCOBALAMIN ) 1000 MCG tablet Take 1,000 mcg by mouth daily with lunch.     DULoxetine  (CYMBALTA ) 60 MG capsule Take 60 mg by mouth daily with supper. (Patient not taking: Reported on 05/14/2023)     ELIQUIS 5 MG TABS tablet Take 1 tablet (5 mg total) by mouth 2 (two) times daily. Transition to warfarin as per cardio.  As per patient's wife they will transition to warfarin after finishing the Eliquis. (Patient not taking: Reported on 06/06/2023)     gabapentin  (NEURONTIN ) 100 MG capsule Take 100 mg by mouth 5 (five) times daily. (Patient  not taking: Reported on 05/14/2023)     glimepiride (AMARYL) 4 MG tablet Take 2 mg by mouth in the morning. (Patient not taking: Reported on 05/14/2023)     Insulin  Degludec-Liraglutide (XULTOPHY) 100-3.6 UNIT-MG/ML SOPN Inject 50 Units into the skin daily. (Only use if blood glucose is >70) (Patient not taking: Reported on 06/06/2023)     losartan  (COZAAR ) 50 MG tablet Take 25 mg by mouth daily.     pantoprazole  (PROTONIX ) 40 MG tablet Take 1 tablet (40 mg total) by mouth 2 (two) times daily for 28 days. 56 tablet 0   sildenafil  (REVATIO ) 20 MG tablet 1-5 tablets as needed one hour prior to intercourse 30 tablet 3   sildenafil  (REVATIO ) 20 MG tablet Take 20 mg by mouth 3 (three) times daily.     Tiotropium Bromide  Monohydrate (SPIRIVA  RESPIMAT) 2.5 MCG/ACT AERS Inhale into the lungs.     Tiotropium Bromide  Monohydrate 2.5 MCG/ACT AERS Inhale 2 each into the lungs at bedtime.     warfarin (COUMADIN) 3 MG tablet Take 3 mg by mouth daily.      Pertinent medications related to GI and procedure were reviewed by me with the patient prior to the procedure   Current Facility-Administered Medications:     0.9 %  sodium chloride  infusion, , Intravenous, Continuous, Quintin Buckle, DO, Last Rate: 20 mL/hr at 06/06/23 0909, New Bag at 06/06/23 0909  sodium chloride  20 mL/hr at 06/06/23 0909       Allergies  Allergen Reactions   Hydrocodone Shortness Of Breath   Oxycodone-Acetaminophen  Shortness Of Breath and Swelling    Confirmed with wife- tongue swelling and SOB after oxycodone   Allergies were reviewed by me prior to the procedure  Objective   Body mass index is 43.27 kg/m. Vitals:   06/06/23 0851  BP: (!) 159/73  Pulse: 72  Resp: 16  Temp: (!) 96 F (35.6 C)  TempSrc: Temporal  SpO2: 99%  Weight: 117.9 kg  Height: 5\' 5"  (1.651 m)     Physical Exam Vitals and nursing note reviewed.  Constitutional:      General: He is not in acute distress.    Appearance: Normal appearance. He is obese. He is not ill-appearing, toxic-appearing or diaphoretic.  HENT:     Head: Normocephalic and atraumatic.     Nose: Nose normal.     Mouth/Throat:     Mouth: Mucous membranes are moist.     Pharynx: Oropharynx is clear.  Eyes:     General: No scleral icterus.    Extraocular Movements: Extraocular movements intact.  Cardiovascular:     Rate and Rhythm: Normal rate and regular rhythm.     Heart sounds: Normal heart sounds. No murmur heard.    No friction rub. No gallop.  Pulmonary:     Effort: Pulmonary effort is normal. No respiratory distress.     Breath sounds: Normal breath sounds. No wheezing, rhonchi or rales.  Abdominal:     General: Bowel sounds are normal. There is no distension.     Palpations: Abdomen is soft.     Tenderness: There is no abdominal tenderness. There is no guarding or rebound.  Musculoskeletal:     Cervical back: Neck supple.     Right lower leg: No edema.     Left lower leg: No edema.  Skin:    General: Skin is warm and dry.     Coloration: Skin is not jaundiced or pale.  Neurological:     General:  No focal deficit present.     Mental  Status: He is alert and oriented to person, place, and time. Mental status is at baseline.  Psychiatric:        Mood and Affect: Mood normal.      Assessment:  Taylor Austin is a 83 y.o. male  who presents today for Esophagogastroduodenoscopy for duodenal ulcer, iron deficiency anemia.  Plan:  Esophagogastroduodenoscopy with possible intervention today  Esophagogastroduodenoscopy with possible biopsy, control of bleeding, polypectomy, and interventions as necessary has been discussed with the patient/patient representative. Informed consent was obtained from the patient/patient representative after explaining the indication, nature, and risks of the procedure including but not limited to death, bleeding, perforation, missed neoplasm/lesions, cardiorespiratory compromise, and reaction to medications. Opportunity for questions was given and appropriate answers were provided. Patient/patient representative has verbalized understanding is amenable to undergoing the procedure.   Quintin Buckle, DO  [facility]  Portions of the record may have been created with voice recognition software. Occasional wrong-word or 'sound-a-like' substitutions may have occurred due to the inherent limitations of voice recognition software.  Read the chart carefully and recognize, using context, where substitutions may have occurred.

## 2023-06-07 ENCOUNTER — Encounter: Payer: Self-pay | Admitting: Gastroenterology

## 2023-06-10 ENCOUNTER — Ambulatory Visit: Attending: Neurology | Admitting: Physical Therapy

## 2023-06-10 DIAGNOSIS — R42 Dizziness and giddiness: Secondary | ICD-10-CM | POA: Diagnosis not present

## 2023-06-10 DIAGNOSIS — M542 Cervicalgia: Secondary | ICD-10-CM | POA: Diagnosis not present

## 2023-06-10 DIAGNOSIS — M6281 Muscle weakness (generalized): Secondary | ICD-10-CM | POA: Diagnosis not present

## 2023-06-10 DIAGNOSIS — R2681 Unsteadiness on feet: Secondary | ICD-10-CM | POA: Diagnosis not present

## 2023-06-10 DIAGNOSIS — R262 Difficulty in walking, not elsewhere classified: Secondary | ICD-10-CM | POA: Insufficient documentation

## 2023-06-10 NOTE — Therapy (Signed)
OUTPATIENT PHYSICAL THERAPY TREATMENT   Patient Name: Taylor Austin MRN: [id number] DOB:11/05/1940, 83 y.o., male Today's Date: 06/10/2023  PCP: Newell Bard PROVIDER: Devora Folks  END OF SESSION:   PT End of Session - 06/10/23 1320     Visit Number 5    Number of Visits 24    Date for PT Re-Evaluation 08/06/23    PT Start Time 1318    PT Stop Time 1400    PT Time Calculation (min) 42 min    Equipment Utilized During Treatment Gait belt    Activity Tolerance Patient tolerated treatment well    Behavior During Therapy WFL for tasks assessed/performed               Past Medical History:  Diagnosis Date   Allergy    Anemia    Arthritis    Back pain    Barrett esophagus    BPH (benign prostatic hyperplasia)    CHF (congestive heart failure) (HCC)    Chronic kidney disease    COPD (chronic obstructive pulmonary disease) (HCC)    Coronary artery disease    Depression    Diabetes mellitus without complication (HCC)    Diverticulitis    Dysphagia    Esophageal reflux    Esophageal reflux    GERD (gastroesophageal reflux disease)    Heart murmur    Hyperlipidemia    Hypersomnia    Hypertension    Kidney stones    Low testosterone    OAB (overactive bladder)    Obesity    Orthostatic hypotension    Presence of dental bridge    2 - top   Rheumatic fever    Sleep apnea    BiPAP   Stroke (HCC) 01/2016   Vertigo    Vitamin B12 deficiency    Past Surgical History:  Procedure Laterality Date   BACK SURGERY     CARDIAC CATHETERIZATION  08/11/2014   Procedure: Left Heart Cath and Coronary Angiography;  Surgeon: Ronney Cola, MD;  Location: [facility];  Service: Cardiovascular;;   CARDIAC CATHETERIZATION N/A 11/17/2014   Procedure: Right Heart Cath;  Surgeon: Ronney Cola, MD;  Location: [facility];  Service: Cardiovascular;  Laterality: N/A;   CAROTID STENT     CATARACT EXTRACTION W/PHACO Left 04/03/2016   Procedure:  CATARACT EXTRACTION PHACO AND INTRAOCULAR LENS PLACEMENT (IOC) left diabetic;  Surgeon: Rosa College, MD;  Location: [facility];  Service: Ophthalmology;  Laterality: Left;  diabetic - oral meds sleep apnea   CATARACT EXTRACTION W/PHACO Right 05/01/2016   Procedure: CATARACT EXTRACTION PHACO AND INTRAOCULAR LENS PLACEMENT (IOC) Right diabetic;  Surgeon: Rosa College, MD;  Location: [facility];  Service: Ophthalmology;  Laterality: Right;  Diabetic oral meds sleep apnea   CERVICAL SPINE SURGERY     COLONOSCOPY     COLONOSCOPY WITH PROPOFOL  N/A 06/10/2015   Procedure: COLONOSCOPY WITH PROPOFOL ;  Surgeon: Cassie Click, MD;  Location: [facility] ENDOSCOPY;  Service: Endoscopy;  Laterality: N/A;   COLONOSCOPY WITH PROPOFOL  N/A 02/28/2023   Procedure: COLONOSCOPY WITH PROPOFOL ;  Surgeon: Marnee Sink, MD;  Location: [facility] ENDOSCOPY;  Service: Endoscopy;  Laterality: N/A;   CORONARY ANGIOPLASTY     ESOPHAGOGASTRODUODENOSCOPY N/A 02/28/2023   Procedure: ESOPHAGOGASTRODUODENOSCOPY (EGD);  Surgeon: Marnee Sink, MD;  Location: [facility] ENDOSCOPY;  Service: Endoscopy;  Laterality: N/A;   ESOPHAGOGASTRODUODENOSCOPY N/A 06/06/2023   Procedure: EGD (ESOPHAGOGASTRODUODENOSCOPY);  Surgeon: Quintin Buckle, DO;  Location: [facility] ENDOSCOPY;  Service: Gastroenterology;  Laterality: N/A;   ESOPHAGOGASTRODUODENOSCOPY (EGD) WITH PROPOFOL  N/A 06/28/2017   Procedure: ESOPHAGOGASTRODUODENOSCOPY (EGD) WITH PROPOFOL ;  Surgeon: Cassie Click, MD;  Location: [facility] ENDOSCOPY;  Service: Endoscopy;  Laterality: N/A;   ESOPHAGOGASTRODUODENOSCOPY ENDOSCOPY     EYE SURGERY     LUMBAR SPINE SURGERY     POLYPECTOMY  02/28/2023   Procedure: POLYPECTOMY;  Surgeon: Marnee Sink, MD;  Location: [facility] ENDOSCOPY;  Service: Endoscopy;;   THORACIC SPINE SURGERY     TONSILLECTOMY     Patient Active Problem List   Diagnosis Date Noted   Polyp of descending colon 02/28/2023   Duodenal ulceration 02/28/2023    Symptomatic anemia 02/26/2023   Upper GI bleed 02/26/2023   Morbid obesity (HCC) 02/26/2023   History of ischemic stroke (07/23/2021) 12/11/2021   Stroke-like symptoms 09/09/2021   Chronic diastolic CHF (congestive heart failure) (HCC) 07/26/2021   Ischemic stroke (HCC) 07/23/2021   Acute ischemic stroke (HCC) 07/23/2021   Cerebral infarction, unspecified (HCC) 07/23/2021   Fasting hyperglycemia 07/18/2021   Orthostatic hypotension 07/18/2021   Lumbar facet syndrome 12/20/2020   Vertigo, Chronic 12/07/2020   Retrolisthesis of L2 over L3 12/07/2020   At high risk for falls 12/07/2020   Aortic atherosclerosis (HCC) 09/20/2020   Hypomagnesemia 07/27/2020   Spondylosis without myelopathy or radiculopathy, lumbosacral region 07/27/2020   Mixed Alzheimer's and vascular dementia (HCC) 06/17/2020   Chronic pain syndrome 06/15/2020   Pharmacologic therapy 06/15/2020   Disorder of skeletal system 06/15/2020   Problems influencing health status 06/15/2020   Abnormal MRI, cervical spine (11/14/2019) 06/15/2020   Abnormal MRI, lumbar spine (11/14/2019) 06/15/2020   History of lumbar laminectomy 06/15/2020   History of fusion of cervical spine 06/15/2020   DDD (degenerative disc disease), cervical 06/15/2020   DDD (degenerative disc disease), lumbosacral 06/15/2020   Lumbosacral facet arthropathy (Multilevel) (Bilateral) 06/15/2020   Cervical facet hypertrophy 06/15/2020   DJD (degenerative joint disease) of cervical spine 06/15/2020   Chronic anticoagulation (Plavix ) 06/15/2020   Lumbar facet joint pain (Bilateral) 06/15/2020   History of CVA (cerebrovascular accident) 06/15/2020   Poor historian 06/15/2020   Chronic low back pain (1ry area of Pain) (Bilateral) (R>L) w/o sciatica 12/15/2019   Lumbar stenosis with neurogenic claudication 12/15/2019   Memory loss, short term 10/09/2019   Chronic obstructive pulmonary disease (HCC) 09/18/2018   Morbid obesity with BMI of 40.0-44.9, adult (HCC)  04/03/2018   Hyperlipidemia associated with type 2 diabetes mellitus (HCC) 02/05/2018   Prostate cancer (HCC) 09/08/2017   Gastroesophageal reflux disease without esophagitis 07/29/2017   Other dysphagia 07/29/2017   Acute cerebrovascular accident (CVA) (HCC) 01/30/2016   Chronic kidney disease 07/14/2014   Clinical depression 07/14/2014   Gait instability 05/05/2014   Arthritis of knee 07/03/2013   Arteriosclerosis of coronary artery 07/03/2013   Type 2 diabetes mellitus with diabetic neuropathy, without long-term current use of insulin  (HCC) 07/03/2013   Benign essential HTN 07/03/2013   HLD (hyperlipidemia) 07/03/2013   Breath shortness 07/03/2013   Atherosclerotic heart disease of native coronary artery without angina pectoris 07/03/2013   Sleep apnea 06/18/2013   History of Barrett's esophagus 01/08/2001    ONSET DATE: on and off for years  REFERRING DIAG: imbalance  THERAPY DIAG:  Unsteadiness on feet  Dizziness and giddiness  Muscle weakness (generalized)  Rationale for Evaluation and Treatment: Rehabilitation  SUBJECTIVE:  SUBJECTIVE STATEMENT:  Pt reports he has a hx of a-fib. Pt states he had an esophagogastroduodenoscopy on 5/29 for iron deficiency anemia, duodenal ulcer. Pt reports he is feeling good following that procedure.  Reports dizziness is "not bad," but he still gets vertigo when he moves quickly. States it isn't as bad as it was before therapist did the repositional maneuvers.   States his L medial knee is bothering him some, rated as 0/10 when sitting but states it increases when he walks, but states the walker helps manage his pain.  Pt accompanied by: significant other (wife, Sharman Debar) in waiting room   PERTINENT HISTORY: Patient presents with referral for  imbalance. He has additional PMH of dementia, CVA July and September 2023 ( L occipital lobe and L thalamus) and 2018, A fib,chronic pain due to neuropathy, HTN, DM, HLD, OSA, anemia, Barrett's esophagus, depression, prostate cancer, obesity.  Patient did have a fall in February 2025 where he got out of a car and fell forward. Patient has vertigo all the time per patient and his legs are giving out on him. Sees orthopedics about chronic DJD of knees, history of joint injections into his knees.   PAIN:  Are you having pain? Yes, bilat knee pain   PRECAUTIONS: Fall  RED FLAGS: None   WEIGHT BEARING RESTRICTIONS: No  FALLS: Has patient fallen in last 6 months? Yes. Number of falls 2  LIVING ENVIRONMENT: Lives with: lives with their spouse Lives in: House/apartment Stairs: 1 step onto the porch, and one onto the house; has a chair lift for inside the house Has following equipment at home: Walker - 4 wheeled, shower chair, and Grab bars  PLOF: Independent with basic ADLs  PATIENT GOALS: to conquer the vertigo and strengthen his BLE.   OBJECTIVE:  Note: Objective measures were completed at Evaluation unless otherwise noted.  DIAGNOSTIC FINDINGS:  Lower Extremity EMG 09/16/2018 - This is an abnormal electrodiagnostic study consistent with a 1) generalized sensory polyneuropathy. 2) Superimposed bilateral S1 radiculopathies.   MRI C-Spine 11/13/2019: Prior ACDF at C5-C7 without residual spinal stenosis. Persistent uncovertebral spurring with residual mild to moderate bilateral C6 and C7 foraminal stenosis as above. Mild disc bulging with uncovertebral and facet hypertrophy at C3-4 and C4-5 with resultant mild spinal stenosis but no cord deformity. Moderate left and mild right C4 and C5 foraminal narrowing. Moderate multilevel facet arthrosis throughout the cervical spine as above, which could contribute to underlying neck pain.   MRI L-Spine 11/13/2019: Multilevel degenerative changes of the  lumbar spine as described above, progressed at L2-L3 with there is now moderate spinal canal stenosis. Unchanged moderate to severe spinal canal stenosis and severe Left neuroforaminal stenosis at L3-L4. Unchanged moderate bilateral neuro foraminal stenosis at L5-S1.   COGNITION: Overall cognitive status: Impaired, History of cognitive impairments - at baseline, and late onset dementia   SENSATION: WFL  COORDINATION: Heel slide test: WFL  POSTURE: rounded shoulders, forward head, and flexed trunk   LOWER EXTREMITY MMT:    MMT Right Eval Left Eval  Hip flexion 4 4  Hip extension    Hip abduction 4 4  Hip adduction 4 4  Hip internal rotation    Hip external rotation    Knee flexion 4- 4-  Knee extension 4- 4  Ankle dorsiflexion 4- 4-  Ankle plantarflexion 4- 4-  Ankle inversion    Ankle eversion    (Blank rows = not tested)  TRANSFERS: Sit to stand: CGA  Assistive device utilized: Environmental consultant -  4 wheeled     Stand to sit: CGA  Assistive device utilized: Environmental consultant - 4 wheeled     Chair to chair: CGA and Min A  Assistive device utilized: Environmental consultant - 4 wheeled       GAIT: Findings: Gait Characteristics: step through pattern, decreased stride length, shuffling, trunk flexed, poor foot clearance- Right, and poor foot clearance- Left, Distance walked: 60 ft, Assistive device utilized:Walker - 4 wheeled, Level of assistance: CGA, and Comments: increased knee flexion with fatigue  FUNCTIONAL TESTS:  5 times sit to stand: 13 seconds heavy  BUE support  10 meter walk test: 9 seconds with 4 [employer] Berg Balance Scale: 27  PATIENT SURVEYS:  None given                                                                                                                              TREATMENT DATE: 06/10/23  Pt ambulates into clinic using his rollator with supervision.  Vitals to start session: BP 159/70 (MAP 97), HR 70-85bpm (pt w/ hx of a-fib), SpO2 100%  B LE functional strengthening including:   Sit<>stands from elevated EOM, no UE support, 3x 10 reps Pt reports some lightheadedness with this and becomes winded by 3rd set, allowing 30-60sec rest breaks between sets   Dynamic gait training including:  Short distance ~34ft down/back laps x5 reps, x2 sets Pt reports turning at end of each lap causes him to become dizzy with slight increased instability noted Assessed vitals after 1st set: HR 98bpm and SpO2 100% This makes pt very winded due to decreased cardiovascular endurance Forward/backwards gait - ~48ft down/back x4 laps, x2 sets Pt states it feels like everything else is spinning still when he sits down, reporting this intervention causes some of his dizziness   Standing balance interventions including: Alternating foot taps to brown step x10 reps using L HHA for min A  This causes increased L knee pain; therefore, discontinued, and pt performed seated LAQs for knee pain management NBOS 2x 30 sec This continues to be fairly challenging with pt having slight postural sway NBOS x10 horizontal and vertical head nods  Educated pt on differentiating vertigo vs lightheadedness vs imbalance rather than always using the word "dizziness" to help therapist better differentiate appropriate interventions.     PATIENT EDUCATION: Education details: goals, POC Person educated: Patient and Spouse Education method: Explanation, Demonstration, Tactile cues, and Verbal cues Education comprehension: verbalized understanding, returned demonstration, verbal cues required, tactile cues required, and needs further education  HOME EXERCISE PROGRAM: Access Code: [email or web address] URL: [email or web address] Date: 06/05/2023 Prepared by: Atlee Blanks  Exercises - Standing March with Counter Support  - 1 x daily - 7 x weekly - 3 sets - 20 reps - Side Stepping with Counter Support  - 1 x daily - 7 x weekly - 3 sets - 20 reps  GOALS: Goals reviewed with patient? Yes  SHORT TERM  GOALS: Target date: 06/11/2023 Patient will be  independent in home exercise program to improve strength/mobility for better functional independence with ADLs. Goal status: INITIAL  LONG TERM GOALS: Target date: 08/06/2023 Patient will reduce dizziness handicap inventory score to <50, for less dizziness with ADLs and increased safety with home and work tasks.  Baseline: 05/21/2023:  42/moderate handicap Goal status: INITIAL  2.  Patient will increase Berg Balance score by > 6 points to demonstrate decreased fall risk during functional activities. Baseline: 5/6: 27 Goal status: INITIAL  3.  Patient will increase BLE gross strength to 4+/5 as to improve functional strength for independent gait, increased standing tolerance and increased ADL ability. Baseline: see above Goal status: INITIAL  4.  Patient will be able to perform five time sit to stand without UE support in <15 seconds to demonstrate improved LE strength and improved mobility at home and in the community.  Baseline: heavy BUE support 13 seconds Goal status: INITIAL  ASSESSMENT:  CLINICAL IMPRESSION:  Patient reports hx of a-fib and therapist continues to palpate irregular rhythm on manual assessment of pulse. Therapy session focused on functional B LE strengthening and dynamic gait training short distance without AD. Patient reports turning causes him to have some dizziness/imbalance. Therapist educates pt on importance of differentiating between vertigo vs lightheadedness vs imbalance to help therapist with appropriate interventions for his symptoms. Patient did have onset of L knee pain when performing standing alternating foot taps with only L HHA so discontinued that exercise. Patient continues to have standing imbalance during NBOS. Pt will continue to benefit from skilled physical therapy to address remaining deficits in order to improve overall QOL, decrease fall risk, decrease dizziness symptoms. and return to PLOF.     OBJECTIVE IMPAIRMENTS: Abnormal gait, cardiopulmonary status limiting activity, decreased activity tolerance, decreased balance, decreased cognition, decreased endurance, decreased knowledge of condition, decreased mobility, difficulty walking, decreased strength, impaired perceived functional ability, improper body mechanics, postural dysfunction, obesity, and pain.   ACTIVITY LIMITATIONS: carrying, lifting, bending, standing, squatting, stairs, transfers, bed mobility, toileting, locomotion level, and caring for others  PARTICIPATION LIMITATIONS: meal prep, cleaning, laundry, driving, shopping, community activity, and yard work  PERSONAL FACTORS: Age, Fitness, Past/current experiences, Time since onset of injury/illness/exacerbation, Transportation, and 3+ comorbidities: dementia, CVA July and September 2023( L occipital lobe and L thalamus) and 2018, A fib,chronic pain due to neuropathy, HTN, DM, HLD, OSA, anemia, Barrett's esophagus, depression, prostate cancer, obesity are also affecting patient's functional outcome.   REHAB POTENTIAL: Good  CLINICAL DECISION MAKING: Evolving/moderate complexity  EVALUATION COMPLEXITY: Moderate  PLAN:  PT FREQUENCY: 2x/week  PT DURATION: 12 weeks  PLANNED INTERVENTIONS: 97164- PT Re-evaluation, 97750- Physical Performance Testing, 97110-Therapeutic exercises, 97530- Therapeutic activity, [id number]- Neuromuscular re-education, 97535- Self Care, [id number]- Manual therapy, [id number]- Gait training, [id number]- Orthotic Initial, [phone]- Orthotic/Prosthetic subsequent, [phone]- Canalith repositioning, [id number]- Electrical stimulation (unattended), [phone]- Electrical stimulation (manual), [id number]- Vasopneumatic device, [id number]- Traction (mechanical), [id number]- Ionotophoresis 4mg /ml Dexamethasone , Patient/Family education, Balance training, Stair training, Taping, Dry Needling, Joint mobilization, Spinal mobilization, Vestibular training, Visual/preceptual remediation/compensation, DME  instructions, Cryotherapy, Moist heat, and Biofeedback  PLAN FOR NEXT SESSION:  - maybe re-assess positional tests if pt reporting additional bouts of vertigo; otherwise, feel pt likely experiencing imbalance vs lightheadedness - standing balance  - narrow BOS  - reaching  - foot taps   - eyes closed - dynamic gait training with use of rollator for safety as needed   Keyri Salberg, PT, DPT, NCS, CSRS Physical Therapist - [place]  [facility]  2:03 PM  06/10/23       

## 2023-06-12 ENCOUNTER — Ambulatory Visit

## 2023-06-12 DIAGNOSIS — I2510 Atherosclerotic heart disease of native coronary artery without angina pectoris: Secondary | ICD-10-CM | POA: Diagnosis not present

## 2023-06-12 DIAGNOSIS — Z95818 Presence of other cardiac implants and grafts: Secondary | ICD-10-CM | POA: Diagnosis not present

## 2023-06-13 ENCOUNTER — Ambulatory Visit: Admitting: Physical Therapy

## 2023-06-13 DIAGNOSIS — R42 Dizziness and giddiness: Secondary | ICD-10-CM

## 2023-06-13 DIAGNOSIS — M542 Cervicalgia: Secondary | ICD-10-CM | POA: Diagnosis not present

## 2023-06-13 DIAGNOSIS — M6281 Muscle weakness (generalized): Secondary | ICD-10-CM | POA: Diagnosis not present

## 2023-06-13 DIAGNOSIS — R262 Difficulty in walking, not elsewhere classified: Secondary | ICD-10-CM | POA: Diagnosis not present

## 2023-06-13 DIAGNOSIS — R2681 Unsteadiness on feet: Secondary | ICD-10-CM

## 2023-06-13 NOTE — Therapy (Signed)
 OUTPATIENT PHYSICAL THERAPY TREATMENT   Patient Name: Taylor Austin MRN: [id number] DOB:1940/08/27, 83 y.o., male Today's Date: 06/13/2023  PCP: Newell Bard PROVIDER: Devora Folks  END OF SESSION:   PT End of Session - 06/13/23 0931     Visit Number 6    Number of Visits 24    Date for PT Re-Evaluation 08/06/23    PT Start Time 0931    PT Stop Time 1015    PT Time Calculation (min) 44 min    Equipment Utilized During Treatment Gait belt    Activity Tolerance Patient tolerated treatment well    Behavior During Therapy WFL for tasks assessed/performed              Past Medical History:  Diagnosis Date   Allergy    Anemia    Arthritis    Back pain    Barrett esophagus    BPH (benign prostatic hyperplasia)    CHF (congestive heart failure) (HCC)    Chronic kidney disease    COPD (chronic obstructive pulmonary disease) (HCC)    Coronary artery disease    Depression    Diabetes mellitus without complication (HCC)    Diverticulitis    Dysphagia    Esophageal reflux    Esophageal reflux    GERD (gastroesophageal reflux disease)    Heart murmur    Hyperlipidemia    Hypersomnia    Hypertension    Kidney stones    Low testosterone    OAB (overactive bladder)    Obesity    Orthostatic hypotension    Presence of dental bridge    2 - top   Rheumatic fever    Sleep apnea    BiPAP   Stroke (HCC) 01/2016   Vertigo    Vitamin B12 deficiency    Past Surgical History:  Procedure Laterality Date   BACK SURGERY     CARDIAC CATHETERIZATION  08/11/2014   Procedure: Left Heart Cath and Coronary Angiography;  Surgeon: Ronney Cola, MD;  Location: [facility]IVE [facility];  Service: Cardiovascular;;   CARDIAC CATHETERIZATION N/A 11/17/2014   Procedure: Right Heart Cath;  Surgeon: Ronney Cola, MD;  Location: [facility] CV [facility];  Service: Cardiovascular;  Laterality: N/A;   CAROTID STENT     CATARACT EXTRACTION W/PHACO Left 04/03/2016   Procedure:  CATARACT EXTRACTION PHACO AND INTRAOCULAR LENS PLACEMENT (IOC) left diabetic;  Surgeon: Rosa College, MD;  Location: [facility];  Service: Ophthalmology;  Laterality: Left;  diabetic - oral meds sleep apnea   CATARACT EXTRACTION W/PHACO Right 05/01/2016   Procedure: CATARACT EXTRACTION PHACO AND INTRAOCULAR LENS PLACEMENT (IOC) Right diabetic;  Surgeon: Rosa College, MD;  Location: [facility];  Service: Ophthalmology;  Laterality: Right;  Diabetic oral meds sleep apnea   CERVICAL SPINE SURGERY     COLONOSCOPY     COLONOSCOPY WITH PROPOFOL  N/A 06/10/2015   Procedure: COLONOSCOPY WITH PROPOFOL ;  Surgeon: Cassie Click, MD;  Location: [facility] END[facility]COPY;  Service: Endoscopy;  Laterality: N/A;   COLONOSCOPY WITH PROPOFOL  N/A 02/28/2023   Procedure: COLONOSCOPY WITH PROPOFOL ;  Surgeon: Marnee Sink, MD;  Location: [facility] ENDOSCOPY;  Service: Endoscopy;  Laterality: N/A;   CORONARY ANGIOPLASTY     ESOPHAGOGASTRODUODENOSCOPY N/A 02/28/2023   Procedure: ESOPHAGOGASTRODUODENOSCOPY (EGD);  Surgeon: Marnee Sink, MD;  Location: [facility] ENDOSCOPY;  Service: Endoscopy;  Laterality: N/A;   ESOPHAGOGASTRODUODENOSCOPY N/A 06/06/2023   Procedure: EGD (ESOPHAGOGASTRODUODENOSCOPY);  Surgeon: Quintin Buckle, DO;  Location: [facility] ENDOSCOPY;  Service: Gastroenterology;  Laterality: N/A;   ESOPHAGOGASTRODUODENOSCOPY (EGD) WITH PROPOFOL  N/A 06/28/2017   Procedure: ESOPHAGOGASTRODUODENOSCOPY (EGD) WITH PROPOFOL ;  Surgeon: Cassie Click, MD;  Location: [facility] ENDOSCOPY;  Service: Endoscopy;  Laterality: N/A;   ESOPHAGOGASTRODUODENOSCOPY ENDOSCOPY     EYE SURGERY     LUMBAR SPINE SURGERY     POLYPECTOMY  02/28/2023   Procedure: POLYPECTOMY;  Surgeon: Marnee Sink, MD;  Location: [facility] ENDOSCOPY;  Service: Endoscopy;;   THORACIC SPINE SURGERY     TONSILLECTOMY     Patient Active Problem List   Diagnosis Date Noted   Polyp of descending colon 02/28/2023   Duodenal ulceration 02/28/2023    Symptomatic anemia 02/26/2023   Upper GI bleed 02/26/2023   Morbid obesity (HCC) 02/26/2023   History of ischemic stroke (07/23/2021) 12/11/2021   Stroke-like symptoms 09/09/2021   Chronic diastolic CHF (congestive heart failure) (HCC) 07/26/2021   Ischemic stroke (HCC) 07/23/2021   Acute ischemic stroke (HCC) 07/23/2021   Cerebral infarction, unspecified (HCC) 07/23/2021   Fasting hyperglycemia 07/18/2021   Orthostatic hypotension 07/18/2021   Lumbar facet syndrome 12/20/2020   Vertigo, Chronic 12/07/2020   Retrolisthesis of L2 over L3 12/07/2020   At high risk for falls 12/07/2020   Aortic atherosclerosis (HCC) 09/20/2020   Hypomagnesemia 07/27/2020   Spondylosis without myelopathy or radiculopathy, lumbosacral region 07/27/2020   Mixed Alzheimer's and vascular dementia (HCC) 06/17/2020   Chronic pain syndrome 06/15/2020   Pharmacologic therapy 06/15/2020   Disorder of skeletal system 06/15/2020   Problems influencing health status 06/15/2020   Abnormal MRI, cervical spine (11/14/2019) 06/15/2020   Abnormal MRI, lumbar spine (11/14/2019) 06/15/2020   History of lumbar laminectomy 06/15/2020   History of fusion of cervical spine 06/15/2020   DDD (degenerative disc disease), cervical 06/15/2020   DDD (degenerative disc disease), lumbosacral 06/15/2020   Lumbosacral facet arthropathy (Multilevel) (Bilateral) 06/15/2020   Cervical facet hypertrophy 06/15/2020   DJD (degenerative joint disease) of cervical spine 06/15/2020   Chronic anticoagulation (Plavix ) 06/15/2020   Lumbar facet joint pain (Bilateral) 06/15/2020   History of CVA (cerebrovascular accident) 06/15/2020   Poor historian 06/15/2020   Chronic low back pain (1ry area of Pain) (Bilateral) (R>L) w/o sciatica 12/15/2019   Lumbar stenosis with neurogenic claudication 12/15/2019   Memory loss, short term 10/09/2019   Chronic obstructive pulmonary disease (HCC) 09/18/2018   Morbid obesity with BMI of 40.0-44.9, adult (HCC)  04/03/2018   Hyperlipidemia associated with type 2 diabetes mellitus (HCC) 02/05/2018   Prostate cancer (HCC) 09/08/2017   Gastroesophageal reflux disease without esophagitis 07/29/2017   Other dysphagia 07/29/2017   Acute cerebrovascular accident (CVA) (HCC) 01/30/2016   Chronic kidney disease 07/14/2014   Clinical depression 07/14/2014   Gait instability 05/05/2014   Arthritis of knee 07/03/2013   Arteriosclerosis of coronary artery 07/03/2013   Type 2 diabetes mellitus with diabetic neuropathy, without long-term current use of insulin  (HCC) 07/03/2013   Benign essential HTN 07/03/2013   HLD (hyperlipidemia) 07/03/2013   Breath shortness 07/03/2013   Atherosclerotic heart disease of native coronary artery without angina pectoris 07/03/2013   Sleep apnea 06/18/2013   History of Barrett's esophagus 01/08/2001    ONSET DATE: on and off for years  REFERRING DIAG: imbalance  THERAPY DIAG:  Unsteadiness on feet  Dizziness and giddiness  Muscle weakness (generalized)  Rationale for Evaluation and Treatment: Rehabilitation  SUBJECTIVE:  SUBJECTIVE STATEMENT:  Cardiology MD visit on 06/12/2023   Pt states he is "doing good" except for his L>R knee pain rated as 5-6/10 when walking with rollator into clinic. Pt with bandages on his arm stating they are "kitty marks." Reports he felt good after last session. Pt reports "everything looked good" from his cardiologist. Pt states he has a loop recorder that sends updates to the MD.    Pt accompanied by: significant other (wife, Sharman Debar) in waiting room   PERTINENT HISTORY: Patient presents with referral for imbalance. He has additional PMH of dementia, CVA July and September 2023 ( L occipital lobe and L thalamus) and 2018, A fib,chronic pain due to  neuropathy, HTN, DM, HLD, OSA, anemia, Barrett's esophagus, depression, prostate cancer, obesity.  Patient did have a fall in February 2025 where he got out of a car and fell forward. Patient has vertigo all the time per patient and his legs are giving out on him. Sees orthopedics about chronic DJD of knees, history of joint injections into his knees. Pt has extensive cardiopulmonary hx with Paroxysmal atrial fibrillation and COPD.   PAIN:  Are you having pain? Yes, bilat knee pain   PRECAUTIONS: Fall, has inserted loop recorder  RED FLAGS: None   WEIGHT BEARING RESTRICTIONS: No  FALLS: Has patient fallen in last 6 months? Yes. Number of falls 2  LIVING ENVIRONMENT: Lives with: lives with their spouse Lives in: House/apartment Stairs: 1 step onto the porch, and one onto the house; has a chair lift for inside the house Has following equipment at home: Walker - 4 wheeled, shower chair, and Grab bars  PLOF: Independent with basic ADLs  PATIENT GOALS: to conquer the vertigo and strengthen his BLE.   OBJECTIVE:  Note: Objective measures were completed at Evaluation unless otherwise noted.  DIAGNOSTIC FINDINGS:  Lower Extremity EMG 09/16/2018 - This is an abnormal electrodiagnostic study consistent with a 1) generalized sensory polyneuropathy. 2) Superimposed bilateral S1 radiculopathies.   MRI C-Spine 11/13/2019: Prior ACDF at C5-C7 without residual spinal stenosis. Persistent uncovertebral spurring with residual mild to moderate bilateral C6 and C7 foraminal stenosis as above. Mild disc bulging with uncovertebral and facet hypertrophy at C3-4 and C4-5 with resultant mild spinal stenosis but no cord deformity. Moderate left and mild right C4 and C5 foraminal narrowing. Moderate multilevel facet arthrosis throughout the cervical spine as above, which could contribute to underlying neck pain.   MRI L-Spine 11/13/2019: Multilevel degenerative changes of the lumbar spine as described above,  progressed at L2-L3 with there is now moderate spinal canal stenosis. Unchanged moderate to severe spinal canal stenosis and severe Left neuroforaminal stenosis at L3-L4. Unchanged moderate bilateral neuro foraminal stenosis at L5-S1.   MRI Brain 09/09/21 1. Acute lacunar infarct in the right corona radiata.  2. Chronic small vessel ischemic disease with multiple chronic  infarcts as above.   COGNITION: Overall cognitive status: Impaired, History of cognitive impairments - at baseline, and late onset dementia   SENSATION: WFL  COORDINATION: Heel slide test: WFL  POSTURE: rounded shoulders, forward head, and flexed trunk   LOWER EXTREMITY MMT:    MMT Right Eval Left Eval  Hip flexion 4 4  Hip extension    Hip abduction 4 4  Hip adduction 4 4  Hip internal rotation    Hip external rotation    Knee flexion 4- 4-  Knee extension 4- 4  Ankle dorsiflexion 4- 4-  Ankle plantarflexion 4- 4-  Ankle inversion  Ankle eversion    (Blank rows = not tested)  TRANSFERS: Sit to stand: CGA  Assistive device utilized: Environmental consultant - 4 wheeled     Stand to sit: CGA  Assistive device utilized: Environmental consultant - 4 wheeled     Chair to chair: CGA and Min A  Assistive device utilized: Environmental consultant - 4 wheeled       GAIT: Findings: Gait Characteristics: step through pattern, decreased stride length, shuffling, trunk flexed, poor foot clearance- Right, and poor foot clearance- Left, Distance walked: 60 ft, Assistive device utilized:Walker - 4 wheeled, Level of assistance: CGA, and Comments: increased knee flexion with fatigue  FUNCTIONAL TESTS:  5 times sit to stand: 13 seconds heavy  BUE support  10 meter walk test: 9 seconds with 4 [employer] Berg Balance Scale: 27  PATIENT SURVEYS:  None given                                                                                                                              TREATMENT DATE: 06/13/23  Pt ambulates into clinic using his rollator with  supervision.  Dynamic gait training using rollator today due to knee pain, including:  ~164ft with dual-task of looking R and L to identify number of fingers held up by therapist while having to navigate around gym equipment Another 189ft as described, but switched dual-task to using playing cards as visual target Requires close SBA throughout for safety while working on head turns to visually scan environment 26min30sec forward/backwards gait ~75ft each direction, using rollator, with external targets of Blaze Pods (6) set on random setting with 0.5 sec delay (3 on either side of the walkway for head turning and reaching challenges) CGA for safety Increased L knee pain with this The Blaze Pod Random setting was chosen to enhance cognitive processing and agility, providing an unpredictable environment to simulate real-world scenarios, and fostering quick reactions and adaptability.   Dynamic standing balance interventions including: Standing with intermittent UE support on rollator while having 6 Blaze Pods placed at counter height in a circle around patient to promote repetitive turning for dizziness habituation Reports dizziness 6/10 with it coming down to 4/10 after seated rest break CGA for safety/steadying  Patient continues to complain of "lightheaded" dizziness so performed vital sign assessment.  Orthostatic vitals:  Sitting after above activities: BP 168/70 (MAP 99), HR 75bpm, SpO2 98% with pt reporting he feels "lightheaded" like he is "in a fog" Standing: BP 113/94 (MAP 99), HR 96bpm with pt reporting "a little lightheadedness"; states he is always lightheaded when he is up Sitting: BP 131/89 (MAP 99), HR 82bpm  Pt reports he hasn't eaten anything yet this morning because he is planning to eat after   Educated pt on change in his BP from sitting>standing and concern for possible orthostatic hypotension. Wrote down his vitals and provided them to his wife and educated her as well  since pt reporting she helps him remember things.  Reinforced education to pt on differentiating vertigo vs lightheadedness vs imbalance rather than always using the word "dizziness" to help therapist better differentiate appropriate interventions; however, pt with decreased carryover due to cognitive decline.    PATIENT EDUCATION: Education details: goals, POC Person educated: Patient and Spouse Education method: Explanation, Demonstration, Tactile cues, and Verbal cues Education comprehension: verbalized understanding, returned demonstration, verbal cues required, tactile cues required, and needs further education  HOME EXERCISE PROGRAM: Access Code: [email or web address] URL: [email or web address] Date: 06/05/2023 Prepared by: Atlee Blanks  Exercises - Standing March with Counter Support  - 1 x daily - 7 x weekly - 3 sets - 20 reps - Side Stepping with Counter Support  - 1 x daily - 7 x weekly - 3 sets - 20 reps  GOALS: Goals reviewed with patient? Yes  SHORT TERM GOALS: Target date: 06/11/2023 Patient will be independent in home exercise program to improve strength/mobility for better functional independence with ADLs. Goal status: INITIAL  LONG TERM GOALS: Target date: 08/06/2023 Patient will reduce dizziness handicap inventory score to <50, for less dizziness with ADLs and increased safety with home and work tasks.  Baseline: 05/21/2023:  42/moderate handicap Goal status: INITIAL  2.  Patient will increase Berg Balance score by > 6 points to demonstrate decreased fall risk during functional activities. Baseline: 5/6: 27 Goal status: INITIAL  3.  Patient will increase BLE gross strength to 4+/5 as to improve functional strength for independent gait, increased standing tolerance and increased ADL ability. Baseline: see above Goal status: INITIAL  4.  Patient will be able to perform five time sit to stand without UE support in <15 seconds to demonstrate improved LE strength  and improved mobility at home and in the community.  Baseline: heavy BUE support 13 seconds Goal status: INITIAL  ASSESSMENT:  CLINICAL IMPRESSION:  Therapy session focused on dynamic gait training using AD due to reports of increased knee pain today as well as dynamic standing balance with repetitive turning for dizziness habituation. Therapist reinforces education to pt on importance of differentiating between vertigo vs lightheadedness vs imbalance to help therapist with appropriate interventions for his symptoms. Patient continues to describe "lightheadedness" feeling so performed BP assessment sit>stand with pt demonstrating significant drop in his systolic BP indicative of possible orthostatic hypotension. Educated both pt and wife on this finding and recommendation to follow-up with cardiologist. Pt will continue to benefit from skilled physical therapy to address remaining deficits in order to improve overall QOL, decrease fall risk, decrease dizziness symptoms. and return to PLOF.    OBJECTIVE IMPAIRMENTS: Abnormal gait, cardiopulmonary status limiting activity, decreased activity tolerance, decreased balance, decreased cognition, decreased endurance, decreased knowledge of condition, decreased mobility, difficulty walking, decreased strength, impaired perceived functional ability, improper body mechanics, postural dysfunction, obesity, and pain.   ACTIVITY LIMITATIONS: carrying, lifting, bending, standing, squatting, stairs, transfers, bed mobility, toileting, locomotion level, and caring for others  PARTICIPATION LIMITATIONS: meal prep, cleaning, laundry, driving, shopping, community activity, and yard work  PERSONAL FACTORS: Age, Fitness, Past/current experiences, Time since onset of injury/illness/exacerbation, Transportation, and 3+ comorbidities: dementia, CVA July and September 2023( L occipital lobe and L thalamus) and 2018, A fib,chronic pain due to neuropathy, HTN, DM, HLD, OSA,  anemia, Barrett's esophagus, depression, prostate cancer, obesity are also affecting patient's functional outcome.   REHAB POTENTIAL: Good  CLINICAL DECISION MAKING: Evolving/moderate complexity  EVALUATION COMPLEXITY: Moderate  PLAN:  PT FREQUENCY: 2x/week  PT DURATION: 12 weeks  PLANNED INTERVENTIONS: 97164- PT Re-evaluation, 97750- Physical Performance  Testing, 97110-Therapeutic exercises, 97530- Therapeutic activity, [id number]- Neuromuscular re-education, [phone]- Self Care, 60454- Manual therapy, [id number]- Gait training, [phone]- Orthotic Initial, [phone]- Orthotic/Prosthetic subsequent, [phone]- Canalith repositioning, [phone]- Electrical stimulation (unattended), [phone]- Electrical stimulation (manual), [id number]- Vasopneumatic device, [id number]- Traction (mechanical), [id number]- Ionotophoresis 4mg /ml Dexamethasone , Patient/Family education, Balance training, Stair training, Taping, Dry Needling, Joint mobilization, Spinal mobilization, Vestibular training, Visual/preceptual remediation/compensation, DME instructions, Cryotherapy, Moist heat, and Biofeedback  PLAN FOR NEXT SESSION:  *Recheck orthostatic hypotension* - maybe re-assess positional tests if pt reporting additional bouts of vertigo; otherwise, feel pt likely experiencing imbalance vs lightheadedness - standing balance  - narrow BOS  - reaching  - foot taps   - eyes closed - dynamic gait training with use of rollator for safety or knee pain management as needed   Maxcine Strong, PT, DPT, NCS, CSRS Physical Therapist - [facility]  [facility]  10:19 AM 06/13/23

## 2023-06-17 ENCOUNTER — Emergency Department
Admission: EM | Admit: 2023-06-17 | Discharge: 2023-06-17 | Disposition: A | Discharge status: Home / Self Care | Attending: Emergency Medicine | Admitting: Emergency Medicine

## 2023-06-17 ENCOUNTER — Encounter: Payer: Self-pay | Admitting: Emergency Medicine

## 2023-06-17 ENCOUNTER — Emergency Department

## 2023-06-17 ENCOUNTER — Other Ambulatory Visit: Payer: Self-pay

## 2023-06-17 ENCOUNTER — Ambulatory Visit

## 2023-06-17 DIAGNOSIS — D649 Anemia, unspecified: Secondary | ICD-10-CM | POA: Diagnosis not present

## 2023-06-17 DIAGNOSIS — J449 Chronic obstructive pulmonary disease, unspecified: Secondary | ICD-10-CM | POA: Diagnosis not present

## 2023-06-17 DIAGNOSIS — I509 Heart failure, unspecified: Secondary | ICD-10-CM | POA: Diagnosis not present

## 2023-06-17 DIAGNOSIS — K5730 Diverticulosis of large intestine without perforation or abscess without bleeding: Secondary | ICD-10-CM | POA: Diagnosis not present

## 2023-06-17 DIAGNOSIS — R1032 Left lower quadrant pain: Secondary | ICD-10-CM | POA: Insufficient documentation

## 2023-06-17 DIAGNOSIS — E119 Type 2 diabetes mellitus without complications: Secondary | ICD-10-CM | POA: Insufficient documentation

## 2023-06-17 DIAGNOSIS — I110 Hypertensive heart disease with heart failure: Secondary | ICD-10-CM | POA: Diagnosis not present

## 2023-06-17 DIAGNOSIS — N3289 Other specified disorders of bladder: Secondary | ICD-10-CM | POA: Diagnosis not present

## 2023-06-17 LAB — COMPREHENSIVE METABOLIC PANEL WITH GFR
ALT: 18 U/L (ref 0–44)
AST: 22 U/L (ref 15–41)
Albumin: 3.4 g/dL — ABNORMAL LOW (ref 3.5–5.0)
Alkaline Phosphatase: 54 U/L (ref 38–126)
Anion gap: 9 (ref 5–15)
BUN: 13 mg/dL (ref 8–23)
CO2: 25 mmol/L (ref 22–32)
Calcium: 8.6 mg/dL — ABNORMAL LOW (ref 8.9–10.3)
Chloride: 103 mmol/L (ref 98–111)
Creatinine, Ser: 0.93 mg/dL (ref 0.61–1.24)
GFR, Estimated: >60 mL/min (ref >60)
Glucose, Bld: 173 mg/dL — ABNORMAL HIGH (ref 70–99)
Potassium: 4.4 mmol/L (ref 3.5–5.1)
Sodium: 137 mmol/L (ref 135–145)
Total Bilirubin: 0.6 mg/dL (ref 0.0–1.2)
Total Protein: 6.2 g/dL — ABNORMAL LOW (ref 6.5–8.1)

## 2023-06-17 LAB — CBC
HCT: 25.2 % — ABNORMAL LOW (ref 39.0–52.0)
Hemoglobin: 7.4 g/dL — ABNORMAL LOW (ref 13.0–17.0)
MCH: 22.8 pg — ABNORMAL LOW (ref 26.0–34.0)
MCHC: 29.4 g/dL — ABNORMAL LOW (ref 30.0–36.0)
MCV: 77.8 fL — ABNORMAL LOW (ref 80.0–100.0)
Platelets: 261 10*3/uL (ref 150–400)
RBC: 3.24 MIL/uL — ABNORMAL LOW (ref 4.22–5.81)
RDW: 21.8 % — ABNORMAL HIGH (ref 11.5–15.5)
WBC: 6 10*3/uL (ref 4.0–10.5)
nRBC: 0 % (ref 0.0–0.2)

## 2023-06-17 LAB — URINALYSIS, ROUTINE W REFLEX MICROSCOPIC
Bilirubin Urine: NEGATIVE
Glucose, UA: NEGATIVE mg/dL
Hgb urine dipstick: NEGATIVE
Ketones, ur: NEGATIVE mg/dL
Leukocytes,Ua: NEGATIVE
Nitrite: NEGATIVE
Protein, ur: NEGATIVE mg/dL
Specific Gravity, Urine: 1.006 (ref 1.005–1.030)
pH: 7 (ref 5.0–8.0)

## 2023-06-17 LAB — PROTIME-INR
INR: 2 — ABNORMAL HIGH (ref 0.8–1.2)
Prothrombin Time: 22.6 s — ABNORMAL HIGH (ref 11.4–15.2)

## 2023-06-17 LAB — LIPASE, BLOOD: Lipase: 33 U/L (ref 11–51)

## 2023-06-17 MED ORDER — IOHEXOL 300 MG/ML  SOLN
80.0000 mL | Freq: Once | INTRAMUSCULAR | Status: AC | PRN
  Administered 2023-06-17: 80 mL via INTRAVENOUS

## 2023-06-17 NOTE — ED Notes (Signed)
 EDT unable to obtain blood. Lab called for blood work

## 2023-06-17 NOTE — Therapy (Incomplete)
 OUTPATIENT PHYSICAL THERAPY TREATMENT   Patient Name: Taylor Austin MRN: [id number] DOB:December 13, 1940, 83 y.o., male Today's Date: 06/17/2023  PCP: Newell Bard PROVIDER: Devora Folks  END OF SESSION:      Past Medical History:  Diagnosis Date   Allergy    Anemia    Arthritis    Back pain    Barrett esophagus    BPH (benign prostatic hyperplasia)    CHF (congestive heart failure) (HCC)    Chronic kidney disease    COPD (chronic obstructive pulmonary disease) (HCC)    Coronary artery disease    Depression    Diabetes mellitus without complication (HCC)    Diverticulitis    Dysphagia    Esophageal reflux    Esophageal reflux    GERD (gastroesophageal reflux disease)    Heart murmur    Hyperlipidemia    Hypersomnia    Hypertension    Kidney stones    Low testosterone    OAB (overactive bladder)    Obesity    Orthostatic hypotension    Presence of dental bridge    2 - top   Rheumatic fever    Sleep apnea    BiPAP   Stroke (HCC) 01/2016   Vertigo    Vitamin B12 deficiency    Past Surgical History:  Procedure Laterality Date   BACK SURGERY     CARDIAC CATHETERIZATION  08/11/2014   Procedure: Left Heart Cath and Coronary Angiography;  Surgeon: Ronney Cola, MD;  Location: [facility];  Service: Cardiovascular;;   CARDIAC CATHETERIZATION N/A 11/17/2014   Procedure: Right Heart Cath;  Surgeon: Ronney Cola, MD;  Location: [facility];  Service: Cardiovascular;  Laterality: N/A;   CAROTID STENT     CATARACT EXTRACTION W/PHACO Left 04/03/2016   Procedure: CATARACT EXTRACTION PHACO AND INTRAOCULAR LENS PLACEMENT (IOC) left diabetic;  Surgeon: Rosa College, MD;  Location: [facility];  Service: Ophthalmology;  Laterality: Left;  diabetic - oral meds sleep apnea   CATARACT EXTRACTION W/PHACO Right 05/01/2016   Procedure: CATARACT EXTRACTION PHACO AND INTRAOCULAR LENS PLACEMENT (IOC) Right diabetic;  Surgeon: Rosa College, MD;  Location: [facility];  Service: Ophthalmology;  Laterality: Right;  Diabetic oral meds sleep apnea   CERVICAL SPINE SURGERY     COLONOSCOPY     COLONOSCOPY WITH PROPOFOL  N/A 06/10/2015   Procedure: COLONOSCOPY WITH PROPOFOL ;  Surgeon: Cassie Click, MD;  Location: [facility] ENDOSCOPY;  Service: Endoscopy;  Laterality: N/A;   COLONOSCOPY WITH PROPOFOL  N/A 02/28/2023   Procedure: COLONOSCOPY WITH PROPOFOL ;  Surgeon: Marnee Sink, MD;  Location: [facility] ENDOSCOPY;  Service: Endoscopy;  Laterality: N/A;   CORONARY ANGIOPLASTY     ESOPHAGOGASTRODUODENOSCOPY N/A 02/28/2023   Procedure: ESOPHAGOGASTRODUODENOSCOPY (EGD);  Surgeon: Marnee Sink, MD;  Location: [facility] ENDOSCOPY;  Service: Endoscopy;  Laterality: N/A;   ESOPHAGOGASTRODUODENOSCOPY N/A 06/06/2023   Procedure: EGD (ESOPHAGOGASTRODUODENOSCOPY);  Surgeon: Quintin Buckle, DO;  Location: [facility] ENDOSCOPY;  Service: Gastroenterology;  Laterality: N/A;   ESOPHAGOGASTRODUODENOSCOPY (EGD) WITH PROPOFOL  N/A 06/28/2017   Procedure: ESOPHAGOGASTRODUODENOSCOPY (EGD) WITH PROPOFOL ;  Surgeon: Cassie Click, MD;  Location: [facility] ENDOSCOPY;  Service: Endoscopy;  Laterality: N/A;   ESOPHAGOGASTRODUODENOSCOPY ENDOSCOPY     EYE SURGERY     LUMBAR SPINE SURGERY     POLYPECTOMY  02/28/2023   Procedure: POLYPECTOMY;  Surgeon: Marnee Sink, MD;  Location: [facility] ENDOSCOPY;  Service: Endoscopy;;   THORACIC SPINE SURGERY     TONSILLECTOMY  Patient Active Problem List   Diagnosis Date Noted   Polyp of descending colon 02/28/2023   Duodenal ulceration 02/28/2023   Symptomatic anemia 02/26/2023   Upper GI bleed 02/26/2023   Morbid obesity (HCC) 02/26/2023   History of ischemic stroke (07/23/2021) 12/11/2021   Stroke-like symptoms 09/09/2021   Chronic diastolic CHF (congestive heart failure) (HCC) 07/26/2021   Ischemic stroke (HCC) 07/23/2021   Acute ischemic stroke (HCC) 07/23/2021   Cerebral infarction, unspecified (HCC) 07/23/2021    Fasting hyperglycemia 07/18/2021   Orthostatic hypotension 07/18/2021   Lumbar facet syndrome 12/20/2020   Vertigo, Chronic 12/07/2020   Retrolisthesis of L2 over L3 12/07/2020   At high risk for falls 12/07/2020   Aortic atherosclerosis (HCC) 09/20/2020   Hypomagnesemia 07/27/2020   Spondylosis without myelopathy or radiculopathy, lumbosacral region 07/27/2020   Mixed Alzheimer's and vascular dementia (HCC) 06/17/2020   Chronic pain syndrome 06/15/2020   Pharmacologic therapy 06/15/2020   Disorder of skeletal system 06/15/2020   Problems influencing health status 06/15/2020   Abnormal MRI, cervical spine (11/14/2019) 06/15/2020   Abnormal MRI, lumbar spine (11/14/2019) 06/15/2020   History of lumbar laminectomy 06/15/2020   History of fusion of cervical spine 06/15/2020   DDD (degenerative disc disease), cervical 06/15/2020   DDD (degenerative disc disease), lumbosacral 06/15/2020   Lumbosacral facet arthropathy (Multilevel) (Bilateral) 06/15/2020   Cervical facet hypertrophy 06/15/2020   DJD (degenerative joint disease) of cervical spine 06/15/2020   Chronic anticoagulation (Plavix ) 06/15/2020   Lumbar facet joint pain (Bilateral) 06/15/2020   History of CVA (cerebrovascular accident) 06/15/2020   Poor historian 06/15/2020   Chronic low back pain (1ry area of Pain) (Bilateral) (R>L) w/o sciatica 12/15/2019   Lumbar stenosis with neurogenic claudication 12/15/2019   Memory loss, short term 10/09/2019   Chronic obstructive pulmonary disease (HCC) 09/18/2018   Morbid obesity with BMI of 40.0-44.9, adult (HCC) 04/03/2018   Hyperlipidemia associated with type 2 diabetes mellitus (HCC) 02/05/2018   Prostate cancer (HCC) 09/08/2017   Gastroesophageal reflux disease without esophagitis 07/29/2017   Other dysphagia 07/29/2017   Acute cerebrovascular accident (CVA) (HCC) 01/30/2016   Chronic kidney disease 07/14/2014   Clinical depression 07/14/2014   Gait instability 05/05/2014    Arthritis of knee 07/03/2013   Arteriosclerosis of coronary artery 07/03/2013   Type 2 diabetes mellitus with diabetic neuropathy, without long-term current use of insulin  (HCC) 07/03/2013   Benign essential HTN 07/03/2013   HLD (hyperlipidemia) 07/03/2013   Breath shortness 07/03/2013   Atherosclerotic heart disease of native coronary artery without angina pectoris 07/03/2013   Sleep apnea 06/18/2013   History of Barrett's esophagus 01/08/2001    ONSET DATE: on and off for years  REFERRING DIAG: imbalance  THERAPY DIAG:  No diagnosis found.  Rationale for Evaluation and Treatment: Rehabilitation  SUBJECTIVE:  SUBJECTIVE STATEMENT:  Cardiology MD visit on 06/12/2023   Pt states he is "doing good" except for his L>R knee pain rated as 5-6/10 when walking with rollator into clinic. Pt with bandages on his arm stating they are "kitty marks." Reports he felt good after last session. Pt reports "everything looked good" from his cardiologist. Pt states he has a loop recorder that sends updates to the MD.    Pt accompanied by: significant other (wife, Sharman Debar) in waiting room   PERTINENT HISTORY: Patient presents with referral for imbalance. He has additional PMH of dementia, CVA July and September 2023 ( L occipital lobe and L thalamus) and 2018, A fib,chronic pain due to neuropathy, HTN, DM, HLD, OSA, anemia, Barrett's esophagus, depression, prostate cancer, obesity.  Patient did have a fall in February 2025 where he got out of a car and fell forward. Patient has vertigo all the time per patient and his legs are giving out on him. Sees orthopedics about chronic DJD of knees, history of joint injections into his knees. Pt has extensive cardiopulmonary hx with Paroxysmal atrial fibrillation and COPD.   PAIN:   Are you having pain? Yes, bilat knee pain   PRECAUTIONS: Fall, has inserted loop recorder  RED FLAGS: None   WEIGHT BEARING RESTRICTIONS: No  FALLS: Has patient fallen in last 6 months? Yes. Number of falls 2  LIVING ENVIRONMENT: Lives with: lives with their spouse Lives in: House/apartment Stairs: 1 step onto the porch, and one onto the house; has a chair lift for inside the house Has following equipment at home: Walker - 4 wheeled, shower chair, and Grab bars  PLOF: Independent with basic ADLs  PATIENT GOALS: to conquer the vertigo and strengthen his BLE.   OBJECTIVE:  Note: Objective measures were completed at Evaluation unless otherwise noted.  DIAGNOSTIC FINDINGS:  Lower Extremity EMG 09/16/2018 - This is an abnormal electrodiagnostic study consistent with a 1) generalized sensory polyneuropathy. 2) Superimposed bilateral S1 radiculopathies.   MRI C-Spine 11/13/2019: Prior ACDF at C5-C7 without residual spinal stenosis. Persistent uncovertebral spurring with residual mild to moderate bilateral C6 and C7 foraminal stenosis as above. Mild disc bulging with uncovertebral and facet hypertrophy at C3-4 and C4-5 with resultant mild spinal stenosis but no cord deformity. Moderate left and mild right C4 and C5 foraminal narrowing. Moderate multilevel facet arthrosis throughout the cervical spine as above, which could contribute to underlying neck pain.   MRI L-Spine 11/13/2019: Multilevel degenerative changes of the lumbar spine as described above, progressed at L2-L3 with there is now moderate spinal canal stenosis. Unchanged moderate to severe spinal canal stenosis and severe Left neuroforaminal stenosis at L3-L4. Unchanged moderate bilateral neuro foraminal stenosis at L5-S1.   MRI Brain 09/09/21 1. Acute lacunar infarct in the right corona radiata.  2. Chronic small vessel ischemic disease with multiple chronic  infarcts as above.   COGNITION: Overall cognitive status: Impaired,  History of cognitive impairments - at baseline, and late onset dementia   SENSATION: WFL  COORDINATION: Heel slide test: WFL  POSTURE: rounded shoulders, forward head, and flexed trunk   LOWER EXTREMITY MMT:    MMT Right Eval Left Eval  Hip flexion 4 4  Hip extension    Hip abduction 4 4  Hip adduction 4 4  Hip internal rotation    Hip external rotation    Knee flexion 4- 4-  Knee extension 4- 4  Ankle dorsiflexion 4- 4-  Ankle plantarflexion 4- 4-  Ankle inversion  Ankle eversion    (Blank rows = not tested)  TRANSFERS: Sit to stand: CGA  Assistive device utilized: Environmental consultant - 4 wheeled     Stand to sit: CGA  Assistive device utilized: Environmental consultant - 4 wheeled     Chair to chair: CGA and Min A  Assistive device utilized: Environmental consultant - 4 wheeled       GAIT: Findings: Gait Characteristics: step through pattern, decreased stride length, shuffling, trunk flexed, poor foot clearance- Right, and poor foot clearance- Left, Distance walked: 60 ft, Assistive device utilized:Walker - 4 wheeled, Level of assistance: CGA, and Comments: increased knee flexion with fatigue  FUNCTIONAL TESTS:  5 times sit to stand: 13 seconds heavy  BUE support  10 meter walk test: 9 seconds with 4 [employer] Berg Balance Scale: 27  PATIENT SURVEYS:  None given                                                                                                                              TREATMENT DATE: 06/17/23  *Recheck orthostatic hypotension* - maybe re-assess positional tests if pt reporting additional bouts of vertigo; otherwise, feel pt likely experiencing imbalance vs lightheadedness - standing balance  - narrow BOS  - reaching  - foot taps   - eyes closed - dynamic gait training with use of rollator for safety or knee pain management as needed  PREVIOUS Pt ambulates into clinic using his rollator with supervision.  Dynamic gait training using rollator today due to knee pain, including:  ~193ft with  dual-task of looking R and L to identify number of fingers held up by therapist while having to navigate around gym equipment Another 160ft as described, but switched dual-task to using playing cards as visual target Requires close SBA throughout for safety while working on head turns to visually scan environment 62min30sec forward/backwards gait ~82ft each direction, using rollator, with external targets of Blaze Pods (6) set on random setting with 0.5 sec delay (3 on either side of the walkway for head turning and reaching challenges) CGA for safety Increased L knee pain with this The Blaze Pod Random setting was chosen to enhance cognitive processing and agility, providing an unpredictable environment to simulate real-world scenarios, and fostering quick reactions and adaptability.   Dynamic standing balance interventions including: Standing with intermittent UE support on rollator while having 6 Blaze Pods placed at counter height in a circle around patient to promote repetitive turning for dizziness habituation Reports dizziness 6/10 with it coming down to 4/10 after seated rest break CGA for safety/steadying  Patient continues to complain of "lightheaded" dizziness so performed vital sign assessment.  Orthostatic vitals:  Sitting after above activities: BP 168/70 (MAP 99), HR 75bpm, SpO2 98% with pt reporting he feels "lightheaded" like he is "in a fog" Standing: BP 113/94 (MAP 99), HR 96bpm with pt reporting "a little lightheadedness"; states he is always lightheaded when he is up Sitting: BP 131/89 (MAP 99), HR  82bpm  Pt reports he hasn't eaten anything yet this morning because he is planning to eat after   Educated pt on change in his BP from sitting>standing and concern for possible orthostatic hypotension. Wrote down his vitals and provided them to his wife and educated her as well since pt reporting she helps him remember things.  Reinforced education to pt on differentiating  vertigo vs lightheadedness vs imbalance rather than always using the word "dizziness" to help therapist better differentiate appropriate interventions; however, pt with decreased carryover due to cognitive decline.    PATIENT EDUCATION: Education details: goals, POC Person educated: Patient and Spouse Education method: Explanation, Demonstration, Tactile cues, and Verbal cues Education comprehension: verbalized understanding, returned demonstration, verbal cues required, tactile cues required, and needs further education  HOME EXERCISE PROGRAM: Access Code: [email or web address] URL: [email or web address] Date: 06/05/2023 Prepared by: Atlee Blanks  Exercises - Standing March with Counter Support  - 1 x daily - 7 x weekly - 3 sets - 20 reps - Side Stepping with Counter Support  - 1 x daily - 7 x weekly - 3 sets - 20 reps  GOALS: Goals reviewed with patient? Yes  SHORT TERM GOALS: Target date: 06/11/2023 Patient will be independent in home exercise program to improve strength/mobility for better functional independence with ADLs. Goal status: INITIAL  LONG TERM GOALS: Target date: 08/06/2023 Patient will reduce dizziness handicap inventory score to <50, for less dizziness with ADLs and increased safety with home and work tasks.  Baseline: 05/21/2023:  42/moderate handicap Goal status: INITIAL  2.  Patient will increase Berg Balance score by > 6 points to demonstrate decreased fall risk during functional activities. Baseline: 5/6: 27 Goal status: INITIAL  3.  Patient will increase BLE gross strength to 4+/5 as to improve functional strength for independent gait, increased standing tolerance and increased ADL ability. Baseline: see above Goal status: INITIAL  4.  Patient will be able to perform five time sit to stand without UE support in <15 seconds to demonstrate improved LE strength and improved mobility at home and in the community.  Baseline: heavy BUE support 13 seconds Goal  status: INITIAL  ASSESSMENT:  CLINICAL IMPRESSION:  Therapy session focused on dynamic gait training using AD due to reports of increased knee pain today as well as dynamic standing balance with repetitive turning for dizziness habituation. Therapist reinforces education to pt on importance of differentiating between vertigo vs lightheadedness vs imbalance to help therapist with appropriate interventions for his symptoms. Patient continues to describe "lightheadedness" feeling so performed BP assessment sit>stand with pt demonstrating significant drop in his systolic BP indicative of possible orthostatic hypotension. Educated both pt and wife on this finding and recommendation to follow-up with cardiologist. Pt will continue to benefit from skilled physical therapy to address remaining deficits in order to improve overall QOL, decrease fall risk, decrease dizziness symptoms. and return to PLOF.    OBJECTIVE IMPAIRMENTS: Abnormal gait, cardiopulmonary status limiting activity, decreased activity tolerance, decreased balance, decreased cognition, decreased endurance, decreased knowledge of condition, decreased mobility, difficulty walking, decreased strength, impaired perceived functional ability, improper body mechanics, postural dysfunction, obesity, and pain.   ACTIVITY LIMITATIONS: carrying, lifting, bending, standing, squatting, stairs, transfers, bed mobility, toileting, locomotion level, and caring for others  PARTICIPATION LIMITATIONS: meal prep, cleaning, laundry, driving, shopping, community activity, and yard work  PERSONAL FACTORS: Age, Fitness, Past/current experiences, Time since onset of injury/illness/exacerbation, Transportation, and 3+ comorbidities: dementia, CVA July and September 2023( L occipital lobe and L thalamus) and  2018, A fib,chronic pain due to neuropathy, HTN, DM, HLD, OSA, anemia, Barrett's esophagus, depression, prostate cancer, obesity are also affecting patient's  functional outcome.   REHAB POTENTIAL: Good  CLINICAL DECISION MAKING: Evolving/moderate complexity  EVALUATION COMPLEXITY: Moderate  PLAN:  PT FREQUENCY: 2x/week  PT DURATION: 12 weeks  PLANNED INTERVENTIONS: 97164- PT Re-evaluation, 97750- Physical Performance Testing, 97110-Therapeutic exercises, 97530- Therapeutic activity, [id number]- Neuromuscular re-education, 97535- Self Care, 16109- Manual therapy, [id number]- Gait training, [phone]- Orthotic Initial, [phone]- Orthotic/Prosthetic subsequent, [phone]- Canalith repositioning, [id number]- Electrical stimulation (unattended), [phone]- Electrical stimulation (manual), [id number]- Vasopneumatic device, [id number]- Traction (mechanical), [id number]- Ionotophoresis 4mg /ml Dexamethasone , Patient/Family education, Balance training, Stair training, Taping, Dry Needling, Joint mobilization, Spinal mobilization, Vestibular training, Visual/preceptual remediation/compensation, DME instructions, Cryotherapy, Moist heat, and Biofeedback  PLAN FOR NEXT SESSION:  *Recheck orthostatic hypotension* - maybe re-assess positional tests if pt reporting additional bouts of vertigo; otherwise, feel pt likely experiencing imbalance vs lightheadedness - standing balance  - narrow BOS  - reaching  - foot taps   - eyes closed - dynamic gait training with use of rollator for safety or knee pain management as needed   Carly Pippin, PT, DPT, NCS, CSRS Physical Therapist - [employer] [facility]  [facility]  8:24 AM 06/17/23

## 2023-06-17 NOTE — ED Triage Notes (Signed)
 Pt to ED via POV. Pt c/o LLQ abdominal pain x 3-4 days. Pt states that the pain is a sharp pain and gets worse when he is moving around. Pt denies any other GI symptoms at this time. Pt is in NAD.

## 2023-06-17 NOTE — ED Provider Notes (Signed)
 [facility] Provider Note    Event Date/Time   First MD Initiated Contact with Patient 06/17/23 0915     (approximate)  History   Chief Complaint: Abdominal Pain  HPI  Taylor Austin is a 83 y.o. male with a past medical history of anemia, CHF, COPD, diabetes, gastric reflux, hypertension, hyperlipidemia, presents to the emergency department for left lower quadrant abdominal pain.  According to the patient over the past 4 days he has been experiencing some pain in the left lower quadrant.  Patient has a past history of kidney stones as well as diverticulitis to which she states they both feel somewhat similar.  Patient also has a history of a recent GI bleed back in February requiring multiple units of blood.  Patient had a colonoscopy and endoscopy done at that time.  They states since his blood counts have been low but he has not required any further transfusions.  Patient denies any black or bloody stool recently.  Denies any dysuria or hematuria.  No fever.  Physical Exam   Triage Vital Signs: ED Triage Vitals  Encounter Vitals Group     BP 06/17/23 0855 (!) 141/55     Systolic BP Percentile --      Diastolic BP Percentile --      Pulse Rate 06/17/23 0855 83     Resp 06/17/23 0855 16     Temp 06/17/23 0855 98.6 F (37 C)     Temp Source 06/17/23 0855 Oral     SpO2 06/17/23 0855 98 %     Weight 06/17/23 0856 260 lb (117.9 kg)     Height 06/17/23 0856 5\' 5"  (1.651 m)     Head Circumference --      Peak Flow --      Pain Score 06/17/23 0856 3     Pain Loc --      Pain Education --      Exclude from Growth Chart --     Most recent vital signs: Vitals:   06/17/23 0855  BP: (!) 141/55  Pulse: 83  Resp: 16  Temp: 98.6 F (37 C)  SpO2: 98%    General: Awake, no distress.  CV:  Good peripheral perfusion.  Regular rate and rhythm  Resp:  Normal effort.  Equal breath sounds bilaterally.  Abd:  No distention.  Soft, mild left lower quadrant  tenderness to palpation.  Otherwise largely benign abdomen.  No rebound or guarding.  ED Results / Procedures / Treatments   RADIOLOGY  I reviewed interpreted CT images.  I do not appreciate any significant abnormality or bowel obstruction on my evaluation. Radiology is read the CT scan as largely negative besides some mild bladder wall thickening that is stable.   MEDICATIONS ORDERED IN ED: Medications - No data to display   IMPRESSION / MDM / ASSESSMENT AND PLAN / ED COURSE  I reviewed the triage vital signs and the nursing notes.  Patient's presentation is most consistent with acute presentation with potential threat to life or bodily function.  Patient presents to the emergency department for left lower quadrant abdominal pain ongoing over the last 4 days.  Overall patient appears well does have mild tenderness to palpation in the left lower quadrant differential is quite broad but would include diverticulitis, colitis, ureterolithiasis, UTI pyelonephritis among other intra-abdominal pathology.  Will check labs including CBC chemistry lipase and urine.  Will obtain a CT scan preferably with contrast as long as the patient's  creatinine is able to tolerate.  Patient CBC is resulted showing a hemoglobin of 7.4 although significant this is largely unchanged over the last 3 months.  Remainder the lab work is pending.  Workup is reassuring.  CT scan shows no concerning findings.  Urinalysis is normal.  Patient states his symptoms started while playing golf, highly suspect patient could be experiencing musculoskeletal pain or a strained/torn muscle.  Given the otherwise reassuring workup I believe the patient is safe for discharge home with outpatient follow-up.  Patient agreeable to plan of care.  FINAL CLINICAL IMPRESSION(S) / ED DIAGNOSES   Left lower quadrant abdominal pain    Note:  This document was prepared using Dragon voice recognition software and may include unintentional  dictation errors.   Ruth Cove, MD 06/17/23 1350

## 2023-06-19 ENCOUNTER — Ambulatory Visit

## 2023-06-19 DIAGNOSIS — M6281 Muscle weakness (generalized): Secondary | ICD-10-CM | POA: Diagnosis not present

## 2023-06-19 DIAGNOSIS — M542 Cervicalgia: Secondary | ICD-10-CM | POA: Diagnosis not present

## 2023-06-19 DIAGNOSIS — R262 Difficulty in walking, not elsewhere classified: Secondary | ICD-10-CM

## 2023-06-19 DIAGNOSIS — R42 Dizziness and giddiness: Secondary | ICD-10-CM | POA: Diagnosis not present

## 2023-06-19 DIAGNOSIS — R2681 Unsteadiness on feet: Secondary | ICD-10-CM

## 2023-06-19 NOTE — Therapy (Signed)
 OUTPATIENT PHYSICAL THERAPY TREATMENT   Patient Name: Taylor Austin MRN: [id number] DOB:June 16, 1940, 83 y.o., male Today's Date: 06/19/2023  PCP: Newell Bard PROVIDER: Devora Folks  END OF SESSION:   PT End of Session - 06/19/23 0932     Visit Number 7    Number of Visits 24    Date for PT Re-Evaluation 08/06/23    Equipment Utilized During Treatment Gait belt    Activity Tolerance Patient tolerated treatment well    Behavior During Therapy [facility] for tasks assessed/performed               Past Medical History:  Diagnosis Date   Allergy    Anemia    Arthritis    Back pain    Barrett esophagus    BPH (benign prostatic hyperplasia)    CHF (congestive heart failure) (HCC)    Chronic kidney disease    COPD (chronic obstructive pulmonary disease) (HCC)    Coronary artery disease    Depression    Diabetes mellitus without complication (HCC)    Diverticulitis    Dysphagia    Esophageal reflux    Esophageal reflux    GERD (gastroesophageal reflux disease)    Heart murmur    Hyperlipidemia    Hypersomnia    Hypertension    Kidney stones    Low testosterone    OAB (overactive bladder)    Obesity    Orthostatic hypotension    Presence of dental bridge    2 - top   Rheumatic fever    Sleep apnea    BiPAP   Stroke (HCC) 01/2016   Vertigo    Vitamin B12 deficiency    Past Surgical History:  Procedure Laterality Date   BACK SURGERY     CARDIAC CATHETERIZATION  08/11/2014   Procedure: Left Heart Cath and Coronary Angiography;  Surgeon: Ronney Cola, MD;  Location: [facility] INVASIVE CV LAB;  Service: Cardiovascular;;   CARDIAC CATHETERIZATION N/A 11/17/2014   Procedure: Right Heart Cath;  Surgeon: Ronney Cola, MD;  Location: [facility] INVASIVE CV LAB;  Service: Cardiovascular;  Laterality: N/A;   CAROTID STENT     CATARACT EXTRACTION W/PHACO Left 04/03/2016   Procedure: CATARACT EXTRACTION PHACO AND INTRAOCULAR LENS PLACEMENT (IOC) left diabetic;   Surgeon: Rosa College, MD;  Location: [facility];  Service: Ophthalmology;  Laterality: Left;  diabetic - oral meds sleep apnea   CATARACT EXTRACTION W/PHACO Right 05/01/2016   Procedure: CATARACT EXTRACTION PHACO AND INTRAOCULAR LENS PLACEMENT (IOC) Right diabetic;  Surgeon: Rosa College, MD;  Location: [facility];  Service: Ophthalmology;  Laterality: Right;  Diabetic oral meds sleep apnea   CERVICAL SPINE SURGERY     COLONOSCOPY     COLONOSCOPY WITH PROPOFOL  N/A 06/10/2015   Procedure: COLONOSCOPY WITH PROPOFOL ;  Surgeon: Cassie Click, MD;  Location: [facility] ENDOSCOPY;  Service: Endoscopy;  Laterality: N/A;   COLONOSCOPY WITH PROPOFOL  N/A 02/28/2023   Procedure: COLONOSCOPY WITH PROPOFOL ;  Surgeon: Marnee Sink, MD;  Location: [facility] ENDOSCOPY;  Service: Endoscopy;  Laterality: N/A;   CORONARY ANGIOPLASTY     ESOPHAGOGASTRODUODENOSCOPY N/A 02/28/2023   Procedure: ESOPHAGOGASTRODUODENOSCOPY (EGD);  Surgeon: Marnee Sink, MD;  Location: [facility] ENDOSCOPY;  Service: Endoscopy;  Laterality: N/A;   ESOPHAGOGASTRODUODENOSCOPY N/A 06/06/2023   Procedure: EGD (ESOPHAGOGASTRODUODENOSCOPY);  Surgeon: Quintin Buckle, DO;  Location: [facility] ENDOSCOPY;  Service: Gastroenterology;  Laterality: N/A;   ESOPHAGOGASTRODUODENOSCOPY (EGD) WITH PROPOFOL  N/A 06/28/2017   Procedure: ESOPHAGOGASTRODUODENOSCOPY (EGD) WITH PROPOFOL ;  Surgeon:  Cassie Click, MD;  Location: [facility] ENDOSCOPY;  Service: Endoscopy;  Laterality: N/A;   ESOPHAGOGASTRODUODENOSCOPY ENDOSCOPY     EYE SURGERY     LUMBAR SPINE SURGERY     POLYPECTOMY  02/28/2023   Procedure: POLYPECTOMY;  Surgeon: Marnee Sink, MD;  Location: [facility] ENDOSCOPY;  Service: Endoscopy;;   THORACIC SPINE SURGERY     TONSILLECTOMY     Patient Active Problem List   Diagnosis Date Noted   Polyp of descending colon 02/28/2023   Duodenal ulceration 02/28/2023   Symptomatic anemia 02/26/2023   Upper GI bleed 02/26/2023   Morbid obesity (HCC)  02/26/2023   History of ischemic stroke (07/23/2021) 12/11/2021   Stroke-like symptoms 09/09/2021   Chronic diastolic CHF (congestive heart failure) (HCC) 07/26/2021   Ischemic stroke (HCC) 07/23/2021   Acute ischemic stroke (HCC) 07/23/2021   Cerebral infarction, unspecified (HCC) 07/23/2021   Fasting hyperglycemia 07/18/2021   Orthostatic hypotension 07/18/2021   Lumbar facet syndrome 12/20/2020   Vertigo, Chronic 12/07/2020   Retrolisthesis of L2 over L3 12/07/2020   At high risk for falls 12/07/2020   Aortic atherosclerosis (HCC) 09/20/2020   Hypomagnesemia 07/27/2020   Spondylosis without myelopathy or radiculopathy, lumbosacral region 07/27/2020   Mixed Alzheimer's and vascular dementia (HCC) 06/17/2020   Chronic pain syndrome 06/15/2020   Pharmacologic therapy 06/15/2020   Disorder of skeletal system 06/15/2020   Problems influencing health status 06/15/2020   Abnormal MRI, cervical spine (11/14/2019) 06/15/2020   Abnormal MRI, lumbar spine (11/14/2019) 06/15/2020   History of lumbar laminectomy 06/15/2020   History of fusion of cervical spine 06/15/2020   DDD (degenerative disc disease), cervical 06/15/2020   DDD (degenerative disc disease), lumbosacral 06/15/2020   Lumbosacral facet arthropathy (Multilevel) (Bilateral) 06/15/2020   Cervical facet hypertrophy 06/15/2020   DJD (degenerative joint disease) of cervical spine 06/15/2020   Chronic anticoagulation (Plavix ) 06/15/2020   Lumbar facet joint pain (Bilateral) 06/15/2020   History of CVA (cerebrovascular accident) 06/15/2020   Poor historian 06/15/2020   Chronic low back pain (1ry area of Pain) (Bilateral) (R>L) w/o sciatica 12/15/2019   Lumbar stenosis with neurogenic claudication 12/15/2019   Memory loss, short term 10/09/2019   Chronic obstructive pulmonary disease (HCC) 09/18/2018   Morbid obesity with BMI of 40.0-44.9, adult (HCC) 04/03/2018   Hyperlipidemia associated with type 2 diabetes mellitus (HCC)  02/05/2018   Prostate cancer (HCC) 09/08/2017   Gastroesophageal reflux disease without esophagitis 07/29/2017   Other dysphagia 07/29/2017   Acute cerebrovascular accident (CVA) (HCC) 01/30/2016   Chronic kidney disease 07/14/2014   Clinical depression 07/14/2014   Gait instability 05/05/2014   Arthritis of knee 07/03/2013   Arteriosclerosis of coronary artery 07/03/2013   Type 2 diabetes mellitus with diabetic neuropathy, without long-term current use of insulin  (HCC) 07/03/2013   Benign essential HTN 07/03/2013   HLD (hyperlipidemia) 07/03/2013   Breath shortness 07/03/2013   Atherosclerotic heart disease of native coronary artery without angina pectoris 07/03/2013   Sleep apnea 06/18/2013   History of Barrett's esophagus 01/08/2001    ONSET DATE: on and off for years  REFERRING DIAG: imbalance  THERAPY DIAG:  No diagnosis found.  Rationale for Evaluation and Treatment: Rehabilitation  SUBJECTIVE:  SUBJECTIVE STATEMENT: Pt seen in ED for L lower quadrant pain 06/10/23 and reports found to have likely mm strain, said it's gotten better since but still present. Pt reports bilat knee pain ( he reports it isn't bad, ongoing issue), but knees feel weak. Pt reports dizziness still present, says spinning has improved though. Pt reports limited walking endurance.   Pt accompanied by: self (wife, Sharman Debar) in waiting room   PERTINENT HISTORY: Patient presents with referral for imbalance. He has additional PMH of dementia, CVA July and September 2023 ( L occipital lobe and L thalamus) and 2018, A fib,chronic pain due to neuropathy, HTN, DM, HLD, OSA, anemia, Barrett's esophagus, depression, prostate cancer, obesity.  Patient did have a fall in February 2025 where he got out of a car and fell forward. Patient  has vertigo all the time per patient and his legs are giving out on him. Sees orthopedics about chronic DJD of knees, history of joint injections into his knees. Pt has extensive cardiopulmonary hx with Paroxysmal atrial fibrillation and COPD.   PAIN:  Are you having pain? Yes, bilat knee pain   PRECAUTIONS: Fall, has inserted loop recorder  RED FLAGS: None   WEIGHT BEARING RESTRICTIONS: No  FALLS: Has patient fallen in last 6 months? Yes. Number of falls 2  LIVING ENVIRONMENT: Lives with: lives with their spouse Lives in: House/apartment Stairs: 1 step onto the porch, and one onto the house; has a chair lift for inside the house Has following equipment at home: Walker - 4 wheeled, shower chair, and Grab bars  PLOF: Independent with basic ADLs  PATIENT GOALS: to conquer the vertigo and strengthen his BLE.   OBJECTIVE:  Note: Objective measures were completed at Evaluation unless otherwise noted.  DIAGNOSTIC FINDINGS:  Lower Extremity EMG 09/16/2018 - This is an abnormal electrodiagnostic study consistent with a 1) generalized sensory polyneuropathy. 2) Superimposed bilateral S1 radiculopathies.   MRI C-Spine 11/13/2019: Prior ACDF at C5-C7 without residual spinal stenosis. Persistent uncovertebral spurring with residual mild to moderate bilateral C6 and C7 foraminal stenosis as above. Mild disc bulging with uncovertebral and facet hypertrophy at C3-4 and C4-5 with resultant mild spinal stenosis but no cord deformity. Moderate left and mild right C4 and C5 foraminal narrowing. Moderate multilevel facet arthrosis throughout the cervical spine as above, which could contribute to underlying neck pain.   MRI L-Spine 11/13/2019: Multilevel degenerative changes of the lumbar spine as described above, progressed at L2-L3 with there is now moderate spinal canal stenosis. Unchanged moderate to severe spinal canal stenosis and severe Left neuroforaminal stenosis at L3-L4. Unchanged moderate  bilateral neuro foraminal stenosis at L5-S1.   MRI Brain 09/09/21 1. Acute lacunar infarct in the right corona radiata.  2. Chronic small vessel ischemic disease with multiple chronic  infarcts as above.   COGNITION: Overall cognitive status: Impaired, History of cognitive impairments - at baseline, and late onset dementia   SENSATION: WFL  COORDINATION: Heel slide test: WFL  POSTURE: rounded shoulders, forward head, and flexed trunk   LOWER EXTREMITY MMT:    MMT Right Eval Left Eval  Hip flexion 4 4  Hip extension    Hip abduction 4 4  Hip adduction 4 4  Hip internal rotation    Hip external rotation    Knee flexion 4- 4-  Knee extension 4- 4  Ankle dorsiflexion 4- 4-  Ankle plantarflexion 4- 4-  Ankle inversion    Ankle eversion    (Blank rows = not tested)  TRANSFERS:  Sit to stand: CGA  Assistive device utilized: Environmental consultant - 4 wheeled     Stand to sit: CGA  Assistive device utilized: Environmental consultant - 4 wheeled     Chair to chair: CGA and Min A  Assistive device utilized: Environmental consultant - 4 wheeled       GAIT: Findings: Gait Characteristics: step through pattern, decreased stride length, shuffling, trunk flexed, poor foot clearance- Right, and poor foot clearance- Left, Distance walked: 60 ft, Assistive device utilized:Walker - 4 wheeled, Level of assistance: CGA, and Comments: increased knee flexion with fatigue  FUNCTIONAL TESTS:  5 times sit to stand: 13 seconds heavy  BUE support  10 meter walk test: 9 seconds with 4 [employer] Berg Balance Scale: 27  PATIENT SURVEYS:  None given                                                                                                                              TREATMENT DATE: 06/19/23  TE:  March 10x each LE - repeats with 2# aw each LE 10x each LE  LAQ 10x each LE - rates easy - repeats with 2# aw each LE 10x  - pt rates not too bad Seated adductor squeeze with pball 3x10 bilat LE  Seated heel/toe rockers 20x bilat    TA: Gait  for endurance/cardio training with 4WW, close CGA 1x296 ft, brief standing rest break. Challenging for pt.  -After performing other seated exercises pt repeats for one more lap of 148 ft with 4WW, very fatiguing STS 6x, 8x     PATIENT EDUCATION: Education details: Pt educated throughout session about proper posture and technique with exercises. Improved exercise technique, movement at target joints, use of target muscles after min to mod verbal, visual, tactile cues.  Person educated: Patient Education method: Explanation, Demonstration, Tactile cues, and Verbal cues Education comprehension: verbalized understanding, returned demonstration, verbal cues required, tactile cues required, and needs further education  HOME EXERCISE PROGRAM: Access Code: [email or web address] URL: [email or web address] Date: 06/05/2023 Prepared by: Atlee Blanks  Exercises - Standing March with Counter Support  - 1 x daily - 7 x weekly - 3 sets - 20 reps - Side Stepping with Counter Support  - 1 x daily - 7 x weekly - 3 sets - 20 reps  GOALS: Goals reviewed with patient? Yes  SHORT TERM GOALS: Target date: 06/11/2023 Patient will be independent in home exercise program to improve strength/mobility for better functional independence with ADLs. Goal status: INITIAL  LONG TERM GOALS: Target date: 08/06/2023 Patient will reduce dizziness handicap inventory score to <50, for less dizziness with ADLs and increased safety with home and work tasks.  Baseline: 05/21/2023:  42/moderate handicap Goal status: INITIAL  2.  Patient will increase Berg Balance score by > 6 points to demonstrate decreased fall risk during functional activities. Baseline: 5/6: 27 Goal status: INITIAL  3.  Patient will increase BLE gross strength to 4+/5 as to improve functional strength for independent  gait, increased standing tolerance and increased ADL ability. Baseline: see above Goal status: INITIAL  4.  Patient will be able to  perform five time sit to stand without UE support in <15 seconds to demonstrate improved LE strength and improved mobility at home and in the community.  Baseline: heavy BUE support 13 seconds Goal status: INITIAL  ASSESSMENT:  CLINICAL IMPRESSION:  Pt somewhat limited with interventions due to quick onset of fatigue/LE weakness and poor cardio endurance. I suspect this may also be impacting the severity of some of pt's dizzy symptoms. Will continue to emphasize strength & conditioning program in addition to VRT exercises. Interventions ended early to prevent excessive pt fatigue. Pt will continue to benefit from skilled physical therapy to address remaining deficits in order to improve overall QOL, decrease fall risk, decrease dizziness symptoms. and return to PLOF.    OBJECTIVE IMPAIRMENTS: Abnormal gait, cardiopulmonary status limiting activity, decreased activity tolerance, decreased balance, decreased cognition, decreased endurance, decreased knowledge of condition, decreased mobility, difficulty walking, decreased strength, impaired perceived functional ability, improper body mechanics, postural dysfunction, obesity, and pain.   ACTIVITY LIMITATIONS: carrying, lifting, bending, standing, squatting, stairs, transfers, bed mobility, toileting, locomotion level, and caring for others  PARTICIPATION LIMITATIONS: meal prep, cleaning, laundry, driving, shopping, community activity, and yard work  PERSONAL FACTORS: Age, Fitness, Past/current experiences, Time since onset of injury/illness/exacerbation, Transportation, and 3+ comorbidities: dementia, CVA July and September 2023( L occipital lobe and L thalamus) and 2018, A fib,chronic pain due to neuropathy, HTN, DM, HLD, OSA, anemia, Barrett's esophagus, depression, prostate cancer, obesity are also affecting patient's functional outcome.   REHAB POTENTIAL: Good  CLINICAL DECISION MAKING: Evolving/moderate complexity  EVALUATION COMPLEXITY:  Moderate  PLAN:  PT FREQUENCY: 2x/week  PT DURATION: 12 weeks  PLANNED INTERVENTIONS: 97164- PT Re-evaluation, 97750- Physical Performance Testing, 97110-Therapeutic exercises, 97530- Therapeutic activity, [id number]- Neuromuscular re-education, 97535- Self Care, 16109- Manual therapy, [id number]- Gait training, [phone]- Orthotic Initial, [phone]- Orthotic/Prosthetic subsequent, [phone]- Canalith repositioning, [id number]- Electrical stimulation (unattended), [phone]- Electrical stimulation (manual), [id number]- Vasopneumatic device, [id number]- Traction (mechanical), [id number]- Ionotophoresis 4mg /ml Dexamethasone , Patient/Family education, Balance training, Stair training, Taping, Dry Needling, Joint mobilization, Spinal mobilization, Vestibular training, Visual/preceptual remediation/compensation, DME instructions, Cryotherapy, Moist heat, and Biofeedback  PLAN FOR NEXT SESSION:  *Recheck orthostatic hypotension* - maybe re-assess positional tests if pt reporting additional bouts of vertigo; otherwise, feel pt likely experiencing imbalance vs lightheadedness - standing balance  - narrow BOS  - reaching  - foot taps   - eyes closed - dynamic gait training with use of rollator for safety or knee pain management as needed  Aminta Kales PT, DPT  Physical Therapist - [facility]  [facility]  9:32 AM 06/19/23

## 2023-06-21 ENCOUNTER — Ambulatory Visit

## 2023-06-24 ENCOUNTER — Ambulatory Visit

## 2023-06-24 DIAGNOSIS — M542 Cervicalgia: Secondary | ICD-10-CM | POA: Diagnosis not present

## 2023-06-24 DIAGNOSIS — R2681 Unsteadiness on feet: Secondary | ICD-10-CM | POA: Diagnosis not present

## 2023-06-24 DIAGNOSIS — M6281 Muscle weakness (generalized): Secondary | ICD-10-CM | POA: Diagnosis not present

## 2023-06-24 DIAGNOSIS — R262 Difficulty in walking, not elsewhere classified: Secondary | ICD-10-CM

## 2023-06-24 DIAGNOSIS — R42 Dizziness and giddiness: Secondary | ICD-10-CM

## 2023-06-24 NOTE — Therapy (Signed)
 OUTPATIENT PHYSICAL THERAPY TREATMENT   Patient Name: Taylor Austin MRN: [id number] DOB:[birth year], 83 y.o., male Today's Date: 06/24/2023  PCP: Newell Bard PROVIDER: Devora Folks  END OF SESSION:   PT End of Session - 06/24/23 1312     Visit Number 8    Number of Visits 24    Date for PT Re-Evaluation 08/06/23    PT Start Time 1319    PT Stop Time 1400    PT Time Calculation (min) 41 min    Equipment Utilized During Treatment Gait belt    Activity Tolerance Patient tolerated treatment well;Patient limited by fatigue    Behavior During Therapy WFL for tasks assessed/performed            Past Medical History:  Diagnosis Date   Allergy    Anemia    Arthritis    Back pain    Barrett esophagus    BPH (benign prostatic hyperplasia)    CHF (congestive heart failure) (HCC)    Chronic kidney disease    COPD (chronic obstructive pulmonary disease) (HCC)    Coronary artery disease    Depression    Diabetes mellitus without complication (HCC)    Diverticulitis    Dysphagia    Esophageal reflux    Esophageal reflux    GERD (gastroesophageal reflux disease)    Heart murmur    Hyperlipidemia    Hypersomnia    Hypertension    Kidney stones    Low testosterone    OAB (overactive bladder)    Obesity    Orthostatic hypotension    Presence of dental bridge    2 - top   Rheumatic fever    Sleep apnea    BiPAP   Stroke (HCC) 01/2016   Vertigo    Vitamin B12 deficiency    Past Surgical History:  Procedure Laterality Date   BACK SURGERY     CARDIAC CATHETERIZATION  08/11/2014   Procedure: Left Heart Cath and Coronary Angiography;  Surgeon: Ronney Cola, MD;  Location: [facility];  Service: Cardiovascular;;   CARDIAC CATHETERIZATION N/A 11/17/2014   Procedure: Right Heart Cath;  Surgeon: Ronney Cola, MD;  Location: [facility];  Service: Cardiovascular;  Laterality: N/A;   CAROTID STENT     CATARACT EXTRACTION W/PHACO Left  04/03/2016   Procedure: CATARACT EXTRACTION PHACO AND INTRAOCULAR LENS PLACEMENT (IOC) left diabetic;  Surgeon: Rosa College, MD;  Location: [facility];  Service: Ophthalmology;  Laterality: Left;  diabetic - oral meds sleep apnea   CATARACT EXTRACTION W/PHACO Right 05/01/2016   Procedure: CATARACT EXTRACTION PHACO AND INTRAOCULAR LENS PLACEMENT (IOC) Right diabetic;  Surgeon: Rosa College, MD;  Location: [facility];  Service: Ophthalmology;  Laterality: Right;  Diabetic oral meds sleep apnea   CERVICAL SPINE SURGERY     COLONOSCOPY     COLONOSCOPY WITH PROPOFOL  N/A 06/10/2015   Procedure: COLONOSCOPY WITH PROPOFOL ;  Surgeon: Cassie Click, MD;  Location: [facility];  Service: Endoscopy;  Laterality: N/A;   COLONOSCOPY WITH PROPOFOL  N/A 02/28/2023   Procedure: COLONOSCOPY WITH PROPOFOL ;  Surgeon: Marnee Sink, MD;  Location: [facility];  Service: Endoscopy;  Laterality: N/A;   CORONARY ANGIOPLASTY     ESOPHAGOGASTRODUODENOSCOPY N/A 02/28/2023   Procedure: ESOPHAGOGASTRODUODENOSCOPY (EGD);  Surgeon: Marnee Sink, MD;  Location: [facility] END[facility]COPY;  Service: Endoscopy;  Laterality: N/A;   ESOPHAGOGASTRODUODENOSCOPY N/A 06/06/2023   Procedure: EGD (ESOPHAGOGASTRODUODENOSCOPY);  Surgeon: Quintin Buckle, DO;  Location: [facility] ENDOSCOPY;  Service: Gastroenterology;  Laterality: N/A;   ESOPHAGOGASTRODUODENOSCOPY (EGD) WITH PROPOFOL  N/A 06/28/2017   Procedure: ESOPHAGOGASTRODUODENOSCOPY (EGD) WITH PROPOFOL ;  Surgeon: Cassie Click, MD;  Location: [facility] ENDOSCOPY;  Service: Endoscopy;  Laterality: N/A;   ESOPHAGOGASTRODUODENOSCOPY ENDOSCOPY     EYE SURGERY     LUMBAR SPINE SURGERY     POLYPECTOMY  02/28/2023   Procedure: POLYPECTOMY;  Surgeon: Marnee Sink, MD;  Location: [facility] ENDOSCOPY;  Service: Endoscopy;;   THORACIC SPINE SURGERY     TONSILLECTOMY     Patient Active Problem List   Diagnosis Date Noted   Polyp of descending colon 02/28/2023   Duodenal  ulceration 02/28/2023   Symptomatic anemia 02/26/2023   Upper GI bleed 02/26/2023   Morbid obesity (HCC) 02/26/2023   History of ischemic stroke (07/23/2021) 12/11/2021   Stroke-like symptoms 09/09/2021   Chronic diastolic CHF (congestive heart failure) (HCC) 07/26/2021   Ischemic stroke (HCC) 07/23/2021   Acute ischemic stroke (HCC) 07/23/2021   Cerebral infarction, unspecified (HCC) 07/23/2021   Fasting hyperglycemia 07/18/2021   Orthostatic hypotension 07/18/2021   Lumbar facet syndrome 12/20/2020   Vertigo, Chronic 12/07/2020   Retrolisthesis of L2 over L3 12/07/2020   At high risk for falls 12/07/2020   Aortic atherosclerosis (HCC) 09/20/2020   Hypomagnesemia 07/27/2020   Spondylosis without myelopathy or radiculopathy, lumbosacral region 07/27/2020   Mixed Alzheimer's and vascular dementia (HCC) 06/17/2020   Chronic pain syndrome 06/15/2020   Pharmacologic therapy 06/15/2020   Disorder of skeletal system 06/15/2020   Problems influencing health status 06/15/2020   Abnormal MRI, cervical spine (11/14/2019) 06/15/2020   Abnormal MRI, lumbar spine (11/14/2019) 06/15/2020   History of lumbar laminectomy 06/15/2020   History of fusion of cervical spine 06/15/2020   DDD (degenerative disc disease), cervical 06/15/2020   DDD (degenerative disc disease), lumbosacral 06/15/2020   Lumbosacral facet arthropathy (Multilevel) (Bilateral) 06/15/2020   Cervical facet hypertrophy 06/15/2020   DJD (degenerative joint disease) of cervical spine 06/15/2020   Chronic anticoagulation (Plavix ) 06/15/2020   Lumbar facet joint pain (Bilateral) 06/15/2020   History of CVA (cerebrovascular accident) 06/15/2020   Poor historian 06/15/2020   Chronic low back pain (1ry area of Pain) (Bilateral) (R>L) w/o sciatica 12/15/2019   Lumbar stenosis with neurogenic claudication 12/15/2019   Memory loss, short term 10/09/2019   Chronic obstructive pulmonary disease (HCC) 09/18/2018   Morbid obesity with BMI  of 40.0-44.9, adult (HCC) 04/03/2018   Hyperlipidemia associated with type 2 diabetes mellitus (HCC) 02/05/2018   Prostate cancer (HCC) 09/08/2017   Gastroesophageal reflux disease without esophagitis 07/29/2017   Other dysphagia 07/29/2017   Acute cerebrovascular accident (CVA) (HCC) 01/30/2016   Chronic kidney disease 07/14/2014   Clinical depression 07/14/2014   Gait instability 05/05/2014   Arthritis of knee 07/03/2013   Arteriosclerosis of coronary artery 07/03/2013   Type 2 diabetes mellitus with diabetic neuropathy, without long-term current use of insulin  (HCC) 07/03/2013   Benign essential HTN 07/03/2013   HLD (hyperlipidemia) 07/03/2013   Breath shortness 07/03/2013   Atherosclerotic heart disease of native coronary artery without angina pectoris 07/03/2013   Sleep apnea 06/18/2013   History of Barrett's esophagus 01/08/2001    ONSET DATE: on and off for years  REFERRING DIAG: imbalance  THERAPY DIAG:  Dizziness and giddiness  Difficulty in walking, not elsewhere classified  Muscle weakness (generalized)  Rationale for Evaluation and Treatment: Rehabilitation  SUBJECTIVE:  SUBJECTIVE STATEMENT: Pt reports L side pain still present but that it has gotten a little better since last seen.  Pt reports he has exercised a little bit, says probably not as much as he should. He reports no stumbles/falls.   Pt accompanied by: self (wife, Sharman Debar) in waiting room   PERTINENT HISTORY: Patient presents with referral for imbalance. He has additional PMH of dementia, CVA July and September 2023 ( L occipital lobe and L thalamus) and 2018, A fib,chronic pain due to neuropathy, HTN, DM, HLD, OSA, anemia, Barrett's esophagus, depression, prostate cancer, obesity.  Patient did have a fall in February  2025 where he got out of a car and fell forward. Patient has vertigo all the time per patient and his legs are giving out on him. Sees orthopedics about chronic DJD of knees, history of joint injections into his knees. Pt has extensive cardiopulmonary hx with Paroxysmal atrial fibrillation and COPD.   PAIN:  Are you having pain? Yes, bilat knee pain   PRECAUTIONS: Fall, has inserted loop recorder  RED FLAGS: None   WEIGHT BEARING RESTRICTIONS: No  FALLS: Has patient fallen in last 6 months? Yes. Number of falls 2  LIVING ENVIRONMENT: Lives with: lives with their spouse Lives in: House/apartment Stairs: 1 step onto the porch, and one onto the house; has a chair lift for inside the house Has following equipment at home: Walker - 4 wheeled, shower chair, and Grab bars  PLOF: Independent with basic ADLs  PATIENT GOALS: to conquer the vertigo and strengthen his BLE.   OBJECTIVE:  Note: Objective measures were completed at Evaluation unless otherwise noted.  DIAGNOSTIC FINDINGS:  Lower Extremity EMG 09/16/2018 - This is an abnormal electrodiagnostic study consistent with a 1) generalized sensory polyneuropathy. 2) Superimposed bilateral S1 radiculopathies.   MRI C-Spine 11/13/2019: Prior ACDF at C5-C7 without residual spinal stenosis. Persistent uncovertebral spurring with residual mild to moderate bilateral C6 and C7 foraminal stenosis as above. Mild disc bulging with uncovertebral and facet hypertrophy at C3-4 and C4-5 with resultant mild spinal stenosis but no cord deformity. Moderate left and mild right C4 and C5 foraminal narrowing. Moderate multilevel facet arthrosis throughout the cervical spine as above, which could contribute to underlying neck pain.   MRI L-Spine 11/13/2019: Multilevel degenerative changes of the lumbar spine as described above, progressed at L2-L3 with there is now moderate spinal canal stenosis. Unchanged moderate to severe spinal canal stenosis and severe Left  neuroforaminal stenosis at L3-L4. Unchanged moderate bilateral neuro foraminal stenosis at L5-S1.   MRI Brain 09/09/21 1. Acute lacunar infarct in the right corona radiata.  2. Chronic small vessel ischemic disease with multiple chronic  infarcts as above.   COGNITION: Overall cognitive status: Impaired, History of cognitive impairments - at baseline, and late onset dementia   SENSATION: WFL  COORDINATION: Heel slide test: WFL  POSTURE: rounded shoulders, forward head, and flexed trunk   LOWER EXTREMITY MMT:    MMT Right Eval Left Eval  Hip flexion 4 4  Hip extension    Hip abduction 4 4  Hip adduction 4 4  Hip internal rotation    Hip external rotation    Knee flexion 4- 4-  Knee extension 4- 4  Ankle dorsiflexion 4- 4-  Ankle plantarflexion 4- 4-  Ankle inversion    Ankle eversion    (Blank rows = not tested)  TRANSFERS: Sit to stand: CGA  Assistive device utilized: Environmental consultant - 4 wheeled     Stand to sit:  CGA  Assistive device utilized: Environmental consultant - 4 wheeled     Chair to chair: CGA and Min A  Assistive device utilized: Environmental consultant - 4 wheeled       GAIT: Findings: Gait Characteristics: step through pattern, decreased stride length, shuffling, trunk flexed, poor foot clearance- Right, and poor foot clearance- Left, Distance walked: 60 ft, Assistive device utilized:Walker - 4 wheeled, Level of assistance: CGA, and Comments: increased knee flexion with fatigue  FUNCTIONAL TESTS:  5 times sit to stand: 13 seconds heavy  BUE support  10 meter walk test: 9 seconds with 4 [employer] Berg Balance Scale: 27  PATIENT SURVEYS:  None given                                                                                                                              TREATMENT DATE: 06/24/23  TE:  2# aw donned each LE: -March 2x12 each LE - pt reports some lightheadedness  TA: BP taken (seated): 135/121 mmHg - pt denies any other symptoms such as headaches, but does still have lightheadedness  (not new symptom) Reassessed to confirm above in case of cuff error (seated): 146/59 mmHg HR 70 bpm Recheck after another minute (seated): 117/57 mmHg HR 72 bpm  TE: 2# aw donned each LE: -LAQ 2x12 each LE - rates easy -Seated heel/toe rockers 2x15 bilat   TA: Gait for endurance/cardio training with 4WW, close CGA 148 ft, limited by SOB/fatigue/L knee pain (chronic issue) Seated BP: 146/95 mmHg SpO2% 100%, HR 70 bpm Seated BP recheck after another minute: 138/78 mmHg HR 70 bpm  Rest breaks provided throughout to prevent excessive fatigue   With pt's permission reviewed BP response/findings in session with spouse, encouraged pt & his spouse to continue to monitor BP at home and also assess it after pt has been up an moving.       PATIENT EDUCATION: Education details: Pt educated throughout session about proper posture and technique with exercises. Improved exercise technique, movement at target joints, use of target muscles after min to mod verbal, visual, tactile cues.  Person educated: Patient Education method: Explanation, Demonstration, Tactile cues, and Verbal cues Education comprehension: verbalized understanding, returned demonstration, verbal cues required, tactile cues required, and needs further education  HOME EXERCISE PROGRAM: Access Code: [email or web address] URL: [email or web address] Date: 06/05/2023 Prepared by: Atlee Blanks  Exercises - Standing March with Counter Support  - 1 x daily - 7 x weekly - 3 sets - 20 reps - Side Stepping with Counter Support  - 1 x daily - 7 x weekly - 3 sets - 20 reps  GOALS: Goals reviewed with patient? Yes  SHORT TERM GOALS: Target date: 06/11/2023 Patient will be independent in home exercise program to improve strength/mobility for better functional independence with ADLs. Goal status: INITIAL  LONG TERM GOALS: Target date: 08/06/2023 Patient will reduce dizziness handicap inventory score to <50, for less dizziness with  ADLs and increased safety with home and  work tasks.  Baseline: 05/21/2023:  42/moderate handicap Goal status: INITIAL  2.  Patient will increase Berg Balance score by > 6 points to demonstrate decreased fall risk during functional activities. Baseline: 5/6: 27 Goal status: INITIAL  3.  Patient will increase BLE gross strength to 4+/5 as to improve functional strength for independent gait, increased standing tolerance and increased ADL ability. Baseline: see above Goal status: INITIAL  4.  Patient will be able to perform five time sit to stand without UE support in <15 seconds to demonstrate improved LE strength and improved mobility at home and in the community.  Baseline: heavy BUE support 13 seconds Goal status: INITIAL  ASSESSMENT:  CLINICAL IMPRESSION:  Pt slightly more limited by fatigue/SOB today. Rest breaks provided throughout & interventions modified to prevent excessive fatigue. Vitals and BP assessed at multiple points throughout visit, findings were variable, including some high diastolic readings (see above). With pt's permission, PT reviewed BP response/findings with spouse, encouraged pt & his spouse to continue to monitor BP at home and also assess it after pt has been up an moving. PT also encouraged them to continue to follow-up with his physician regarding BP.  Pt will continue to benefit from skilled physical therapy to address remaining deficits in order to improve overall QOL, decrease fall risk, decrease dizziness symptoms. and return to PLOF.    OBJECTIVE IMPAIRMENTS: Abnormal gait, cardiopulmonary status limiting activity, decreased activity tolerance, decreased balance, decreased cognition, decreased endurance, decreased knowledge of condition, decreased mobility, difficulty walking, decreased strength, impaired perceived functional ability, improper body mechanics, postural dysfunction, obesity, and pain.   ACTIVITY LIMITATIONS: carrying, lifting, bending, standing,  squatting, stairs, transfers, bed mobility, toileting, locomotion level, and caring for others  PARTICIPATION LIMITATIONS: meal prep, cleaning, laundry, driving, shopping, community activity, and yard work  PERSONAL FACTORS: Age, Fitness, Past/current experiences, Time since onset of injury/illness/exacerbation, Transportation, and 3+ comorbidities: dementia, CVA July and September 2023( L occipital lobe and L thalamus) and 2018, A fib,chronic pain due to neuropathy, HTN, DM, HLD, OSA, anemia, Barrett's esophagus, depression, prostate cancer, obesity are also affecting patient's functional outcome.   REHAB POTENTIAL: Good  CLINICAL DECISION MAKING: Evolving/moderate complexity  EVALUATION COMPLEXITY: Moderate  PLAN:  PT FREQUENCY: 2x/week  PT DURATION: 12 weeks  PLANNED INTERVENTIONS: 97164- PT Re-evaluation, 97750- Physical Performance Testing, 97110-Therapeutic exercises, 97530- Therapeutic activity, [id number]- Neuromuscular re-education, 97535- Self Care, 16109- Manual therapy, [id number]- Gait training, [id number]- Orthotic Initial, [id number]- Orthotic/Prosthetic subsequent, [phone]- Canalith repositioning, G9562- Electrical stimulation (unattended), [phone]- Electrical stimulation (manual), [id number]- Vasopneumatic device, [id number]- Traction (mechanical), [id number]- Ionotophoresis 4mg /ml Dexamethasone , Patient/Family education, Balance training, Stair training, Taping, Dry Needling, Joint mobilization, Spinal mobilization, Vestibular training, Visual/preceptual remediation/compensation, DME instructions, Cryotherapy, Moist heat, and Biofeedback  PLAN FOR NEXT SESSION:  *Recheck orthostatic hypotension* - maybe re-assess positional tests if pt reporting additional bouts of vertigo; otherwise, feel pt likely experiencing imbalance vs lightheadedness - standing balance  - narrow BOS  - reaching  - foot taps   - eyes closed - dynamic gait training with use of rollator for safety or knee pain management as  needed  *retest positional tests  Aminta Kales PT, DPT  Physical Therapist - [facility]  [facility]  4:31 PM 06/24/23

## 2023-06-26 ENCOUNTER — Ambulatory Visit

## 2023-06-26 DIAGNOSIS — M6281 Muscle weakness (generalized): Secondary | ICD-10-CM | POA: Diagnosis not present

## 2023-06-26 DIAGNOSIS — R2681 Unsteadiness on feet: Secondary | ICD-10-CM

## 2023-06-26 DIAGNOSIS — M542 Cervicalgia: Secondary | ICD-10-CM

## 2023-06-26 DIAGNOSIS — R42 Dizziness and giddiness: Secondary | ICD-10-CM | POA: Diagnosis not present

## 2023-06-26 DIAGNOSIS — R262 Difficulty in walking, not elsewhere classified: Secondary | ICD-10-CM

## 2023-06-26 NOTE — Therapy (Signed)
 OUTPATIENT PHYSICAL THERAPY TREATMENT   Patient Name: Taylor Austin MRN: [id number] DOB:[birth year], 83 y.o., male Today's Date: 06/26/2023  PCP: Newell Bard PROVIDER: Devora Folks  END OF SESSION:   PT End of Session - 06/26/23 0933     Visit Number 9    Number of Visits 24    Date for PT Re-Evaluation 08/06/23    PT Start Time 0936    PT Stop Time 1011    PT Time Calculation (min) 35 min    Equipment Utilized During Treatment Gait belt    Activity Tolerance Patient tolerated treatment well;Patient limited by fatigue    Behavior During Therapy WFL for tasks assessed/performed            Past Medical History:  Diagnosis Date   Allergy    Anemia    Arthritis    Back pain    Barrett esophagus    BPH (benign prostatic hyperplasia)    CHF (congestive heart failure) (HCC)    Chronic kidney disease    COPD (chronic obstructive pulmonary disease) (HCC)    Coronary artery disease    Depression    Diabetes mellitus without complication (HCC)    Diverticulitis    Dysphagia    Esophageal reflux    Esophageal reflux    GERD (gastroesophageal reflux disease)    Heart murmur    Hyperlipidemia    Hypersomnia    Hypertension    Kidney stones    Low testosterone    OAB (overactive bladder)    Obesity    Orthostatic hypotension    Presence of dental bridge    2 - top   Rheumatic fever    Sleep apnea    BiPAP   Stroke (HCC) 01/2016   Vertigo    Vitamin B12 deficiency    Past Surgical History:  Procedure Laterality Date   BACK SURGERY     CARDIAC CATHETERIZATION  08/11/2014   Procedure: Left Heart Cath and Coronary Angiography;  Surgeon: Ronney Cola, MD;  Location: [facility];  Service: Cardiovascular;;   CARDIAC CATHETERIZATION N/A 11/17/2014   Procedure: Right Heart Cath;  Surgeon: Ronney Cola, MD;  Location: [facility];  Service: Cardiovascular;  Laterality: N/A;   CAROTID STENT     CATARACT EXTRACTION W/PHACO Left  04/03/2016   Procedure: CATARACT EXTRACTION PHACO AND INTRAOCULAR LENS PLACEMENT (IOC) left diabetic;  Surgeon: Rosa College, MD;  Location: [facility];  Service: Ophthalmology;  Laterality: Left;  diabetic - oral meds sleep apnea   CATARACT EXTRACTION W/PHACO Right 05/01/2016   Procedure: CATARACT EXTRACTION PHACO AND INTRAOCULAR LENS PLACEMENT (IOC) Right diabetic;  Surgeon: Rosa College, MD;  Location: [facility];  Service: Ophthalmology;  Laterality: Right;  Diabetic oral meds sleep apnea   CERVICAL SPINE SURGERY     COLONOSCOPY     COLONOSCOPY WITH PROPOFOL  N/A 06/10/2015   Procedure: COLONOSCOPY WITH PROPOFOL ;  Surgeon: Cassie Click, MD;  Location: [facility];  Service: Endoscopy;  Laterality: N/A;   COLONOSCOPY WITH PROPOFOL  N/A 02/28/2023   Procedure: COLONOSCOPY WITH PROPOFOL ;  Surgeon: Marnee Sink, MD;  Location: [facility] ENDOSCOPY;  Service: Endoscopy;  Laterality: N/A;   CORONARY ANGIOPLASTY     ESOPHAGOGASTRODUODENOSCOPY N/A 02/28/2023   Procedure: ESOPHAGOGASTRODUODENOSCOPY (EGD);  Surgeon: Marnee Sink, MD;  Location: [facility] ENDOSCOPY;  Service: Endoscopy;  Laterality: N/A;   ESOPHAGOGASTRODUODENOSCOPY N/A 06/06/2023   Procedure: EGD (ESOPHAGOGASTRODUODENOSCOPY);  Surgeon: Quintin Buckle, DO;  Location: [facility] ENDOSCOPY;  Service: Gastroenterology;  Laterality: N/A;   ESOPHAGOGASTRODUODENOSCOPY (EGD) WITH PROPOFOL  N/A 06/28/2017   Procedure: ESOPHAGOGASTRODUODENOSCOPY (EGD) WITH PROPOFOL ;  Surgeon: Cassie Click, MD;  Location: [facility] ENDOSCOPY;  Service: Endoscopy;  Laterality: N/A;   ESOPHAGOGASTRODUODENOSCOPY ENDOSCOPY     EYE SURGERY     LUMBAR SPINE SURGERY     POLYPECTOMY  02/28/2023   Procedure: POLYPECTOMY;  Surgeon: Marnee Sink, MD;  Location: [facility] ENDOSCOPY;  Service: Endoscopy;;   THORACIC SPINE SURGERY     TONSILLECTOMY     Patient Active Problem List   Diagnosis Date Noted   Polyp of descending colon 02/28/2023   Duodenal  ulceration 02/28/2023   Symptomatic anemia 02/26/2023   Upper GI bleed 02/26/2023   Morbid obesity (HCC) 02/26/2023   History of ischemic stroke (07/23/2021) 12/11/2021   Stroke-like symptoms 09/09/2021   Chronic diastolic CHF (congestive heart failure) (HCC) 07/26/2021   Ischemic stroke (HCC) 07/23/2021   Acute ischemic stroke (HCC) 07/23/2021   Cerebral infarction, unspecified (HCC) 07/23/2021   Fasting hyperglycemia 07/18/2021   Orthostatic hypotension 07/18/2021   Lumbar facet syndrome 12/20/2020   Vertigo, Chronic 12/07/2020   Retrolisthesis of L2 over L3 12/07/2020   At high risk for falls 12/07/2020   Aortic atherosclerosis (HCC) 09/20/2020   Hypomagnesemia 07/27/2020   Spondylosis without myelopathy or radiculopathy, lumbosacral region 07/27/2020   Mixed Alzheimer's and vascular dementia (HCC) 06/17/2020   Chronic pain syndrome 06/15/2020   Pharmacologic therapy 06/15/2020   Disorder of skeletal system 06/15/2020   Problems influencing health status 06/15/2020   Abnormal MRI, cervical spine (11/14/2019) 06/15/2020   Abnormal MRI, lumbar spine (11/14/2019) 06/15/2020   History of lumbar laminectomy 06/15/2020   History of fusion of cervical spine 06/15/2020   DDD (degenerative disc disease), cervical 06/15/2020   DDD (degenerative disc disease), lumbosacral 06/15/2020   Lumbosacral facet arthropathy (Multilevel) (Bilateral) 06/15/2020   Cervical facet hypertrophy 06/15/2020   DJD (degenerative joint disease) of cervical spine 06/15/2020   Chronic anticoagulation (Plavix ) 06/15/2020   Lumbar facet joint pain (Bilateral) 06/15/2020   History of CVA (cerebrovascular accident) 06/15/2020   Poor historian 06/15/2020   Chronic low back pain (1ry area of Pain) (Bilateral) (R>L) w/o sciatica 12/15/2019   Lumbar stenosis with neurogenic claudication 12/15/2019   Memory loss, short term 10/09/2019   Chronic obstructive pulmonary disease (HCC) 09/18/2018   Morbid obesity with BMI  of 40.0-44.9, adult (HCC) 04/03/2018   Hyperlipidemia associated with type 2 diabetes mellitus (HCC) 02/05/2018   Prostate cancer (HCC) 09/08/2017   Gastroesophageal reflux disease without esophagitis 07/29/2017   Other dysphagia 07/29/2017   Acute cerebrovascular accident (CVA) (HCC) 01/30/2016   Chronic kidney disease 07/14/2014   Clinical depression 07/14/2014   Gait instability 05/05/2014   Arthritis of knee 07/03/2013   Arteriosclerosis of coronary artery 07/03/2013   Type 2 diabetes mellitus with diabetic neuropathy, without long-term current use of insulin  (HCC) 07/03/2013   Benign essential HTN 07/03/2013   HLD (hyperlipidemia) 07/03/2013   Breath shortness 07/03/2013   Atherosclerotic heart disease of native coronary artery without angina pectoris 07/03/2013   Sleep apnea 06/18/2013   History of Barrett's esophagus 01/08/2001    ONSET DATE: on and off for years  REFERRING DIAG: imbalance  THERAPY DIAG:  Dizziness and giddiness  Difficulty in walking, not elsewhere classified  Unsteadiness on feet  Muscle weakness (generalized)  Cervicalgia  Rationale for Evaluation and Treatment: Rehabilitation  SUBJECTIVE:  SUBJECTIVE STATEMENT: Pt reports L side pain still present but that it has gotten a little better since last seen.  Pt reports he has exercised a little bit, says probably not as much as he should. He reports no stumbles/falls. Pt reports still dealing with dizziness although he feels it improved after a previous CRM provided in PT.   Pt accompanied by: self (wife, Sharman Debar) in waiting room   PERTINENT HISTORY: Patient presents with referral for imbalance. He has additional PMH of dementia, CVA July and September 2023 ( L occipital lobe and L thalamus) and 2018, A fib,chronic  pain due to neuropathy, HTN, DM, HLD, OSA, anemia, Barrett's esophagus, depression, prostate cancer, obesity.  Patient did have a fall in February 2025 where he got out of a car and fell forward. Patient has vertigo all the time per patient and his legs are giving out on him. Sees orthopedics about chronic DJD of knees, history of joint injections into his knees. Pt has extensive cardiopulmonary hx with Paroxysmal atrial fibrillation and COPD.   PAIN:  Are you having pain? Yes, bilat knee pain   PRECAUTIONS: Fall, has inserted loop recorder  RED FLAGS: None   WEIGHT BEARING RESTRICTIONS: No  FALLS: Has patient fallen in last 6 months? Yes. Number of falls 2  LIVING ENVIRONMENT: Lives with: lives with their spouse Lives in: House/apartment Stairs: 1 step onto the porch, and one onto the house; has a chair lift for inside the house Has following equipment at home: Walker - 4 wheeled, shower chair, and Grab bars  PLOF: Independent with basic ADLs  PATIENT GOALS: to conquer the vertigo and strengthen his BLE.   OBJECTIVE:  Note: Objective measures were completed at Evaluation unless otherwise noted.  DIAGNOSTIC FINDINGS:  Lower Extremity EMG 09/16/2018 - This is an abnormal electrodiagnostic study consistent with a 1) generalized sensory polyneuropathy. 2) Superimposed bilateral S1 radiculopathies.   MRI C-Spine 11/13/2019: Prior ACDF at C5-C7 without residual spinal stenosis. Persistent uncovertebral spurring with residual mild to moderate bilateral C6 and C7 foraminal stenosis as above. Mild disc bulging with uncovertebral and facet hypertrophy at C3-4 and C4-5 with resultant mild spinal stenosis but no cord deformity. Moderate left and mild right C4 and C5 foraminal narrowing. Moderate multilevel facet arthrosis throughout the cervical spine as above, which could contribute to underlying neck pain.   MRI L-Spine 11/13/2019: Multilevel degenerative changes of the lumbar spine as described  above, progressed at L2-L3 with there is now moderate spinal canal stenosis. Unchanged moderate to severe spinal canal stenosis and severe Left neuroforaminal stenosis at L3-L4. Unchanged moderate bilateral neuro foraminal stenosis at L5-S1.   MRI Brain 09/09/21 1. Acute lacunar infarct in the right corona radiata.  2. Chronic small vessel ischemic disease with multiple chronic  infarcts as above.   COGNITION: Overall cognitive status: Impaired, History of cognitive impairments - at baseline, and late onset dementia   SENSATION: WFL  COORDINATION: Heel slide test: WFL  POSTURE: rounded shoulders, forward head, and flexed trunk   LOWER EXTREMITY MMT:    MMT Right Eval Left Eval  Hip flexion 4 4  Hip extension    Hip abduction 4 4  Hip adduction 4 4  Hip internal rotation    Hip external rotation    Knee flexion 4- 4-  Knee extension 4- 4  Ankle dorsiflexion 4- 4-  Ankle plantarflexion 4- 4-  Ankle inversion    Ankle eversion    (Blank rows = not tested)  TRANSFERS: Sit  to stand: CGA  Assistive device utilized: Environmental consultant - 4 wheeled     Stand to sit: CGA  Assistive device utilized: Environmental consultant - 4 wheeled     Chair to chair: CGA and Min A  Assistive device utilized: Environmental consultant - 4 wheeled       GAIT: Findings: Gait Characteristics: step through pattern, decreased stride length, shuffling, trunk flexed, poor foot clearance- Right, and poor foot clearance- Left, Distance walked: 60 ft, Assistive device utilized:Walker - 4 wheeled, Level of assistance: CGA, and Comments: increased knee flexion with fatigue  FUNCTIONAL TESTS:  5 times sit to stand: 13 seconds heavy  BUE support  10 meter walk test: 9 seconds with 4 [employer] Berg Balance Scale: 27  PATIENT SURVEYS:  None given                                                                                                                              TREATMENT DATE: 06/26/23  TA: Vitals: prior to the following interventions BP, seated:  132/63 mmHg HR 66 bpm   NMR: Right Dix-Hallpike: difficulty achieving correct positioning due to neck mobility/body habitus, test otherwise negative no nystagmus Left Dix-Hallpike: difficulty achieving correct positioning due to neck mobility/body habitus, test otherwise negative no nystagmus Sidelying test: negative bilaterally  Comments: vague descriptions of dizziness throughout, at one point pt reported feeling better in testing positions   PT provided testing precautions instructions to pt and spouse  Manual: Pt seated STM provided to bilat posterior shoulder mm, bilat UT, and bilat cervical parapsinals to address cervical discomfort/limited mobility found with other testng. Pt reports neck feels good turning to L sice/ is ok, but turning to R still feels limited, has a little twinge.    PATIENT EDUCATION: Education details: findings of repeat DH assessment  Person educated: Patient, spouse Education method: Explanation Education comprehension: verbalized understanding  HOME EXERCISE PROGRAM: Access Code: [email or web address] URL: [email or web address] Date: 06/05/2023 Prepared by: Atlee Blanks  Exercises - Standing March with Counter Support  - 1 x daily - 7 x weekly - 3 sets - 20 reps - Side Stepping with Counter Support  - 1 x daily - 7 x weekly - 3 sets - 20 reps  GOALS: Goals reviewed with patient? Yes  SHORT TERM GOALS: Target date: 06/11/2023 Patient will be independent in home exercise program to improve strength/mobility for better functional independence with ADLs. Goal status: INITIAL  LONG TERM GOALS: Target date: 08/06/2023 Patient will reduce dizziness handicap inventory score to <50, for less dizziness with ADLs and increased safety with home and work tasks.  Baseline: 05/21/2023:  42/moderate handicap Goal status: INITIAL  2.  Patient will increase Berg Balance score by > 6 points to demonstrate decreased fall risk during functional  activities. Baseline: 5/6: 27 Goal status: INITIAL  3.  Patient will increase BLE gross strength to 4+/5 as to improve functional strength for independent gait, increased standing tolerance and increased ADL ability. Baseline: see  above Goal status: INITIAL  4.  Patient will be able to perform five time sit to stand without UE support in <15 seconds to demonstrate improved LE strength and improved mobility at home and in the community.  Baseline: heavy BUE support 13 seconds Goal status: INITIAL  ASSESSMENT:  CLINICAL IMPRESSION:  PT repeated Dix-Hallpike testing today per plan. Pt without nystagmus in L or R positions. Did have to also test in sidelying position as pt cervical mobility/body habitus limited Dix-Hallpike technique. Pt does continue to report vague dizzy symptoms at rest and in different positions with long history of this issue (decades). I suspect pt likely has other contributors to chronic dizziness.  Pt will continue to benefit from skilled physical therapy to address remaining deficits in order to improve overall QOL, decrease fall risk, decrease dizziness symptoms. and return to PLOF.    OBJECTIVE IMPAIRMENTS: Abnormal gait, cardiopulmonary status limiting activity, decreased activity tolerance, decreased balance, decreased cognition, decreased endurance, decreased knowledge of condition, decreased mobility, difficulty walking, decreased strength, impaired perceived functional ability, improper body mechanics, postural dysfunction, obesity, and pain.   ACTIVITY LIMITATIONS: carrying, lifting, bending, standing, squatting, stairs, transfers, bed mobility, toileting, locomotion level, and caring for others  PARTICIPATION LIMITATIONS: meal prep, cleaning, laundry, driving, shopping, community activity, and yard work  PERSONAL FACTORS: Age, Fitness, Past/current experiences, Time since onset of injury/illness/exacerbation, Transportation, and 3+ comorbidities: dementia, CVA  July and September 2023( L occipital lobe and L thalamus) and 2018, A fib,chronic pain due to neuropathy, HTN, DM, HLD, OSA, anemia, Barrett's esophagus, depression, prostate cancer, obesity are also affecting patient's functional outcome.   REHAB POTENTIAL: Good  CLINICAL DECISION MAKING: Evolving/moderate complexity  EVALUATION COMPLEXITY: Moderate  PLAN:  PT FREQUENCY: 2x/week  PT DURATION: 12 weeks  PLANNED INTERVENTIONS: 97164- PT Re-evaluation, 97750- Physical Performance Testing, 97110-Therapeutic exercises, 97530- Therapeutic activity, [id number]- Neuromuscular re-education, 97535- Self Care, 60454- Manual therapy, [id number]- Gait training, [phone]- Orthotic Initial, [id number]- Orthotic/Prosthetic subsequent, [phone]- Canalith repositioning, [id number]- Electrical stimulation (unattended), [phone]- Electrical stimulation (manual), [id number]- Vasopneumatic device, [id number]- Traction (mechanical), [id number]- Ionotophoresis 4mg /ml Dexamethasone , Patient/Family education, Balance training, Stair training, Taping, Dry Needling, Joint mobilization, Spinal mobilization, Vestibular training, Visual/preceptual remediation/compensation, DME instructions, Cryotherapy, Moist heat, and Biofeedback  PLAN FOR NEXT SESSION:  *Recheck orthostatic hypotension* - maybe re-assess positional tests if pt reporting additional bouts of vertigo; otherwise, feel pt likely experiencing imbalance vs lightheadedness - standing balance  - narrow BOS  - reaching  - foot taps   - eyes closed - dynamic gait training with use of rollator for safety or knee pain management as needed    Aminta Kales PT, DPT  Physical Therapist - [facility]  [facility]  10:24 AM 06/26/23

## 2023-06-27 DIAGNOSIS — Z7901 Long term (current) use of anticoagulants: Secondary | ICD-10-CM | POA: Diagnosis not present

## 2023-06-27 DIAGNOSIS — Z8673 Personal history of transient ischemic attack (TIA), and cerebral infarction without residual deficits: Secondary | ICD-10-CM | POA: Diagnosis not present

## 2023-07-01 ENCOUNTER — Ambulatory Visit

## 2023-07-03 ENCOUNTER — Ambulatory Visit

## 2023-07-03 DIAGNOSIS — R42 Dizziness and giddiness: Secondary | ICD-10-CM

## 2023-07-03 DIAGNOSIS — R262 Difficulty in walking, not elsewhere classified: Secondary | ICD-10-CM | POA: Diagnosis not present

## 2023-07-03 DIAGNOSIS — M542 Cervicalgia: Secondary | ICD-10-CM | POA: Diagnosis not present

## 2023-07-03 DIAGNOSIS — M6281 Muscle weakness (generalized): Secondary | ICD-10-CM | POA: Diagnosis not present

## 2023-07-03 DIAGNOSIS — R2681 Unsteadiness on feet: Secondary | ICD-10-CM | POA: Diagnosis not present

## 2023-07-03 NOTE — Therapy (Signed)
 OUTPATIENT PHYSICAL THERAPY TREATMENT/Physical Therapy Progress Note   Dates of reporting period  05/14/2023   to   07/03/2023    Patient Name: Taylor Austin MRN: [id number] DOB:July 24, 1940, 83 y.o., male Today's Date: 07/03/2023  PCP: Glover Alm MART PROVIDER: Maree Hila  END OF SESSION:   PT End of Session - 07/03/23 1010     Visit Number 10    Number of Visits 24    Date for PT Re-Evaluation 08/06/23    Equipment Utilized During Treatment Gait belt    Activity Tolerance Patient tolerated treatment well;Patient limited by fatigue    Behavior During Therapy [facility] for tasks assessed/performed            Past Medical History:  Diagnosis Date   Allergy    Anemia    Arthritis    Back pain    Barrett esophagus    BPH (benign prostatic hyperplasia)    CHF (congestive heart failure) (HCC)    Chronic kidney disease    COPD (chronic obstructive pulmonary disease) (HCC)    Coronary artery disease    Depression    Diabetes mellitus without complication (HCC)    Diverticulitis    Dysphagia    Esophageal reflux    Esophageal reflux    GERD (gastroesophageal reflux disease)    Heart murmur    Hyperlipidemia    Hypersomnia    Hypertension    Kidney stones    Low testosterone    OAB (overactive bladder)    Obesity    Orthostatic hypotension    Presence of dental bridge    2 - top   Rheumatic fever    Sleep apnea    BiPAP   Stroke (HCC) 01/2016   Vertigo    Vitamin B12 deficiency    Past Surgical History:  Procedure Laterality Date   BACK SURGERY     CARDIAC CATHETERIZATION  08/11/2014   Procedure: Left Heart Cath and Coronary Angiography;  Surgeon: Vinie DELENA Jude, MD;  Location: [facility] INVASIVE CV [facility];  Service: Cardiovascular;;   CARDIAC CATHETERIZATION N/A 11/17/2014   Procedure: Right Heart Cath;  Surgeon: Vinie DELENA Jude, MD;  Location: [facility] INV[facility]IVE CV LAB;  Service: Cardiovascular;  Laterality: N/A;   CAROTID STENT     CATARACT EXTRACTION  W/PHACO Left 04/03/2016   Procedure: CATARACT EXTRACTION PHACO AND INTRAOCULAR LENS PLACEMENT (IOC) left diabetic;  Surgeon: Adine Oneil Novak, MD;  Location: [facility];  Service: Ophthalmology;  Laterality: Left;  diabetic - oral meds sleep apnea   CATARACT EXTRACTION W/PHACO Right 05/01/2016   Procedure: CATARACT EXTRACTION PHACO AND INTRAOCULAR LENS PLACEMENT (IOC) Right diabetic;  Surgeon: Adine Oneil Novak, MD;  Location: [facility];  Service: Ophthalmology;  Laterality: Right;  Diabetic oral meds sleep apnea   CERVICAL SPINE SURGERY     COLONOSCOPY     COLONOSCOPY WITH PROPOFOL  N/A 06/10/2015   Procedure: COLONOSCOPY WITH PROPOFOL ;  Surgeon: Lamar ONEIDA Holmes, MD;  Location: [facility] ENDOSCOPY;  Service: Endoscopy;  Laterality: N/A;   COLONOSCOPY WITH PROPOFOL  N/A 02/28/2023   Procedure: COLONOSCOPY WITH PROPOFOL ;  Surgeon: Jinny Carmine, MD;  Location: [facility] ENDOSCOPY;  Service: Endoscopy;  Laterality: N/A;   CORONARY ANGIOPLASTY     ESOPHAGOGASTRODUODENOSCOPY N/A 02/28/2023   Procedure: ESOPHAGOGASTRODUODENOSCOPY (EGD);  Surgeon: Jinny Carmine, MD;  Location: [facility] ENDOSCOPY;  Service: Endoscopy;  Laterality: N/A;   ESOPHAGOGASTRODUODENOSCOPY N/A 06/06/2023   Procedure: EGD (ESOPHAGOGASTRODUODENOSCOPY);  Surgeon: Onita Elspeth Sharper, DO;  Location: [facility] ENDOSCOPY;  Service: Gastroenterology;  Laterality:  N/A;   ESOPHAGOGASTRODUODENOSCOPY (EGD) WITH PROPOFOL  N/A 06/28/2017   Procedure: ESOPHAGOGASTRODUODENOSCOPY (EGD) WITH PROPOFOL ;  Surgeon: Viktoria Lamar DASEN, MD;  Location[facility] END[facility]COPY;  Service: Endoscopy;  Laterality: N/A;   ESOPHAGOGASTRODUODENOSCOPY ENDOSCOPY     EYE SURGERY     LUMBAR SPINE SURGERY     POLYPECTOMY  02/28/2023   Procedure: POLYPECTOMY;  Surgeon: Jinny Carmine, MD;  Location: [facility] ENDOSCOPY;  Service: Endoscopy;;   THORACIC SPINE SURGERY     TONSILLECTOMY     Patient Active Problem List   Diagnosis Date Noted   Polyp of descending colon 02/28/2023    Duodenal ulceration 02/28/2023   Symptomatic anemia 02/26/2023   Upper GI bleed 02/26/2023   Morbid obesity (HCC) 02/26/2023   History of ischemic stroke (07/23/2021) 12/11/2021   Stroke-like symptoms 09/09/2021   Chronic diastolic CHF (congestive heart failure) (HCC) 07/26/2021   Ischemic stroke (HCC) 07/23/2021   Acute ischemic stroke (HCC) 07/23/2021   Cerebral infarction, unspecified (HCC) 07/23/2021   Fasting hyperglycemia 07/18/2021   Orthostatic hypotension 07/18/2021   Lumbar facet syndrome 12/20/2020   Vertigo, Chronic 12/07/2020   Retrolisthesis of L2 over L3 12/07/2020   At high risk for falls 12/07/2020   Aortic atherosclerosis (HCC) 09/20/2020   Hypomagnesemia 07/27/2020   Spondylosis without myelopathy or radiculopathy, lumbosacral region 07/27/2020   Mixed Alzheimer's and vascular dementia (HCC) 06/17/2020   Chronic pain syndrome 06/15/2020   Pharmacologic therapy 06/15/2020   Disorder of skeletal system 06/15/2020   Problems influencing health status 06/15/2020   Abnormal MRI, cervical spine (11/14/2019) 06/15/2020   Abnormal MRI, lumbar spine (11/14/2019) 06/15/2020   History of lumbar laminectomy 06/15/2020   History of fusion of cervical spine 06/15/2020   DDD (degenerative disc disease), cervical 06/15/2020   DDD (degenerative disc disease), lumbosacral 06/15/2020   Lumbosacral facet arthropathy (Multilevel) (Bilateral) 06/15/2020   Cervical facet hypertrophy 06/15/2020   DJD (degenerative joint disease) of cervical spine 06/15/2020   Chronic anticoagulation (Plavix ) 06/15/2020   Lumbar facet joint pain (Bilateral) 06/15/2020   History of CVA (cerebrovascular accident) 06/15/2020   Poor historian 06/15/2020   Chronic low back pain (1ry area of Pain) (Bilateral) (R>L) w/o sciatica 12/15/2019   Lumbar stenosis with neurogenic claudication 12/15/2019   Memory loss, short term 10/09/2019   Chronic obstructive pulmonary disease (HCC) 09/18/2018   Morbid obesity  with BMI of 40.0-44.9, adult (HCC) 04/03/2018   Hyperlipidemia associated with type 2 diabetes mellitus (HCC) 02/05/2018   Prostate cancer (HCC) 09/08/2017   Gastroesophageal reflux disease without esophagitis 07/29/2017   Other dysphagia 07/29/2017   Acute cerebrovascular accident (CVA) (HCC) 01/30/2016   Chronic kidney disease 07/14/2014   Clinical depression 07/14/2014   Gait instability 05/05/2014   Arthritis of knee 07/03/2013   Arteriosclerosis of coronary artery 07/03/2013   Type 2 diabetes mellitus with diabetic neuropathy, without long-term current use of insulin  (HCC) 07/03/2013   Benign essential HTN 07/03/2013   HLD (hyperlipidemia) 07/03/2013   Breath shortness 07/03/2013   Atherosclerotic heart disease of native coronary artery without angina pectoris 07/03/2013   Sleep apnea 06/18/2013   History of Barrett's esophagus 01/08/2001    ONSET DATE: on and off for years  REFERRING DIAG: imbalance  THERAPY DIAG:  No diagnosis found.  Rationale for Evaluation and Treatment: Rehabilitation  SUBJECTIVE:  SUBJECTIVE STATEMENT: Pt reports L side pain is pretty much gone now. He reports no other updates. Pt reports he's had a cough for a while, but no other symptoms. He also says he's been feeling unwell for four days, almost didn't come to PT today.   Pt accompanied by: self (wife, Iantha) in waiting room   PERTINENT HISTORY: Patient presents with referral for imbalance. He has additional PMH of dementia, CVA July and September 2023 ( L occipital lobe and L thalamus) and 2018, A fib,chronic pain due to neuropathy, HTN, DM, HLD, OSA, anemia, Barrett's esophagus, depression, prostate cancer, obesity.  Patient did have a fall in February 2025 where he got out of a car and fell forward. Patient  has vertigo all the time per patient and his legs are giving out on him. Sees orthopedics about chronic DJD of knees, history of joint injections into his knees. Pt has extensive cardiopulmonary hx with Paroxysmal atrial fibrillation and COPD.   PAIN:  Are you having pain? Yes, bilat knee pain   PRECAUTIONS: Fall, has inserted loop recorder  RED FLAGS: None   WEIGHT BEARING RESTRICTIONS: No  FALLS: Has patient fallen in last 6 months? Yes. Number of falls 2  LIVING ENVIRONMENT: Lives with: lives with their spouse Lives in: House/apartment Stairs: 1 step onto the porch, and one onto the house; has a chair lift for inside the house Has following equipment at home: Walker - 4 wheeled, shower chair, and Grab bars  PLOF: Independent with basic ADLs  PATIENT GOALS: to conquer the vertigo and strengthen his BLE.   OBJECTIVE:  Note: Objective measures were completed at Evaluation unless otherwise noted.  DIAGNOSTIC FINDINGS:  Lower Extremity EMG 09/16/2018 - This is an abnormal electrodiagnostic study consistent with a 1) generalized sensory polyneuropathy. 2) Superimposed bilateral S1 radiculopathies.   MRI C-Spine 11/13/2019: Prior ACDF at C5-C7 without residual spinal stenosis. Persistent uncovertebral spurring with residual mild to moderate bilateral C6 and C7 foraminal stenosis as above. Mild disc bulging with uncovertebral and facet hypertrophy at C3-4 and C4-5 with resultant mild spinal stenosis but no cord deformity. Moderate left and mild right C4 and C5 foraminal narrowing. Moderate multilevel facet arthrosis throughout the cervical spine as above, which could contribute to underlying neck pain.   MRI L-Spine 11/13/2019: Multilevel degenerative changes of the lumbar spine as described above, progressed at L2-L3 with there is now moderate spinal canal stenosis. Unchanged moderate to severe spinal canal stenosis and severe Left neuroforaminal stenosis at L3-L4. Unchanged moderate  bilateral neuro foraminal stenosis at L5-S1.   MRI Brain 09/09/21 1. Acute lacunar infarct in the right corona radiata.  2. Chronic small vessel ischemic disease with multiple chronic  infarcts as above.   COGNITION: Overall cognitive status: Impaired, History of cognitive impairments - at baseline, and late onset dementia   SENSATION: WFL  COORDINATION: Heel slide test: WFL  POSTURE: rounded shoulders, forward head, and flexed trunk   LOWER EXTREMITY MMT:    MMT Right Eval Left Eval  Hip flexion 4 4  Hip extension    Hip abduction 4 4  Hip adduction 4 4  Hip internal rotation    Hip external rotation    Knee flexion 4- 4-  Knee extension 4- 4  Ankle dorsiflexion 4- 4-  Ankle plantarflexion 4- 4-  Ankle inversion    Ankle eversion    (Blank rows = not tested)  TRANSFERS: Sit to stand: CGA  Assistive device utilized: Environmental consultant - 4 wheeled  Stand to sit: CGA  Assistive device utilized: Environmental consultant - 4 wheeled     Chair to chair: CGA and Min A  Assistive device utilized: Environmental consultant - 4 wheeled       GAIT: Findings: Gait Characteristics: step through pattern, decreased stride length, shuffling, trunk flexed, poor foot clearance- Right, and poor foot clearance- Left, Distance walked: 60 ft, Assistive device utilized:Walker - 4 wheeled, Level of assistance: CGA, and Comments: increased knee flexion with fatigue  FUNCTIONAL TESTS:  5 times sit to stand: 13 seconds heavy  BUE support  10 meter walk test: 9 seconds with 4 [employer] Berg Balance Scale: 27  PATIENT SURVEYS:  None given                                                                                                                              TREATMENT DATE: 07/03/23  Physical Performance:  Five times Sit to Stand Test (FTSS)  TIME: 18.5 sec, hands-free   Cut off scores indicative of increased fall risk: >12 sec CVA, >16 sec PD, >13 sec vestibular (ANPTA Core Set of Outcome Measures for Adults with Neurologic  Conditions, 2018)   MMT to assess LE strength on 5 pt scale: BLE strength is grossly 4+/5   gross BLE strength  BERG - Patient demonstrates increased fall risk as noted by score of  40 /56 on Berg Balance Scale.  (<36= high risk for falls, close to 100%; 37-45 significant >80%; 46-51 moderate >50%; 52-55 lower >25%)  [facility] PT Assessment - 07/03/23 0001       Berg Balance Test   Sit to Stand Able to stand without using hands and stabilize independently    Standing Unsupported Able to stand safely 2 minutes    Sitting with Back Unsupported but Feet Supported on Floor or Stool Able to sit safely and securely 2 minutes    Stand to Sit Sits safely with minimal use of hands    Transfers Able to transfer safely, definite need of hands    Standing Unsupported with Eyes Closed Able to stand 3 seconds    Standing Unsupported with Feet Together Able to place feet together independently and stand for 1 minute with supervision    From Standing, Reach Forward with Outstretched Arm Can reach forward >12 cm safely (5)    From Standing Position, Pick up Object from Floor Able to pick up shoe, needs supervision    From Standing Position, Turn to Look Behind Over each Shoulder Looks behind from both sides and weight shifts well    Turn 360 Degrees Able to turn 360 degrees safely but slowly    Standing Unsupported, Alternately Place Feet on Step/Stool Able to stand independently and complete 8 steps >20 seconds    Standing Unsupported, One Foot in Front Needs help to step but can hold 15 seconds    Standing on One Leg Unable to try or needs assist to prevent fall  Total Score 40            NMR: DHI - 34  PT reviewed importance of HEP, discussed strategies of improving compliance, such as start with 1 exercise to focus on for the week, swithc the next week.  TA: Vitals assessed as pt reports feeling a bit unwell, lightheaded: 163/64 mmHg, HR 72 bpm. PT reviewed findings with pt and spouse.  Encouraged them to continue to track BP at home to make sure it doesn't continue increasing, contact physician if this is the case.    PATIENT EDUCATION: Education details: findings of progress visit/assessment, vitals  Person educated: Patient, spouse Education method: Explanation Education comprehension: verbalized understanding  HOME EXERCISE PROGRAM: Access Code: [email or web address] URL: [email or web address] Date: 06/05/2023 Prepared by: Peggye Linear  Exercises - Standing March with Counter Support  - 1 x daily - 7 x weekly - 3 sets - 20 reps - Side Stepping with Counter Support  - 1 x daily - 7 x weekly - 3 sets - 20 reps  GOALS: Goals reviewed with patient? Yes  SHORT TERM GOALS: Target date: 06/11/2023 Patient will be independent in home exercise program to improve strength/mobility for better functional independence with ADLs. Baseline: 6/25: Pt reports not completing HEP per prescribed consistency, says he does them maybe half the time. Reports he feels at times they're too strenuous.  Goal status: ONGOING  LONG TERM GOALS: Target date: 08/06/2023 Patient will reduce dizziness handicap inventory score to <50, for less dizziness with ADLs and increased safety with home and work tasks.  Baseline: 05/21/2023:  42/moderate handicap; 6/25: 34  Goal status: ONGOING  2.  Patient will increase Berg Balance score by > 6 points to demonstrate decreased fall risk during functional activities. Baseline: 5/6: 27; 6/25: 40/56 Goal status: MET  3.  Patient will increase BLE gross strength to 4+/5 as to improve functional strength for independent gait, increased standing tolerance and increased ADL ability. Baseline: see above; 6/25: BLE strength is grossly 4+/5  Goal status: MET  4.  Patient will be able to perform five time sit to stand without UE support in <15 seconds to demonstrate improved LE strength and improved mobility at home and in the community.  Baseline: heavy BUE  support 13 seconds; 6/25: 18.5 sec, hands-free  Goal status: ONGOING  ASSESSMENT:  CLINICAL IMPRESSION:  PT ended session somewhat early as pt feeling unwell today. His BP is higher than it has been the past few visits (PT informed pt and spouse, instructed to track at home, contact dr if worsens). Pt completed goal reassessment for progress visit. He has now met BERG and MMT goals, indicating increased BLE strength and improved balance. While he took longer to complete 5xSTS, he was able to do so without use of BUEs, also indicating improved BLE strength. Pt will continue to benefit from skilled physical therapy to address remaining deficits in order to improve overall QOL, decrease fall risk, decrease dizziness symptoms. and return to PLOF.    OBJECTIVE IMPAIRMENTS: Abnormal gait, cardiopulmonary status limiting activity, decreased activity tolerance, decreased balance, decreased cognition, decreased endurance, decreased knowledge of condition, decreased mobility, difficulty walking, decreased strength, impaired perceived functional ability, improper body mechanics, postural dysfunction, obesity, and pain.   ACTIVITY LIMITATIONS: carrying, lifting, bending, standing, squatting, stairs, transfers, bed mobility, toileting, locomotion level, and caring for others  PARTICIPATION LIMITATIONS: meal prep, cleaning, laundry, driving, shopping, community activity, and yard work  PERSONAL FACTORS: Age, Fitness, Past/current experiences, Time since onset of injury/illness/exacerbation, Transportation,  and 3+ comorbidities: dementia, CVA July and September 2023( L occipital lobe and L thalamus) and 2018, A fib,chronic pain due to neuropathy, HTN, DM, HLD, OSA, anemia, Barrett's esophagus, depression, prostate cancer, obesity are also affecting patient's functional outcome.   REHAB POTENTIAL: Good  CLINICAL DECISION MAKING: Evolving/moderate complexity  EVALUATION COMPLEXITY: Moderate  PLAN:  PT  FREQUENCY: 2x/week  PT DURATION: 12 weeks  PLANNED INTERVENTIONS: 97164- PT Re-evaluation, 97750- Physical Performance Testing, 97110-Therapeutic exercises, 97530- Therapeutic activity, [id number]- Neuromuscular re-education, 97535- Self Care, [id number]- Manual therapy, [id number]- Gait training, [phone]- Orthotic Initial, [phone]- Orthotic/Prosthetic subsequent, [phone]- Canalith repositioning, [id number]- Electrical stimulation (unattended), [phone]- Electrical stimulation (manual), [id number]- Vasopneumatic device, [id number]- Traction (mechanical), [id number]- Ionotophoresis 4mg /ml Dexamethasone , Patient/Family education, Balance training, Stair training, Taping, Dry Needling, Joint mobilization, Spinal mobilization, Vestibular training, Visual/preceptual remediation/compensation, DME instructions, Cryotherapy, Moist heat, and Biofeedback  PLAN FOR NEXT SESSION:  *Recheck orthostatic hypotension* - maybe re-assess positional tests if pt reporting additional bouts of vertigo; otherwise, feel pt likely experiencing imbalance vs lightheadedness - standing balance  - narrow BOS  - reaching  - foot taps   - eyes closed - dynamic gait training with use of rollator for safety or knee pain management as needed    Darryle Patten PT, DPT  Physical Therapist - [facility]  [facility]  10:11 AM 07/03/23

## 2023-07-04 DIAGNOSIS — Z8673 Personal history of transient ischemic attack (TIA), and cerebral infarction without residual deficits: Secondary | ICD-10-CM | POA: Diagnosis not present

## 2023-07-04 DIAGNOSIS — Z7901 Long term (current) use of anticoagulants: Secondary | ICD-10-CM | POA: Diagnosis not present

## 2023-07-08 ENCOUNTER — Other Ambulatory Visit: Payer: Self-pay

## 2023-07-08 ENCOUNTER — Encounter: Payer: Self-pay | Admitting: Emergency Medicine

## 2023-07-08 ENCOUNTER — Emergency Department

## 2023-07-08 ENCOUNTER — Ambulatory Visit

## 2023-07-08 ENCOUNTER — Emergency Department
Admission: EM | Admit: 2023-07-08 | Discharge: 2023-07-08 | Disposition: A | Discharge status: Home / Self Care | Source: Ambulatory Visit | Attending: Emergency Medicine | Admitting: Emergency Medicine

## 2023-07-08 DIAGNOSIS — Z981 Arthrodesis status: Secondary | ICD-10-CM | POA: Diagnosis not present

## 2023-07-08 DIAGNOSIS — I517 Cardiomegaly: Secondary | ICD-10-CM | POA: Diagnosis not present

## 2023-07-08 DIAGNOSIS — I509 Heart failure, unspecified: Secondary | ICD-10-CM | POA: Insufficient documentation

## 2023-07-08 DIAGNOSIS — E119 Type 2 diabetes mellitus without complications: Secondary | ICD-10-CM | POA: Insufficient documentation

## 2023-07-08 DIAGNOSIS — Z7901 Long term (current) use of anticoagulants: Secondary | ICD-10-CM | POA: Diagnosis not present

## 2023-07-08 DIAGNOSIS — J441 Chronic obstructive pulmonary disease with (acute) exacerbation: Secondary | ICD-10-CM | POA: Insufficient documentation

## 2023-07-08 DIAGNOSIS — I110 Hypertensive heart disease with heart failure: Secondary | ICD-10-CM | POA: Diagnosis not present

## 2023-07-08 DIAGNOSIS — R262 Difficulty in walking, not elsewhere classified: Secondary | ICD-10-CM

## 2023-07-08 DIAGNOSIS — R195 Other fecal abnormalities: Secondary | ICD-10-CM | POA: Diagnosis not present

## 2023-07-08 DIAGNOSIS — R059 Cough, unspecified: Secondary | ICD-10-CM | POA: Diagnosis not present

## 2023-07-08 DIAGNOSIS — M6281 Muscle weakness (generalized): Secondary | ICD-10-CM

## 2023-07-08 LAB — COMPREHENSIVE METABOLIC PANEL WITH GFR
ALT: 15 U/L (ref 0–44)
AST: 24 U/L (ref 15–41)
Albumin: 3.4 g/dL — ABNORMAL LOW (ref 3.5–5.0)
Alkaline Phosphatase: 62 U/L (ref 38–126)
Anion gap: 9 (ref 5–15)
BUN: 14 mg/dL (ref 8–23)
CO2: 26 mmol/L (ref 22–32)
Calcium: 8.8 mg/dL — ABNORMAL LOW (ref 8.9–10.3)
Chloride: 101 mmol/L (ref 98–111)
Creatinine, Ser: 0.85 mg/dL (ref 0.61–1.24)
GFR, Estimated: >60 mL/min (ref >60)
Glucose, Bld: 158 mg/dL — ABNORMAL HIGH (ref 70–99)
Potassium: 4.2 mmol/L (ref 3.5–5.1)
Sodium: 136 mmol/L (ref 135–145)
Total Bilirubin: 0.8 mg/dL (ref 0.0–1.2)
Total Protein: 6.5 g/dL (ref 6.5–8.1)

## 2023-07-08 LAB — CBC
HCT: 26.5 % — ABNORMAL LOW (ref 39.0–52.0)
Hemoglobin: 7.8 g/dL — ABNORMAL LOW (ref 13.0–17.0)
MCH: 23.5 pg — ABNORMAL LOW (ref 26.0–34.0)
MCHC: 29.4 g/dL — ABNORMAL LOW (ref 30.0–36.0)
MCV: 79.8 fL — ABNORMAL LOW (ref 80.0–100.0)
Platelets: 268 10*3/uL (ref 150–400)
RBC: 3.32 MIL/uL — ABNORMAL LOW (ref 4.22–5.81)
RDW: 22.3 % — ABNORMAL HIGH (ref 11.5–15.5)
WBC: 5.7 10*3/uL (ref 4.0–10.5)
nRBC: 0 % (ref 0.0–0.2)

## 2023-07-08 LAB — TYPE AND SCREEN
ABO/RH(D): O POS
Antibody Screen: NEGATIVE

## 2023-07-08 MED ORDER — PREDNISONE 20 MG PO TABS: 40.0000 mg | ORAL_TABLET | Freq: Every day | ORAL | 0 refills | Status: AC

## 2023-07-08 MED ORDER — SODIUM CHLORIDE 0.9 % IV BOLUS
500.0000 mL | Freq: Once | INTRAVENOUS | Status: AC
  Administered 2023-07-08: 500 mL via INTRAVENOUS

## 2023-07-08 MED ORDER — IPRATROPIUM-ALBUTEROL 0.5-2.5 (3) MG/3ML IN SOLN
6.0000 mL | Freq: Once | RESPIRATORY_TRACT | Status: AC
  Administered 2023-07-08: 6 mL via RESPIRATORY_TRACT
  Filled 2023-07-08: qty 9

## 2023-07-08 MED ORDER — METHYLPREDNISOLONE SODIUM SUCC 125 MG IJ SOLR
125.0000 mg | Freq: Once | INTRAMUSCULAR | Status: AC
  Administered 2023-07-08: 125 mg via INTRAVENOUS
  Filled 2023-07-08: qty 2

## 2023-07-08 MED ORDER — IPRATROPIUM-ALBUTEROL 0.5-2.5 (3) MG/3ML IN SOLN
3.0000 mL | Freq: Once | RESPIRATORY_TRACT | Status: AC
  Administered 2023-07-08: 3 mL via RESPIRATORY_TRACT
  Filled 2023-07-08: qty 3

## 2023-07-08 MED ORDER — AMOXICILLIN-POT CLAVULANATE 875-125 MG PO TABS: 1.0000 | ORAL_TABLET | Freq: Two times a day (BID) | ORAL | 0 refills | Status: AC

## 2023-07-08 NOTE — ED Notes (Signed)
 Patient heme negative per Dr. Levander.

## 2023-07-08 NOTE — Discharge Instructions (Addendum)
 You are seen in the ER today for evaluation of your shortness of breath.  I suspect this is related to COPD exacerbation.  I sent a steroid course as well as antibiotics to your pharmacy.  Please take these as directed.  Follow with your primary care doctor for further evaluation.  Return to the ER for new or worsening symptoms.

## 2023-07-08 NOTE — ED Triage Notes (Addendum)
 First Nurse Note: Patient to ED from [facility] for hypotension- 88/63 at PT this AM then 135/42. C/o black stool and SOB- Hx COPD. Black stools for x3 days. Hx of GI bleed. States he does feel lightheaded.

## 2023-07-08 NOTE — ED Provider Notes (Signed)
 [facility] Provider Note    Event Date/Time   First MD Initiated Contact with Patient 07/08/23 1115     (approximate)   History   Rectal Bleeding   HPI  TINY RIETZ is a 83 year old male with history of anemia, CHF, COPD, DM, HTN atrial fibrillation presenting to the emergency department for evaluation of cough, shortness of breath, dark stools.  Patient reports that over the last week he has had ongoing cough with associated shortness of breath.  Does report exertional dyspnea at baseline, does not think it is significantly worse.  At physical therapy today he was found to have a low blood pressure of 88/63 initially, improved on repeat, but it was recommended that the patient present to the ER for further evaluation.  He does note that he has had dark stools over the past 3 days.  Does have a history of a GI bleed.  I reviewed his discharge summary from 03/01/2023.  At that time, patient presented with bright red bleeding per rectum in the setting of Eliquis use for A-fib.  He had a nonbleeding duodenal ulcer noted on EGD, colonic polyp with nonbleeding hemorrhoids and diverticulosis.     Physical Exam   Triage Vital Signs: ED Triage Vitals  Encounter Vitals Group     BP 07/08/23 1114 132/86     Girls Systolic BP Percentile --      Girls Diastolic BP Percentile --      Boys Systolic BP Percentile --      Boys Diastolic BP Percentile --      Pulse Rate 07/08/23 1114 66     Resp 07/08/23 1114 17     Temp 07/08/23 1114 98.7 F (37.1 C)     Temp Source 07/08/23 1114 Oral     SpO2 07/08/23 1114 95 %     Weight 07/08/23 1113 260 lb (117.9 kg)     Height 07/08/23 1113 5' 5 (1.651 m)     Head Circumference --      Peak Flow --      Pain Score 07/08/23 1113 0     Pain Loc --      Pain Education --      Exclude from Growth Chart --     Most recent vital signs: Vitals:   07/08/23 1200 07/08/23 1230  BP: (!) 165/73 (!) 122/42  Pulse: 66 88   Resp: 17 16  Temp:    SpO2: 100% 97%     General: Awake, interactive  CV:  Regular rate, good peripheral perfusion.  Resp:  Unlabored respirations, lung sounds diminished bilaterally with tight lung sounds and wheezing  Abd:  Nondistended, soft, nontender to palpation, greenish dark stool on rectal exam, Hemoccult negative with moderate sample Neuro:  Symmetric facial movement, fluid speech   ED Results / Procedures / Treatments   Labs (all labs ordered are listed, but only abnormal results are displayed) Labs Reviewed  COMPREHENSIVE METABOLIC PANEL WITH GFR - Abnormal; Notable for the following components:      Result Value   Glucose, Bld 158 (*)    Calcium  8.8 (*)    Albumin 3.4 (*)    All other components within normal limits  CBC - Abnormal; Notable for the following components:   RBC 3.32 (*)    Hemoglobin 7.8 (*)    HCT 26.5 (*)    MCV 79.8 (*)    MCH 23.5 (*)    MCHC 29.4 (*)  RDW 22.3 (*)    All other components within normal limits  POC OCCULT BLOOD, ED  TYPE AND SCREEN     EKG EKG independently reviewed and interpreted by myself demonstrates:    RADIOLOGY Imaging independently reviewed and interpreted by myself demonstrates:  CXR without focal consolidation  Formal Radiology Read:  [facility] Chest Port 1 View Result Date: 07/08/2023 CLINICAL DATA:  Cough. EXAM: PORTABLE CHEST 1 VIEW COMPARISON:  Chest radiograph dated 10/28/2017. FINDINGS: Shallow inspiration. No focal consolidation, pleural effusion, pneumothorax. Mild cardiomegaly. A loop recorder device noted. Lower cervical fusion hardware. No acute osseous pathology. IMPRESSION: 1. No active disease. 2. Mild cardiomegaly. Electronically Signed   By: Vanetta Chou M.D.   On: 07/08/2023 12:30    PROCEDURES:  Critical Care performed: No  Procedures   MEDICATIONS ORDERED IN ED: Medications  ipratropium-albuterol (DUONEB) 0.5-2.5 (3) MG/3ML nebulizer solution 6 mL (6 mLs Nebulization Given 07/08/23  1153)  methylPREDNISolone  sodium succinate (SOLU-MEDROL ) 125 mg/2 mL injection 125 mg (125 mg Intravenous Given 07/08/23 1152)  sodium chloride  0.9 % bolus 500 mL (0 mLs Intravenous Stopped 07/08/23 1402)  ipratropium-albuterol (DUONEB) 0.5-2.5 (3) MG/3ML nebulizer solution 3 mL (3 mLs Nebulization Given 07/08/23 1418)     IMPRESSION / MDM / ASSESSMENT AND PLAN / ED COURSE  I reviewed the triage vital signs and the nursing notes.  Differential diagnosis includes, but is not limited to, COPD exacerbation, pneumonia, GI bleed, lower suspicion PE given abnormal lung sounds on exam, history of COPD, reassuring vitals  Patient's presentation is most consistent with acute presentation with potential threat to life or bodily function.  83 year old male presenting to the emergency department for evaluation of shortness of breath.  Concerns for low blood pressure prehospital, but reassuring blood pressure on multiple checks here.  Labs with normal white blood cell count, low hemoglobin at 7.8, but stable to improved from recent priors.  CMP without significant derangement.  Patient does have dark green stool on exam, Hemoccult negative, lower suspicion for GI bleed.  I suspect his shortness of breath is more respiratory in nature.  He does not have an obvious pneumonia on his x-Daryle [place], but he does have a history of COPD with change in his cough.  Patient was given DuoNeb and steroids.  On reevaluation he did have some mild ongoing wheezing but significantly improved air movement.  Discussed results of his workup.  Suspect likely COPD exacerbation.  Satting appropriately on room air, not in respiratory distress.  I did discuss discharge and patient is comfortable with this plan.  Will DC with prednisone  course, prescription for Augmentin.  Strict return precautions provided.  Patient discharged in stable condition.      FINAL CLINICAL IMPRESSION(S) / ED DIAGNOSES   Final diagnoses:  COPD exacerbation (HCC)   Dark stools     Rx / DC Orders   ED Discharge Orders          Ordered    predniSONE  (DELTASONE ) 20 MG tablet  Daily with breakfast        07/08/23 1425    amoxicillin-clavulanate (AUGMENTIN) 875-125 MG tablet  2 times daily        07/08/23 1425             Note:  This document was prepared using Dragon voice recognition software and may include unintentional dictation errors.   Levander Slate, MD 07/08/23 [phone]

## 2023-07-08 NOTE — Therapy (Signed)
 OUTPATIENT PHYSICAL THERAPY TREATMENT    Patient Name: Taylor Austin MRN: [id number] DOB:07-20-1940, 83 y.o., male Today's Date: 07/08/2023  PCP: Glover Alm MART PROVIDER: Maree Hila  END OF SESSION:   PT End of Session - 07/08/23 0931     Visit Number 11    Number of Visits 24    Date for PT Re-Evaluation 08/06/23    PT Start Time 0937    PT Stop Time 1012    PT Time Calculation (min) 35 min    Equipment Utilized During Treatment Gait belt    Activity Tolerance Patient tolerated treatment well;Patient limited by fatigue    Behavior During Therapy WFL for tasks assessed/performed            Past Medical History:  Diagnosis Date   Allergy    Anemia    Arthritis    Back pain    Barrett esophagus    BPH (benign prostatic hyperplasia)    CHF (congestive heart failure) (HCC)    Chronic kidney disease    COPD (chronic obstructive pulmonary disease) (HCC)    Coronary artery disease    Depression    Diabetes mellitus without complication (HCC)    Diverticulitis    Dysphagia    Esophageal reflux    Esophageal reflux    GERD (gastroesophageal reflux disease)    Heart murmur    Hyperlipidemia    Hypersomnia    Hypertension    Kidney stones    Low testosterone    OAB (overactive bladder)    Obesity    Orthostatic hypotension    Presence of dental bridge    2 - top   Rheumatic fever    Sleep apnea    BiPAP   Stroke (HCC) 01/2016   Vertigo    Vitamin B12 deficiency    Past Surgical History:  Procedure Laterality Date   BACK SURGERY     CARDIAC CATHETERIZATION  08/11/2014   Procedure: Left Heart Cath and Coronary Angiography;  Surgeon: Vinie DELENA Jude, MD;  Location: [facility] INV[facility] CV [facility];  Service: Cardiovascular;;   CARDIAC CATHETERIZATION N/A 11/17/2014   Procedure: Right Heart Cath;  Surgeon: Vinie DELENA Jude, MD;  Location: [facility] INV[facility]IVE CV LAB;  Service: Cardiovascular;  Laterality: N/A;   CAROTID STENT     CATARACT EXTRACTION W/PHACO Left  04/03/2016   Procedure: CATARACT EXTRACTION PHACO AND INTRAOCULAR LENS PLACEMENT (IOC) left diabetic;  Surgeon: Adine Oneil Novak, MD;  Location: [facility];  Service: Ophthalmology;  Laterality: Left;  diabetic - oral meds sleep apnea   CATARACT EXTRACTION W/PHACO Right 05/01/2016   Procedure: CATARACT EXTRACTION PHACO AND INTRAOCULAR LENS PLACEMENT (IOC) Right diabetic;  Surgeon: Adine Oneil Novak, MD;  Location: [facility];  Service: Ophthalmology;  Laterality: Right;  Diabetic oral meds sleep apnea   CERVICAL SPINE SURGERY     COLONOSCOPY     COLONOSCOPY WITH PROPOFOL  N/A 06/10/2015   Procedure: COLONOSCOPY WITH PROPOFOL ;  Surgeon: Lamar ONEIDA Holmes, MD;  Location: [facility] ENDOSCOPY;  Service: Endoscopy;  Laterality: N/A;   COLONOSCOPY WITH PROPOFOL  N/A 02/28/2023   Procedure: COLONOSCOPY WITH PROPOFOL ;  Surgeon: Jinny Carmine, MD;  Location: [facility]OSCOPY;  Service: Endoscopy;  Laterality: N/A;   CORONARY ANGIOPLASTY     ESOPHAGOGASTRODUODENOSCOPY N/A 02/28/2023   Procedure: ESOPHAGOGASTRODUODENOSCOPY (EGD);  Surgeon: Jinny Carmine, MD;  Location: [facility] ENDOSCOPY;  Service: Endoscopy;  Laterality: N/A;   ESOPHAGOGASTRODUODENOSCOPY N/A 06/06/2023   Procedure: EGD (ESOPHAGOGASTRODUODENOSCOPY);  Surgeon: Onita Elspeth Sharper, DO;  Location: [facility]  ENDOSCOPY;  Service: Gastroenterology;  Laterality: N/A;   ESOPHAGOGASTRODUODENOSCOPY (EGD) WITH PROPOFOL  N/A 06/28/2017   Procedure: ESOPHAGOGASTRODUODENOSCOPY (EGD) WITH PROPOFOL ;  Surgeon: Viktoria Lamar DASEN, MD;  Location: [facility] ENDOSCOPY;  Service: Endoscopy;  Laterality: N/A;   ESOPHAGOGASTRODUODENOSCOPY ENDOSCOPY     EYE SURGERY     LUMBAR SPINE SURGERY     POLYPECTOMY  02/28/2023   Procedure: POLYPECTOMY;  Surgeon: Jinny Carmine, MD;  Location: [facility] ENDOSCOPY;  Service: Endoscopy;;   THORACIC SPINE SURGERY     TONSILLECTOMY     Patient Active Problem List   Diagnosis Date Noted   Polyp of descending colon 02/28/2023   Duodenal  ulceration 02/28/2023   Symptomatic anemia 02/26/2023   Upper GI bleed 02/26/2023   Morbid obesity (HCC) 02/26/2023   History of ischemic stroke (07/23/2021) 12/11/2021   Stroke-like symptoms 09/09/2021   Chronic diastolic CHF (congestive heart failure) (HCC) 07/26/2021   Ischemic stroke (HCC) 07/23/2021   Acute ischemic stroke (HCC) 07/23/2021   Cerebral infarction, unspecified (HCC) 07/23/2021   Fasting hyperglycemia 07/18/2021   Orthostatic hypotension 07/18/2021   Lumbar facet syndrome 12/20/2020   Vertigo, Chronic 12/07/2020   Retrolisthesis of L2 over L3 12/07/2020   At high risk for falls 12/07/2020   Aortic atherosclerosis (HCC) 09/20/2020   Hypomagnesemia 07/27/2020   Spondylosis without myelopathy or radiculopathy, lumbosacral region 07/27/2020   Mixed Alzheimer's and vascular dementia (HCC) 06/17/2020   Chronic pain syndrome 06/15/2020   Pharmacologic therapy 06/15/2020   Disorder of skeletal system 06/15/2020   Problems influencing health status 06/15/2020   Abnormal MRI, cervical spine (11/14/2019) 06/15/2020   Abnormal MRI, lumbar spine (11/14/2019) 06/15/2020   History of lumbar laminectomy 06/15/2020   History of fusion of cervical spine 06/15/2020   DDD (degenerative disc disease), cervical 06/15/2020   DDD (degenerative disc disease), lumbosacral 06/15/2020   Lumbosacral facet arthropathy (Multilevel) (Bilateral) 06/15/2020   Cervical facet hypertrophy 06/15/2020   DJD (degenerative joint disease) of cervical spine 06/15/2020   Chronic anticoagulation (Plavix ) 06/15/2020   Lumbar facet joint pain (Bilateral) 06/15/2020   History of CVA (cerebrovascular accident) 06/15/2020   Poor historian 06/15/2020   Chronic low back pain (1ry area of Pain) (Bilateral) (R>L) w/o sciatica 12/15/2019   Lumbar stenosis with neurogenic claudication 12/15/2019   Memory loss, short term 10/09/2019   Chronic obstructive pulmonary disease (HCC) 09/18/2018   Morbid obesity with BMI  of 40.0-44.9, adult (HCC) 04/03/2018   Hyperlipidemia associated with type 2 diabetes mellitus (HCC) 02/05/2018   Prostate cancer (HCC) 09/08/2017   Gastroesophageal reflux disease without esophagitis 07/29/2017   Other dysphagia 07/29/2017   Acute cerebrovascular accident (CVA) (HCC) 01/30/2016   Chronic kidney disease 07/14/2014   Clinical depression 07/14/2014   Gait instability 05/05/2014   Arthritis of knee 07/03/2013   Arteriosclerosis of coronary artery 07/03/2013   Type 2 diabetes mellitus with diabetic neuropathy, without long-term current use of insulin  (HCC) 07/03/2013   Benign essential HTN 07/03/2013   HLD (hyperlipidemia) 07/03/2013   Breath shortness 07/03/2013   Atherosclerotic heart disease of native coronary artery without angina pectoris 07/03/2013   Sleep apnea 06/18/2013   History of Barrett's esophagus 01/08/2001    ONSET DATE: on and off for years  REFERRING DIAG: imbalance  THERAPY DIAG:  Muscle weakness (generalized)  Difficulty in walking, not elsewhere classified  Rationale for Evaluation and Treatment: Rehabilitation  SUBJECTIVE:  SUBJECTIVE STATEMENT: Pt still dealing with a cough, taking cough medicine for it. PT encourages pt to see his physician. Pt reports his knees are hurting him a bit more today. Otherwise he is about the same.   Pt accompanied by: self (wife, Iantha) in waiting room   PERTINENT HISTORY: Patient presents with referral for imbalance. He has additional PMH of dementia, CVA July and September 2023 ( L occipital lobe and L thalamus) and 2018, A fib,chronic pain due to neuropathy, HTN, DM, HLD, OSA, anemia, Barrett's esophagus, depression, prostate cancer, obesity.  Patient did have a fall in February 2025 where he got out of a car and fell  forward. Patient has vertigo all the time per patient and his legs are giving out on him. Sees orthopedics about chronic DJD of knees, history of joint injections into his knees. Pt has extensive cardiopulmonary hx with Paroxysmal atrial fibrillation and COPD.   PAIN:  Are you having pain? Yes, bilat knee pain   PRECAUTIONS: Fall, has inserted loop recorder  RED FLAGS: None   WEIGHT BEARING RESTRICTIONS: No  FALLS: Has patient fallen in last 6 months? Yes. Number of falls 2  LIVING ENVIRONMENT: Lives with: lives with their spouse Lives in: House/apartment Stairs: 1 step onto the porch, and one onto the house; has a chair lift for inside the house Has following equipment at home: Walker - 4 wheeled, shower chair, and Grab bars  PLOF: Independent with basic ADLs  PATIENT GOALS: to conquer the vertigo and strengthen his BLE.   OBJECTIVE:  Note: Objective measures were completed at Evaluation unless otherwise noted.  DIAGNOSTIC FINDINGS:  Lower Extremity EMG 09/16/2018 - This is an abnormal electrodiagnostic study consistent with a 1) generalized sensory polyneuropathy. 2) Superimposed bilateral S1 radiculopathies.   MRI C-Spine 11/13/2019: Prior ACDF at C5-C7 without residual spinal stenosis. Persistent uncovertebral spurring with residual mild to moderate bilateral C6 and C7 foraminal stenosis as above. Mild disc bulging with uncovertebral and facet hypertrophy at C3-4 and C4-5 with resultant mild spinal stenosis but no cord deformity. Moderate left and mild right C4 and C5 foraminal narrowing. Moderate multilevel facet arthrosis throughout the cervical spine as above, which could contribute to underlying neck pain.   MRI L-Spine 11/13/2019: Multilevel degenerative changes of the lumbar spine as described above, progressed at L2-L3 with there is now moderate spinal canal stenosis. Unchanged moderate to severe spinal canal stenosis and severe Left neuroforaminal stenosis at L3-L4. Unchanged  moderate bilateral neuro foraminal stenosis at L5-S1.   MRI Brain 09/09/21 1. Acute lacunar infarct in the right corona radiata.  2. Chronic small vessel ischemic disease with multiple chronic  infarcts as above.   COGNITION: Overall cognitive status: Impaired, History of cognitive impairments - at baseline, and late onset dementia   SENSATION: WFL  COORDINATION: Heel slide test: WFL  POSTURE: rounded shoulders, forward head, and flexed trunk   LOWER EXTREMITY MMT:    MMT Right Eval Left Eval  Hip flexion 4 4  Hip extension    Hip abduction 4 4  Hip adduction 4 4  Hip internal rotation    Hip external rotation    Knee flexion 4- 4-  Knee extension 4- 4  Ankle dorsiflexion 4- 4-  Ankle plantarflexion 4- 4-  Ankle inversion    Ankle eversion    (Blank rows = not tested)  TRANSFERS: Sit to stand: CGA  Assistive device utilized: Environmental consultant - 4 wheeled     Stand to sit: CGA  Assistive device  utilized: Environmental consultant - 4 wheeled     Chair to chair: CGA and Min A  Assistive device utilized: Environmental consultant - 4 wheeled       GAIT: Findings: Gait Characteristics: step through pattern, decreased stride length, shuffling, trunk flexed, poor foot clearance- Right, and poor foot clearance- Left, Distance walked: 60 ft, Assistive device utilized:Walker - 4 wheeled, Level of assistance: CGA, and Comments: increased knee flexion with fatigue  FUNCTIONAL TESTS:  5 times sit to stand: 13 seconds heavy  BUE support  10 meter walk test: 9 seconds with 4 [employer] Berg Balance Scale: 27  PATIENT SURVEYS:  None given                                                                                                                              TREATMENT DATE: 07/08/23   TA: strengthening and endurance of LE mm to improve activity tolerance, transfer and gait ability  Seated: LAQ 3x10 each LE March 3x10 each LE - pt rates medium, some soreness LLE  Seated adductor squeeze with p.ball 4x10 each LE  RTB hamstring  curls 3x10 each LE   STS 2x6 - somewhat limited by L knee pain   Comments: somewhat more limited today throughout due to fatigue, reports some lightheadedless  Vitals: seated BP: 88/63 bpm SpO2 98-100%, HR 68 bpm Pt reports to PT in session he really hasn't had much to drink today. PT provides water.  Pt completes 2x20 ankle rockers and BP reassessed BP, seated: 135/42 mmHg HR 61 bpm  Discussed findings with spouse with pt permission, encourage follow-up with physician today due to BP fluctuations and ongoing cough   Final BP assessment: 148/61 mmHg   Pt declines transport chair to leave session, feeling OK to walk to [facility] (attached to this building)   PATIENT EDUCATION: Education details:exercise technique, vitals, recommendations  Person educated: Patient, spouse Education method: Explanation Education comprehension: verbalized understanding  HOME EXERCISE PROGRAM: Access Code: [email or web address] URL: [email or web address] Date: 06/05/2023 Prepared by: Peggye Linear  Exercises - Standing March with Counter Support  - 1 x daily - 7 x weekly - 3 sets - 20 reps - Side Stepping with Counter Support  - 1 x daily - 7 x weekly - 3 sets - 20 reps  GOALS: Goals reviewed with patient? Yes  SHORT TERM GOALS: Target date: 06/11/2023 Patient will be independent in home exercise program to improve strength/mobility for better functional independence with ADLs. Baseline: 6/25: Pt reports not completing HEP per prescribed consistency, says he does them maybe half the time. Reports he feels at times they're too strenuous.  Goal status: ONGOING  LONG TERM GOALS: Target date: 08/06/2023 Patient will reduce dizziness handicap inventory score to <50, for less dizziness with ADLs and increased safety with home and work tasks.  Baseline: 05/21/2023:  42/moderate handicap; 6/25: 34  Goal status: ONGOING  2.  Patient will increase Berg Balance score by > 6 points  to  demonstrate decreased fall risk during functional activities. Baseline: 5/6: 27; 6/25: 40/56 Goal status: MET  3.  Patient will increase BLE gross strength to 4+/5 as to improve functional strength for independent gait, increased standing tolerance and increased ADL ability. Baseline: see above; 6/25: BLE strength is grossly 4+/5  Goal status: MET  4.  Patient will be able to perform five time sit to stand without UE support in <15 seconds to demonstrate improved LE strength and improved mobility at home and in the community.  Baseline: heavy BUE support 13 seconds; 6/25: 18.5 sec, hands-free  Goal status: ONGOING  ASSESSMENT:  CLINICAL IMPRESSION:  Pt presents to session still with cough and reports of lightheadedness with fatigue. PT monitored vitals throughout, and modified interventions as appropriate. PT recommended to pt and his spouse that pt try to be seen by physician or at urgent care today due to ongoing cough symptoms and BP fluctuation (see above). Pt and spouse agreeable to plan and report they will be going to [facility] following today's appointment. Pt will continue to benefit from skilled physical therapy to address remaining deficits in order to improve overall QOL, decrease fall risk, decrease dizziness symptoms. and return to PLOF.    OBJECTIVE IMPAIRMENTS: Abnormal gait, cardiopulmonary status limiting activity, decreased activity tolerance, decreased balance, decreased cognition, decreased endurance, decreased knowledge of condition, decreased mobility, difficulty walking, decreased strength, impaired perceived functional ability, improper body mechanics, postural dysfunction, obesity, and pain.   ACTIVITY LIMITATIONS: carrying, lifting, bending, standing, squatting, stairs, transfers, bed mobility, toileting, locomotion level, and caring for others  PARTICIPATION LIMITATIONS: meal prep, cleaning, laundry, driving, shopping, community activity, and yard  work  PERSONAL FACTORS: Age, Fitness, Past/current experiences, Time since onset of injury/illness/exacerbation, Transportation, and 3+ comorbidities: dementia, CVA July and September 2023( L occipital lobe and L thalamus) and 2018, A fib,chronic pain due to neuropathy, HTN, DM, HLD, OSA, anemia, Barrett's esophagus, depression, prostate cancer, obesity are also affecting patient's functional outcome.   REHAB POTENTIAL: Good  CLINICAL DECISION MAKING: Evolving/moderate complexity  EVALUATION COMPLEXITY: Moderate  PLAN:  PT FREQUENCY: 2x/week  PT DURATION: 12 weeks  PLANNED INTERVENTIONS: [id number]- PT Re-evaluation, 97750- Physical Performance Testing, 97110-Therapeutic exercises, 97530- Therapeutic activity, [id number]- Neuromuscular re-education, 97535- Self Care, [id number]- Manual therapy, [id number]- Gait training, [phone]- Orthotic Initial, [phone]- Orthotic/Prosthetic subsequent, [phone]- Canalith repositioning, [id number]- Electrical stimulation (unattended), [phone]- Electrical stimulation (manual), [id number]- Vasopneumatic device, [id number]- Traction (mechanical), [id number]- Ionotophoresis 4mg /ml Dexamethasone , Patient/Family education, Balance training, Stair training, Taping, Dry Needling, Joint mobilization, Spinal mobilization, Vestibular training, Visual/preceptual remediation/compensation, DME instructions, Cryotherapy, Moist heat, and Biofeedback  PLAN FOR NEXT SESSION:  *Recheck orthostatic hypotension* - maybe re-assess positional tests if pt reporting additional bouts of vertigo; otherwise, feel pt likely experiencing imbalance vs lightheadedness - standing balance  - narrow BOS  - reaching  - foot taps   - eyes closed - dynamic gait training with use of rollator for safety or knee pain management as needed    Darryle Patten PT, DPT  Physical Therapist - [facility]  [facility]  10:22 AM 07/08/23

## 2023-07-10 ENCOUNTER — Ambulatory Visit

## 2023-07-17 ENCOUNTER — Ambulatory Visit

## 2023-07-18 DIAGNOSIS — Z7901 Long term (current) use of anticoagulants: Secondary | ICD-10-CM | POA: Diagnosis not present

## 2023-07-18 DIAGNOSIS — Z8673 Personal history of transient ischemic attack (TIA), and cerebral infarction without residual deficits: Secondary | ICD-10-CM | POA: Diagnosis not present

## 2023-07-19 ENCOUNTER — Ambulatory Visit: Admitting: Physical Therapy

## 2023-07-24 ENCOUNTER — Ambulatory Visit: Admitting: Physical Therapy

## 2023-07-26 ENCOUNTER — Ambulatory Visit: Admitting: Physical Therapy

## 2023-07-30 ENCOUNTER — Ambulatory Visit

## 2023-07-31 ENCOUNTER — Ambulatory Visit: Admitting: Physical Therapy

## 2023-08-01 DIAGNOSIS — Z8673 Personal history of transient ischemic attack (TIA), and cerebral infarction without residual deficits: Secondary | ICD-10-CM | POA: Diagnosis not present

## 2023-08-01 DIAGNOSIS — Z7901 Long term (current) use of anticoagulants: Secondary | ICD-10-CM | POA: Diagnosis not present

## 2023-08-01 NOTE — Progress Notes (Signed)
 Documentation for warfarin management Last 3 INR results:  Lab Results  Component Value Date   INR 3.0 (!) 08/01/2023   INR 1.8 (!) 07/18/2023   INR 2.0 (!) 07/04/2023    Dosage adjustment or any change in the treatment regimen today: No.  INR within goal range. Patient advised to continue current warfarin dosing regimen of 46mg  total weekly dose, 6mg  (3mg  x 2 ) Tues,Thurs,Sat and 7mg  (4mg  x 1,3mg  x 1) all other days.   Management of result: nurse driven protocol implemented  Patient/caregiver education provided during today's visit: avoid NSAID use with warfarin therapy, NSAID and warfarin drug/drug interaction.  Patient/caregiver did verbalize understanding of the no change in dose and any education provided.   The patient/caregiver is instructed to return in three weeks for recheck.  Future Appointments     Date/Time Provider Department Center Visit Type   08/21/2023 11:15 AM [facility] [place] C LAB   08/21/2023 11:30 AM [facility] NURSE POD [facility] C NURSE VISIT   08/21/2023 11:30 AM [facility] C LAB   08/26/2023 11:00 AM Solum, Therisa Setter, MD [facility] C RETURN VISIT   10/17/2023 11:15 AM Callwood, Cara Endow, MD [facility] C FOLLOW UP   11/25/2023 10:45 AM Evern Allyson Hacker, NP [facility] C RETURN VISIT

## 2023-08-02 ENCOUNTER — Ambulatory Visit: Admitting: Physical Therapy

## 2023-08-07 ENCOUNTER — Ambulatory Visit: Admitting: Physical Therapy

## 2023-08-09 ENCOUNTER — Ambulatory Visit: Admitting: Physical Therapy

## 2023-08-13 ENCOUNTER — Emergency Department

## 2023-08-13 ENCOUNTER — Other Ambulatory Visit: Payer: Self-pay

## 2023-08-13 ENCOUNTER — Emergency Department
Admission: EM | Admit: 2023-08-13 | Discharge: 2023-08-13 | Disposition: A | Discharge status: Home / Self Care | Attending: Emergency Medicine | Admitting: Emergency Medicine

## 2023-08-13 DIAGNOSIS — I2510 Atherosclerotic heart disease of native coronary artery without angina pectoris: Secondary | ICD-10-CM | POA: Insufficient documentation

## 2023-08-13 DIAGNOSIS — J449 Chronic obstructive pulmonary disease, unspecified: Secondary | ICD-10-CM | POA: Diagnosis not present

## 2023-08-13 DIAGNOSIS — K4020 Bilateral inguinal hernia, without obstruction or gangrene, not specified as recurrent: Secondary | ICD-10-CM | POA: Diagnosis not present

## 2023-08-13 DIAGNOSIS — I509 Heart failure, unspecified: Secondary | ICD-10-CM | POA: Diagnosis not present

## 2023-08-13 DIAGNOSIS — R1032 Left lower quadrant pain: Secondary | ICD-10-CM | POA: Diagnosis not present

## 2023-08-13 DIAGNOSIS — K5750 Diverticulosis of both small and large intestine without perforation or abscess without bleeding: Secondary | ICD-10-CM | POA: Diagnosis not present

## 2023-08-13 DIAGNOSIS — K5792 Diverticulitis of intestine, part unspecified, without perforation or abscess without bleeding: Secondary | ICD-10-CM

## 2023-08-13 DIAGNOSIS — N189 Chronic kidney disease, unspecified: Secondary | ICD-10-CM | POA: Diagnosis not present

## 2023-08-13 DIAGNOSIS — E1122 Type 2 diabetes mellitus with diabetic chronic kidney disease: Secondary | ICD-10-CM | POA: Diagnosis not present

## 2023-08-13 DIAGNOSIS — K5732 Diverticulitis of large intestine without perforation or abscess without bleeding: Secondary | ICD-10-CM | POA: Diagnosis not present

## 2023-08-13 LAB — CBC
HCT: 28.4 % — ABNORMAL LOW (ref 39.0–52.0)
Hemoglobin: 8.4 g/dL — ABNORMAL LOW (ref 13.0–17.0)
MCH: 23.7 pg — ABNORMAL LOW (ref 26.0–34.0)
MCHC: 29.6 g/dL — ABNORMAL LOW (ref 30.0–36.0)
MCV: 80.2 fL (ref 80.0–100.0)
Platelets: 265 K/uL (ref 150–400)
RBC: 3.54 MIL/uL — ABNORMAL LOW (ref 4.22–5.81)
RDW: 20.7 % — ABNORMAL HIGH (ref 11.5–15.5)
WBC: 6.7 K/uL (ref 4.0–10.5)
nRBC: 0 % (ref 0.0–0.2)

## 2023-08-13 LAB — COMPREHENSIVE METABOLIC PANEL WITH GFR
ALT: 11 U/L (ref 0–44)
AST: 19 U/L (ref 15–41)
Albumin: 3.4 g/dL — ABNORMAL LOW (ref 3.5–5.0)
Alkaline Phosphatase: 61 U/L (ref 38–126)
Anion gap: 11 (ref 5–15)
BUN: 10 mg/dL (ref 8–23)
CO2: 25 mmol/L (ref 22–32)
Calcium: 9.2 mg/dL (ref 8.9–10.3)
Chloride: 101 mmol/L (ref 98–111)
Creatinine, Ser: 0.75 mg/dL (ref 0.61–1.24)
GFR, Estimated: >60 mL/min (ref >60)
Glucose, Bld: 125 mg/dL — ABNORMAL HIGH (ref 70–99)
Potassium: 4.5 mmol/L (ref 3.5–5.1)
Sodium: 137 mmol/L (ref 135–145)
Total Bilirubin: 0.8 mg/dL (ref 0.0–1.2)
Total Protein: 6.4 g/dL — ABNORMAL LOW (ref 6.5–8.1)

## 2023-08-13 LAB — URINALYSIS, ROUTINE W REFLEX MICROSCOPIC
Bilirubin Urine: NEGATIVE
Glucose, UA: NEGATIVE mg/dL
Hgb urine dipstick: NEGATIVE
Ketones, ur: NEGATIVE mg/dL
Leukocytes,Ua: NEGATIVE
Nitrite: NEGATIVE
Protein, ur: NEGATIVE mg/dL
Specific Gravity, Urine: 1.013 (ref 1.005–1.030)
pH: 8 (ref 5.0–8.0)

## 2023-08-13 LAB — LIPASE, BLOOD: Lipase: 29 U/L (ref 11–51)

## 2023-08-13 MED ORDER — ONDANSETRON HCL 4 MG/2ML IJ SOLN
4.0000 mg | Freq: Once | INTRAMUSCULAR | Status: AC
  Administered 2023-08-13: 4 mg via INTRAVENOUS
  Filled 2023-08-13: qty 2

## 2023-08-13 MED ORDER — MORPHINE SULFATE (PF) 2 MG/ML IV SOLN: 2.0000 mg | Freq: Once | INTRAVENOUS | Status: DC

## 2023-08-13 MED ORDER — AMOXICILLIN-POT CLAVULANATE 875-125 MG PO TABS: 1.0000 | ORAL_TABLET | Freq: Two times a day (BID) | ORAL | 0 refills | Status: AC

## 2023-08-13 MED ORDER — AMOXICILLIN-POT CLAVULANATE 875-125 MG PO TABS
1.0000 | ORAL_TABLET | Freq: Once | ORAL | Status: DC
  Filled 2023-08-13: qty 1

## 2023-08-13 MED ORDER — MORPHINE SULFATE (PF) 4 MG/ML IV SOLN
4.0000 mg | Freq: Once | INTRAVENOUS | Status: AC
  Administered 2023-08-13: 4 mg via INTRAVENOUS
  Filled 2023-08-13: qty 1

## 2023-08-13 MED ORDER — TRAMADOL HCL 50 MG PO TABS
50.0000 mg | ORAL_TABLET | Freq: Once | ORAL | Status: DC
  Filled 2023-08-13: qty 1

## 2023-08-13 MED ORDER — TRAMADOL HCL 50 MG PO TABS: 50.0000 mg | ORAL_TABLET | Freq: Four times a day (QID) | ORAL | 0 refills | Status: DC | PRN

## 2023-08-13 MED ORDER — IOHEXOL 300 MG/ML  SOLN
100.0000 mL | Freq: Once | INTRAMUSCULAR | Status: AC | PRN
  Administered 2023-08-13: 100 mL via INTRAVENOUS

## 2023-08-13 NOTE — ED Provider Notes (Signed)
 [facility] Provider Note    Event Date/Time   First MD Initiated Contact with Patient 08/13/23 1021     (approximate)   History   Abdominal Pain   HPI  Taylor Austin is a 83 y.o. male with history of CHF CKD COPD CAD diabetes, diverticulitis who presents with complaints of left lower quadrant pain which has been ongoing for about a week, he does report constipation as well     Physical Exam   Triage Vital Signs: ED Triage Vitals  Encounter Vitals Group     BP 08/13/23 0932 (!) 130/102     Girls Systolic BP Percentile --      Girls Diastolic BP Percentile --      Boys Systolic BP Percentile --      Boys Diastolic BP Percentile --      Pulse Rate 08/13/23 0932 (!) 52     Resp 08/13/23 0932 16     Temp 08/13/23 0932 98.4 F (36.9 C)     Temp Source 08/13/23 0932 Oral     SpO2 08/13/23 0932 97 %     Weight 08/13/23 0931 113.4 kg (250 lb)     Height 08/13/23 0931 1.651 m (5' 5)     Head Circumference --      Peak Flow --      Pain Score 08/13/23 0931 10     Pain Loc --      Pain Education --      Exclude from Growth Chart --     Most recent vital signs: Vitals:   08/13/23 0932 08/13/23 1105  BP: (!) 130/102   Pulse: (!) 52   Resp: 16   Temp: 98.4 F (36.9 C)   SpO2: 97% 97%     General: Awake, no distress.  CV:  Good peripheral perfusion.  Resp:  Normal effort.  Abd:  No distention.  Tenderness palpation of the left lower quadrant, no CVA tenderness Other:     ED Results / Procedures / Treatments   Labs (all labs ordered are listed, but only abnormal results are displayed) Labs Reviewed  COMPREHENSIVE METABOLIC PANEL WITH GFR - Abnormal; Notable for the following components:      Result Value   Glucose, Bld 125 (*)    Total Protein 6.4 (*)    Albumin 3.4 (*)    All other components within normal limits  CBC - Abnormal; Notable for the following components:   RBC 3.54 (*)    Hemoglobin 8.4 (*)    HCT 28.4 (*)     MCH 23.7 (*)    MCHC 29.6 (*)    RDW 20.7 (*)    All other components within normal limits  URINALYSIS, ROUTINE W REFLEX MICROSCOPIC - Abnormal; Notable for the following components:   Color, Urine YELLOW (*)    APPearance CLEAR (*)    All other components within normal limits  LIPASE, BLOOD     EKG     RADIOLOGY CT viewed and interpreted by me, consistent with diverticulitis, pending radiology read for further evaluation    PROCEDURES:  Critical Care performed:   Procedures   MEDICATIONS ORDERED IN ED: Medications  amoxicillin -clavulanate (AUGMENTIN ) 875-125 MG per tablet 1 tablet (has no administration in time range)  traMADol  (ULTRAM ) tablet 50 mg (has no administration in time range)  ondansetron  (ZOFRAN ) injection 4 mg (4 mg Intravenous Given 08/13/23 1108)  morphine  (PF) 4 MG/ML injection 4 mg (4 mg Intravenous Given 08/13/23  1110)  iohexol  (OMNIPAQUE ) 300 MG/ML solution 100 mL (100 mLs Intravenous Contrast Given 08/13/23 1128)     IMPRESSION / MDM / ASSESSMENT AND PLAN / ED COURSE  I reviewed the triage vital signs and the nursing notes. Patient's presentation is most consistent with acute presentation with potential threat to life or bodily function.  Patient presents with left lower quadrant abdominal pain as detailed above, differential includes diverticulitis, diverticular abscess, ureterolithiasis, UTI  Will treat with IV morphine , IV Zofran , obtain labs, CT abdomen pelvis and reevaluate  Lab work is overall reassuring, patient has chronic anemia.  White blood cell count is normal.  Pending CT scan  CT scan demonstrates acute diverticulitis, no other acute abnormalities noted.  Discussed with patient admission versus outpatient management.  He feels comfortable with outpatient antibiotics, he will return if any worsening, he and his wife agree with this plan.      FINAL CLINICAL IMPRESSION(S) / ED DIAGNOSES   Final diagnoses:  Diverticulitis     Rx /  DC Orders   ED Discharge Orders          Ordered    amoxicillin -clavulanate (AUGMENTIN ) 875-125 MG tablet  2 times daily        08/13/23 1325    traMADol  (ULTRAM ) 50 MG tablet  Every 6 hours PRN        08/13/23 1325             Note:  This document was prepared using Dragon voice recognition software and may include unintentional dictation errors.   Arlander Charleston, MD 08/13/23 [phone]

## 2023-08-13 NOTE — ED Triage Notes (Signed)
 Pt states that he has been hurting for a week,llq pain, reports ongoing constipation, does report hx of diverticulitis, thinks he may have had a small bm yesterday

## 2023-08-14 ENCOUNTER — Ambulatory Visit: Admitting: Physical Therapy

## 2023-08-16 ENCOUNTER — Ambulatory Visit: Admitting: Physical Therapy

## 2023-08-20 IMAGING — RF DG SWALLOWING FUNCTION
10 series · 14 of 24 positions shown · non-contrast
Comparison: None.

CLINICAL DATA: Dysphagia. Chronic cough.

EXAM:
MODIFIED BARIUM SWALLOW
TECHNIQUE: Different consistencies of barium were administered orally to the
patient by the Speech Pathologist. Imaging of the pharynx was
performed in the lateral projection. The radiologist was present in
the fluoroscopy room for this study, providing personal supervision.
FLUOROSCOPY TIME:  Fluoroscopy Time:  1.2 minutes
Radiation Exposure Index (if provided by the fluoroscopic device):
15.8 mGy
Number of Acquired Spot Images: 0

[Series 1: cp_standard · 0.17mm/px · 1 of 1 slices shown (1 of 10)]
[im 1/1]
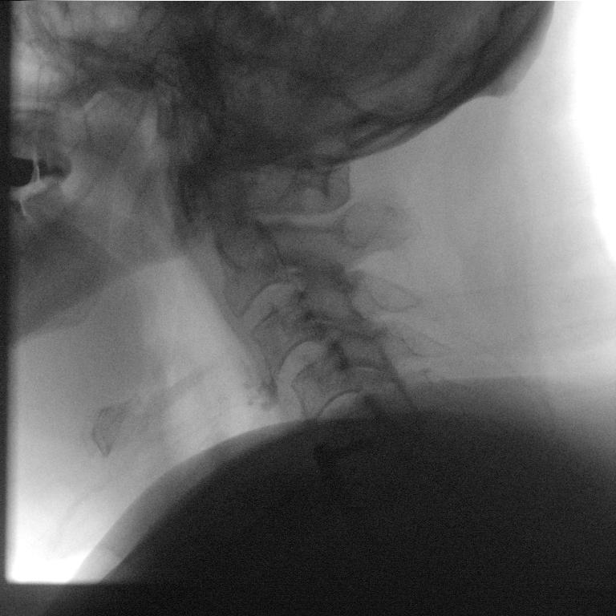

[Series 2: cp_standard · 0.17mm/px · 1 of 45 frames shown (2 of 10)]
[frame 23/45]
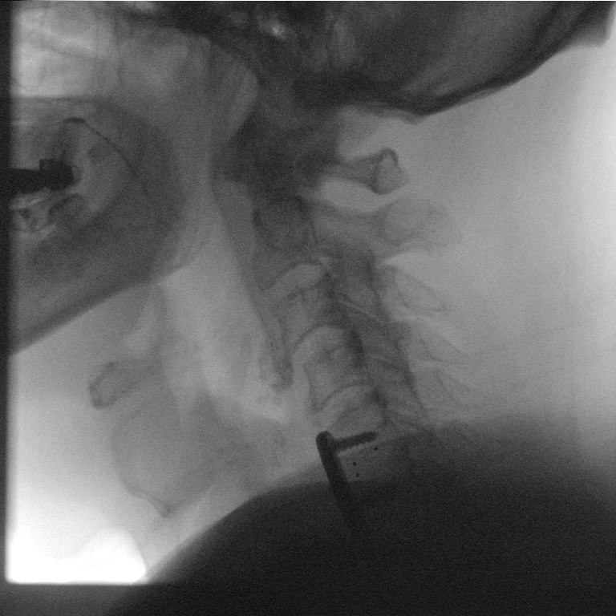

[Series 3: cp_standard · 0.17mm/px · 1 of 71 frames shown (3 of 10)]
[frame 36/71]
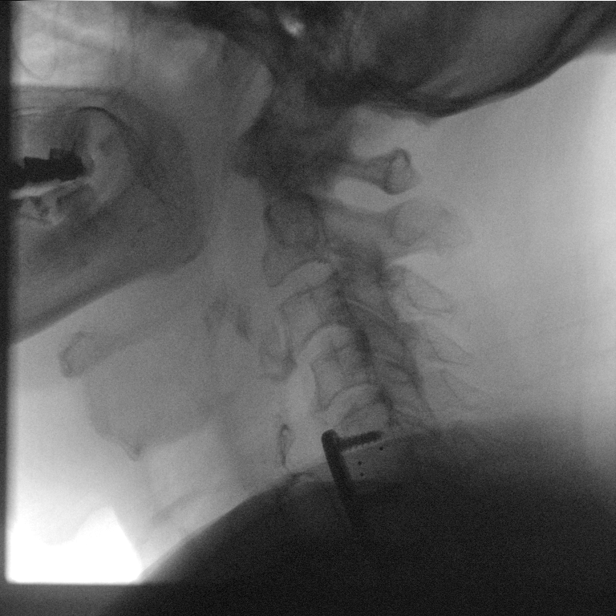

[Series 4: cp_standard · 0.17mm/px · 2 of 77 frames shown (4 of 10)]
[frame 12/77]
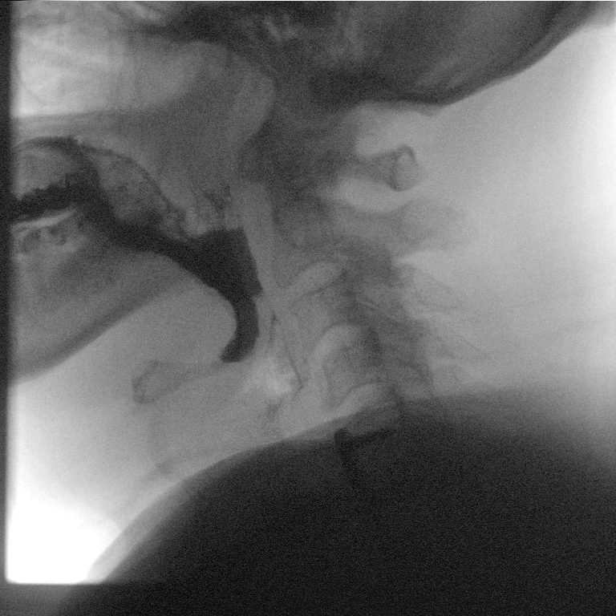
[frame 43/77]
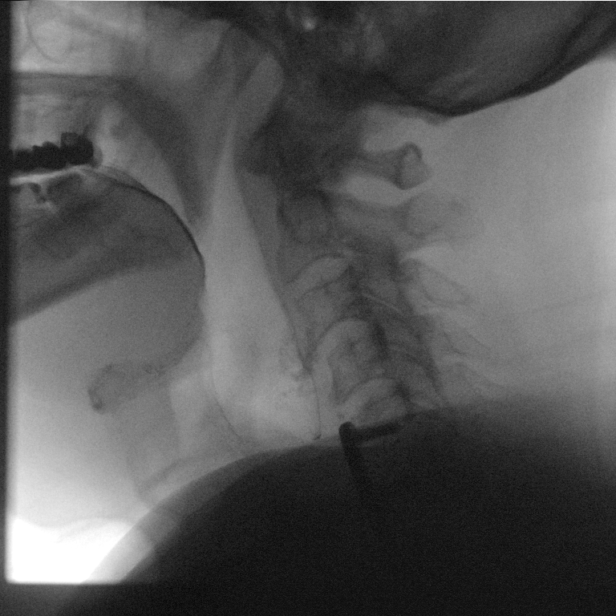

[Series 5: cp_standard · 0.17mm/px · 1 of 133 frames shown (5 of 10)]
[frame 67/133]
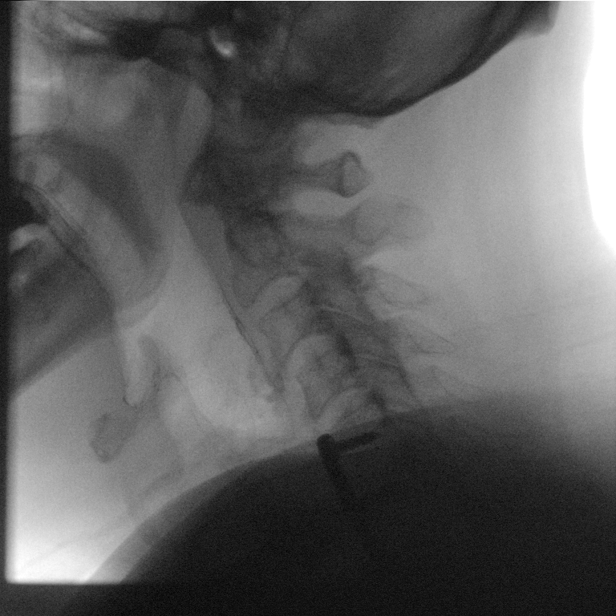

[Series 6: cp_standard · 0.17mm/px · 2 of 43 frames shown (6 of 10)]
[frame 7/43]
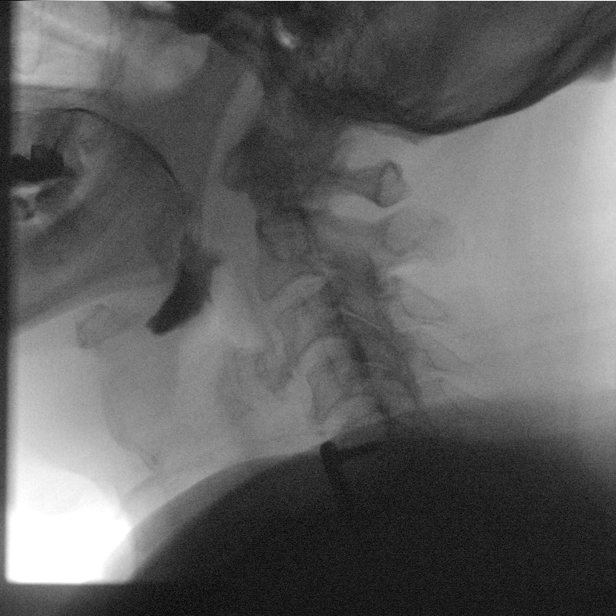
[frame 27/43]
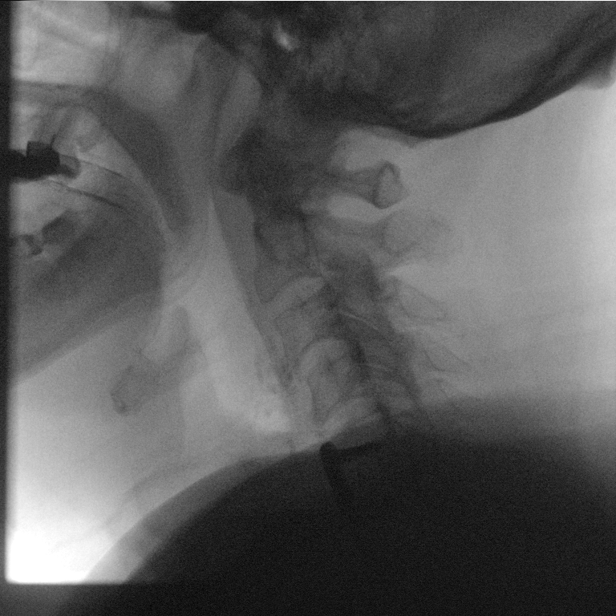

[Series 7: cp_standard · 0.17mm/px · 1 of 46 frames shown (7 of 10)]
[frame 16/46]
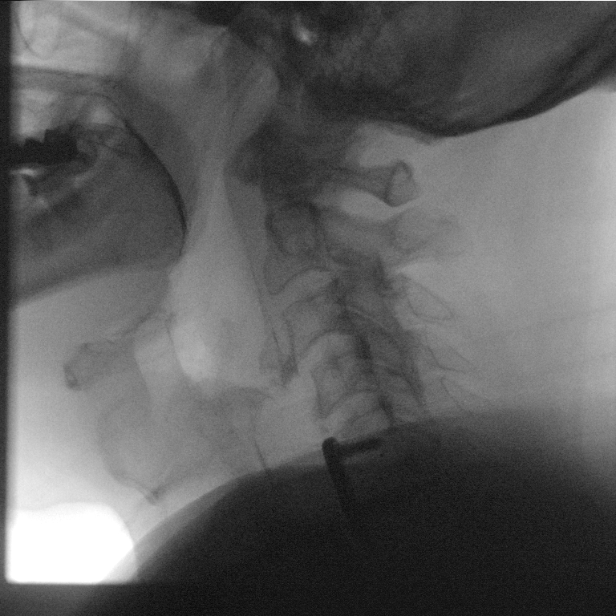

[Series 8: cp_standard · 0.17mm/px · 2 of 105 frames shown (8 of 10)]
[frame 16/105]
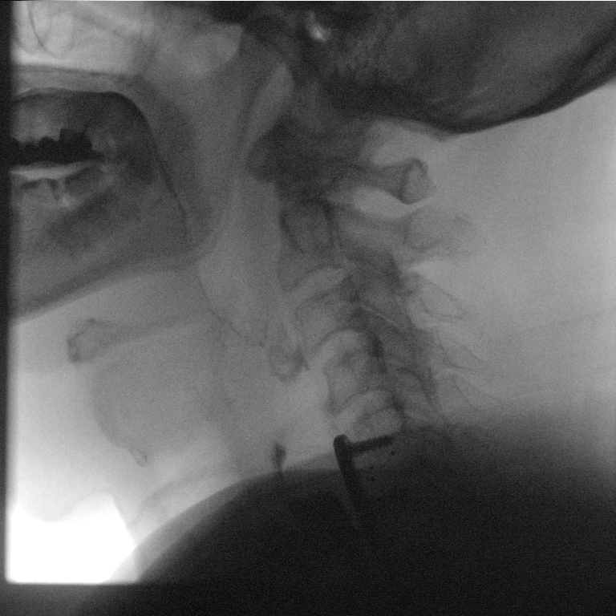
[frame 90/105]
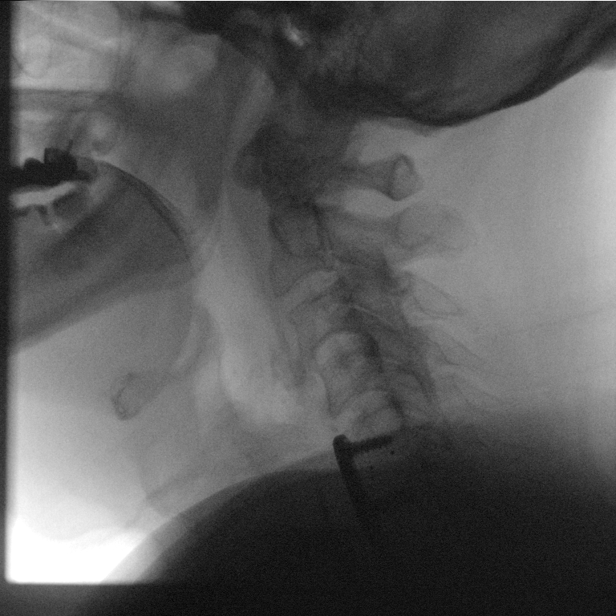

[Series 9: cp_standard · 0.17mm/px · 1 of 85 frames shown (9 of 10)]
[frame 13/85]
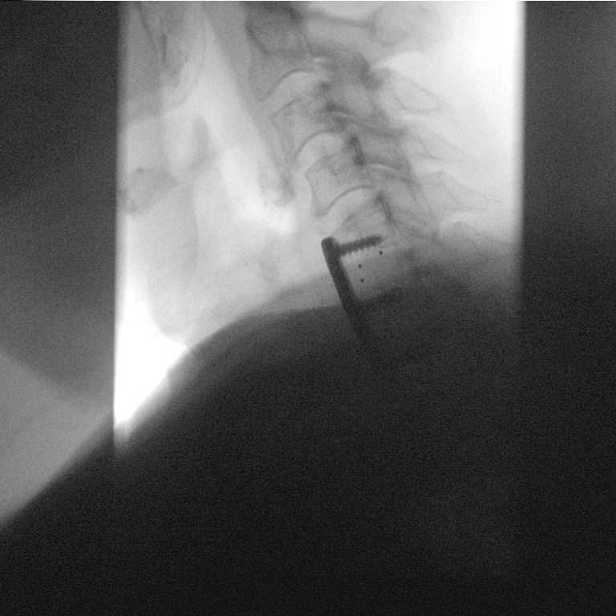

[Series 10: cp_standard · 0.17mm/px · 2 of 265 frames shown (10 of 10)]
[frame 40/265]
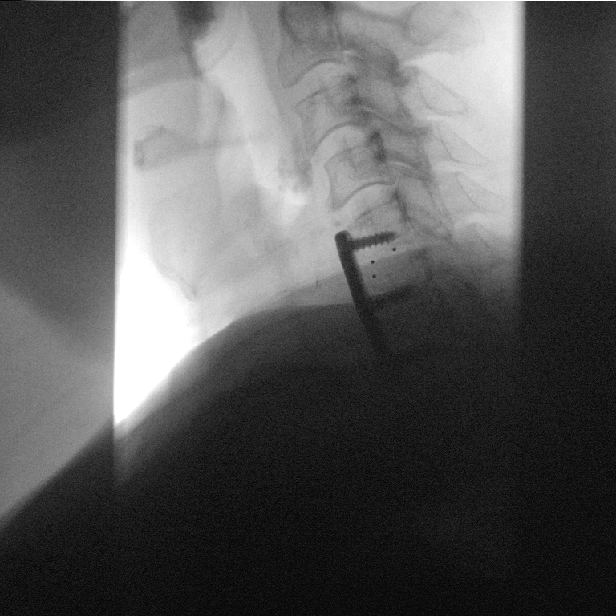
[frame 241/265]
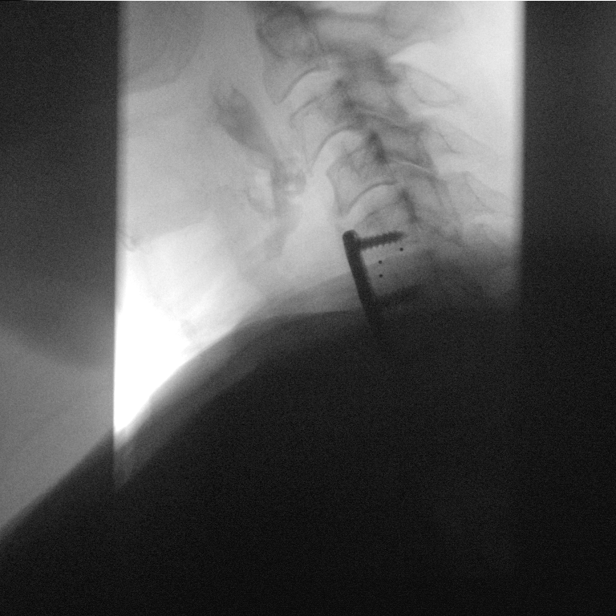

[14 of 24 positions shown; findings below may reference images not displayed]

FINDINGS: Real-time fluoroscopy of the swallowing function was performed with
a speech pathologist present.

Multiple consistencies of barium were administered which included
water, nectar, applesauce, and Liu cracker.

Normal swallow function. No laryngeal penetration or tracheal
aspiration. Prominent cricopharyngeus impression upon the cervical
esophagus.
IMPRESSION: Modified barium swallow as described above.

Please refer to the Speech Pathologists report for complete details
and recommendations.

## 2023-08-21 ENCOUNTER — Ambulatory Visit: Admitting: Physical Therapy

## 2023-08-21 DIAGNOSIS — Z8673 Personal history of transient ischemic attack (TIA), and cerebral infarction without residual deficits: Secondary | ICD-10-CM | POA: Diagnosis not present

## 2023-08-21 DIAGNOSIS — E1140 Type 2 diabetes mellitus with diabetic neuropathy, unspecified: Secondary | ICD-10-CM | POA: Diagnosis not present

## 2023-08-21 DIAGNOSIS — Z7901 Long term (current) use of anticoagulants: Secondary | ICD-10-CM | POA: Diagnosis not present

## 2023-08-21 DIAGNOSIS — Z794 Long term (current) use of insulin: Secondary | ICD-10-CM | POA: Diagnosis not present

## 2023-08-21 DIAGNOSIS — D509 Iron deficiency anemia, unspecified: Secondary | ICD-10-CM | POA: Diagnosis not present

## 2023-08-23 ENCOUNTER — Ambulatory Visit: Admitting: Physical Therapy

## 2023-08-26 DIAGNOSIS — E66813 Obesity, class 3: Secondary | ICD-10-CM | POA: Diagnosis not present

## 2023-08-26 DIAGNOSIS — Z1331 Encounter for screening for depression: Secondary | ICD-10-CM | POA: Diagnosis not present

## 2023-08-26 DIAGNOSIS — E1169 Type 2 diabetes mellitus with other specified complication: Secondary | ICD-10-CM | POA: Diagnosis not present

## 2023-08-26 DIAGNOSIS — Z794 Long term (current) use of insulin: Secondary | ICD-10-CM | POA: Diagnosis not present

## 2023-08-26 DIAGNOSIS — E785 Hyperlipidemia, unspecified: Secondary | ICD-10-CM | POA: Diagnosis not present

## 2023-08-26 DIAGNOSIS — I152 Hypertension secondary to endocrine disorders: Secondary | ICD-10-CM | POA: Diagnosis not present

## 2023-08-26 DIAGNOSIS — E1159 Type 2 diabetes mellitus with other circulatory complications: Secondary | ICD-10-CM | POA: Diagnosis not present

## 2023-08-26 DIAGNOSIS — E1140 Type 2 diabetes mellitus with diabetic neuropathy, unspecified: Secondary | ICD-10-CM | POA: Diagnosis not present

## 2023-08-27 DIAGNOSIS — G8929 Other chronic pain: Secondary | ICD-10-CM | POA: Diagnosis not present

## 2023-08-27 DIAGNOSIS — M25461 Effusion, right knee: Secondary | ICD-10-CM | POA: Diagnosis not present

## 2023-08-27 DIAGNOSIS — M170 Bilateral primary osteoarthritis of knee: Secondary | ICD-10-CM | POA: Diagnosis not present

## 2023-08-27 DIAGNOSIS — M25462 Effusion, left knee: Secondary | ICD-10-CM | POA: Diagnosis not present

## 2023-08-28 ENCOUNTER — Ambulatory Visit: Admitting: Physical Therapy

## 2023-08-30 ENCOUNTER — Ambulatory Visit: Admitting: Physical Therapy

## 2023-08-30 ENCOUNTER — Other Ambulatory Visit: Payer: Self-pay

## 2023-08-30 DIAGNOSIS — C61 Malignant neoplasm of prostate: Secondary | ICD-10-CM | POA: Diagnosis not present

## 2023-08-31 LAB — PSA: Prostate Specific Ag, Serum: <0.1 ng/mL (ref 0.0–4.0)

## 2023-09-02 ENCOUNTER — Ambulatory Visit: Payer: Self-pay

## 2023-09-03 ENCOUNTER — Ambulatory Visit: Payer: Self-pay | Admitting: Urology

## 2023-09-03 ENCOUNTER — Ambulatory Visit: Admitting: Physician Assistant

## 2023-09-04 ENCOUNTER — Ambulatory Visit: Admitting: Physical Therapy

## 2023-09-04 ENCOUNTER — Encounter: Payer: Self-pay | Admitting: Physician Assistant

## 2023-09-04 ENCOUNTER — Ambulatory Visit: Admitting: Physician Assistant

## 2023-09-04 VITALS — BP 148/64 | HR 69 | Ht 65.0 in | Wt 249.4 lb

## 2023-09-04 DIAGNOSIS — C61 Malignant neoplasm of prostate: Secondary | ICD-10-CM | POA: Diagnosis not present

## 2023-09-04 DIAGNOSIS — N3281 Overactive bladder: Secondary | ICD-10-CM | POA: Diagnosis not present

## 2023-09-04 NOTE — Progress Notes (Signed)
 09/04/2023 11:34 AM   Taylor Austin 13, 1942 [id number]  CC: Chief Complaint  Patient presents with   Medical Management of Chronic Issues   HPI: Taylor Austin is a 83 y.o. male with PMH intermediate risk prostate cancer s/p IMRT in 2019, BPH on Flomax  and finasteride , and OAB with voiding dysfunction who was failed antimuscarinics, beta 3 agonist, and PTNS who presents today for annual follow-up.  He is accompanied today by his wife.  Today he reports stable voiding symptoms including urinary incontinence and weak stream.  He wears 4-6 pads daily and they can be wet.  He feels that he can live with these at this time.  No acute concerns today.  PSA dated 08/30/2023 remains undetectable.  IPSS 25/mostly dissatisfied as below.   IPSS     Row Name 09/04/23 1100         International Prostate Symptom Score   How often have you had the sensation of not emptying your bladder? Almost always     How often have you had to urinate less than every two hours? Almost always     How often have you found you stopped and started again several times when you urinated? About half the time     How often have you found it difficult to postpone urination? More than half the time     How often have you had a weak urinary stream? Almost always     How often have you had to strain to start urination? Not at All     How many times did you typically get up at night to urinate? 3 Times     Total IPSS Score 25       Quality of Life due to urinary symptoms   If you were to spend the rest of your life with your urinary condition just the way it is now how would you feel about that? Mostly Disatisfied         PMH: Past Medical History:  Diagnosis Date   Allergy    Anemia    Arthritis    Back pain    Barrett esophagus    BPH (benign prostatic hyperplasia)    CHF (congestive heart failure) (HCC)    Chronic kidney disease    COPD (chronic obstructive pulmonary disease) (HCC)    Coronary  artery disease    Depression    Diabetes mellitus without complication (HCC)    Diverticulitis    Dysphagia    Esophageal reflux    Esophageal reflux    GERD (gastroesophageal reflux disease)    Heart murmur    Hyperlipidemia    Hypersomnia    Hypertension    Kidney stones    Low testosterone    OAB (overactive bladder)    Obesity    Orthostatic hypotension    Presence of dental bridge    2 - top   Rheumatic fever    Sleep apnea    BiPAP   Stroke (HCC) 01/2016   Vertigo    Vitamin B12 deficiency     Surgical History: Past Surgical History:  Procedure Laterality Date   BACK SURGERY     CARDIAC CATHETERIZATION  08/11/2014   Procedure: Left Heart Cath and Coronary Angiography;  Surgeon: Vinie DELENA Jude, MD;  Location: [facility] INVASIVE CV LAB;  Service: Cardiovascular;;   CARDIAC CATHETERIZATION N/A 11/17/2014   Procedure: Right Heart Cath;  Surgeon: Vinie DELENA Jude, MD;  Location: [facility] INVASIVE CV LAB;  Service:  Cardiovascular;  Laterality: N/A;   CAROTID STENT     CATARACT EXTRACTION W/PHACO Left 04/03/2016   Procedure: CATARACT EXTRACTION PHACO AND INTRAOCULAR LENS PLACEMENT (IOC) left diabetic;  Surgeon: Adine Oneil Novak, MD;  Location: [facility];  Service: Ophthalmology;  Laterality: Left;  diabetic - oral meds sleep apnea   CATARACT EXTRACTION W/PHACO Right 05/01/2016   Procedure: CATARACT EXTRACTION PHACO AND INTRAOCULAR LENS PLACEMENT (IOC) Right diabetic;  Surgeon: Adine Oneil Novak, MD;  Location: [facility];  Service: Ophthalmology;  Laterality: Right;  Diabetic oral meds sleep apnea   CERVICAL SPINE SURGERY     COLONOSCOPY     COLONOSCOPY WITH PROPOFOL  N/A 06/10/2015   Procedure: COLONOSCOPY WITH PROPOFOL ;  Surgeon: Lamar ONEIDA Holmes, MD;  Location: [facility] ENDOSCOPY;  Service: Endoscopy;  Laterality: N/A;   COLONOSCOPY WITH PROPOFOL  N/A 02/28/2023   Procedure: COLONOSCOPY WITH PROPOFOL ;  Surgeon: Jinny Carmine, MD;  Location: [facility] ENDOSCOPY;  Service:  Endoscopy;  Laterality: N/A;   CORONARY ANGIOPLASTY     ESOPHAGOGASTRODUODENOSCOPY N/A 02/28/2023   Procedure: ESOPHAGOGASTRODUODENOSCOPY (EGD);  Surgeon: Jinny Carmine, MD;  Location: [facility] ENDOSCOPY;  Service: Endoscopy;  Laterality: N/A;   ESOPHAGOGASTRODUODENOSCOPY N/A 06/06/2023   Procedure: EGD (ESOPHAGOGASTRODUODENOSCOPY);  Surgeon: Onita Elspeth Sharper, DO;  Location: [facility] ENDOSCOPY;  Service: Gastroenterology;  Laterality: N/A;   ESOPHAGOGASTRODUODENOSCOPY (EGD) WITH PROPOFOL  N/A 06/28/2017   Procedure: ESOPHAGOGASTRODUODENOSCOPY (EGD) WITH PROPOFOL ;  Surgeon: Holmes Lamar ONEIDA, MD;  Location: [facility] ENDOSCOPY;  Service: Endoscopy;  Laterality: N/A;   ESOPHAGOGASTRODUODENOSCOPY ENDOSCOPY     EYE SURGERY     LUMBAR SPINE SURGERY     POLYPECTOMY  02/28/2023   Procedure: POLYPECTOMY;  Surgeon: Jinny Carmine, MD;  Location: [facility] ENDOSCOPY;  Service: Endoscopy;;   THORACIC SPINE SURGERY     TONSILLECTOMY      Home Medications:  Allergies as of 09/04/2023       Reactions   Hydrocodone Shortness Of Breath   Oxycodone-acetaminophen  Shortness Of Breath, Swelling   Confirmed with wife- tongue swelling and SOB after oxycodone        Medication List        Accurate as of September 04, 2023 11:34 AM. If you have any questions, ask your nurse or doctor.          STOP taking these medications    albuterol  108 (90 Base) MCG/ACT inhaler Commonly known as: VENTOLIN  HFA Stopped by: Lucie Hones   DULoxetine  60 MG capsule Commonly known as: CYMBALTA  Stopped by: Lucie Hones   Eliquis 5 MG Tabs tablet Generic drug: apixaban Stopped by: Coralyn Roselli   gabapentin  100 MG capsule Commonly known as: NEURONTIN  Stopped by: Chancy Claros   glimepiride 4 MG tablet Commonly known as: AMARYL Stopped by: Purcell Jungbluth   Spiriva  Respimat 2.5 MCG/ACT Aers Generic drug: Tiotropium Bromide  Monohydrate Stopped by: Elvia Aydin   Tiotropium  Bromide Monohydrate 2.5 MCG/ACT Aers Stopped by: Areatha Kalata   traMADol  50 MG tablet Commonly known as: Ultram  Stopped by: Sulema Braid   Xultophy 100-3.6 UNIT-MG/ML Sopn Generic drug: Insulin  Degludec-Liraglutide Stopped by: Syenna Nazir       TAKE these medications    atorvastatin  40 MG tablet Commonly known as: LIPITOR Take 1 tablet by mouth daily.   calcium  carbonate 1500 (600 Ca) MG Tabs tablet Commonly known as: OSCAL Take 600 mg of elemental calcium  by mouth daily with lunch.   cyanocobalamin  1000 MCG tablet Commonly known as: VITAMIN B12 Take 1,000 mcg by mouth daily with lunch.   donepezil  10  MG tablet Commonly known as: ARICEPT  Take 10 mg by mouth every evening.   finasteride  5 MG tablet Commonly known as: PROSCAR  TAKE 1 TABLET EVERY DAY   losartan  50 MG tablet Commonly known as: COZAAR  Take 25 mg by mouth daily.   magnesium  oxide 400 MG tablet Commonly known as: MAG-OX Take 400 mg by mouth daily with lunch.   metFORMIN  500 MG 24 hr tablet Commonly known as: GLUCOPHAGE -XR Take 1,000 mg by mouth 2 (two) times daily with a meal.   Multi-Vitamins Tabs Take 1 tablet by mouth daily with lunch.   oxymetazoline  0.05 % nasal spray Commonly known as: AFRIN Place 1 spray into both nostrils 2 (two) times daily as needed for congestion.   pantoprazole  40 MG tablet Commonly known as: PROTONIX  Take 1 tablet (40 mg total) by mouth daily. What changed: Another medication with the same name was removed. Continue taking this medication, and follow the directions you see here. Changed by: Lucie Hones   pioglitazone  15 MG tablet Commonly known as: ACTOS  Take 45 mg by mouth every evening.   sildenafil  20 MG tablet Commonly known as: REVATIO  Take 20 mg by mouth 3 (three) times daily.   sildenafil  20 MG tablet Commonly known as: REVATIO  1-5 tablets as needed one hour prior to intercourse   Soliqua 100-33 UNT-MCG/ML  Sopn Generic drug: Insulin  Glargine-Lixisenatide Inject 50 Units into the skin daily. Takes once daily before breakfast   tamsulosin  0.4 MG Caps capsule Commonly known as: FLOMAX  Take 1 capsule (0.4 mg total) by mouth daily after supper.   Vitamin D-3 25 MCG (1000 UT) Caps Take 1,000 Units by mouth daily.   warfarin 3 MG tablet Commonly known as: COUMADIN Take 3 mg by mouth daily.        Allergies:  Allergies  Allergen Reactions   Hydrocodone Shortness Of Breath   Oxycodone-Acetaminophen  Shortness Of Breath and Swelling    Confirmed with wife- tongue swelling and SOB after oxycodone    Family History: Family History  Problem Relation Age of Onset   Stroke Father    Anesthesia problems Father    Anesthesia problems Mother    Prostate cancer Brother    Kidney disease Neg Hx    Bladder Cancer Neg Hx     Social History:   reports that he quit smoking about 50 years ago. His smoking use included cigarettes. He has never used smokeless tobacco. He reports current alcohol use of about 1.0 standard drink of alcohol per week. He reports that he does not use drugs.  Physical Exam: BP (!) 148/64 (BP Location: Left Arm, Patient Position: Sitting, Cuff Size: Normal)   Pulse 69   Ht 5' 5 (1.651 m)   Wt 249 lb 6.4 oz (113.1 kg)   BMI 41.50 kg/m   Constitutional:  Alert and oriented, no acute distress, nontoxic appearing HEENT: [place], AT Cardiovascular: No clubbing, cyanosis, or edema Respiratory: Normal respiratory effort, no increased work of breathing Skin: No rashes, bruises or suspicious lesions Neurologic: Grossly intact, no focal deficits, moving all 4 extremities Psychiatric: Normal mood and affect  Laboratory Data: Results for orders placed or performed in visit on 08/30/23  PSA   Collection Time: 08/30/23 10:55 AM  Result Value Ref Range   Prostate Specific Ag, Serum <0.1 0.0 - 4.0 ng/mL   Assessment & Plan:   1. Prostate cancer (HCC) (Primary) PSA remains  undetectable, will continue to monitor. - PSA; Future  2. OAB (overactive bladder) Mixed obstructive and storage  related symptoms.  He has failed multiple therapies.  We discussed consideration of Botox or InterStim, and he would like to defer this for now.  If his symptoms become more bothersome, I will get him set up with Dr. Gaston to discuss further.  I also offered him incontinence clamp versus condom catheters, but he wishes to stick with absorbent pads.  Return in about 1 year (around 09/03/2024) for Annual follow-up with Dr. Georganne, PSA prior.  Lucie Hones, PA-C  [facility] [place] [address] [place], [place] [address] [phone]

## 2023-09-06 ENCOUNTER — Ambulatory Visit: Admitting: Physical Therapy

## 2023-09-11 ENCOUNTER — Ambulatory Visit: Admitting: Physical Therapy

## 2023-09-13 ENCOUNTER — Ambulatory Visit: Admitting: Physical Therapy

## 2023-09-18 ENCOUNTER — Ambulatory Visit: Admitting: Physical Therapy

## 2023-09-18 DIAGNOSIS — Z8673 Personal history of transient ischemic attack (TIA), and cerebral infarction without residual deficits: Secondary | ICD-10-CM | POA: Diagnosis not present

## 2023-09-18 DIAGNOSIS — Z7901 Long term (current) use of anticoagulants: Secondary | ICD-10-CM | POA: Diagnosis not present

## 2023-09-20 ENCOUNTER — Ambulatory Visit: Admitting: Physical Therapy

## 2023-09-25 ENCOUNTER — Ambulatory Visit: Admitting: Physical Therapy

## 2023-09-25 DIAGNOSIS — Z8673 Personal history of transient ischemic attack (TIA), and cerebral infarction without residual deficits: Secondary | ICD-10-CM | POA: Diagnosis not present

## 2023-09-25 DIAGNOSIS — Z7901 Long term (current) use of anticoagulants: Secondary | ICD-10-CM | POA: Diagnosis not present

## 2023-09-27 ENCOUNTER — Ambulatory Visit: Admitting: Physical Therapy

## 2023-10-02 ENCOUNTER — Ambulatory Visit: Admitting: Physical Therapy

## 2023-10-04 ENCOUNTER — Ambulatory Visit: Admitting: Physical Therapy

## 2023-10-09 ENCOUNTER — Ambulatory Visit: Admitting: Physical Therapy

## 2023-10-11 ENCOUNTER — Ambulatory Visit: Admitting: Physical Therapy

## 2023-10-16 ENCOUNTER — Ambulatory Visit: Admitting: Physical Therapy

## 2023-10-18 ENCOUNTER — Ambulatory Visit: Admitting: Physical Therapy

## 2023-10-25 DIAGNOSIS — Z8673 Personal history of transient ischemic attack (TIA), and cerebral infarction without residual deficits: Secondary | ICD-10-CM | POA: Diagnosis not present

## 2023-10-25 DIAGNOSIS — Z955 Presence of coronary angioplasty implant and graft: Secondary | ICD-10-CM | POA: Diagnosis not present

## 2023-10-25 DIAGNOSIS — R001 Bradycardia, unspecified: Secondary | ICD-10-CM | POA: Diagnosis not present

## 2023-10-25 DIAGNOSIS — Z7901 Long term (current) use of anticoagulants: Secondary | ICD-10-CM | POA: Diagnosis not present

## 2023-10-25 DIAGNOSIS — I5032 Chronic diastolic (congestive) heart failure: Secondary | ICD-10-CM | POA: Diagnosis not present

## 2023-10-25 DIAGNOSIS — Z95818 Presence of other cardiac implants and grafts: Secondary | ICD-10-CM | POA: Diagnosis not present

## 2023-10-25 DIAGNOSIS — I4729 Other ventricular tachycardia: Secondary | ICD-10-CM | POA: Diagnosis not present

## 2023-10-25 DIAGNOSIS — I2510 Atherosclerotic heart disease of native coronary artery without angina pectoris: Secondary | ICD-10-CM | POA: Diagnosis not present

## 2023-10-25 DIAGNOSIS — I480 Paroxysmal atrial fibrillation: Secondary | ICD-10-CM | POA: Diagnosis not present

## 2023-11-04 DIAGNOSIS — E1159 Type 2 diabetes mellitus with other circulatory complications: Secondary | ICD-10-CM | POA: Diagnosis not present

## 2023-11-08 DIAGNOSIS — E875 Hyperkalemia: Secondary | ICD-10-CM | POA: Diagnosis not present

## 2023-11-15 DIAGNOSIS — E1159 Type 2 diabetes mellitus with other circulatory complications: Secondary | ICD-10-CM | POA: Diagnosis not present

## 2023-11-15 DIAGNOSIS — E1140 Type 2 diabetes mellitus with diabetic neuropathy, unspecified: Secondary | ICD-10-CM | POA: Diagnosis not present

## 2023-11-15 DIAGNOSIS — I152 Hypertension secondary to endocrine disorders: Secondary | ICD-10-CM | POA: Diagnosis not present

## 2023-11-15 DIAGNOSIS — Z794 Long term (current) use of insulin: Secondary | ICD-10-CM | POA: Diagnosis not present

## 2023-11-15 DIAGNOSIS — E1165 Type 2 diabetes mellitus with hyperglycemia: Secondary | ICD-10-CM | POA: Diagnosis not present

## 2023-11-20 DIAGNOSIS — I480 Paroxysmal atrial fibrillation: Secondary | ICD-10-CM | POA: Diagnosis not present

## 2023-11-27 DIAGNOSIS — G8929 Other chronic pain: Secondary | ICD-10-CM | POA: Diagnosis not present

## 2023-11-27 DIAGNOSIS — M170 Bilateral primary osteoarthritis of knee: Secondary | ICD-10-CM | POA: Diagnosis not present

## 2023-11-27 DIAGNOSIS — M25462 Effusion, left knee: Secondary | ICD-10-CM | POA: Diagnosis not present

## 2023-11-28 DIAGNOSIS — R0602 Shortness of breath: Secondary | ICD-10-CM | POA: Diagnosis not present

## 2023-11-28 DIAGNOSIS — F03A0 Unspecified dementia, mild, without behavioral disturbance, psychotic disturbance, mood disturbance, and anxiety: Secondary | ICD-10-CM | POA: Diagnosis not present

## 2023-11-28 DIAGNOSIS — G629 Polyneuropathy, unspecified: Secondary | ICD-10-CM | POA: Diagnosis not present

## 2023-12-07 DIAGNOSIS — J449 Chronic obstructive pulmonary disease, unspecified: Secondary | ICD-10-CM | POA: Diagnosis not present

## 2023-12-07 DIAGNOSIS — E1142 Type 2 diabetes mellitus with diabetic polyneuropathy: Secondary | ICD-10-CM | POA: Diagnosis not present

## 2023-12-07 DIAGNOSIS — D649 Anemia, unspecified: Secondary | ICD-10-CM | POA: Diagnosis not present

## 2023-12-07 DIAGNOSIS — I5032 Chronic diastolic (congestive) heart failure: Secondary | ICD-10-CM | POA: Diagnosis not present

## 2023-12-07 DIAGNOSIS — F32A Depression, unspecified: Secondary | ICD-10-CM | POA: Diagnosis not present

## 2023-12-07 DIAGNOSIS — F01B3 Vascular dementia, moderate, with mood disturbance: Secondary | ICD-10-CM | POA: Diagnosis not present

## 2023-12-07 DIAGNOSIS — G319 Degenerative disease of nervous system, unspecified: Secondary | ICD-10-CM | POA: Diagnosis not present

## 2023-12-07 DIAGNOSIS — I480 Paroxysmal atrial fibrillation: Secondary | ICD-10-CM | POA: Diagnosis not present

## 2023-12-07 DIAGNOSIS — I110 Hypertensive heart disease with heart failure: Secondary | ICD-10-CM | POA: Diagnosis not present

## 2023-12-10 DIAGNOSIS — E1142 Type 2 diabetes mellitus with diabetic polyneuropathy: Secondary | ICD-10-CM | POA: Diagnosis not present

## 2023-12-10 DIAGNOSIS — F01B3 Vascular dementia, moderate, with mood disturbance: Secondary | ICD-10-CM | POA: Diagnosis not present

## 2023-12-10 DIAGNOSIS — F32A Depression, unspecified: Secondary | ICD-10-CM | POA: Diagnosis not present

## 2023-12-10 DIAGNOSIS — I110 Hypertensive heart disease with heart failure: Secondary | ICD-10-CM | POA: Diagnosis not present

## 2023-12-10 DIAGNOSIS — I5032 Chronic diastolic (congestive) heart failure: Secondary | ICD-10-CM | POA: Diagnosis not present

## 2023-12-10 DIAGNOSIS — J449 Chronic obstructive pulmonary disease, unspecified: Secondary | ICD-10-CM | POA: Diagnosis not present

## 2023-12-10 DIAGNOSIS — I480 Paroxysmal atrial fibrillation: Secondary | ICD-10-CM | POA: Diagnosis not present

## 2024-01-14 DIAGNOSIS — C61 Malignant neoplasm of prostate: Secondary | ICD-10-CM

## 2024-01-14 MED ORDER — FINASTERIDE 5 MG PO TABS: 5.0000 mg | ORAL_TABLET | Freq: Every day | ORAL | 1 refills | Status: AC

## 2024-01-20 ENCOUNTER — Other Ambulatory Visit: Payer: Self-pay

## 2024-01-20 NOTE — Telephone Encounter (Signed)
 Error

## 2024-08-31 ENCOUNTER — Other Ambulatory Visit

## 2024-09-03 ENCOUNTER — Ambulatory Visit: Admitting: Urology
